# Patient Record
Sex: Female | Born: 1975 | Race: White | Hispanic: No | Marital: Married | Smoking: Never smoker
Health system: Southern US, Community
[De-identification: ages and names within clinical notes are randomized; demographics above are authoritative.]

## PROBLEM LIST (undated history)

## (undated) DIAGNOSIS — Z9889 Other specified postprocedural states: Secondary | ICD-10-CM

## (undated) DIAGNOSIS — K219 Gastro-esophageal reflux disease without esophagitis: Secondary | ICD-10-CM

## (undated) DIAGNOSIS — Z87442 Personal history of urinary calculi: Secondary | ICD-10-CM

## (undated) DIAGNOSIS — R112 Nausea with vomiting, unspecified: Secondary | ICD-10-CM

## (undated) DIAGNOSIS — T8859XA Other complications of anesthesia, initial encounter: Secondary | ICD-10-CM

## (undated) DIAGNOSIS — I10 Essential (primary) hypertension: Secondary | ICD-10-CM

## (undated) HISTORY — PX: BREAST REDUCTION WITH MASTOPEXY: SHX6465

## (undated) HISTORY — PX: CERVICAL CONE BIOPSY: SUR198

## (undated) HISTORY — PX: KIDNEY STONE SURGERY: SHX686

## (undated) HISTORY — PX: BUNIONECTOMY: SHX129

---
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
## 2016-07-24 ENCOUNTER — Other Ambulatory Visit: Payer: Self-pay | Admitting: Family Medicine

## 2016-07-24 DIAGNOSIS — R1013 Epigastric pain: Secondary | ICD-10-CM

## 2016-07-28 ENCOUNTER — Ambulatory Visit
Admission: RE | Admit: 2016-07-28 | Discharge: 2016-07-28 | Disposition: A | Discharge status: Home / Self Care | Payer: BLUE CROSS/BLUE SHIELD | Source: Ambulatory Visit | Attending: Family Medicine | Admitting: Family Medicine

## 2016-07-28 DIAGNOSIS — R1013 Epigastric pain: Secondary | ICD-10-CM

## 2016-07-28 DIAGNOSIS — K7689 Other specified diseases of liver: Secondary | ICD-10-CM | POA: Insufficient documentation

## 2017-08-02 ENCOUNTER — Other Ambulatory Visit: Payer: Self-pay | Admitting: Family Medicine

## 2017-08-02 DIAGNOSIS — Z1231 Encounter for screening mammogram for malignant neoplasm of breast: Secondary | ICD-10-CM

## 2017-08-28 ENCOUNTER — Ambulatory Visit
Admission: RE | Admit: 2017-08-28 | Discharge: 2017-08-28 | Disposition: A | Discharge status: Home / Self Care | Payer: BLUE CROSS/BLUE SHIELD | Source: Ambulatory Visit | Attending: Family Medicine | Admitting: Family Medicine

## 2017-08-28 ENCOUNTER — Encounter (INDEPENDENT_AMBULATORY_CARE_PROVIDER_SITE_OTHER): Payer: Self-pay

## 2017-08-28 ENCOUNTER — Other Ambulatory Visit: Payer: Self-pay | Admitting: Family Medicine

## 2017-08-28 DIAGNOSIS — Z1231 Encounter for screening mammogram for malignant neoplasm of breast: Secondary | ICD-10-CM | POA: Diagnosis present

## 2018-02-15 ENCOUNTER — Other Ambulatory Visit: Payer: Self-pay | Admitting: Certified Nurse Midwife

## 2018-02-15 DIAGNOSIS — Z369 Encounter for antenatal screening, unspecified: Secondary | ICD-10-CM

## 2018-03-11 ENCOUNTER — Ambulatory Visit
Admission: RE | Admit: 2018-03-11 | Discharge: 2018-03-11 | Disposition: A | Discharge status: Home / Self Care | Payer: BLUE CROSS/BLUE SHIELD | Source: Ambulatory Visit | Attending: Obstetrics & Gynecology | Admitting: Obstetrics & Gynecology

## 2018-03-11 ENCOUNTER — Other Ambulatory Visit: Payer: Self-pay | Admitting: *Deleted

## 2018-03-11 ENCOUNTER — Ambulatory Visit
Admission: RE | Admit: 2018-03-11 | Discharge: 2018-03-11 | Disposition: A | Discharge status: Home / Self Care | Payer: BLUE CROSS/BLUE SHIELD | Source: Ambulatory Visit | Attending: Certified Nurse Midwife | Admitting: Certified Nurse Midwife

## 2018-03-11 DIAGNOSIS — O021 Missed abortion: Secondary | ICD-10-CM | POA: Insufficient documentation

## 2018-03-11 DIAGNOSIS — O09511 Supervision of elderly primigravida, first trimester: Secondary | ICD-10-CM

## 2018-03-11 DIAGNOSIS — Z369 Encounter for antenatal screening, unspecified: Secondary | ICD-10-CM

## 2018-03-11 HISTORY — DX: Essential (primary) hypertension: I10

## 2018-03-12 ENCOUNTER — Ambulatory Visit: Payer: BLUE CROSS/BLUE SHIELD | Admitting: Anesthesiology

## 2018-03-12 ENCOUNTER — Other Ambulatory Visit: Payer: Self-pay

## 2018-03-12 ENCOUNTER — Ambulatory Visit
Admission: RE | Admit: 2018-03-12 | Discharge: 2018-03-12 | Disposition: A | Discharge status: Home / Self Care | Payer: BLUE CROSS/BLUE SHIELD | Source: Ambulatory Visit | Attending: Obstetrics and Gynecology | Admitting: Obstetrics and Gynecology

## 2018-03-12 ENCOUNTER — Encounter: Payer: Self-pay | Admitting: Anesthesiology

## 2018-03-12 ENCOUNTER — Encounter
Admission: RE | Disposition: A | Discharge status: Home / Self Care | Payer: Self-pay | Source: Ambulatory Visit | Attending: Obstetrics and Gynecology

## 2018-03-12 DIAGNOSIS — I10 Essential (primary) hypertension: Secondary | ICD-10-CM | POA: Insufficient documentation

## 2018-03-12 DIAGNOSIS — Z79899 Other long term (current) drug therapy: Secondary | ICD-10-CM | POA: Diagnosis not present

## 2018-03-12 DIAGNOSIS — F909 Attention-deficit hyperactivity disorder, unspecified type: Secondary | ICD-10-CM | POA: Insufficient documentation

## 2018-03-12 DIAGNOSIS — O021 Missed abortion: Secondary | ICD-10-CM | POA: Diagnosis present

## 2018-03-12 HISTORY — PX: DILATION AND EVACUATION: SHX1459

## 2018-03-12 LAB — BASIC METABOLIC PANEL
Anion gap: 11 (ref 5–15)
BUN: 22 mg/dL — ABNORMAL HIGH (ref 6–20)
CALCIUM: 9.1 mg/dL (ref 8.9–10.3)
CO2: 20 mmol/L — AB (ref 22–32)
CREATININE: 0.7 mg/dL (ref 0.44–1.00)
Chloride: 104 mmol/L (ref 101–111)
GFR calc Af Amer: >60 mL/min (ref >60)
GLUCOSE: 86 mg/dL (ref 65–99)
Potassium: 4.4 mmol/L (ref 3.5–5.1)
Sodium: 135 mmol/L (ref 135–145)

## 2018-03-12 LAB — CBC
HEMATOCRIT: 35.5 % (ref 35.0–47.0)
Hemoglobin: 12.3 g/dL (ref 12.0–16.0)
MCH: 33.2 pg (ref 26.0–34.0)
MCHC: 34.5 g/dL (ref 32.0–36.0)
MCV: 96.2 fL (ref 80.0–100.0)
Platelets: 253 10*3/uL (ref 150–440)
RBC: 3.69 MIL/uL — ABNORMAL LOW (ref 3.80–5.20)
RDW: 12.1 % (ref 11.5–14.5)
WBC: 6.3 10*3/uL (ref 3.6–11.0)

## 2018-03-12 LAB — TYPE AND SCREEN
ABO/RH(D): O NEG
Antibody Screen: NEGATIVE

## 2018-03-12 SURGERY — DILATION AND EVACUATION, UTERUS
Anesthesia: General | Site: Vagina | Wound class: Clean Contaminated

## 2018-03-12 MED ORDER — PROPOFOL 10 MG/ML IV BOLUS
INTRAVENOUS | Status: AC
  Filled 2018-03-12: qty 20

## 2018-03-12 MED ORDER — LIDOCAINE HCL (CARDIAC) PF 100 MG/5ML IV SOSY
PREFILLED_SYRINGE | INTRAVENOUS | Status: DC | PRN
  Administered 2018-03-12: 40 mg via INTRAVENOUS

## 2018-03-12 MED ORDER — ONDANSETRON HCL 4 MG/2ML IJ SOLN: 4.0000 mg | Freq: Once | INTRAMUSCULAR | Status: DC | PRN

## 2018-03-12 MED ORDER — ONDANSETRON 4 MG PO TBDP: 4.0000 mg | ORAL_TABLET | Freq: Four times a day (QID) | ORAL | 0 refills | Status: DC | PRN

## 2018-03-12 MED ORDER — FENTANYL CITRATE (PF) 100 MCG/2ML IJ SOLN: 25.0000 ug | INTRAMUSCULAR | Status: DC | PRN

## 2018-03-12 MED ORDER — GLYCOPYRROLATE 0.2 MG/ML IJ SOLN
INTRAMUSCULAR | Status: DC | PRN
  Administered 2018-03-12: 0.2 mg via INTRAVENOUS

## 2018-03-12 MED ORDER — LACTATED RINGERS IV SOLN
INTRAVENOUS | Status: DC
  Administered 2018-03-12: 16:00:00 via INTRAVENOUS

## 2018-03-12 MED ORDER — FENTANYL CITRATE (PF) 100 MCG/2ML IJ SOLN
INTRAMUSCULAR | Status: DC | PRN
  Administered 2018-03-12 (×4): 25 ug via INTRAVENOUS

## 2018-03-12 MED ORDER — SCOPOLAMINE 1 MG/3DAYS TD PT72
MEDICATED_PATCH | TRANSDERMAL | Status: AC
  Filled 2018-03-12: qty 1

## 2018-03-12 MED ORDER — IBUPROFEN 800 MG PO TABS: 800.0000 mg | ORAL_TABLET | Freq: Three times a day (TID) | ORAL | 1 refills | Status: DC | PRN

## 2018-03-12 MED ORDER — DOXYCYCLINE HYCLATE 100 MG PO TABS
200.0000 mg | ORAL_TABLET | Freq: Once | ORAL | Status: AC
  Administered 2018-03-12: 100 mg via ORAL
  Filled 2018-03-12: qty 2

## 2018-03-12 MED ORDER — PROPOFOL 10 MG/ML IV BOLUS
INTRAVENOUS | Status: DC | PRN
  Administered 2018-03-12: 50 mg via INTRAVENOUS
  Administered 2018-03-12: 80 mg via INTRAVENOUS

## 2018-03-12 MED ORDER — MIDAZOLAM HCL 2 MG/2ML IJ SOLN
INTRAMUSCULAR | Status: AC
  Filled 2018-03-12: qty 2

## 2018-03-12 MED ORDER — FAMOTIDINE 20 MG PO TABS
20.0000 mg | ORAL_TABLET | Freq: Once | ORAL | Status: AC
  Administered 2018-03-12: 20 mg via ORAL

## 2018-03-12 MED ORDER — MIDAZOLAM HCL 2 MG/2ML IJ SOLN
INTRAMUSCULAR | Status: DC | PRN
  Administered 2018-03-12: 2 mg via INTRAVENOUS

## 2018-03-12 MED ORDER — FAMOTIDINE 20 MG PO TABS
ORAL_TABLET | ORAL | Status: AC
  Administered 2018-03-12: 20 mg via ORAL
  Filled 2018-03-12: qty 1

## 2018-03-12 MED ORDER — RHO D IMMUNE GLOBULIN 1500 UNIT/2ML IJ SOSY
300.0000 ug | PREFILLED_SYRINGE | Freq: Once | INTRAMUSCULAR | Status: AC
  Administered 2018-03-12: 300 ug via INTRAMUSCULAR
  Filled 2018-03-12: qty 2

## 2018-03-12 MED ORDER — ONDANSETRON HCL 4 MG/2ML IJ SOLN
INTRAMUSCULAR | Status: DC | PRN
  Administered 2018-03-12: 4 mg via INTRAVENOUS

## 2018-03-12 MED ORDER — DOCUSATE SODIUM 100 MG PO CAPS: 100.0000 mg | ORAL_CAPSULE | Freq: Two times a day (BID) | ORAL | 0 refills | Status: AC

## 2018-03-12 MED ORDER — SCOPOLAMINE 1 MG/3DAYS TD PT72: 1.0000 | MEDICATED_PATCH | Freq: Once | TRANSDERMAL | Status: DC

## 2018-03-12 SURGICAL SUPPLY — 20 items
CATH ROBINSON RED A/P 16FR (CATHETERS) ×3 IMPLANT
FILTER UTR ASPR SPEC (MISCELLANEOUS) ×1 IMPLANT
FLTR UTR ASPR SPEC (MISCELLANEOUS) ×3
GLOVE BIO SURGEON STRL SZ7 (GLOVE) ×3 IMPLANT
GLOVE INDICATOR 7.5 STRL GRN (GLOVE) ×3 IMPLANT
GOWN STRL REUS W/ TWL LRG LVL3 (GOWN DISPOSABLE) ×2 IMPLANT
GOWN STRL REUS W/TWL LRG LVL3 (GOWN DISPOSABLE) ×4
KIT BERKELEY 1ST TRIMESTER 3/8 (MISCELLANEOUS) ×3 IMPLANT
KIT TURNOVER CYSTO (KITS) ×3 IMPLANT
PACK DNC HYST (MISCELLANEOUS) ×3 IMPLANT
PAD OB MATERNITY 4.3X12.25 (PERSONAL CARE ITEMS) ×3 IMPLANT
PAD PREP 24X41 OB/GYN DISP (PERSONAL CARE ITEMS) ×3 IMPLANT
SET BERKELEY SUCTION TUBING (SUCTIONS) ×3 IMPLANT
TOWEL OR 17X26 4PK STRL BLUE (TOWEL DISPOSABLE) ×3 IMPLANT
VACURETTE 10 RIGID CVD (CANNULA) IMPLANT
VACURETTE 6 ASPIR F TIP BERK (CANNULA) IMPLANT
VACURETTE 7MM F TIP (CANNULA)
VACURETTE 7MM F TIP STRL (CANNULA) IMPLANT
VACURETTE 8 RIGID CVD (CANNULA) ×3 IMPLANT
VACURETTE 8MM F TIP (MISCELLANEOUS) ×3 IMPLANT

## 2018-03-12 NOTE — Interval H&P Note (Signed)
History and Physical Interval Note:  03/12/2018 5:53 PM  Beth Wells  has presented today for surgery, with the diagnosis of spontaneous miscarriage  The various methods of treatment have been discussed with the patient and family. After consideration of risks, benefits and other options for treatment, the patient has consented to  Procedure(s): DILATATION AND EVACUATION (N/A) as a surgical intervention .  The patient's history has been reviewed, patient examined, no change in status, stable for surgery.  I have reviewed the patient's chart and labs.  Questions were answered to the patient's satisfaction.    We will send off products of conception for chromosome analysis   [name]

## 2018-03-12 NOTE — Op Note (Addendum)
Operative Report Suction Dilation and Curettage   Indications: Missed abortion   Pre-operative Diagnosis:  - Missed abortion at 9 weeks - Rh negative  Post-operative Diagnosis: same.  Procedure: 1. Suction D&C  Surgeon: [name]   Assistant(s):  None  Anesthesia: General LMA anesthesia  Estimated Blood Loss:  188m         Intraoperative medications: none         Total IV Fluids: 6086m Urine Output: 0 ml         Specimens: Products of conception, with SNP microarray chromosome analysis by "[facility]" at [facility]         Complications:  None; patient tolerated the procedure well.         Disposition: PACU - hemodynamically stable.         Condition: stable  Findings: Uterus measuring 8 weeks; normal cervix, vagina, perineum.   Indication for procedure/Consents: 4012.o. G1P000 at 11 weeks by LMP and 9 weeks by fetal pole  here for scheduled surgery for spontaneous missed abortion. Risks of surgery were discussed with the patient including but not limited to: bleeding which may require transfusion; infection which may require antibiotics; injury to uterus or surrounding organs; intrauterine scarring which may impair future fertility; need for additional procedures including laparotomy or laparoscopy; and other postoperative/anesthesia complications. Written informed consent was obtained.    Procedure Details:   The patient was then taken to the operating room where general anesthesia was administered and was found to be adequate.  After a formal and adequate timeout was performed, she was placed in the dorsal lithotomy position and examined with the above findings. She was then prepped and draped in the sterile manner.   Her bladder was catheterized for an estimated amount of clear, yellow urine. A speculum was then placed in the patient's vagina and a single tooth tenaculum was applied to the anterior lip of the cervix.    No uterine sounding was performed on this  pregnant uterus. Her cervix was serially dilated to accommodate a 8 sized rigid suction curette.  A sharp curettage was then performed until there was a gritty texture in all four quadrants.  The tenaculum was removed from the anterior lip of the cervix and the vaginal speculum was removed after noting good hemostasis. The patient tolerated the procedure well and was taken to the recovery area awake, extubated and in stable condition.  Pathology received POC in formalin and POC were collected according to the microarray kit directions and [employer] called.  The patient will be discharged to home as per PACU criteria.  She will receive a dose of oral antibiotics prior to discharge. Routine postoperative instructions given.  She was prescribed Percocet, Ibuprofen and Colace.  She will follow up in the clinic in two weeks for postoperative evaluation.

## 2018-03-12 NOTE — Discharge Instructions (Signed)
AMBULATORY SURGERY  DISCHARGE INSTRUCTIONS   1) The drugs that you were given will stay in your system until tomorrow so for the next 24 hours you should not:  A) Drive an automobile B) Make any legal decisions C) Drink any alcoholic beverage   2) You may resume regular meals tomorrow.  Today it is better to start with liquids and gradually work up to solid foods.  You may eat anything you prefer, but it is better to start with liquids, then soup and crackers, and gradually work up to solid foods.   3) Please notify your doctor immediately if you have any unusual bleeding, trouble breathing, redness and pain at the surgery site, drainage, fever, or pain not relieved by medication.    4) Additional Instructions:        Please contact your physician with any problems or Same Day Surgery at [phone], Monday through Friday 6 am to 4 pm, or [place] at [facility] number at [phone].Dilation and Curettage or Vacuum Curettage, Care After  This sheet gives you information about how to care for yourself after your procedure. Your health care provider may also give you more specific instructions. If you have problems or questions, contact your health care provider. What can I expect after the procedure? After your procedure, it is common to have:  Mild pain or cramping.  Some vaginal bleeding or spotting.  These may last for up to 2 weeks after your procedure. Follow these instructions at home: Activity   Do not drive or use heavy machinery while taking prescription pain medicine.  Avoid driving for the first 24 hours after your procedure.  Take frequent, short walks, followed by rest periods, throughout the day. Ask your health care provider what activities are safe for you. After 1-2 days, you may be able to return to your normal activities.  Do not lift anything heavier than 10 lb (4.5 kg) until your health care provider approves.  For at least 2 weeks, or as  long as told by your health care provider, do not: ? Douche. ? Use tampons. ? Have sexual intercourse. General instructions   Take over-the-counter and prescription medicines only as told by your health care provider. This is especially important if you take blood thinning medicine.  Do not take baths, swim, or use a hot tub until your health care provider approves. Take showers instead of baths.  Wear compression stockings as told by your health care provider. These stockings help to prevent blood clots and reduce swelling in your legs.  It is your responsibility to get the results of your procedure. Ask your health care provider, or the department performing the procedure, when your results will be ready.  Keep all follow-up visits as told by your health care provider. This is important. Contact a health care provider if:  You have severe cramps that get worse or that do not get better with medicine.  You have severe abdominal pain.  You cannot drink fluids without vomiting.  You develop pain in a different area of your pelvis.  You have bad-smelling vaginal discharge.  You have a rash. Get help right away if:  You have vaginal bleeding that soaks more than one sanitary pad in 1 hour, for 2 hours in a row.  You pass large blood clots from your vagina.  You have a fever that is above 100.48F (38.0C).  Your abdomen feels very tender or hard.  You have chest pain.  You have shortness  of breath.  You cough up blood.  You feel dizzy or light-headed.  You faint.  You have pain in your neck or shoulder area. This information is not intended to replace advice given to you by your health care provider. Make sure you discuss any questions you have with your health care provider. Document Released: 09/08/2000 Document Revised: 05/10/2016 Document Reviewed: 04/13/2016 Elsevier Interactive Patient Education  [employer].

## 2018-03-12 NOTE — Anesthesia Post-op Follow-up Note (Signed)
Anesthesia QCDR form completed.        

## 2018-03-12 NOTE — Anesthesia Procedure Notes (Signed)
Procedure Name: LMA Insertion Date/Time: 03/12/2018 6:23 PM Performed by: Aline Brochure, CRNA Pre-anesthesia Checklist: Patient identified, Emergency Drugs available, Suction available and Patient being monitored Patient Re-evaluated:Patient Re-evaluated prior to induction Oxygen Delivery Method: Circle system utilized Preoxygenation: Pre-oxygenation with 100% oxygen Induction Type: IV induction Ventilation: Mask ventilation without difficulty LMA: LMA inserted LMA Size: 3.5 Number of attempts: 1 Placement Confirmation: positive ETCO2 and breath sounds checked- equal and bilateral Tube secured with: Tape Dental Injury: Teeth and Oropharynx as per pre-operative assessment

## 2018-03-12 NOTE — Progress Notes (Signed)
Correction, arrived in post-op @2000 .

## 2018-03-12 NOTE — H&P (Signed)
  Ms. Vermette is a 42 y.o. female G1P0000 here for Pre Op Consulting (consult for missed ab)   Pt story: 11wks by 7wk ultrasound, s/p new OB appointment last week with Joycelyn Schmid - hx of cHTN - Hx of ADHD, on adderall - AMA - Unplanned pregnancy on OCPs, was reconciled.   Imaging: Ultrasound with MFM today found 9wk sized CRL without fetal cardiac activity.  Labs: Blood type: O positive  Counseling:     Exam:      Vitals:   03/11/18 1531  BP: (!) 142/90   Body mass index is 18.52 kg/m.  WDWN female in NAD CV: RRR Pulm: CTAB Breast: deferred Neck:  no thyromegaly Abdomen: soft , no mass, normal active bowel sounds,  non-tender, no rebound tenderness Skin: No rashes, ulcers or skin lesions noted. No excessive hirsutism or acne noted.  Neurological: Appears alert and oriented and is a good historian. No gross abnormalities are noted. Psychological: Normal affect and mood. No signs of anxiety or depression noted.   Pelvic:  Deferred  Impression:   The encounter diagnosis was Spontaneous miscarriage.    Plan:   Spontaneous miscarriage: Causes of spontaneous miscarriage discussed with patient, including prevalence, common causes, and the expectation that this event does not increase the chance that she will miscarry again in the future. Emotional support given.  Management options discussed, including: Expectant, medical and surgical.  Pt has opted for: [facility]  Surgical management:  Consents signed today. Risks of surgery were discussed with the patient including but not limited to: bleeding which may require transfusion; infection which may require antibiotics; injury to uterus or surrounding organs; intrauterine scarring which may impair future fertility; need for additional procedures including laparotomy or laparoscopy; and other postoperative/anesthesia complications. Written informed consent was obtained.  This is a scheduled same-day  surgery. She will have a postop visit in 2 weeks to review operative findings and pathology.

## 2018-03-12 NOTE — Anesthesia Preprocedure Evaluation (Addendum)
Anesthesia Evaluation  Patient identified by MRN, date of birth, ID band Patient awake    Reviewed: Allergy & Precautions, NPO status , Patient's Chart, lab work & pertinent test results, reviewed documented beta blocker date and time   Airway Mallampati: II  TM Distance: >3 FB     Dental  (+) Chipped   Pulmonary           Cardiovascular hypertension, Pt. on medications      Neuro/Psych    GI/Hepatic   Endo/Other    Renal/GU      Musculoskeletal   Abdominal   Peds  Hematology   Anesthesia Other Findings Does have some motion sickness. Patch applied. 11 weeks. Has not been nauseated during pregnancy. LMA ok.  Reproductive/Obstetrics                            Anesthesia Physical Anesthesia Plan  ASA: II  Anesthesia Plan: General   Post-op Pain Management:    Induction: Intravenous  PONV Risk Score and Plan:   Airway Management Planned: LMA  Additional Equipment:   Intra-op Plan:   Post-operative Plan:   Informed Consent: I have reviewed the patients History and Physical, chart, labs and discussed the procedure including the risks, benefits and alternatives for the proposed anesthesia with the patient or authorized representative who has indicated his/her understanding and acceptance.     Plan Discussed with: CRNA  Anesthesia Plan Comments:        Anesthesia Quick Evaluation

## 2018-03-12 NOTE — Anesthesia Postprocedure Evaluation (Signed)
Anesthesia Post Note  Patient: Beth Wells  Procedure(s) Performed: DILATATION AND EVACUATION (N/A Vagina )  Patient location during evaluation: PACU Anesthesia Type: General Level of consciousness: awake and alert Pain management: pain level controlled Vital Signs Assessment: post-procedure vital signs reviewed and stable Respiratory status: spontaneous breathing, nonlabored ventilation, respiratory function stable and patient connected to nasal cannula oxygen Cardiovascular status: blood pressure returned to baseline and stable Postop Assessment: no apparent nausea or vomiting Anesthetic complications: no     Last Vitals:  Vitals:   03/12/18 2010 03/12/18 2020  BP: (!) 154/90 (!) 144/88  Pulse: 87 88  Resp:    Temp:  36.7 C  SpO2: 100% 100%    Last Pain:  Vitals:   03/12/18 2020  TempSrc: Temporal  PainSc: 0-No pain                 Edelyn Heidel S

## 2018-03-12 NOTE — Transfer of Care (Signed)
Immediate Anesthesia Transfer of Care Note  Patient: Beth Wells  Procedure(s) Performed: DILATATION AND EVACUATION (N/A Vagina )  Patient Location: PACU  Anesthesia Type:General  Level of Consciousness: sedated  Airway & Oxygen Therapy: Patient connected to face mask oxygen  Post-op Assessment: Post -op Vital signs reviewed and stable  Post vital signs: stable  Last Vitals:  Vitals Value Taken Time  BP 112/78 03/12/2018  7:01 PM  Temp 36.1 C 03/12/2018  7:01 PM  Pulse 83 03/12/2018  7:01 PM  Resp 12 03/12/2018  7:01 PM  SpO2 100 % 03/12/2018  7:01 PM  Vitals shown include unvalidated device data.  Last Pain:  Vitals:   03/12/18 1901  TempSrc: Temporal  PainSc:          Complications: No apparent anesthesia complications

## 2018-03-13 ENCOUNTER — Encounter: Payer: Self-pay | Admitting: Obstetrics and Gynecology

## 2018-03-13 LAB — RHOGAM INJECTION: UNIT DIVISION: 0

## 2018-03-14 LAB — SURGICAL PATHOLOGY

## 2018-03-25 ENCOUNTER — Ambulatory Visit: Payer: BLUE CROSS/BLUE SHIELD

## 2018-04-11 ENCOUNTER — Ambulatory Visit
Admission: RE | Admit: 2018-04-11 | Discharge: 2018-04-11 | Disposition: A | Discharge status: Home / Self Care | Payer: BLUE CROSS/BLUE SHIELD | Source: Ambulatory Visit | Attending: Maternal & Fetal Medicine | Admitting: Maternal & Fetal Medicine

## 2018-04-11 VITALS — BP 123/80 | HR 80 | Temp 98.0°F | Resp 18 | Wt 98.6 lb

## 2018-04-11 DIAGNOSIS — Q999 Chromosomal abnormality, unspecified: Secondary | ICD-10-CM

## 2018-04-11 NOTE — Progress Notes (Addendum)
Referring Physician:  [facility] Time of Consultation: 40 minutes  Ms. 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  This letter is a summary of our visit and may be helpful as questions arise.  The patient was seen in the [facility] on March 11, 2018 for genetic counseling and an ultrasound for advanced maternal age.  At that time, the ultrasound revealed a fetal demise which had occurred at approximately [redacted] weeks gestation.  See the genetic counseling note from that visit regarding family history.  Today we reviewed that there can be many different causes for pregnancy loss.  It is known that approximately 15% of recognized pregnancies will end in miscarriage, most often with no known cause.  Given the abnormal results of the [facility] microarray testing, we would not recommend any other additional evaluation at this time.  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 Dimaria's pregnancy.  In addition to the assessing the copy number of DNA present, this testing can determine the gender of the pregnancy as well as the parent of origin for the extra chromosome.  The results showed a female fetus and that the extra chromosome number 81 was maternal in origin. It is estimated that 90% of cases of trisomy 38 are the result of maternal nondisjunction.  To review, chromosomes are the structures inside each cell of our bodies that have all the instructions for growth and development.  There are usually 23 pairs of chromosomes in each cell, with one member of each pair coming from each parent.  This means that we all have two copies of all of our genetic instructions.   In some pregnancies, there is an extra copy of a chromosome, known as trisomy (three copies instead of the usual two).  Babies with trisomy 77 typically have a severe  combination of birth defects and profound intellectual disabilities.  Many of these babies do not survive until delivery at term and the majority of those who are born pass away during the first year of life.   There is nothing that a parent can do to make this condition happen or to prevent it, including medication use, diet, etc.  The extra chromosome most often (90% of the time) occurs due to a process known as nondisjunction, in which the two parental chromosomes fail to separate properly during the formation of egg or sperm cells.  This results in the presence of an extra copy of the chromosome being passed down from one parent and when combined with the one copy of the chromosome from the other parent, results in a total of three copies of that chromosome.  In rare families, the extra chromosome may be present attached to another chromosome, called a translocation, which can be inherited and may increase the risks to future pregnancies.  To clarify this chance for recurrence in future pregnancies, it may be helpful to have chromosome analysis on the parents of a pregnancy with a chromosome condition.  The chromosome analysis allows examination of the number and structure of the chromosomes, not just the copy number of the DNA as seen on the Anora microarray testing.    If the trisomy 26 occurred as a sporadic event, then we reviewed that the chance for a chromosome problem to occur in a future pregnancy due to this history is estimated to be approximately  1% or the maternal age related risk, whichever is higher. Given the fact that Ms. Channell is currently 42 years old, the estimated chance for a chromosome condition in a future pregnancy would be 1 in 56 in the first trimester or 1 in 80 at term. This means that the chance any baby would not have a chromosome problem is >95%.  If Ms. Dolinski were to have a chromosome translocation, then this risk would be different based upon what chromosomes were involved.   Either way, prenatal diagnosis using ultrasound, cell free fetal DNA screening, CVS and/or amniocentesis is available for any future pregnancy.  We are available to review these testing options again in detail in any future pregnancy.  Plan: Ms. Oyervides plans to speak with her husband and contact her insurance company regarding coverage for chromosome analysis on herself to rule out a balanced chromosome translocation. If she desires chromosome analysis, we are happy to arrange it here or if can be drawn with her labs at her next follow up visit.  We would suggest [facility] test number [id number], Blood chromosome analysis.  We are happy to speak with this family further to arrange for testing or answer questions.  We may be reached at [phone].   Wilburt Finlay, MS, CGC  I was immediately available and supervising. Erasmo Score, MD [facility]

## 2018-08-25 DIAGNOSIS — Z9889 Other specified postprocedural states: Secondary | ICD-10-CM

## 2018-08-25 HISTORY — DX: Other specified postprocedural states: Z98.890

## 2018-12-11 DIAGNOSIS — M79671 Pain in right foot: Secondary | ICD-10-CM | POA: Insufficient documentation

## 2018-12-24 ENCOUNTER — Encounter (HOSPITAL_COMMUNITY): Payer: Self-pay

## 2019-01-03 DIAGNOSIS — G603 Idiopathic progressive neuropathy: Secondary | ICD-10-CM | POA: Insufficient documentation

## 2019-05-30 ENCOUNTER — Other Ambulatory Visit: Payer: Self-pay

## 2019-05-30 ENCOUNTER — Encounter: Payer: Self-pay | Admitting: Emergency Medicine

## 2019-05-30 ENCOUNTER — Emergency Department: Payer: BC Managed Care – PPO

## 2019-05-30 ENCOUNTER — Emergency Department
Admission: EM | Admit: 2019-05-30 | Discharge: 2019-05-31 | Disposition: A | Discharge status: Home / Self Care | Payer: BC Managed Care – PPO | Attending: Emergency Medicine | Admitting: Emergency Medicine

## 2019-05-30 DIAGNOSIS — I10 Essential (primary) hypertension: Secondary | ICD-10-CM | POA: Insufficient documentation

## 2019-05-30 DIAGNOSIS — N83291 Other ovarian cyst, right side: Secondary | ICD-10-CM | POA: Diagnosis not present

## 2019-05-30 DIAGNOSIS — Z79899 Other long term (current) drug therapy: Secondary | ICD-10-CM | POA: Diagnosis not present

## 2019-05-30 DIAGNOSIS — R1032 Left lower quadrant pain: Secondary | ICD-10-CM | POA: Diagnosis present

## 2019-05-30 DIAGNOSIS — N83201 Unspecified ovarian cyst, right side: Secondary | ICD-10-CM

## 2019-05-30 DIAGNOSIS — R1030 Lower abdominal pain, unspecified: Secondary | ICD-10-CM

## 2019-05-30 LAB — COMPREHENSIVE METABOLIC PANEL
ALT: 15 U/L (ref 0–44)
AST: 21 U/L (ref 15–41)
Albumin: 4.5 g/dL (ref 3.5–5.0)
Alkaline Phosphatase: 44 U/L (ref 38–126)
Anion gap: 10 (ref 5–15)
BUN: 29 mg/dL — ABNORMAL HIGH (ref 6–20)
CO2: 26 mmol/L (ref 22–32)
Calcium: 9.5 mg/dL (ref 8.9–10.3)
Chloride: 101 mmol/L (ref 98–111)
Creatinine, Ser: 0.73 mg/dL (ref 0.44–1.00)
GFR calc Af Amer: >60 mL/min (ref >60)
GFR calc non Af Amer: >60 mL/min (ref >60)
Glucose, Bld: 105 mg/dL — ABNORMAL HIGH (ref 70–99)
Potassium: 4.3 mmol/L (ref 3.5–5.1)
Sodium: 137 mmol/L (ref 135–145)
Total Bilirubin: 0.7 mg/dL (ref 0.3–1.2)
Total Protein: 7.9 g/dL (ref 6.5–8.1)

## 2019-05-30 LAB — CBC
HCT: 42.5 % (ref 36.0–46.0)
Hemoglobin: 14 g/dL (ref 12.0–15.0)
MCH: 30.4 pg (ref 26.0–34.0)
MCHC: 32.9 g/dL (ref 30.0–36.0)
MCV: 92.4 fL (ref 80.0–100.0)
Platelets: 286 10*3/uL (ref 150–400)
RBC: 4.6 MIL/uL (ref 3.87–5.11)
RDW: 12.7 % (ref 11.5–15.5)
WBC: 13.8 10*3/uL — ABNORMAL HIGH (ref 4.0–10.5)
nRBC: 0 % (ref 0.0–0.2)

## 2019-05-30 LAB — URINALYSIS, COMPLETE (UACMP) WITH MICROSCOPIC
Bilirubin Urine: NEGATIVE
Glucose, UA: NEGATIVE mg/dL
Hgb urine dipstick: NEGATIVE
Ketones, ur: 20 mg/dL — AB
Nitrite: NEGATIVE
Protein, ur: NEGATIVE mg/dL
Specific Gravity, Urine: 1.029 (ref 1.005–1.030)
pH: 6 (ref 5.0–8.0)

## 2019-05-30 LAB — PREGNANCY, URINE: Preg Test, Ur: NEGATIVE

## 2019-05-30 MED ORDER — IOHEXOL 240 MG/ML SOLN
50.0000 mL | Freq: Once | INTRAMUSCULAR | Status: AC | PRN
  Administered 2019-05-30: 50 mL via ORAL

## 2019-05-30 MED ORDER — IOHEXOL 300 MG/ML  SOLN
100.0000 mL | Freq: Once | INTRAMUSCULAR | Status: AC | PRN
  Administered 2019-05-30: 21:00:00 100 mL via INTRAVENOUS

## 2019-05-30 NOTE — ED Triage Notes (Signed)
Pt arrives with no complaints of pain but does report a sudden onset of sharp LLQ pain that lasted "for awhile" PT reports a loss of consciousness after pain episode. Fall was witness and pt did not hit her head.

## 2019-05-30 NOTE — ED Provider Notes (Addendum)
[facility] Emergency Department Provider Note  Time seen: 6:15 PM  I have reviewed the triage vital signs and the nursing notes.   HISTORY  Chief Complaint Abdominal Pain   HPI Beth Wells is a 43 y.o. female with a past medical history of hypertension presents to the emergency department for left lower quadrant abdominal pain.  According to the patient around 12 PM today she had acute onset of left lower quadrant abdominal pain.  States an episode of near syncope with the pain.  Denies any vomiting denies any diarrhea.  Denies any recent dysuria or hematuria.  Last period ended approximately 1 week ago.  Denies any fever cough congestion or shortness of breath.  Continues to have left lower quad abdominal pain states mild unless it is palpated and moderate.   Past Medical History:  Diagnosis Date  . Hypertension     Patient Active Problem List   Diagnosis Date Noted  . Chromosomal abnormality     Past Surgical History:  Procedure Laterality Date  . CERVICAL CONE BIOPSY    . DILATION AND EVACUATION N/A 03/12/2018   Procedure: DILATATION AND EVACUATION;  Surgeon: [name], MD;  Location: [facility] ORS;  Service: Gynecology;  Laterality: N/A;  . KIDNEY STONE SURGERY      Prior to Admission medications   Medication Sig Start Date End Date Taking? Authorizing Provider  amphetamine-dextroamphetamine (ADDERALL XR) 20 MG 24 hr capsule Take 20 mg by mouth daily.    [provider]  ibuprofen (ADVIL,MOTRIN) 800 MG tablet Take 1 tablet (800 mg total) by mouth every 8 (eight) hours as needed for moderate pain or cramping. 03/12/18   [name], MD  labetalol (NORMODYNE) 300 MG tablet Take 300 mg by mouth 2 (two) times daily.    [provider]  lisinopril (PRINIVIL,ZESTRIL) 5 MG tablet Take 5 mg by mouth daily.    [provider]  ondansetron (ZOFRAN ODT) 4 MG disintegrating tablet Take 1 tablet (4 mg total) by mouth every 6  (six) hours as needed for nausea. Patient not taking: Reported on 04/11/2018 03/12/18   [name], MD  Prenatal Vit-Fe Fumarate-FA (PRENATAL MULTIVITAMIN) TABS tablet Take 1 tablet by mouth daily at 12 noon.    [provider]    No Known Allergies  Family History  Problem Relation Age of Onset  . Breast cancer Neg Hx     Social History Social History   Tobacco Use  . Smoking status: Never Smoker  . Smokeless tobacco: Never Used  Substance Use Topics  . Alcohol use: Not Currently  . Drug use: Not Currently    Review of Systems Constitutional: Negative for fever. Cardiovascular: Negative for chest pain. Respiratory: Negative for shortness of breath. Gastrointestinal: Left lower quadrant abdominal pain.  Negative for nausea vomiting or diarrhea Genitourinary: Negative for urinary compaints Musculoskeletal: Negative for musculoskeletal complaints Skin: Negative for skin complaints  Neurological: Negative for headache All other ROS negative  ____________________________________________   PHYSICAL EXAM:  VITAL SIGNS: ED Triage Vitals  Enc Vitals Group     BP 05/30/19 1350 128/86     Pulse Rate 05/30/19 1350 81     Resp 05/30/19 1350 16     Temp 05/30/19 1351 97.9 F (36.6 C)     Temp Source 05/30/19 1351 Oral     SpO2 05/30/19 1350 100 %     Weight 05/30/19 1351 99 lb 3.3 oz (45 kg)     Height 05/30/19 1351 5\' 2"  (  1.575 m)     Head Circumference --      Peak Flow --      Pain Score 05/30/19 1350 0     Pain Loc --      Pain Edu? --      Excl. in [place]? --    Constitutional: Alert and oriented. Well appearing and in no distress. Eyes: Normal exam ENT      Head: Normocephalic and atraumatic.      Mouth/Throat: Mucous membranes are moist. Cardiovascular: Normal rate, regular rhythm. No murmur Respiratory: Normal respiratory effort without tachypnea nor retractions. Breath sounds are clear Gastrointestinal: Soft and nontender. No distention.   Musculoskeletal: Nontender with normal range of motion in all extremities. Neurologic:  Normal speech and language. No gross focal neurologic deficits  Skin:  Skin is warm, dry and intact.  Psychiatric: Mood and affect are normal.   ____________________________________________  EKG EKG viewed and interpreted by myself shows a normal sinus rhythm at 78 bpm with a narrow QRS, normal axis, normal intervals, no concerning ST changes.  RADIOLOGY  CT is resulted showing a 5 cm right ovarian cyst.  Pelvic ultrasound recommended.  Left corpus luteal cyst.  Small amount of free fluid in the endometrial canal.  No diverticulitis.  ____________________________________________   INITIAL IMPRESSION / ASSESSMENT AND PLAN / ED COURSE  Pertinent labs & imaging results that were available during my care of the patient were reviewed by me and considered in my medical decision making (see chart for details).   Patient presents to the emergency department for left lower quadrant abdominal pain.  Differential this time would include diverticulitis, colitis, ureterolithiasis, pyelonephritis, urinary tract infection ovarian cyst or hemorrhagic cyst.  Patient's lab work shows a moderate leukocytosis of 13,000, urine is equivocal we will send a urine culture.  CT scan pending at this time.  CT scan shows 5 cm probable right ovarian cyst.  Given the patient's pain although pain is much reduced at this time we will proceed with ultrasound to further evaluate, characterize and ensure proper blood flow.  IMPRESSION:  7.3 x 4.3 x 4.2 cm cystic mass within the right ovary with low level  internal echoes. Appearance is most suggestive of endometrioma. No  evidence of torsion.   Ultrasound shows 7 cm cyst.  No evidence of torsion.  Patient states no pain at this time.  Dr. Leafy Ro has been consulted, will take the patient to the operating room next week most likely.  I discussed strict return precautions for return  of significant pain otherwise will discharge with a short course of pain medication for mild pain at home.  Patient agreeable to plan.  [name] was evaluated in Emergency Department on 05/30/2019 for the symptoms described in the history of present illness. She was evaluated in the context of the global COVID-19 pandemic, which necessitated consideration that the patient might be at risk for infection with the SARS-CoV-2 virus that causes COVID-19. Institutional protocols and algorithms that pertain to the evaluation of patients at risk for COVID-19 are in a state of rapid change based on information released by regulatory bodies including the CDC and federal and state organizations. These policies and algorithms were followed during the patient's care in the ED.  ____________________________________________   FINAL CLINICAL IMPRESSION(S) / ED DIAGNOSES  Abdominal pain Ovarian cyst   Harvest Dark, MD 05/30/19 2300    Harvest Dark, MD 05/31/19 0009

## 2019-05-31 MED ORDER — HYDROCODONE-ACETAMINOPHEN 5-325 MG PO TABS: 1.0000 | ORAL_TABLET | ORAL | 0 refills | Status: DC | PRN

## 2019-06-05 ENCOUNTER — Other Ambulatory Visit: Payer: Self-pay

## 2019-06-05 ENCOUNTER — Other Ambulatory Visit
Admission: RE | Admit: 2019-06-05 | Discharge: 2019-06-05 | Disposition: A | Discharge status: Home / Self Care | Payer: BC Managed Care – PPO | Source: Ambulatory Visit | Attending: Obstetrics and Gynecology | Admitting: Obstetrics and Gynecology

## 2019-06-05 DIAGNOSIS — Z01812 Encounter for preprocedural laboratory examination: Secondary | ICD-10-CM | POA: Diagnosis not present

## 2019-06-05 DIAGNOSIS — Z20828 Contact with and (suspected) exposure to other viral communicable diseases: Secondary | ICD-10-CM | POA: Diagnosis present

## 2019-06-05 LAB — SARS CORONAVIRUS 2 (TAT 6-24 HRS): SARS Coronavirus 2: NEGATIVE

## 2019-06-05 NOTE — H&P (Signed)
Beth Wells is a 43 y.o. female presenting with Follow-up (ER follow up, cyst on ovary, surgery consult)  History of Present Illness: -Patient is following up after her ED visit shown below. She is consulting for a ovarian cystectomy today. She is having more pain when she starts her period. - Pain now totally resolved, lasted about 15 minutes, triggering near syncope and pelvic pain  05/30/2019 ED Visit for LLQ Pain, Cyst of right ovary: -Patient presented to the ED with LLQ and an episode of near syncope  -Ultrasound showed: 7.3 x 4.3 x 4.2 cm cystic mass within the right ovary with low level internal echoes. Appearance is most suggestive of endometrioma. No evidence of torsion.   Past Medical History:  has a past medical history of Depression, Hypertension, Ovarian cyst, PONV (postoperative nausea and vomiting), and Renal calculus or stone.  Past Surgical History:  has a past surgical history that includes Lithotripsy Eswl (aug 2015); ureteroscopic stone removal (Dec 2015); Cervical cone biopsy (07/26/2014); Dilation and curettage of uterus; bunion correction (Right, 08/29/2018); bunion correction (Right, 08/29/2018); repair flexor tendon/posterior tibialis foot w/o free graft (Right, 08/29/2018); injection anesthetic agent other nerve or branch (Right, 08/29/2018); and intraoperative flouroscopy (Right, 08/29/2018). Family History: family history includes Coronary Artery Disease (Blocked arteries around heart) in her maternal grandfather and maternal grandmother; Depression in her mother; High blood pressure (Hypertension) in her father and mother; Hyperlipidemia (Elevated cholesterol) in her father; Thyroid disease in her maternal grandmother. Social History:  reports that she has never smoked. She has never used smokeless tobacco. She reports current alcohol use of about 1.0 standard drinks of alcohol per week. She reports that she does not use drugs. OB/GYN History:          OB History     Gravida  1   Para  0   Term  0   Preterm  0   AB  1   Living  0     SAB  1   TAB  0   Ectopic  0   Molar  0   Multiple  0   Live Births  0        Allergies: has No Known Allergies. Medications:  Current Outpatient Medications:  .  ascorbic acid, vitamin C, (VITAMIN C) 1000 MG tablet, Take 1 tablet (1,000 mg total) by mouth 2 (two) times daily (Patient taking differently: Take 1,000 mg by mouth once daily   ), Disp: 60 tablet, Rfl: 2 .  DULoxetine (CYMBALTA) 20 MG DR capsule, Take 2 capsules (40 mg total) by mouth once daily, Disp: 180 capsule, Rfl: 3 .  Lactobac no.41/Bifidobact no.7 (PROBIOTIC-10 ORAL), Take by mouth, Disp: , Rfl:  .  lisinopriL (ZESTRIL) 2.5 MG tablet, Take 1 tablet (2.5 mg total) by mouth once daily, Disp: 90 tablet, Rfl: 3 .  dextroamphetamine-amphetamine (ADDERALL XR) 20 MG XR capsule, Take 1 capsule (20 mg total) by mouth once daily for 29 days, Disp: 30 capsule, Rfl: 0 .  dextroamphetamine-amphetamine (ADDERALL XR) 20 MG XR capsule, Take 1 capsule (20 mg total) by mouth once daily for 29 days, Disp: 30 capsule, Rfl: 0 .  gabapentin (NEURONTIN) 300 MG capsule, Take 1 capsule (300 mg total) by mouth 2 (two) times daily for 90 days (Patient taking differently: Take 600 mg by mouth nightly   ), Disp: 180 capsule, Rfl: 3  Review of Systems: No SOB, no palpitations or chest pain, no new lower extremity edema, no nausea or vomiting or  bowel or bladder complaints. See HPI for gyn specific ROS.   Exam:   BP 112/80   Ht 154.9 cm (5\' 1" )   Wt 46.8 kg (103 lb 3.2 oz)   LMP 05/17/2019 (Exact Date)   BMI 19.50 kg/m   Constitutional:  General appearance: Well nourished, well developed female in no acute distress.  CV: RRR Pulm: CTAB  Neuro/psych:  Normal mood and affect. No gross motor deficits. Neck:  Supple, normal appearance.  Respiratory:  Normal respiratory effort, no use of accessory muscles Skin:  No visible rashes or external  lesions  Abdomen: soft, no masses, normal active bowel sounds,  +tender at LLQ, no rebound tenderness, no hernias noted  Impression:   The encounter diagnosis was Cyst of right ovary.  Plan:   1. Ovarian Cystic Mass of Right Ovary, 7 cm -Discussed with patient that due to the size of her cyst, I would recommend ovarian cystectomy (Right side per ultrasound) though expectant management with repeat tvus in 3 months and ovarian suppression between now and then is also reasonable.  - We had a preoperative discussion regarding her plans to proceed with surgical treatment of her Right Ovarian Cyst by Ovarian Cystectomy procedure.The patient and I discussed the technical aspects of the procedure including the potential for risks and complications.These include but are not limited to the risk of infection requiring post-operative antibiotics or further procedures. We talked about the risk of injury to adjacent organs including bladder, bowel, ureter, blood vessels or nerves.We talked about the need to convert to an open incision. We talked about the possible need for blood transfusion. We talked aboutpostop complications such asthromboembolic or cardiopulmonary complications. All of her questions were answered.    2. LLQ Pain -Discussed with patient birth control options to help treat with stabilizing cycle and relieving pain symptoms; she will think about this & we will give her a handout

## 2019-06-06 ENCOUNTER — Observation Stay
Admission: RE | Admit: 2019-06-06 | Discharge: 2019-06-07 | Disposition: A | Discharge status: Home / Self Care | Payer: BC Managed Care – PPO | Attending: Obstetrics and Gynecology | Admitting: Obstetrics and Gynecology

## 2019-06-06 ENCOUNTER — Encounter: Payer: Self-pay | Admitting: Anesthesiology

## 2019-06-06 DIAGNOSIS — I10 Essential (primary) hypertension: Secondary | ICD-10-CM | POA: Insufficient documentation

## 2019-06-06 DIAGNOSIS — N801 Endometriosis of ovary: Secondary | ICD-10-CM | POA: Diagnosis not present

## 2019-06-06 DIAGNOSIS — Z79899 Other long term (current) drug therapy: Secondary | ICD-10-CM | POA: Insufficient documentation

## 2019-06-06 DIAGNOSIS — F329 Major depressive disorder, single episode, unspecified: Secondary | ICD-10-CM | POA: Diagnosis not present

## 2019-06-06 DIAGNOSIS — N803 Endometriosis of pelvic peritoneum: Secondary | ICD-10-CM | POA: Insufficient documentation

## 2019-06-06 DIAGNOSIS — K660 Peritoneal adhesions (postprocedural) (postinfection): Secondary | ICD-10-CM | POA: Diagnosis not present

## 2019-06-06 DIAGNOSIS — N83201 Unspecified ovarian cyst, right side: Secondary | ICD-10-CM | POA: Diagnosis present

## 2019-06-06 DIAGNOSIS — N83511 Torsion of right ovary and ovarian pedicle: Secondary | ICD-10-CM | POA: Diagnosis present

## 2019-06-06 LAB — CBC
HCT: 39.2 % (ref 36.0–46.0)
Hemoglobin: 13.1 g/dL (ref 12.0–15.0)
MCH: 30.6 pg (ref 26.0–34.0)
MCHC: 33.4 g/dL (ref 30.0–36.0)
MCV: 91.6 fL (ref 80.0–100.0)
Platelets: 276 10*3/uL (ref 150–400)
RBC: 4.28 MIL/uL (ref 3.87–5.11)
RDW: 12.5 % (ref 11.5–15.5)
WBC: 6.2 10*3/uL (ref 4.0–10.5)
nRBC: 0 % (ref 0.0–0.2)

## 2019-06-06 LAB — BASIC METABOLIC PANEL
Anion gap: 5 (ref 5–15)
BUN: 29 mg/dL — ABNORMAL HIGH (ref 6–20)
CO2: 25 mmol/L (ref 22–32)
Calcium: 9 mg/dL (ref 8.9–10.3)
Chloride: 106 mmol/L (ref 98–111)
Creatinine, Ser: 0.77 mg/dL (ref 0.44–1.00)
GFR calc Af Amer: >60 mL/min (ref >60)
GFR calc non Af Amer: >60 mL/min (ref >60)
Glucose, Bld: 87 mg/dL (ref 70–99)
Potassium: 3.7 mmol/L (ref 3.5–5.1)
Sodium: 136 mmol/L (ref 135–145)

## 2019-06-06 LAB — TYPE AND SCREEN
ABO/RH(D): O NEG
Antibody Screen: NEGATIVE

## 2019-06-06 MED ORDER — LIDOCAINE HCL (PF) 2 % IJ SOLN
INTRAMUSCULAR | Status: AC
  Filled 2019-06-06: qty 10

## 2019-06-06 MED ORDER — LACTATED RINGERS IV SOLN: INTRAVENOUS | Status: DC

## 2019-06-06 MED ORDER — ACETAMINOPHEN 500 MG PO TABS
1000.0000 mg | ORAL_TABLET | ORAL | Status: AC
  Administered 2019-06-06: 14:00:00 1000 mg via ORAL

## 2019-06-06 MED ORDER — ACETAMINOPHEN 500 MG PO TABS
ORAL_TABLET | ORAL | Status: AC
  Administered 2019-06-06: 14:00:00 1000 mg via ORAL
  Filled 2019-06-06: qty 2

## 2019-06-06 MED ORDER — ACETAMINOPHEN 325 MG PO TABS: 650.0000 mg | ORAL_TABLET | ORAL | Status: DC | PRN

## 2019-06-06 MED ORDER — GABAPENTIN 300 MG PO CAPS: 300.0000 mg | ORAL_CAPSULE | ORAL | Status: DC

## 2019-06-06 MED ORDER — ACETAMINOPHEN 500 MG PO TABS: 1000.0000 mg | ORAL_TABLET | ORAL | Status: DC

## 2019-06-06 MED ORDER — MIDAZOLAM HCL 2 MG/2ML IJ SOLN
INTRAMUSCULAR | Status: AC
  Filled 2019-06-06: qty 2

## 2019-06-06 MED ORDER — ROCURONIUM BROMIDE 50 MG/5ML IV SOLN
INTRAVENOUS | Status: AC
  Filled 2019-06-06: qty 1

## 2019-06-06 MED ORDER — LACTATED RINGERS IV SOLN
INTRAVENOUS | Status: DC
  Administered 2019-06-06: 14:00:00 via INTRAVENOUS

## 2019-06-06 MED ORDER — BUPIVACAINE HCL (PF) 0.5 % IJ SOLN
INTRAMUSCULAR | Status: AC
  Filled 2019-06-06: qty 30

## 2019-06-06 MED ORDER — GABAPENTIN 300 MG PO CAPS
600.0000 mg | ORAL_CAPSULE | Freq: Every day | ORAL | Status: DC
  Administered 2019-06-06: 600 mg via ORAL
  Filled 2019-06-06: qty 2

## 2019-06-06 MED ORDER — GABAPENTIN 300 MG PO CAPS
ORAL_CAPSULE | ORAL | Status: AC
  Administered 2019-06-06: 300 mg via ORAL
  Filled 2019-06-06: qty 1

## 2019-06-06 MED ORDER — FENTANYL CITRATE (PF) 100 MCG/2ML IJ SOLN
INTRAMUSCULAR | Status: AC
  Filled 2019-06-06: qty 2

## 2019-06-06 MED ORDER — GABAPENTIN 300 MG PO CAPS
300.0000 mg | ORAL_CAPSULE | ORAL | Status: AC
  Administered 2019-06-06: 14:00:00 300 mg via ORAL

## 2019-06-06 MED ORDER — PROPOFOL 10 MG/ML IV BOLUS
INTRAVENOUS | Status: AC
  Filled 2019-06-06: qty 20

## 2019-06-06 MED ORDER — EPHEDRINE SULFATE 50 MG/ML IJ SOLN
INTRAMUSCULAR | Status: AC
  Filled 2019-06-06: qty 1

## 2019-06-06 MED ORDER — LISINOPRIL 2.5 MG PO TABS
2.5000 mg | ORAL_TABLET | Freq: Every day | ORAL | Status: DC
  Administered 2019-06-06: 21:00:00 2.5 mg via ORAL
  Filled 2019-06-06 (×3): qty 1

## 2019-06-06 MED ORDER — DULOXETINE HCL 20 MG PO CPEP
40.0000 mg | ORAL_CAPSULE | Freq: Every day | ORAL | Status: DC
  Administered 2019-06-06: 40 mg via ORAL
  Filled 2019-06-06 (×3): qty 2

## 2019-06-06 NOTE — Interval H&P Note (Signed)
History and Physical Interval Note:  06/06/2019 3:11 PM  Beth Wells  has presented today for surgery, with the diagnosis of Ovarian cyst, suspected endometrioma.  The various methods of treatment have been discussed with the patient and family. After consideration of risks, benefits and other options for treatment, the patient has consented to  Procedure(s): LAPAROSCOPY DIAGNOSTIC,PERITANEAL BIOPSIES (N/A) LAPAROSCOPIC OVARIAN CYSTECTOMY (Right) as a surgical intervention.  The patient's history has been reviewed, patient examined, no change in status, stable for surgery.  I have reviewed the patient's chart and labs.  Questions were answered to the patient's satisfaction.     [name]

## 2019-06-06 NOTE — Anesthesia Preprocedure Evaluation (Addendum)
Anesthesia Evaluation  Patient identified by MRN, date of birth, ID band Patient awake    Reviewed: Allergy & Precautions, H&P , NPO status , Patient's Chart, lab work & pertinent test results, reviewed documented beta blocker date and time   Airway Mallampati: II  TM Distance: >3 FB Neck ROM: full    Dental  (+) Chipped   Pulmonary neg pulmonary ROS,    Pulmonary exam normal        Cardiovascular hypertension, Pt. on medications negative cardio ROS Normal cardiovascular exam     Neuro/Psych negative neurological ROS  negative psych ROS   GI/Hepatic negative GI ROS, Neg liver ROS,   Endo/Other  negative endocrine ROS  Renal/GU negative Renal ROS  Female GU complaint     Musculoskeletal negative musculoskeletal ROS (+)   Abdominal   Peds negative pediatric ROS (+)  Hematology negative hematology ROS (+)   Anesthesia Other Findings Past Medical History: 08/2018: H/O foot surgery No date: Hypertension  Reproductive/Obstetrics                           Anesthesia Physical  Anesthesia Plan  ASA: II and emergent  Anesthesia Plan: General ETT   Post-op Pain Management:    Induction: Intravenous  PONV Risk Score and Plan: 3 and Ondansetron, Dexamethasone, Midazolam, Scopolamine patch - Pre-op and Treatment may vary due to age or medical condition  Airway Management Planned: Oral ETT  Additional Equipment:   Intra-op Plan:   Post-operative Plan: Extubation in OR  Informed Consent: I have reviewed the patients History and Physical, chart, labs and discussed the procedure including the risks, benefits and alternatives for the proposed anesthesia with the patient or authorized representative who has indicated his/her understanding and acceptance.     Dental advisory given  Plan Discussed with: CRNA  Anesthesia Plan Comments:       Anesthesia Quick Evaluation

## 2019-06-06 NOTE — Progress Notes (Addendum)
Pt. Transported to room 347 at 1950 and bedside report given to [place]. Pt. In NAD with VSS and IV infusing.

## 2019-06-07 ENCOUNTER — Ambulatory Visit: Payer: BC Managed Care – PPO | Admitting: Anesthesiology

## 2019-06-07 ENCOUNTER — Encounter
Admission: RE | Disposition: A | Discharge status: Home / Self Care | Payer: Self-pay | Source: Home / Self Care | Attending: Obstetrics and Gynecology

## 2019-06-07 DIAGNOSIS — N83201 Unspecified ovarian cyst, right side: Secondary | ICD-10-CM | POA: Diagnosis not present

## 2019-06-07 HISTORY — PX: LAPAROSCOPY: SHX197

## 2019-06-07 HISTORY — PX: LAPAROSCOPIC OVARIAN CYSTECTOMY: SHX6248

## 2019-06-07 LAB — BASIC METABOLIC PANEL
Anion gap: 9 (ref 5–15)
BUN: 16 mg/dL (ref 6–20)
CO2: 22 mmol/L (ref 22–32)
Calcium: 8.3 mg/dL — ABNORMAL LOW (ref 8.9–10.3)
Chloride: 106 mmol/L (ref 98–111)
Creatinine, Ser: 0.84 mg/dL (ref 0.44–1.00)
GFR calc Af Amer: >60 mL/min (ref >60)
GFR calc non Af Amer: >60 mL/min (ref >60)
Glucose, Bld: 113 mg/dL — ABNORMAL HIGH (ref 70–99)
Potassium: 4.2 mmol/L (ref 3.5–5.1)
Sodium: 137 mmol/L (ref 135–145)

## 2019-06-07 LAB — CBC
HCT: 39 % (ref 36.0–46.0)
Hemoglobin: 12.8 g/dL (ref 12.0–15.0)
MCH: 30.7 pg (ref 26.0–34.0)
MCHC: 32.8 g/dL (ref 30.0–36.0)
MCV: 93.5 fL (ref 80.0–100.0)
Platelets: 214 10*3/uL (ref 150–400)
RBC: 4.17 MIL/uL (ref 3.87–5.11)
RDW: 12.4 % (ref 11.5–15.5)
WBC: 11.2 10*3/uL — ABNORMAL HIGH (ref 4.0–10.5)
nRBC: 0 % (ref 0.0–0.2)

## 2019-06-07 SURGERY — LAPAROSCOPY, DIAGNOSTIC
Anesthesia: General | Site: Abdomen | Laterality: Right

## 2019-06-07 MED ORDER — GABAPENTIN 300 MG PO CAPS
300.0000 mg | ORAL_CAPSULE | ORAL | Status: AC
  Administered 2019-06-07: 300 mg via ORAL
  Filled 2019-06-07: qty 1

## 2019-06-07 MED ORDER — LACTATED RINGERS IV SOLN
INTRAVENOUS | Status: DC | PRN
  Administered 2019-06-07 (×2): via INTRAVENOUS

## 2019-06-07 MED ORDER — BUPIVACAINE HCL (PF) 0.5 % IJ SOLN
INTRAMUSCULAR | Status: AC
  Filled 2019-06-07: qty 30

## 2019-06-07 MED ORDER — BUPIVACAINE HCL 0.5 % IJ SOLN
INTRAMUSCULAR | Status: DC | PRN
  Administered 2019-06-07: 15 mL

## 2019-06-07 MED ORDER — PROMETHAZINE HCL 25 MG/ML IJ SOLN
INTRAMUSCULAR | Status: AC
  Filled 2019-06-07: qty 1

## 2019-06-07 MED ORDER — CELECOXIB 200 MG PO CAPS
200.0000 mg | ORAL_CAPSULE | Freq: Two times a day (BID) | ORAL | Status: DC
  Filled 2019-06-07: qty 1

## 2019-06-07 MED ORDER — ONDANSETRON HCL 4 MG/2ML IJ SOLN: 4.0000 mg | Freq: Once | INTRAMUSCULAR | Status: DC | PRN

## 2019-06-07 MED ORDER — SUCCINYLCHOLINE CHLORIDE 20 MG/ML IJ SOLN
INTRAMUSCULAR | Status: AC
  Filled 2019-06-07: qty 1

## 2019-06-07 MED ORDER — PROPOFOL 500 MG/50ML IV EMUL
INTRAVENOUS | Status: AC
  Filled 2019-06-07: qty 50

## 2019-06-07 MED ORDER — FENTANYL CITRATE (PF) 100 MCG/2ML IJ SOLN
INTRAMUSCULAR | Status: DC | PRN
  Administered 2019-06-07: 50 ug via INTRAVENOUS

## 2019-06-07 MED ORDER — PHENYLEPHRINE HCL (PRESSORS) 10 MG/ML IV SOLN
INTRAVENOUS | Status: DC | PRN
  Administered 2019-06-07: 100 ug via INTRAVENOUS

## 2019-06-07 MED ORDER — PHENYLEPHRINE HCL (PRESSORS) 10 MG/ML IV SOLN
INTRAVENOUS | Status: AC
  Filled 2019-06-07: qty 1

## 2019-06-07 MED ORDER — MIDAZOLAM HCL 2 MG/2ML IJ SOLN
INTRAMUSCULAR | Status: AC
  Filled 2019-06-07: qty 2

## 2019-06-07 MED ORDER — OXYCODONE HCL 5 MG PO TABS: 5.0000 mg | ORAL_TABLET | ORAL | Status: DC | PRN

## 2019-06-07 MED ORDER — OXYCODONE HCL 5 MG PO CAPS: 5.0000 mg | ORAL_CAPSULE | Freq: Four times a day (QID) | ORAL | 0 refills | Status: DC | PRN

## 2019-06-07 MED ORDER — SODIUM CHLORIDE 0.9 % IV BOLUS
500.0000 mL | Freq: Once | INTRAVENOUS | Status: AC
  Administered 2019-06-07: 500 mL via INTRAVENOUS

## 2019-06-07 MED ORDER — PROPOFOL 10 MG/ML IV BOLUS
INTRAVENOUS | Status: AC
  Filled 2019-06-07: qty 20

## 2019-06-07 MED ORDER — GABAPENTIN 800 MG PO TABS: 800.0000 mg | ORAL_TABLET | Freq: Every day | ORAL | 0 refills | Status: DC

## 2019-06-07 MED ORDER — PROPOFOL 500 MG/50ML IV EMUL
INTRAVENOUS | Status: DC | PRN
  Administered 2019-06-07: 75 ug/kg/min via INTRAVENOUS

## 2019-06-07 MED ORDER — ACETAMINOPHEN 500 MG PO TABS
1000.0000 mg | ORAL_TABLET | Freq: Four times a day (QID) | ORAL | Status: DC
  Administered 2019-06-07: 1000 mg via ORAL
  Filled 2019-06-07: qty 2

## 2019-06-07 MED ORDER — ACETAMINOPHEN 500 MG PO TABS
1000.0000 mg | ORAL_TABLET | ORAL | Status: AC
  Administered 2019-06-07: 09:00:00 1000 mg via ORAL
  Filled 2019-06-07: qty 2

## 2019-06-07 MED ORDER — GABAPENTIN 300 MG PO CAPS: 900.0000 mg | ORAL_CAPSULE | Freq: Every day | ORAL | Status: DC

## 2019-06-07 MED ORDER — SUGAMMADEX SODIUM 200 MG/2ML IV SOLN
INTRAVENOUS | Status: AC
  Filled 2019-06-07: qty 2

## 2019-06-07 MED ORDER — IBUPROFEN 800 MG PO TABS: 800.0000 mg | ORAL_TABLET | Freq: Three times a day (TID) | ORAL | 1 refills | Status: AC

## 2019-06-07 MED ORDER — DOCUSATE SODIUM 100 MG PO CAPS: 100.0000 mg | ORAL_CAPSULE | Freq: Two times a day (BID) | ORAL | 0 refills | Status: DC

## 2019-06-07 MED ORDER — FENTANYL CITRATE (PF) 100 MCG/2ML IJ SOLN
25.0000 ug | INTRAMUSCULAR | Status: DC | PRN
  Administered 2019-06-07 (×3): 25 ug via INTRAVENOUS

## 2019-06-07 MED ORDER — ACETAMINOPHEN 10 MG/ML IV SOLN
INTRAVENOUS | Status: AC
  Filled 2019-06-07: qty 100

## 2019-06-07 MED ORDER — DEXAMETHASONE SODIUM PHOSPHATE 10 MG/ML IJ SOLN
INTRAMUSCULAR | Status: AC
  Filled 2019-06-07: qty 1

## 2019-06-07 MED ORDER — ACETAMINOPHEN 500 MG PO TABS: 1000.0000 mg | ORAL_TABLET | Freq: Four times a day (QID) | ORAL | 0 refills | Status: AC

## 2019-06-07 MED ORDER — EPHEDRINE SULFATE 50 MG/ML IJ SOLN
INTRAMUSCULAR | Status: AC
  Filled 2019-06-07: qty 1

## 2019-06-07 MED ORDER — PROMETHAZINE HCL 25 MG/ML IJ SOLN
6.2500 mg | INTRAMUSCULAR | Status: DC | PRN
  Administered 2019-06-07: 12.5 mg via INTRAVENOUS

## 2019-06-07 MED ORDER — LIDOCAINE HCL (PF) 2 % IJ SOLN
INTRAMUSCULAR | Status: AC
  Filled 2019-06-07: qty 10

## 2019-06-07 MED ORDER — SUGAMMADEX SODIUM 200 MG/2ML IV SOLN
INTRAVENOUS | Status: DC | PRN
  Administered 2019-06-07: 200 mg via INTRAVENOUS

## 2019-06-07 MED ORDER — PROPOFOL 10 MG/ML IV BOLUS
INTRAVENOUS | Status: DC | PRN
  Administered 2019-06-07: 170 mg via INTRAVENOUS

## 2019-06-07 MED ORDER — ROCURONIUM BROMIDE 100 MG/10ML IV SOLN
INTRAVENOUS | Status: DC | PRN
  Administered 2019-06-07: 10 mg via INTRAVENOUS
  Administered 2019-06-07: 40 mg via INTRAVENOUS

## 2019-06-07 MED ORDER — ONDANSETRON HCL 4 MG/2ML IJ SOLN
INTRAMUSCULAR | Status: AC
  Filled 2019-06-07: qty 2

## 2019-06-07 MED ORDER — ONDANSETRON HCL 4 MG/2ML IJ SOLN
INTRAMUSCULAR | Status: DC | PRN
  Administered 2019-06-07: 4 mg via INTRAVENOUS

## 2019-06-07 MED ORDER — MIDAZOLAM HCL 2 MG/2ML IJ SOLN
INTRAMUSCULAR | Status: DC | PRN
  Administered 2019-06-07: 2 mg via INTRAVENOUS

## 2019-06-07 MED ORDER — DEXAMETHASONE SODIUM PHOSPHATE 10 MG/ML IJ SOLN
INTRAMUSCULAR | Status: DC | PRN
  Administered 2019-06-07: 10 mg via INTRAVENOUS

## 2019-06-07 MED ORDER — FENTANYL CITRATE (PF) 100 MCG/2ML IJ SOLN
INTRAMUSCULAR | Status: AC
  Filled 2019-06-07: qty 2

## 2019-06-07 MED ORDER — SCOPOLAMINE 1 MG/3DAYS TD PT72
MEDICATED_PATCH | TRANSDERMAL | Status: AC
  Administered 2019-06-07: 10:00:00 via TOPICAL
  Filled 2019-06-07: qty 1

## 2019-06-07 MED ORDER — LIDOCAINE HCL (CARDIAC) PF 100 MG/5ML IV SOSY
PREFILLED_SYRINGE | INTRAVENOUS | Status: DC | PRN
  Administered 2019-06-07: 100 mg via INTRAVENOUS

## 2019-06-07 MED ORDER — ROCURONIUM BROMIDE 50 MG/5ML IV SOLN
INTRAVENOUS | Status: AC
  Filled 2019-06-07: qty 1

## 2019-06-07 SURGICAL SUPPLY — 51 items
APPLICATOR ARISTA FLEXITIP XL (MISCELLANEOUS) ×4 IMPLANT
BAG URINE DRAINAGE (UROLOGICAL SUPPLIES) ×4 IMPLANT
BLADE SURG SZ11 CARB STEEL (BLADE) ×4 IMPLANT
CATH FOLEY 2WAY  5CC 16FR (CATHETERS) ×2
CATH URTH 16FR FL 2W BLN LF (CATHETERS) ×2 IMPLANT
CHLORAPREP W/TINT 26 (MISCELLANEOUS) ×4 IMPLANT
CLOSURE WOUND 1/4X4 (GAUZE/BANDAGES/DRESSINGS) ×1
COVER WAND RF STERILE (DRAPES) IMPLANT
DERMABOND ADVANCED (GAUZE/BANDAGES/DRESSINGS) ×2
DERMABOND ADVANCED .7 DNX12 (GAUZE/BANDAGES/DRESSINGS) ×2 IMPLANT
DRAPE GENERAL ENDO 106X123.5 (DRAPES) ×4 IMPLANT
DRAPE LEGGINS SURG 28X43 STRL (DRAPES) ×4 IMPLANT
DRAPE STERI POUCH LG 24X46 STR (DRAPES) IMPLANT
DRAPE UNDER BUTTOCK W/FLU (DRAPES) ×4 IMPLANT
GLOVE BIO SURGEON STRL SZ7 (GLOVE) ×8 IMPLANT
GLOVE INDICATOR 7.5 STRL GRN (GLOVE) ×4 IMPLANT
GOWN STRL REUS W/ TWL LRG LVL3 (GOWN DISPOSABLE) ×4 IMPLANT
GOWN STRL REUS W/TWL LRG LVL3 (GOWN DISPOSABLE) ×4
GRASPER SUT TROCAR 14GX15 (MISCELLANEOUS) ×4 IMPLANT
HEMOSTAT ARISTA ABSORB 3G PWDR (HEMOSTASIS) ×4 IMPLANT
IRRIGATION STRYKERFLOW (MISCELLANEOUS) ×2 IMPLANT
IRRIGATOR STRYKERFLOW (MISCELLANEOUS) ×4
IV NS 1000ML (IV SOLUTION) ×2
IV NS 1000ML BAXH (IV SOLUTION) ×2 IMPLANT
KIT PINK PAD W/HEAD ARE REST (MISCELLANEOUS) ×4
KIT PINK PAD W/HEAD ARM REST (MISCELLANEOUS) ×2 IMPLANT
KIT TURNOVER CYSTO (KITS) ×4 IMPLANT
LABEL OR SOLS (LABEL) ×4 IMPLANT
LIGASURE LAP MARYLAND 5MM 37CM (ELECTROSURGICAL) ×4 IMPLANT
LIGASURE VESSEL 5MM BLUNT TIP (ELECTROSURGICAL) IMPLANT
NEEDLE FILTER BLUNT 18X 1/2SAF (NEEDLE) ×2
NEEDLE FILTER BLUNT 18X1 1/2 (NEEDLE) ×2 IMPLANT
NS IRRIG 500ML POUR BTL (IV SOLUTION) ×4 IMPLANT
PACK GYN LAPAROSCOPIC (MISCELLANEOUS) ×4 IMPLANT
PAD OB MATERNITY 4.3X12.25 (PERSONAL CARE ITEMS) ×4 IMPLANT
PAD PREP 24X41 OB/GYN DISP (PERSONAL CARE ITEMS) ×4 IMPLANT
POUCH SPECIMEN RETRIEVAL 10MM (ENDOMECHANICALS) IMPLANT
SCISSORS METZENBAUM CVD 33 (INSTRUMENTS) IMPLANT
SET TUBE SMOKE EVAC HIGH FLOW (TUBING) ×4 IMPLANT
SLEEVE ENDOPATH XCEL 5M (ENDOMECHANICALS) ×4 IMPLANT
STRIP CLOSURE SKIN 1/4X4 (GAUZE/BANDAGES/DRESSINGS) ×3 IMPLANT
SUT MNCRL AB 4-0 PS2 18 (SUTURE) ×4 IMPLANT
SUT VIC AB 2-0 UR6 27 (SUTURE) ×4 IMPLANT
SUT VIC AB 4-0 SH 27 (SUTURE) ×2
SUT VIC AB 4-0 SH 27XANBCTRL (SUTURE) ×2 IMPLANT
SUT VICRYL 0 AB UR-6 (SUTURE) ×4 IMPLANT
SYR 50ML LL SCALE MARK (SYRINGE) IMPLANT
SYR 5ML LL (SYRINGE) ×4 IMPLANT
TROCAR ENDO BLADELESS 11MM (ENDOMECHANICALS) ×4 IMPLANT
TROCAR XCEL NON-BLD 5MMX100MML (ENDOMECHANICALS) ×4 IMPLANT
TUBING ART PRESS 48 MALE/FEM (TUBING) IMPLANT

## 2019-06-07 NOTE — Progress Notes (Signed)
Patient transported to OR by orderly at 0920 on 06/07/19 via bed. Reed Breech, RN 06/07/2019 10:17 AM

## 2019-06-07 NOTE — Progress Notes (Signed)
Patient quickly responding to NS bolus with improvement in VSs and stating that dizziness and ringing in ears has resolved.  Orders for BMP and CBC received.  Lab called to notify of stat orders.

## 2019-06-07 NOTE — Transfer of Care (Signed)
Anesthesia Post Note  Patient: Beth Wells  Procedure(s) Performed: Procedure(s) (LRB): LAPAROSCOPY DIAGNOSTIC,PERITANEAL BIOPSIES (N/A) LAPAROSCOPIC OVARIAN CYSTECTOMY (Right)  Anesthesia type: MAC  Patient location: Phase II  Post pain: Pain level controlled  Post assessment: Post-op Vital signs reviewed  Last Vitals:  Vitals:   06/07/19 0914 06/07/19 1110  BP: 115/79 109/70  Pulse: 81 79  Resp: 18 16  Temp: 36.9 C 36.4 C  SpO2: 100% 100%    Post vital signs: stable  Level of consciousness: Patient remains intubated per anesthesia plan  Complications: No apparent anesthesia complications

## 2019-06-07 NOTE — Anesthesia Procedure Notes (Signed)
Procedure Name: Intubation Date/Time: 06/07/2019 9:41 AM Performed by: Doreen Salvage, CRNA Pre-anesthesia Checklist: Patient identified, Patient being monitored, Timeout performed, Emergency Drugs available and Suction available Patient Re-evaluated:Patient Re-evaluated prior to induction Oxygen Delivery Method: Circle system utilized Preoxygenation: Pre-oxygenation with 100% oxygen Induction Type: IV induction Ventilation: Mask ventilation without difficulty Laryngoscope Size: Mac, 3 and McGraph Grade View: Grade I Tube type: Oral Tube size: 7.0 mm Number of attempts: 1 Airway Equipment and Method: Stylet Placement Confirmation: ETT inserted through vocal cords under direct vision,  positive ETCO2 and breath sounds checked- equal and bilateral Secured at: 20 cm Tube secured with: Tape Dental Injury: Teeth and Oropharynx as per pre-operative assessment

## 2019-06-07 NOTE — Progress Notes (Signed)
Pt up to BR, complained of dizziness, ears ringing, drop in BP 70/40s. Pt quickly returned to bed, RN notified. NS bolus started, MD paged. Orders placed per Dr. Leafy Ro. Rapid response called due to pt status. ICU nurse and RT to bedside.

## 2019-06-07 NOTE — Significant Event (Signed)
Rapid Response Event Note  Overview: Time Called: 1435 Arrival Time: 1438 Event Type: Hypotension  Initial Focused Assessment: called for RR for post surgical patient who was dizzy and hypotensive upon first trip up to bathroom after surgery.   Interventions: Bolus and labs  Plan of Care (if not transferred):RN will call if further assistance needed  Event Summary: Name of Physician Notified: Beasley at 1435    at    Outcome: Stayed in room and stabalized     Beth Wells A

## 2019-06-07 NOTE — Anesthesia Post-op Follow-up Note (Signed)
Anesthesia QCDR form completed.        

## 2019-06-07 NOTE — Discharge Instructions (Signed)
Discharge instructions after   laparoscopic pelvic surgery   For the next three days, take ibuprofen and acetaminophen on a schedule, every 8 hours. You can take them together or you can intersperse them, and take one every four hours. I also gave you gabapentin for nighttime, to help you sleep and also to control pain. Take gabapentin medicines at night for at least the next 3 nights. You also have a narcotic, oxycodone, to take as needed if the above medicines don't help.  Postop constipation is a major cause of pain. Stay well hydrated, walk as you tolerate, and take over the counter senna as well as stool softeners if you need them.  You also have a scopolamine patch on, which you can remove in 24 hours. This helps with postoperative nausea.    Signs and Symptoms to Report Call our office at [phone] if you have any of the following.   Fever over 100.4 degrees or higher  Severe stomach pain not relieved with pain medications  Bright red bleeding thats heavier than a period that does not slow with rest  To go the bathroom a lot (frequency), you cant hold your urine (urgency), or it hurts when you empty your bladder (urinate)  Chest pain  Shortness of breath  Pain in the calves of your legs  Severe nausea and vomiting not relieved with anti-nausea medications  Signs of infection around your wounds, such as redness, hot to touch, swelling, green/yellow drainage (like pus), bad smelling discharge  Any concerns  What You Can Expect after Surgery  You may see some pink tinged, bloody fluid and bruising around the wound. This is normal.  You may notice shoulder and neck pain. This is caused by the gas used during surgery to expand your abdomen so your surgeon could get to the uterus easier.  You may have a sore throat because of the tube in your mouth during general anesthesia. This will go away in 2 to 3 days.  You may have some stomach cramps.  You may notice  spotting on your panties.  You may have pain around the incision sites.   Activities after Your Discharge Follow these guidelines to help speed your recovery at home:  Do the coughing and deep breathing as you did in the hospital for 2 weeks. Use the small blue breathing device, called the incentive spirometer for 2 weeks.  Dont drive if you are in pain or taking narcotic pain medicine. You may drive when you can safely slam on the brakes, turn the wheel forcefully, and rotate your torso comfortably. This is typically 1-2 weeks. Practice in a parking lot or side street prior to attempting to drive regularly.   Ask others to help with household chores for 4 weeks.  Do not lift anything heavier that 10 pounds for 4-6 weeks. This includes pets, children, and groceries.  Dont do strenuous activities, exercises, or sports like vacuuming, tennis, squash, etc. until your doctor says it is safe to do so. ---Maintain pelvic rest for 8 weeks. This means nothing in the vagina or rectum at all (no douching, tampons, intercourse) for 8 weeks.   Walk as you feel able. Rest often since it may take two or three weeks for your energy level to return to normal.   You may climb stairs  Avoid constipation:   -Eat fruits, vegetables, and whole grains. Eat small meals as your appetite will take time to return to normal.   -Drink 6 to 8  glasses of water each day unless your doctor has told you to limit your fluids.   -Use a laxative or stool softener as needed if constipation becomes a problem. You may take Miralax, metamucil, Citrucil, Colace, Senekot, FiberCon, etc. If this does not relieve the constipation, try two tablespoons of Milk Of Magnesia every 8 hours until your bowels move.   You may shower. Gently wash the wounds with a mild soap and water. Pat dry.  Do not get in a hot tub, swimming pool, etc. for 6 weeks.  Do not use lotions, oils, powders on the wounds.  Do not douche, use tampons, or  have sex until your doctor says it is okay.  Take your pain medicine when you need it. The medicine may not work as well if the pain is bad.  Take the medicines you were taking before surgery. Other medications you will need are pain medications and possibly constipation and nausea medications (Zofran).   Here is a helpful article from the website [employer], regarding constipation  Here are reasons why constipation occurs after surgery: 1) During the operation and in the recovery room, most people are given opioid pain medication, primarily through an IV, to treat moderate or severe pain. Intravenous opioids include morphine, Dilaudid and fentanyl. After surgery, patients are often prescribed opioid pain medication to take by mouth at home, including codeine, Vicodin, Norco, and Percocet. All of these medications cause constipation by slowing down the movement of your intestine. 2) Changes in your diet before surgery can be another culprit. It is common to get specific instructions to change how you normally eat or drink before your surgery, like only having liquids the day before or not having anything to eat or drink after midnight the night before surgery. For this reason, temporary dehydration may occur. This, along with not eating or only having liquids, means that you are getting less fiber than usual. Both these factors contribute to constipation. 3) Changes in your diet after surgery can also contribute to the problem. Although many people dont have dietary restrictions after operations, being under anesthesia can make you lose your appetite for several hours and maybe even days. Some people can even have nausea or vomiting. Not eating or drinking normally means that you are not getting enough fiber and you can get dehydrated, both leading to constipation. 4) Lying in a bed more than usual--which happens before, during and after surgery--combined with the medications and diet changes, all  work together to slow down your colon and make your poop turn to rock.  No one likes to be constipated.  Lets face it, its not a pleasant feeling when you dont poop for days, then strain on the toilet to finally pass something large enough to cause damage. An ounce of prevention is worth a pound of cure, so: 1. Assume you will be constipated. 2. Plan and prepare accordingly. Post-surgery is one of those unique situations where the temporary use of laxatives can make a world of difference. Always consult with your doctor, and recognize that if you wait several days after surgery to take a laxative, the constipation might be too severe for these over-the-counter options. It is always important to discuss all medications you plan on taking with your doctor. Ask your doctor if you can start the laxative immediately after surgery. *  Here are go-to post-surgery laxatives: Senna: Senna is an herb that acts as a stimulant laxative, meaning it increases the activity of the intestine to cause  you to have a bowel movement. It comes in many forms, but senna pills are easy to take and are sold over the counter at almost all pharmacies. Since opioid pain medications slow down the activity of the intestine, it makes sense to take a medication to help reverse that side effect. Long-term use of a stimulant laxative is not a good idea since it can make your colon lazy and not function properly; however, temporary use immediately after surgery is acceptable. In general, if you are able to eat a normal diet, taking senna soon after surgery works the best. Senna usually works within hours to produce a bowel movement, but this is less predictable when you are taking different medications after surgery. Try not to wait several days to start taking senna, as often it is too late by then. Just like with all medications or supplements, check with your doctor before starting new treatment.   Magnesium: Magnesium is an  important mineral that our body needs. We get magnesium from some foods that we eat, especially foods that are high in fiber such as broccoli, almonds and whole grains. There are also magnesium-based medications used to treat constipation including milk of magnesia (magnesium hydroxide), magnesium citrate and magnesium oxide. They work by drawing water into the intestine, putting it into the class of osmotic laxatives. Magnesium products in low doses appear to be safe, but if taken in very large doses, can lead to problems such as irregular heartbeat, low blood pressure and even death. It can also affect other medications you might be taking, therefore it is important to discuss using magnesium with your physician and pharmacist before initiating therapy. Most over-the-counter magnesium laxatives work very well to help with the constipation related to surgery, but sometimes they work too well and lead to diarrhea. Make sure you are somewhere with easy access to a bathroom, just in case.   Bisacodyl: Bisacodyl (generic name) is sold under brand names such as Dulcolax. Much like senna, it is a stimulant laxative, meaning it makes your intestines move more quickly to push out the stool. This is another good choice to start taking as soon as your doctor says you can take a laxative after surgery. It comes in pill form and as a suppository, which is a good choice for people who cannot or are not allowed to swallow pills. Studies have shown that it works as a laxative, but like most of these medications, you should use this on a short-term basis only.   Enema: Enemas strike fear in many people, but FEAR NOT! Its nowhere near as big a deal as you may think. An enema is just a way to get some liquid into your rectum by placing a specially designed device through your anus. If you have never done one, it might seem like a painful, unpleasant, uncomfortable, complicated and lengthy procedure. But in reality, its  simple, takes just a few seconds and is highly effective. The small ready-made bottles you buy at the pharmacy are much easier than the hose/large rubber container type. Those recommended positions illustrated in some instructions are generally not necessary to place the enema. Its very similar to the insertion of a tampon, requiring a slight squat. Some extra lubrication on the enemas tip (or on your anus) will make it a breeze. In certain cases, there is no substitute for a good enema. For example, if someone has not pooped for a few days, the beginning of the poop waiting to come out  can become rock hard. Passing that hard stool can lead to much pain and problems like anal fissures. Inserting a little liquid to break up the rock-hard stool will help make its passage much easier. Enemas come with different liquids. Most come with saline, but there are also mineral oil options. You can also use warm water in the reusable enema containers. They all work. But since saline can sometimes be irritating, so try a mineral oil or water enema instead.  Here are commonly recommended constipation medications that do not work well for post-surgery constipation: Docusate: Docusate (generic name) most commonly referred to as Colace (brand name) is not really a laxative, but is classified as a stool softener. Although this medication is commonly prescribed, it is not recommended for several reasons: 1) there is no good medical evidence that it works 2) even if it has an effect, which is very questionable, it is minimal and cannot combat the intestinal slowing caused by the opioid medications. Skip docusate to save money and space in your pillbox for something more effective.  PEG: Miralax (brand name) is basically a chemical called polyethylene glycol (PEG) and it has gained tremendous popularity as a laxative. This product is an osmotic laxative meaning it works by pulling water into the stool, making it softer. This  is very similar to the action of natural fiber in foods and supplements. Therefore, the effect seen by this medication is not immediate, causing a bowel movement in a day or more. Is this medication strong enough to battle the constipation related to having an operation? Maybe for some people not prone to constipation. But for most people, other laxatives are better to prevent constipation after surgery.

## 2019-06-07 NOTE — Progress Notes (Signed)
Patient returned from OR via bed by PACU RN at 1208 on 06/07/19. Reed Breech, RN 06/07/2019

## 2019-06-07 NOTE — Progress Notes (Signed)
15 minute call to floor. 

## 2019-06-07 NOTE — Op Note (Signed)
Beth Wells PROCEDURE DATE: 06/07/2019  PREOPERATIVE DIAGNOSIS: Acute exacerbation of chronicpelvic pain POSTOPERATIVE DIAGNOSIS: Endometriosis, stage 3 PROCEDURE:  1. Exam under anesthesia 2. Operative laparoscopy 3. Fulguration of endometriosis 4. Peritoneal biopsies 5. Evacuation of pelvic fluid 6. Right ovarian cystectomy  SURGEON:  Dr. [name] ASSISTANT: Dr. Marcello Moores Schermerhorn  ANESTHESIOLOGIST: No responsible provider has been recorded for the case. Anesthesiologist: Alphonsus Sias, MD CRNA: Doreen Salvage, CRNA  INDICATIONS: 43 y.o. F with history of chronic mild pelvic pain with an acute exacerbation, desiring surgical evaluation.   Please see preoperative notes for further details.  Risks of surgery were discussed with the patient including but not limited to: bleeding which may require transfusion or reoperation; infection which may require antibiotics; injury to bowel, bladder, ureters or other surrounding organs; need for additional procedures including laparotomy; thromboembolic phenomenon, incisional problems and other postoperative/anesthesia complications. Written informed consent was obtained.    FINDINGS:    Significant endometriosis fluid noted in the pelvis on entry.  Normal uterus, with normal tubes bilaterally.  Bilateral ovaries with endometriosis implants on their surfaces, and right ovary with a large endometrioma.  Moderate adhesions at the uterosacral ligaments, with endometriosis noted in the posterior cul-de-sac, bilateral uterosacral ligaments, and bilateral ovarian fossae, and along the bladder peritoneum.  Normal appendix.  Normal upper abdomen, with hemosiderin staining in the right upper quadrant peritoneum cephalad to the liver. No hepatic adhesions.  ANESTHESIA:    General INTRAVENOUS FLUIDS: 800 ml ESTIMATED BLOOD LOSS: 25 ml URINE OUTPUT: 300 ml SPECIMENS: Peritoneal biopsy, right ovarian cyst wall.  COMPLICATIONS: None  immediate  PROCEDURE IN DETAIL:  The patient had sequential compression devices applied to her lower extremities while in the preoperative area.  She was then taken to the operating room where general anesthesia was administered and was found to be adequate.  She was placed in the dorsal lithotomy position, and was prepped and draped in a sterile manner.  A Foley catheter was inserted into her bladder and attached to constant drainage and an acorn uterine manipulator was then advanced into the uterus .  After an adequate timeout was performed, attention was turned to the abdomen where an umbilical incision was made with the scalpel.  The Optiview 5-mm trocar and sleeve were then advanced without difficulty with the laparoscope under direct visualization into the abdomen.  The abdomen was then insufflated with carbon dioxide gas and adequate pneumoperitoneum was obtained.   A detailed survey of the patient's pelvis and abdomen revealed the findings as mentioned above.  An additional 32mm trocar was placed in the right lower quadrants under direct visualization, and at 73mm trocar was placed in the opposite quadrant under direct visualization.   Right ovarian cystectomy The ovarian cyst was evaluated and dissected out from the normal ovarian tissue.  Significant chocolate colored fluid was irrigated out.  Hemostasis was assured with electrocautery and Arista.   Endometriosis implants on the left ovary and the posterior cul-de-sac and bilateral ovarian fossae were fulgurated.  The pelvis was irrigated and all fluid and blood removed. The patient was placed in reverse Trendelenburg and all fluid removed.  The operative site was surveyed, and it was found to be hemostatic.  No intraoperative injury to surrounding organs was noted. The fascia of the 10 mm trocar site was closed with 0 Vicryl.   Pictures were taken of the quadrants and pelvis. The abdomen was desufflated and all instruments were then removed  from the patient's abdomen. The uterine manipulator was  removed without complications.  All incisions were closed with 4-0 Vicryl and Dermabond.   The patient tolerated the procedures well.  All instruments, needles, and sponge counts were correct x 2. The patient was taken to the recovery room in stable condition.

## 2019-06-08 ENCOUNTER — Encounter: Payer: Self-pay | Admitting: Obstetrics and Gynecology

## 2019-06-08 NOTE — Progress Notes (Signed)
Pt ambulated around nursing station twice with no complications, VS WNL. MD notified of pt status and ok to discharge

## 2019-06-08 NOTE — Progress Notes (Signed)
Discharge inst given to pt and husband, no needs or concerns expressed. IV in RH removed. Prescription slip for oxy given to patient per MD. Pt discharged home via [facility] per [facility]

## 2019-06-08 NOTE — Anesthesia Postprocedure Evaluation (Signed)
Anesthesia Post Note  Patient: Beth Wells  Procedure(s) Performed: LAPAROSCOPY DIAGNOSTIC,PERITANEAL BIOPSIES (N/A Abdomen) LAPAROSCOPIC OVARIAN CYSTECTOMY (Right Abdomen)  Patient location during evaluation: PACU Anesthesia Type: General Level of consciousness: awake and alert Pain management: pain level controlled Vital Signs Assessment: post-procedure vital signs reviewed and stable Respiratory status: spontaneous breathing, nonlabored ventilation and respiratory function stable Cardiovascular status: blood pressure returned to baseline and stable Postop Assessment: no apparent nausea or vomiting Anesthetic complications: no     Last Vitals:  Vitals:   06/07/19 1816 06/07/19 2021  BP:  129/84  Pulse:  91  Resp:    Temp: 36.9 C 36.9 C  SpO2:  99%    Last Pain:  Vitals:   06/07/19 2010  TempSrc:   PainSc: 4                  Syanne Looney Harvie Heck

## 2019-06-12 LAB — SURGICAL PATHOLOGY

## 2019-06-18 NOTE — Discharge Summary (Signed)
Pt discharged home after surgery

## 2019-07-24 ENCOUNTER — Ambulatory Visit
Payer: BC Managed Care – PPO | Attending: Student in an Organized Health Care Education/Training Program | Admitting: Student in an Organized Health Care Education/Training Program

## 2019-07-24 ENCOUNTER — Other Ambulatory Visit: Payer: Self-pay

## 2019-07-24 ENCOUNTER — Encounter: Payer: Self-pay | Admitting: Student in an Organized Health Care Education/Training Program

## 2019-07-24 VITALS — BP 125/87 | HR 90 | Temp 97.7°F | Resp 16 | Ht 62.0 in | Wt 103.8 lb

## 2019-07-24 DIAGNOSIS — R202 Paresthesia of skin: Secondary | ICD-10-CM | POA: Diagnosis present

## 2019-07-24 DIAGNOSIS — G894 Chronic pain syndrome: Secondary | ICD-10-CM

## 2019-07-24 DIAGNOSIS — G603 Idiopathic progressive neuropathy: Secondary | ICD-10-CM

## 2019-07-24 DIAGNOSIS — G629 Polyneuropathy, unspecified: Secondary | ICD-10-CM

## 2019-07-24 MED ORDER — ALPHA-LIPOIC ACID 600 MG PO CAPS: 600.0000 mg | ORAL_CAPSULE | Freq: Every day | ORAL | 1 refills | Status: AC

## 2019-07-24 MED ORDER — PREGABALIN 50 MG PO CAPS: ORAL_CAPSULE | ORAL | 0 refills | Status: DC

## 2019-07-24 NOTE — Progress Notes (Signed)
Patient's Name: Beth Wells  MRN: [id number]  Referring Provider: Vertell Limber  DOB: Mar 17, 1976  PCP: [name]  DOS: 07/24/2019  Note by: Gillis Santa, MD  Service setting: Ambulatory outpatient  Specialty: Interventional Pain Management  Location: [facility] (AMB) Pain Management Facility  Visit type: Initial Patient Evaluation  Patient type: New Patient   Primary Reason(s) for Visit: Encounter for initial evaluation of one or more chronic problems (new to examiner) potentially causing chronic pain, and posing a threat to normal musculoskeletal function. (Level of risk: High) CC: Peripheral Neuropathy  HPI  Beth Wells is a 43 y.o. year old, female patient, who comes today to see [place] for the first time for an initial evaluation of her chronic pain. 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 Today she comes in for evaluation of her Peripheral Neuropathy  Pain Assessment: Location:   Generalized Radiating:  n/a Onset: More than a month ago Duration: Neuropathic pain Quality: Burning Severity: 4 /10 (subjective, self-reported pain score)  Note: Reported level is compatible with observation.                         When using our objective Pain Scale, levels between 6 and 10/10 are said to belong in an emergency room, as it progressively worsens from a 6/10, described as severely limiting, requiring emergency care not usually available at an outpatient pain management facility. At a 6/10 level, communication becomes difficult and requires great effort. Assistance to reach the emergency department may be required. Facial flushing and profuse sweating along with potentially dangerous increases in heart rate and blood pressure will be evident. Effect on ADL: distracting at work Timing: Intermittent(constant  at night) Modifying factors: walking/moving BP: 125/87  HR: 90  Onset and Duration: Gradual and Date of onset: 06/2017 Cause of pain: Unknown Severity: Getting better, Getting worse, NAS-11 at its worse: 10/10, NAS-11 at its best: 2/10, NAS-11 now: 2/10 and NAS-11 on the average: 5/10 Timing: Night Aggravating Factors: Prolonged sitting and cold temp Alleviating Factors: Hot packs, TENS, Warm showers or baths and Walking Associated Problems: Temperature changes and Tingling Quality of Pain: Annoying, Burning, Intermittent, Cruel, Distressing, Dreadful, Horrible, Hot, Tingling and Uncomfortable Previous Examinations or Tests: Biopsy, Nerve conduction test and Neurological evaluation Previous Treatments: TENS  The patient comes into the clinics today for the first time for a chronic pain management evaluation.   Patient is a pleasant 43 year old female who presents with burning paresthesias in her upper and lower extremities.  She has a diagnosis of small fiber neuropathy that was made by punch biopsy.  She has had her paresthesias intermittently, initially started in October 2018 subsided then returned in December 2018 subsided and then have returned again.  Neuropathy panel which included B12, ferritin were within normal limits.  Patient had a lower extremity EMG study performed 03/20/2018 which was unremarkable and also had an upper extremity EMG study performed 03/06/2018 which was unremarkable.  She is currently on gabapentin 600 mg in the evening which she does not find very effective in managing her pain.  Recently her Cymbalta was increased from 20 mg to 40 mg daily.  We will focus on nonopioid-based pharmacologic therapies  Meds   Current Outpatient Medications:  .  amphetamine-dextroamphetamine (ADDERALL XR) 20 MG 24 hr capsule, Take 20 mg by mouth daily.,  Disp: , Rfl:  .  amphetamine-dextroamphetamine (ADDERALL) 5 MG tablet, Take 5 mg by mouth daily as needed (busy work days.). ,  Disp: , Rfl:  .  Ascorbic Acid (VITAMIN C) 1000 MG tablet, Take 1,000 mg by mouth daily. Chewable, Disp: , Rfl:  .  Cholecalciferol (VITAMIN D3 PO), Take 1 tablet by mouth daily., Disp: , Rfl:  .  DULoxetine (CYMBALTA) 20 MG capsule, Take 40 mg by mouth at bedtime., Disp: , Rfl:  .  ibuprofen (ADVIL) 200 MG tablet, Take 400 mg by mouth every 8 (eight) hours as needed (headache pain.)., Disp: , Rfl:  .  lisinopril (ZESTRIL) 2.5 MG tablet, Take 5 mg by mouth every evening. , Disp: , Rfl:  .  Probiotic Product (PROBIOTIC DAILY PO), Take by mouth., Disp: , Rfl:  .  Alpha-Lipoic Acid 600 MG CAPS, Take 1 capsule (600 mg total) by mouth daily., Disp: 30 capsule, Rfl: 1 .  pregabalin (LYRICA) 50 MG capsule, Take 1 capsule (50 mg total) by mouth at bedtime for 30 days, THEN 2 capsules (100 mg total) at bedtime., Disp: 90 capsule, Rfl: 0  Imaging Review    ROS  Cardiovascular: High blood pressure Pulmonary or Respiratory: No reported pulmonary signs or symptoms such as wheezing and difficulty taking a deep full breath (Asthma), difficulty blowing air out (Emphysema), coughing up mucus (Bronchitis), persistent dry cough, or temporary stoppage of breathing during sleep Neurological: Abnormal skin sensations (Peripheral Neuropathy) Review of Past Neurological Studies: No results found for this or any previous visit. Psychological-Psychiatric: No reported psychological or psychiatric signs or symptoms such as difficulty sleeping, anxiety, depression, delusions or hallucinations (schizophrenial), mood swings (bipolar disorders) or suicidal ideations or attempts Gastrointestinal: No reported gastrointestinal signs or symptoms such as vomiting or evacuating blood, reflux, heartburn, alternating episodes of diarrhea and constipation, inflamed or scarred liver, or pancreas or irrregular and/or infrequent bowel movements Genitourinary: Passing kidney stones Hematological: No reported hematological signs or symptoms  such as prolonged bleeding, low or poor functioning platelets, bruising or bleeding easily, hereditary bleeding problems, low energy levels due to low hemoglobin or being anemic Endocrine: No reported endocrine signs or symptoms such as high or low blood sugar, rapid heart rate due to high thyroid levels, obesity or weight gain due to slow thyroid or thyroid disease Rheumatologic: No reported rheumatological signs and symptoms such as fatigue, joint pain, tenderness, swelling, redness, heat, stiffness, decreased range of motion, with or without associated rash Musculoskeletal: Negative for myasthenia gravis, muscular dystrophy, multiple sclerosis or malignant hyperthermia Work History: Working full time  Allergies  Ms. Frentz has No Known Allergies.  Laboratory Chemistry Profile   Screening Lab Results  Component Value Date   SARSCOV2NAA NEGATIVE 06/05/2019   PREGTESTUR NEGATIVE 05/30/2019    Inflammation (CRP: Acute Phase) (ESR: Chronic Phase) No results found for: CRP, ESRSEDRATE, LATICACIDVEN                       Rheumatology No results found for: RF, ANA, LABURIC, URICUR, LYMEIGGIGMAB, LYMEABIGMQN, HLAB27                      Renal Lab Results  Component Value Date   BUN 16 06/07/2019   CREATININE 0.84 06/07/2019   GFRAA >60 06/07/2019   GFRNONAA >60 06/07/2019                             Hepatic Lab  Results  Component Value Date   AST 21 05/30/2019   ALT 15 05/30/2019   ALBUMIN 4.5 05/30/2019   ALKPHOS 44 05/30/2019                        Electrolytes Lab Results  Component Value Date   NA 137 06/07/2019   K 4.2 06/07/2019   CL 106 06/07/2019   CALCIUM 8.3 (L) 06/07/2019                        Neuropathy No results found for: VITAMINB12, FOLATE, HGBA1C, HIV                      CNS No results found for: COLORCSF, APPEARCSF, RBCCOUNTCSF, WBCCSF, POLYSCSF, LYMPHSCSF, EOSCSF, PROTEINCSF, GLUCCSF, JCVIRUS, CSFOLI, IGGCSF, LABACHR, ACETBL                       Bone No results found for: Ashley, [id number], [id number], [id number], 25OHVITD1, 25OHVITD2, 25OHVITD3, TESTOFREE, TESTOSTERONE                       Coagulation Lab Results  Component Value Date   PLT 214 06/07/2019                        Cardiovascular Lab Results  Component Value Date   HGB 12.8 06/07/2019   HCT 39.0 06/07/2019                         ID Lab Results  Component Value Date   SARSCOV2NAA NEGATIVE 06/05/2019   PREGTESTUR NEGATIVE 05/30/2019    Cancer No results found for: CEA, CA125, LABCA2                      Endocrine No results found for: TSH, FREET4, TESTOFREE, TESTOSTERONE, SHBG, ESTRADIOL, ESTRADIOLPCT, ESTRADIOLFRE, LABPREG, ACTH                      Note: Lab results reviewed.  PFSH  Drug: Ms. Campus  reports previous drug use. Alcohol:  reports previous alcohol use. Tobacco:  reports that she has never smoked. She has never used smokeless tobacco. Medical:  has a past medical history of H/O foot surgery (08/2018) and Hypertension. Family: family history is not on file.  Past Surgical History:  Procedure Laterality Date  . CERVICAL CONE BIOPSY    . DILATION AND EVACUATION N/A 03/12/2018   Procedure: DILATATION AND EVACUATION;  Surgeon: [name], MD;  Location: [facility] ORS;  Service: Gynecology;  Laterality: N/A;  . KIDNEY STONE SURGERY    . LAPAROSCOPIC OVARIAN CYSTECTOMY Right 06/07/2019   Procedure: LAPAROSCOPIC OVARIAN CYSTECTOMY;  Surgeon: [name], MD;  Location: [facility] ORS;  Service: Gynecology;  Laterality: Right;  . LAPAROSCOPY N/A 06/07/2019   Procedure: LAPAROSCOPY Luvenia Redden BIOPSIES;  Surgeon: [name], MD;  Location: [facility] ORS;  Service: Gynecology;  Laterality: N/A;   Active Ambulatory Problems    Diagnosis Date Noted  . Chromosomal abnormality   . 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 Bilateral foot pain 12/11/2018  . Small fiber  neuropathy 07/24/2019  . Paresthesias 07/24/2019  . Chronic pain syndrome 07/24/2019   Resolved Ambulatory Problems    Diagnosis Date Noted  . No Resolved Ambulatory Problems  Past Medical History:  Diagnosis Date  . H/O foot surgery 08/2018  . Hypertension    Constitutional Exam  General appearance: Well nourished, well developed, and well hydrated. In no apparent acute distress Vitals:   07/24/19 1108  BP: 125/87  Pulse: 90  Resp: 16  Temp: 97.7 F (36.5 C)  TempSrc: Temporal  SpO2: 99%  Weight: 103 lb 12.8 oz (47.1 kg)  Height: _0  (1.575 m)   BMI Assessment: Estimated body mass index is 18.99 kg/m as calculated from the following:   Height as of this encounter: _1  (1.575 m).   Weight as of this encounter: 103 lb 12.8 oz (47.1 kg).  BMI interpretation table: BMI level Category Range association with higher incidence of chronic pain  <18 kg/m2 Underweight   18.5-24.9 kg/m2 Ideal body weight   25-29.9 kg/m2 Overweight Increased incidence by 20%  30-34.9 kg/m2 Obese (Class I) Increased incidence by 68%  35-39.9 kg/m2 Severe obesity (Class II) Increased incidence by 136%  >40 kg/m2 Extreme obesity (Class III) Increased incidence by 254%   Patient's current BMI Ideal Body weight  Body mass index is 18.99 kg/m. Ideal body weight: 50.1 kg (110 lb 7.2 oz)   BMI Readings from Last 4 Encounters:  07/24/19 18.99 kg/m  06/06/19 18.66 kg/m  05/30/19 18.15 kg/m  04/11/18 18.48 kg/m   Wt Readings from Last 4 Encounters:  07/24/19 103 lb 12.8 oz (47.1 kg)  06/06/19 102 lb (46.3 kg)  05/30/19 99 lb 3.3 oz (45 kg)  04/11/18 98 lb 9.6 oz (44.7 kg)  Psych/Mental status: Alert, oriented x 3 (person, place, & time)       Eyes: PERLA Respiratory: No evidence of acute respiratory distress  Cervical Spine Area Exam  Skin & Axial Inspection: No masses, redness, edema, swelling, or associated skin lesions Alignment: Symmetrical Functional ROM: Unrestricted ROM       Stability: No instability detected Muscle Tone/Strength: Functionally intact. No obvious neuro-muscular anomalies detected. Sensory (Neurological): Unimpaired Palpation: No palpable anomalies              Upper Extremity (UE) Exam    Side: Right upper extremity  Side: Left upper extremity  Skin & Extremity Inspection: Skin color, temperature, and hair growth are WNL. No peripheral edema or cyanosis. No masses, redness, swelling, asymmetry, or associated skin lesions. No contractures.  Skin & Extremity Inspection: Skin color, temperature, and hair growth are WNL. No peripheral edema or cyanosis. No masses, redness, swelling, asymmetry, or associated skin lesions. No contractures.  Functional ROM: Unrestricted ROM          Functional ROM: Unrestricted ROM          Muscle Tone/Strength: Functionally intact. No obvious neuro-muscular anomalies detected.  Muscle Tone/Strength: Functionally intact. No obvious neuro-muscular anomalies detected.  Sensory (Neurological): Paresthesia (Burning sensation)          Sensory (Neurological): Paresthesia (Burning sensation)          Palpation: No palpable anomalies              Palpation: No palpable anomalies              Provocative Test(s):  Phalen's test: deferred Tinel's test: deferred Apley's scratch test (touch opposite shoulder):  Action 1 (Across chest): deferred Action 2 (Overhead): deferred Action 3 (LB reach): deferred   Provocative Test(s):  Phalen's test: deferred Tinel's test: deferred Apley's scratch test (touch opposite shoulder):  Action 1 (Across chest): deferred Action 2 (Overhead): deferred Action 3 (  LB reach): deferred    Thoracic Spine Area Exam  Skin & Axial Inspection: No masses, redness, or swelling Alignment: Symmetrical Functional ROM: Unrestricted ROM Stability: No instability detected Muscle Tone/Strength: Functionally intact. No obvious neuro-muscular anomalies detected. Sensory (Neurological): Unimpaired Muscle  strength & Tone: No palpable anomalies  Lumbar Spine Area Exam  Skin & Axial Inspection: No masses, redness, or swelling Alignment: Symmetrical Functional ROM: Unrestricted ROM       Stability: No instability detected Muscle Tone/Strength: Functionally intact. No obvious neuro-muscular anomalies detected. Sensory (Neurological): Unimpaired Palpation: No palpable anomalies       Provocative Tests: Hyperextension/rotation test: deferred today       Lumbar quadrant test (Kemp's test): deferred today       Lateral bending test: deferred today       Patrick's Maneuver: deferred today                   FABER* test: deferred today                   S-I anterior distraction/compression test: deferred today         S-I lateral compression test: deferred today         S-I Thigh-thrust test: deferred today         S-I Gaenslen's test: deferred today         *(Flexion, ABduction and External Rotation)  Gait & Posture Assessment  Ambulation: Unassisted Gait: Relatively normal for age and body habitus Posture: WNL   Lower Extremity Exam    Side: Right lower extremity  Side: Left lower extremity  Stability: No instability observed          Stability: No instability observed          Skin & Extremity Inspection: Skin color, temperature, and hair growth are WNL. No peripheral edema or cyanosis. No masses, redness, swelling, asymmetry, or associated skin lesions. No contractures.  Skin & Extremity Inspection: Skin color, temperature, and hair growth are WNL. No peripheral edema or cyanosis. No masses, redness, swelling, asymmetry, or associated skin lesions. No contractures.  Functional ROM: Unrestricted ROM                  Functional ROM: Unrestricted ROM                  Muscle Tone/Strength: Functionally intact. No obvious neuro-muscular anomalies detected.  Muscle Tone/Strength: Functionally intact. No obvious neuro-muscular anomalies detected.  Sensory (Neurological): Paresthesia (Burning  sensation)        Sensory (Neurological): Paresthesia (Burning sensation)        DTR: Patellar: 2+: normal Achilles: 2+: normal Plantar: deferred today  DTR: Patellar: 2+: normal Achilles: 2+: normal Plantar: deferred today  Palpation: No palpable anomalies  Palpation: No palpable anomalies   Assessment  Primary Diagnosis & Pertinent Problem List: The primary encounter diagnosis was Small fiber neuropathy. Diagnoses of Idiopathic progressive neuropathy, Paresthesias, and Chronic pain syndrome were also pertinent to this visit.  Visit Diagnosis (New problems to examiner): 1. Small fiber neuropathy   2. Idiopathic progressive neuropathy   3. Paresthesias   4. Chronic pain syndrome    Plan of Care    1.  Discontinue gabapentin.  Start Lyrica as below.  Titration instructions provided. 2.  Continue Cymbalta 40 mg as prescribed by neurology 3.  Start alpha lipoic acid 600 mg daily 4.  Consideration of lidocaine infusion for small fiber neuropathy.  QTc 456 ms.  Future medication considerations:  TCA, compounded cream, low-dose tramadol  Pharmacotherapy (current): Medications ordered:  Meds ordered this encounter  Medications  . pregabalin (LYRICA) 50 MG capsule    Sig: Take 1 capsule (50 mg total) by mouth at bedtime for 30 days, THEN 2 capsules (100 mg total) at bedtime.    Dispense:  90 capsule    Refill:  0    Do not place this medication, or any other prescription from our practice, on "Automatic Refill". Patient may have prescription filled one day early if pharmacy is closed on scheduled refill date.  . Alpha-Lipoic Acid 600 MG CAPS    Sig: Take 1 capsule (600 mg total) by mouth daily.    Dispense:  30 capsule    Refill:  1    Do not place medication on "Automatic Refill". Fill one day early if pharmacy is closed on scheduled refill date.   Medications administered during this visit: Aune Adami had no medications administered during this visit.    Provider-requested  follow-up: Return in about 8 weeks (around 09/18/2019) for Medication Management, virtual.  Future Appointments  Date Time Provider [place]  09/16/2019  2:45 PM Gillis Santa, MD [facility] None    Primary Care Physician: [name] Location: [facility] Note by: Gillis Santa, MD Date: 07/24/2019; Time: 1:02 PM  Note: This dictation was prepared with Dragon dictation. Any transcriptional errors that may result from this process are unintentional.

## 2019-07-24 NOTE — Patient Instructions (Addendum)
Alpha Lipoic Acid and Lyrica have been escribed to your pharmacy.

## 2019-07-24 NOTE — Progress Notes (Signed)
Safety precautions to be maintained throughout the outpatient stay will include: orient to surroundings, keep bed in low position, maintain call bell within reach at all times, provide assistance with transfer out of bed and ambulation.  

## 2019-09-11 ENCOUNTER — Other Ambulatory Visit: Payer: Self-pay | Admitting: Student in an Organized Health Care Education/Training Program

## 2019-09-15 ENCOUNTER — Encounter: Payer: Self-pay | Admitting: Student in an Organized Health Care Education/Training Program

## 2019-09-16 ENCOUNTER — Other Ambulatory Visit: Payer: Self-pay

## 2019-09-16 ENCOUNTER — Encounter: Payer: Self-pay | Admitting: Student in an Organized Health Care Education/Training Program

## 2019-09-16 ENCOUNTER — Ambulatory Visit
Payer: BC Managed Care – PPO | Attending: Student in an Organized Health Care Education/Training Program | Admitting: Student in an Organized Health Care Education/Training Program

## 2019-09-16 DIAGNOSIS — G894 Chronic pain syndrome: Secondary | ICD-10-CM | POA: Diagnosis not present

## 2019-09-16 DIAGNOSIS — G603 Idiopathic progressive neuropathy: Secondary | ICD-10-CM | POA: Diagnosis not present

## 2019-09-16 DIAGNOSIS — R202 Paresthesia of skin: Secondary | ICD-10-CM | POA: Diagnosis not present

## 2019-09-16 DIAGNOSIS — G629 Polyneuropathy, unspecified: Secondary | ICD-10-CM | POA: Diagnosis not present

## 2019-09-16 MED ORDER — PREGABALIN 50 MG PO CAPS: ORAL_CAPSULE | ORAL | 2 refills | Status: DC

## 2019-09-16 NOTE — Progress Notes (Signed)
Virtual Encounter - Pain Management PROVIDER NOTE: Information contained herein reflects review and annotations entered in association with encounter. Patient information is provided elsewhere in the medical record. Interpretation of information and data should be left to medically trained personnel. Document created using STT technology, any transcriptional errors that may result from process are unintentional.    Contact & Pharmacy Preferred: [phone] Home: [phone] (home) Mobile: [phone] (mobile) E-mail: [email or web address]  [id number] Beth Wells, [place] [address] Phone: [phone] Fax: [phone]   Pre-screening  Beth Wells offered "in-person" vs "virtual" encounter. Beth Wells indicated preferring virtual for this encounter.   Reason COVID-19*  Social distancing based on CDC and AMA recommendations.   I contacted Beth Wells on 09/16/2019 via telephone (attempted video but poor sound quality so did telephone visit).      I clearly identified myself as Beth Santa, MD. I verified that I was speaking with the correct person using two identifiers (Name: Beth Wells, and date of birth: 09/26/1975).  Consent I sought verbal advanced consent from Beth Wells for virtual visit interactions. I informed Beth Wells of possible security and privacy concerns, risks, and limitations associated with providing "not-in-person" medical evaluation and management services. I also informed Beth Wells of the availability of "in-person" appointments. Finally, I informed her that there would be a charge for the virtual visit and that Beth Wells could be  personally, fully or partially, financially responsible for it. Beth Wells expressed understanding and agreed to proceed.   Historic Elements   Beth Wells is a 43 y.o. year old, female patient evaluated today after her last encounter by our practice on 09/11/2019. Beth Wells  has a past medical history of  H/O foot surgery (08/2018) and Hypertension. Beth Wells also  has a past surgical history that includes Kidney stone surgery; Cervical cone biopsy; Dilation and evacuation (N/A, 03/12/2018); laparoscopy (N/A, 06/07/2019); and Laparoscopic ovarian cystectomy (Right, 06/07/2019). Beth Wells has a current medication list which includes the following prescription(s): alpha-lipoic acid, amphetamine-dextroamphetamine, vitamin c, cholecalciferol, duloxetine, ibuprofen, lisinopril, pregabalin, probiotic product, amphetamine-dextroamphetamine, and amphetamine-dextroamphetamine. Beth Wells  reports that Beth Wells has never smoked. Beth Wells has never used smokeless tobacco. Beth Wells reports previous alcohol use. Beth Wells reports previous drug use. Beth Wells has No Known Allergies.   HPI  Today, Beth Wells is being contacted for medication management.   Finding benefit with Lyrica compared to Gabapentin. States that it helping with her sx, no side effects noted with 100 mg at bedtime Recommend patient increase to 50 mg daily and continue 100 mg at bedtime, Beth Wells is in agreement with plan Risk and benefits discussed Did see Rheumatology, nothing they could offer Pt states that pain is more intense during their menstrual cycle  Laboratory Chemistry Profile (12 mo)  Renal: 06/07/2019: BUN 16; Creatinine, Ser 0.84  Lab Results  Component Value Date   GFRAA >60 06/07/2019   GFRNONAA >60 06/07/2019   Hepatic: 05/30/2019: Albumin 4.5 Lab Results  Component Value Date   AST 21 05/30/2019   ALT 15 05/30/2019   Other: No results found for requested labs within last 8760 hours. Note: Above Lab results reviewed.  Imaging  US PELVIC COMPLETE W TRANSVAGINAL AND TORSION R/O CLINICAL DATA:  Lower abdominal pain  EXAM: TRANSABDOMINAL AND TRANSVAGINAL ULTRASOUND OF PELVIS  DOPPLER ULTRASOUND OF OVARIES  TECHNIQUE: Both transabdominal and transvaginal ultrasound examinations of the pelvis were performed. Transabdominal technique was performed for global  imaging of the pelvis including uterus, ovaries, adnexal regions,  and pelvic cul-de-sac.  It was necessary to proceed with endovaginal exam following the transabdominal exam to visualize the uterus, endometrium, ovaries and adnexa. Color and duplex Doppler ultrasound was utilized to evaluate blood flow to the ovaries.  COMPARISON:  CT 05/30/2019  FINDINGS: Uterus  Measurements: 7.6 x 3.7 x 4.6 cm = volume: 65.9 mL. No fibroids or other mass visualized.  Endometrium  Thickness: 7 mm in thickness.  No focal abnormality visualized.  Right ovary  Measurements: 7.6 x 5.7 x 4.8 cm = volume: 110 mL. 7.3 x 4.2 x 4.3 cm complex cyst within the right ovary with low level internal echoes. Appearance most suggestive of endometrioma.  Left ovary  Measurements: 3.2 x 2.8 x 1.9 cm = volume: 8.6 mL. Normal appearance/no adnexal mass.  Pulsed Doppler evaluation of both ovaries demonstrates normal low-resistance arterial and venous waveforms.  Other findings  Trace free fluid in the left adnexa.  IMPRESSION: 7.3 x 4.3 x 4.2 cm cystic mass within the right ovary with low level internal echoes. Appearance is most suggestive of endometrioma. No evidence of torsion.  Electronically Signed   By: Rolm Baptise M.D.   On: 05/30/2019 23:54 CT ABDOMEN PELVIS W CONTRAST CLINICAL DATA:  Abdominal pain and diverticulitis.  EXAM: CT ABDOMEN AND PELVIS WITH CONTRAST  TECHNIQUE: Multidetector CT imaging of the abdomen and pelvis was performed using the standard protocol following bolus administration of intravenous contrast.  CONTRAST:  155mL OMNIPAQUE IOHEXOL 300 MG/ML  SOLN  COMPARISON:  None.  FINDINGS: Lower chest: The visualized heart size within normal limits. No pericardial fluid/thickening.  No hiatal hernia.  The visualized portions of the lungs are clear.  Hepatobiliary: There is a 1.2 cm septated low-density lesion seen within the anterior right liver lobe. Main portal  vein is patent. No evidence of calcified gallstones, gallbladder wall thickening or biliary dilatation.  Pancreas: Unremarkable. No pancreatic ductal dilatation or surrounding inflammatory changes.  Spleen: Normal in size without focal abnormality.  Adrenals/Urinary Tract: Both adrenal glands appear normal. The kidneys and collecting system appear normal without evidence of urinary tract calculus or hydronephrosis. Bladder is unremarkable.  Stomach/Bowel: The stomach, small bowel, and colon are normal in appearance. No inflammatory changes, wall thickening, or obstructive findings.The appendix is normal.  Vascular/Lymphatic: There are no enlarged mesenteric, retroperitoneal, or pelvic lymph nodes. No significant vascular findings are present.  Reproductive: Within the right adnexa there is a 5 cm low-density cystic mass which appears to be likely ovarian displacing the uterus. Within the left ovary there is a peripherally enhancing probable corpus luteum cyst. There is fluid seen within the endometrial canal. A small amount of free fluid seen within the cul-de-sac.  Other: No evidence of abdominal wall mass or hernia.  Musculoskeletal: No acute or significant osseous findings.  IMPRESSION: 1. 5 cm probable right ovarian cyst. If further evaluation is required would recommend pelvic ultrasound. 2. Left corpus luteum cyst 3. Small amount of free fluid in the endometrial canal and cul-de-sac 4. No evidence of diverticulitis.  Electronically Signed   By: Prudencio Pair M.D.   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  Plan of Care  Problem-specific:  No problem-specific Assessment & Plan notes found for this encounter.  I have changed Curt Bears Cornman's pregabalin. I am also having her maintain her  amphetamine-dextroamphetamine, lisinopril, DULoxetine, amphetamine-dextroamphetamine, vitamin C, Cholecalciferol (VITAMIN  D3 PO), ibuprofen, Probiotic Product (PROBIOTIC DAILY PO), amphetamine-dextroamphetamine, and Alpha-Lipoic Acid.  Pharmacotherapy (Medications Ordered): Meds ordered this encounter  Medications  . pregabalin (LYRICA) 50 MG capsule    Sig: 50 mg qAM, 100 mg at bedtime    Dispense:  90 capsule    Refill:  2    Do not place this medication, or any other prescription from our practice, on "Automatic Refill". Patient may have prescription filled one day early if pharmacy is closed on scheduled refill date.   Follow-up plan:   Return in about 8 weeks (around 11/11/2019) for Medication Management, virtual.    Recent Visits Date Type Provider Dept  07/24/19 Office Visit Beth Santa, MD [facility]  Showing recent visits within past 90 days and meeting all other requirements   Today's Visits Date Type Provider Dept  09/16/19 Office Visit Beth Santa, MD [facility]  Showing today's visits and meeting all other requirements   Future Appointments No visits were found meeting these conditions.  Showing future appointments within next 90 days and meeting all other requirements   I discussed the assessment and treatment plan with the patient. The patient was provided an opportunity to ask questions and all were answered. The patient agreed with the plan and demonstrated an understanding of the instructions.  Patient advised to call back or seek an in-person evaluation if the symptoms or condition worsens.  Total duration of non-face-to-face encounter: 15 minutes.  Note by: Beth Santa, MD Date: 09/16/2019; Time: 3:22 PM

## 2019-11-07 IMAGING — US US MFM OB COMPLETE +14 WKS
1 series · 13 of 28 positions shown · non-contrast
Comparison: none

PATIENT INFO:

PERFORMED BY:
GOLUB
GASPARD
SERVICE(S) PROVIDED:
INDICATIONS:
11 weeks gestation of pregnancy
AMA
Hypertension
Adderal use
FETAL EVALUATION:
Num Of Fetuses:     1
Cardiac Activity:   Fetal demise
Presentation:       N/A
Placenta:           Anterior
BIOMETRY:
CRL:      24.9  mm     G. Age:  9w 1d                   EDD:   10/13/18
GESTATIONAL AGE:
LMP:           11w 3d        Date:  12/21/17                 EDD:   09/27/18
Best:          11w 3d     Det. By:  LMP  (12/21/17)          EDD:   09/27/18
ANATOMY:
Other:  Fetal demise
CERVIX UTERUS ADNEXA:
Cervix
Length:           3.12  cm.
Uterus
Size(cm)    10.99   x   8.03   x  6.26      Vol(ml):

[Series 1: us mfm ob complete +14 wks · 0.20mm/px · 13 of 46 slices shown]
[im 2/46]
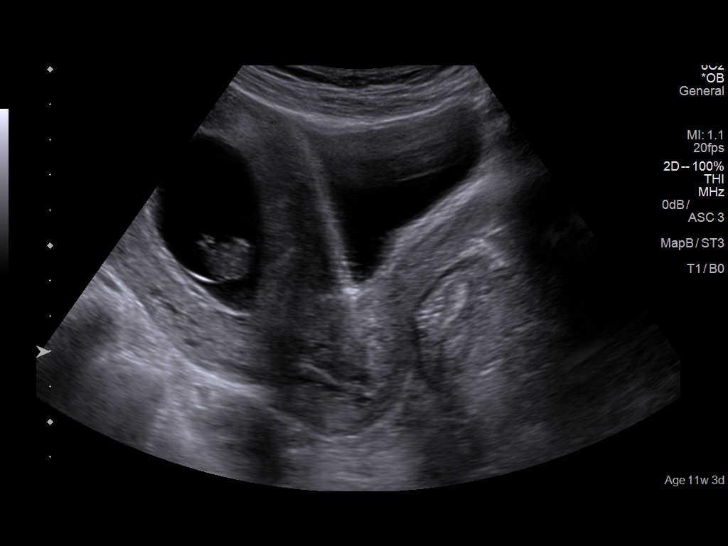
[im 6/46]
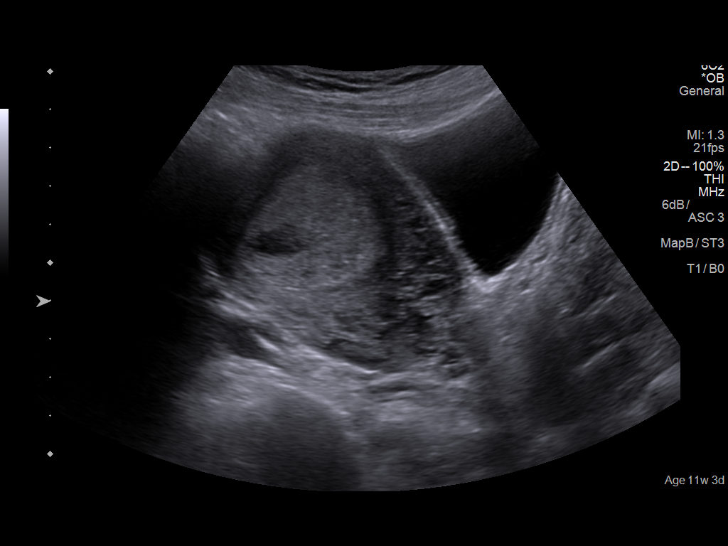
[im 9/46]
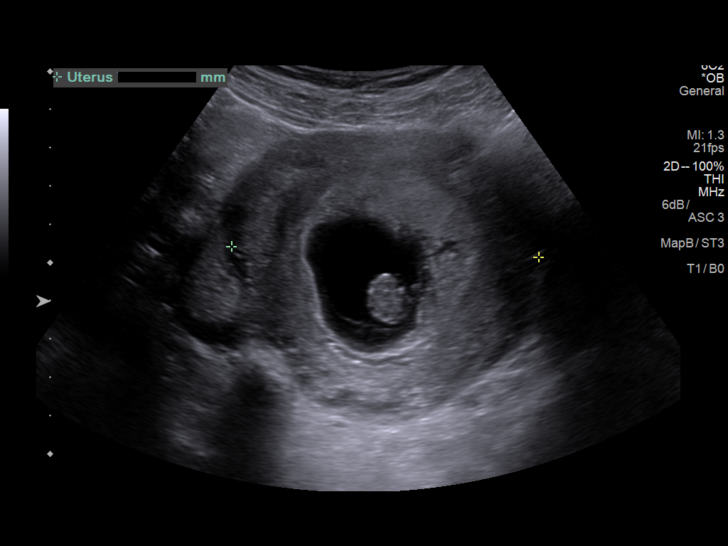
[im 12/46]
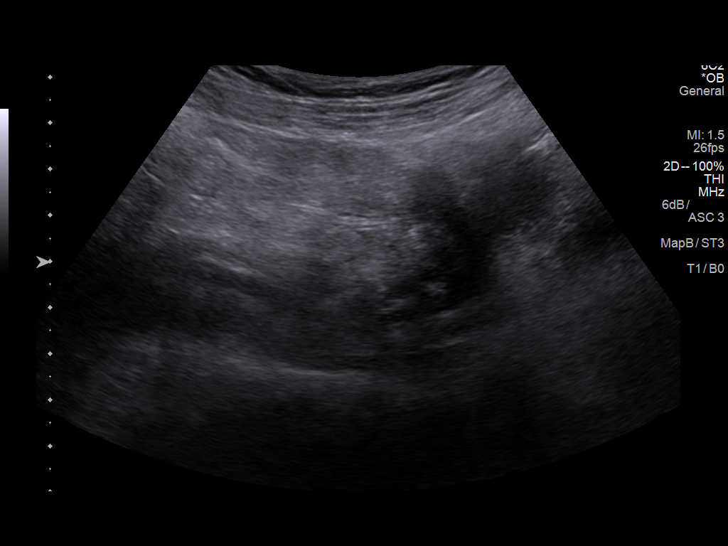
[im 16/46]
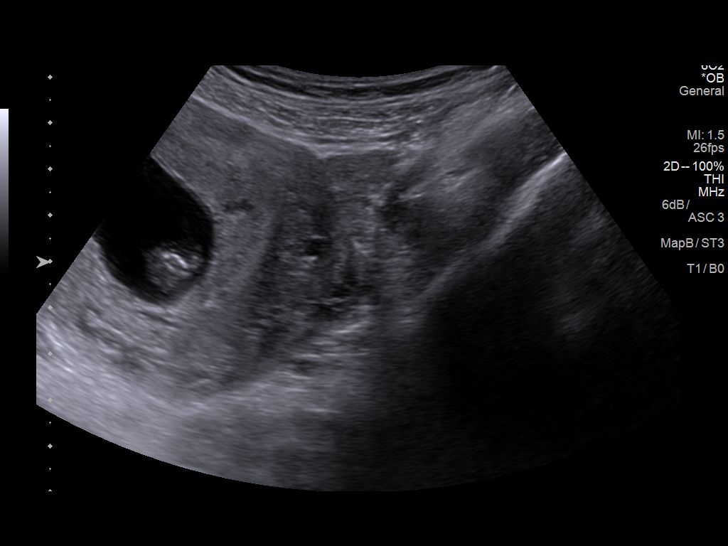
[im 19/46]
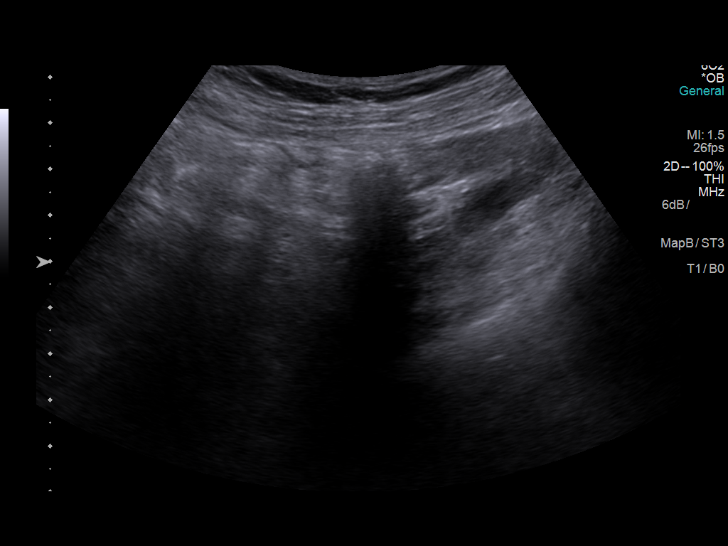
[im 24/46]
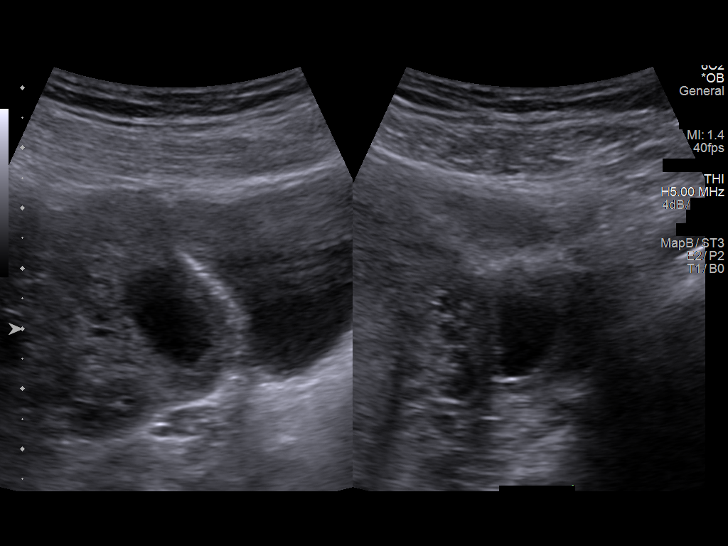
[im 27/46]
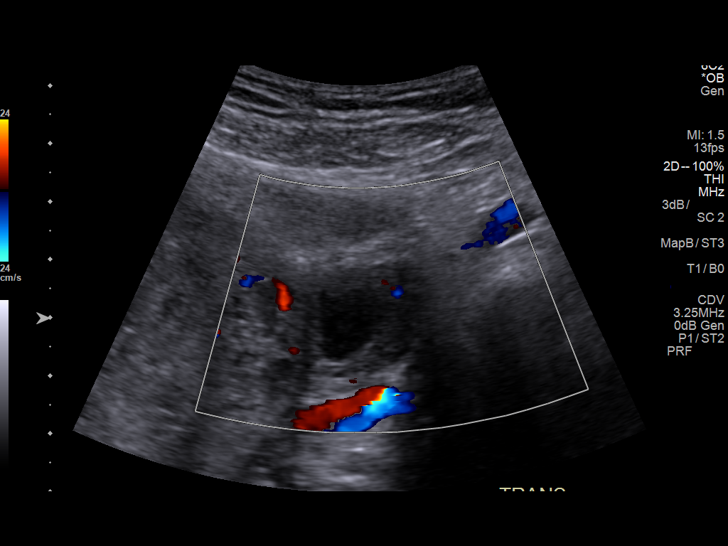
[im 31/46]
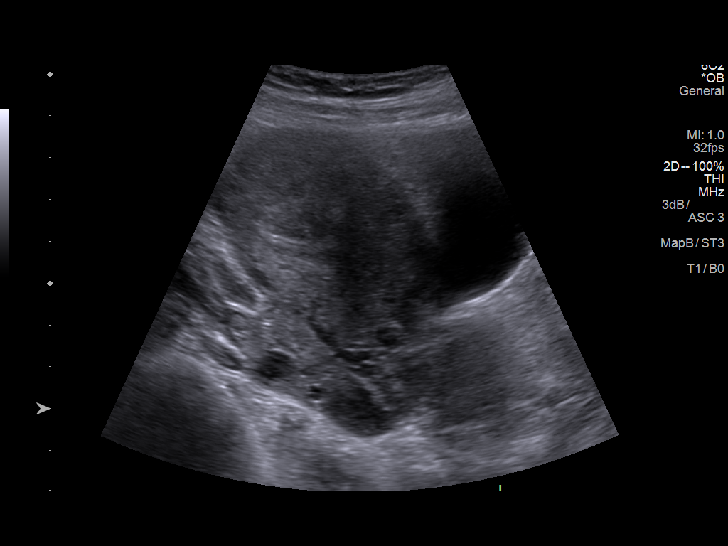
[im 34/46]
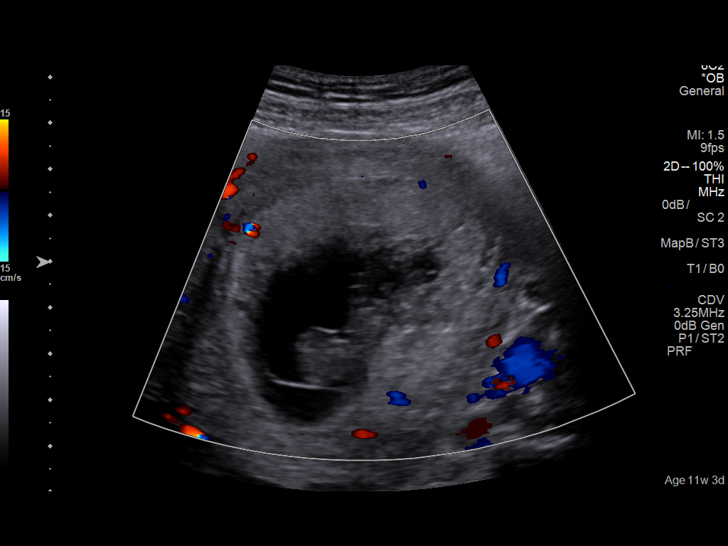
[im 37/46]
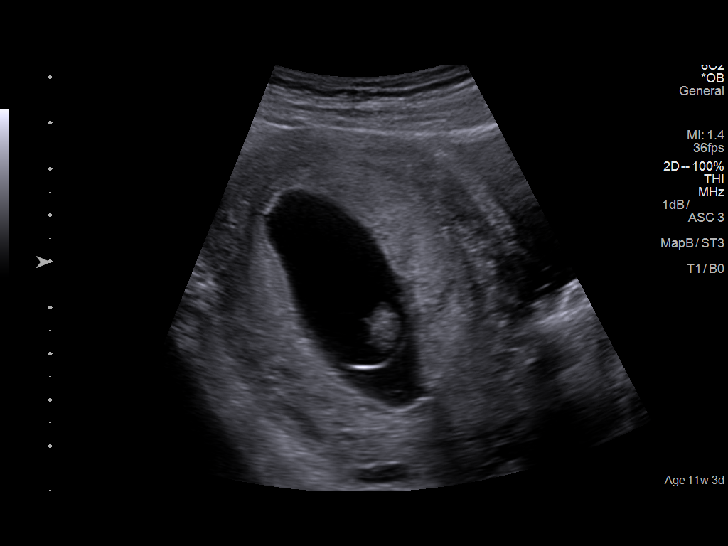
[im 41/46]
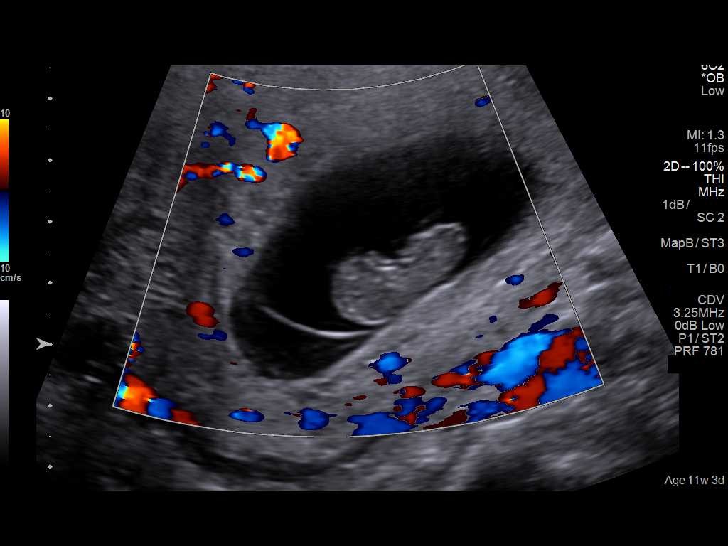
[im 44/46]
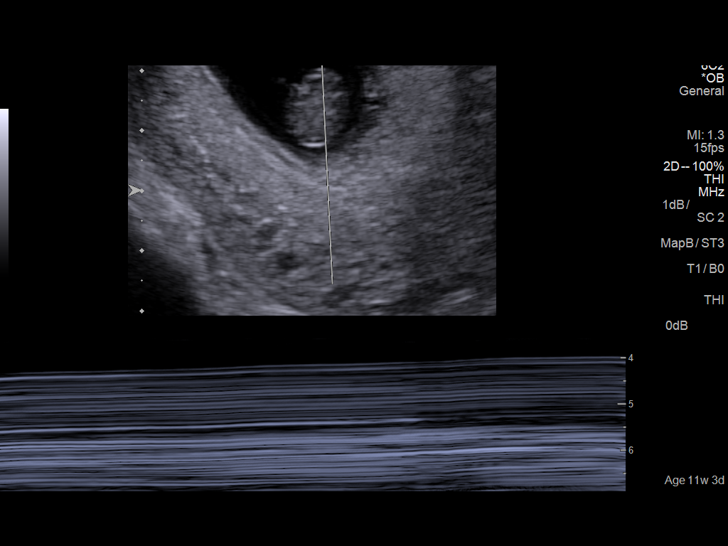

[13 of 28 positions shown; findings below may reference images not displayed]

IMPRESSION: Ms. Awa presents for genetic counseling and first trimester
ultrasound.

Ultrasound demonstrates a fetal demise with a CRL
measuring 9 [DATE] weeks.  No cardiac activity nor fetal
movement is seen. She should be 11 [DATE] weeks by LMP
consistent with earliest available ultrasound performed at
[REDACTED] on 02/13/18 with measurements of 7 [DATE]
weeks.  The amniotic fluid volume appears normal.  The
nuchal region has a normal appearance.  Examination of fetal
anatomy is limited by early gestatioanl age.  The uterus is
antiverted and the cervix has a normal appearance.  The right
ovary is seen and has a normal appearance.  The left ovary
is seen and has a normal appearance and contains a single,
small simple cyst, which measures 1.0 x 1.7 x 1.0 cm.

The findings were discussed. Ms. Jumper has not had any
vaginal bleeding or cramping. She desires D&C and genetic
testing on the products of conception.  [REDACTED] was
contacted and they will contact Ms. Awa to schedule.

## 2019-11-10 ENCOUNTER — Encounter: Payer: Self-pay | Admitting: Student in an Organized Health Care Education/Training Program

## 2019-11-11 ENCOUNTER — Encounter: Payer: Self-pay | Admitting: Student in an Organized Health Care Education/Training Program

## 2019-11-11 ENCOUNTER — Other Ambulatory Visit: Payer: Self-pay

## 2019-11-11 ENCOUNTER — Telehealth: Payer: Self-pay

## 2019-11-11 ENCOUNTER — Ambulatory Visit
Payer: BC Managed Care – PPO | Attending: Student in an Organized Health Care Education/Training Program | Admitting: Student in an Organized Health Care Education/Training Program

## 2019-11-11 DIAGNOSIS — G603 Idiopathic progressive neuropathy: Secondary | ICD-10-CM

## 2019-11-11 DIAGNOSIS — R202 Paresthesia of skin: Secondary | ICD-10-CM

## 2019-11-11 DIAGNOSIS — G894 Chronic pain syndrome: Secondary | ICD-10-CM | POA: Diagnosis not present

## 2019-11-11 DIAGNOSIS — G629 Polyneuropathy, unspecified: Secondary | ICD-10-CM | POA: Diagnosis not present

## 2019-11-11 MED ORDER — PREGABALIN 50 MG PO CAPS: 100.0000 mg | ORAL_CAPSULE | Freq: Two times a day (BID) | ORAL | 2 refills | Status: DC

## 2019-11-11 NOTE — Telephone Encounter (Signed)
[facility], [facility] were able to [facility] prescription. Attempted to call patient to inform her of this, message left.

## 2019-11-11 NOTE — Progress Notes (Signed)
Patient: Beth Wells  Service Category: E/M  Provider: Gillis Santa, MD  DOB: 09-Mar-1976  DOS: 11/11/2019  Location: Office  MRN: [id number]  Setting: Ambulatory outpatient  Referring Provider: [name]  Type: Established Patient  Specialty: Interventional Pain Management  PCP: Beth Wells  Location: Work  Delivery: TeleHealth     Virtual Encounter - Pain Management PROVIDER NOTE: Information contained herein reflects review and annotations entered in association with encounter. Interpretation of such information and data should be left to medically-trained personnel. Information provided to patient can be located elsewhere in the medical record under "Patient Instructions". Document created using STT-dictation technology, any transcriptional errors that may result from process are unintentional.    Contact & Pharmacy Preferred: [phone] Home: [phone] (home) Mobile: [phone] (mobile) E-mail: [email or web address]  [id number] [id number]: [phone]: [phone]  Pre-screening  Beth Wells offered "in-person" vs "virtual" encounter. She indicated preferring virtual for this encounter.   Reason COVID-19*  Social distancing based on CDC and AMA recommendations.   I contacted Beth Wells 11/11/2019 via telephone.      I clearly identified myself as BGillis Santa MD. I verified that I was speaking with the correct person using two identifiers (Name: Beth Wells and date of birth: [birth year].  This visit was completed via telephone due to the restrictions of the COVID-19 pandemic. All issues as above were discussed and addressed but no physical exam was performed. If it was felt that the patient should be evaluated in the office, they were directed there. The patient verbally consented to this visit. Patient was unable to complete an audio/visual visit due to Technical  difficulties and/or Lack of internet. Due to the catastrophic nature of the COVID-19 pandemic, this visit was done through audio contact only.  Location of the patient: work address (see Epic for details)  Location of the provider: office Consent I sought verbal advanced consent from Beth Wells virtual visit interactions. I informed Beth Wells of possible security and privacy concerns, risks, and limitations associated with providing "not-in-person" medical evaluation and management services. I also informed Beth Wells of the availability of "in-person" appointments. Finally, I informed her that there would be a charge for the virtual visit and that she could be  personally, fully or partially, financially responsible for it. Beth Wells understanding and agreed to proceed.   Historic Elements   Ms. KLaquiesha Piacenteis a 44l y.o. year old, female patient evaluated today after her last contact with our practice on 09/16/2019. Beth Wells has a past medical history of H/O foot surgery (08/2018) and Hypertension. She also  has a past surgical history that includes Kidney stone surgery; Cervical cone biopsy; Dilation and evacuation (N/A, 03/12/2018); laparoscopy (N/A, 06/07/2019); and Laparoscopic ovarian cystectomy (Right, 06/07/2019). Beth Wells a current medication list which includes the following prescription(s): alpha-lipoic acid, amphetamine-dextroamphetamine, vitamin c, cholecalciferol, duloxetine, lisinopril, pregabalin, probiotic product, amphetamine-dextroamphetamine, amphetamine-dextroamphetamine, and ibuprofen. She  reports that she has never smoked. She has never used smokeless tobacco. She reports previous alcohol use. She reports previous drug use. Beth Wells No Known Allergies.   HPI  Today, she is being contacted for medication management.   Pain flare after New Year, without inciting event. Has had more frequent "pain flares" - left worse than right, burning at the top of her  thighs.  Also having more pain in shoulder blader and  mid thoracic region-  Is tolerating 50 mg Lyrica during the day, 100 mg qhs. No side effects.   Laboratory Chemistry Profile   Renal Lab Results  Component Value Date   BUN 16 06/07/2019   CREATININE 0.84 06/07/2019   GFRAA >60 06/07/2019   GFRNONAA >60 06/07/2019    Hepatic Lab Results  Component Value Date   AST 21 05/30/2019   ALT 15 05/30/2019   ALBUMIN 4.5 05/30/2019   ALKPHOS 44 05/30/2019    Electrolytes Lab Results  Component Value Date   NA 137 06/07/2019   K 4.2 06/07/2019   CL 106 06/07/2019   CALCIUM 8.3 (L) 06/07/2019    Bone No results found for: VD25OH, IH038UE2CMK, LK9179XT0, VW9794IA1, 25OHVITD1, 25OHVITD2, 25OHVITD3, TESTOFREE, TESTOSTERONE  Coagulation Lab Results  Component Value Date   PLT 214 06/07/2019    Cardiovascular Lab Results  Component Value Date   HGB 12.8 06/07/2019   HCT 39.0 06/07/2019    Inflammation (CRP: Acute Phase) (ESR: Chronic Phase) No results found for: CRP, ESRSEDRATE, LATICACIDVEN    Note: Above Lab results reviewed.  Imaging  US PELVIC COMPLETE W TRANSVAGINAL AND TORSION R/O CLINICAL DATA:  Lower abdominal pain  EXAM: TRANSABDOMINAL AND TRANSVAGINAL ULTRASOUND OF PELVIS  DOPPLER ULTRASOUND OF OVARIES  TECHNIQUE: Both transabdominal and transvaginal ultrasound examinations of the pelvis were performed. Transabdominal technique was performed for global imaging of the pelvis including uterus, ovaries, adnexal regions, and pelvic cul-de-sac.  It was necessary to proceed with endovaginal exam following the transabdominal exam to visualize the uterus, endometrium, ovaries and adnexa. Color and duplex Doppler ultrasound was utilized to evaluate blood flow to the ovaries.  COMPARISON:  CT 05/30/2019  FINDINGS: Uterus  Measurements: 7.6 x 3.7 x 4.6 cm = volume: 65.9 mL. No fibroids or other mass visualized.  Endometrium  Thickness: 7 mm in thickness.   No focal abnormality visualized.  Right ovary  Measurements: 7.6 x 5.7 x 4.8 cm = volume: 110 mL. 7.3 x 4.2 x 4.3 cm complex cyst within the right ovary with low level internal echoes. Appearance most suggestive of endometrioma.  Left ovary  Measurements: 3.2 x 2.8 x 1.9 cm = volume: 8.6 mL. Normal appearance/no adnexal mass.  Pulsed Doppler evaluation of both ovaries demonstrates normal low-resistance arterial and venous waveforms.  Other findings  Trace free fluid in the left adnexa.  IMPRESSION: 7.3 x 4.3 x 4.2 cm cystic mass within the right ovary with low level internal echoes. Appearance is most suggestive of endometrioma. No evidence of torsion.  Electronically Signed   By: Rolm Baptise M.D.   On: 05/30/2019 23:54 CT ABDOMEN PELVIS W CONTRAST CLINICAL DATA:  Abdominal pain and diverticulitis.  EXAM: CT ABDOMEN AND PELVIS WITH CONTRAST  TECHNIQUE: Multidetector CT imaging of the abdomen and pelvis was performed using the standard protocol following bolus administration of intravenous contrast.  CONTRAST:  12m OMNIPAQUE IOHEXOL 300 MG/ML  SOLN  COMPARISON:  None.  FINDINGS: Lower chest: The visualized heart size within normal limits. No pericardial fluid/thickening.  No hiatal hernia.  The visualized portions of the lungs are clear.  Hepatobiliary: There is a 1.2 cm septated low-density lesion seen within the anterior right liver lobe. Main portal vein is patent. No evidence of calcified gallstones, gallbladder wall thickening or biliary dilatation.  Pancreas: Unremarkable. No pancreatic ductal dilatation or surrounding inflammatory changes.  Spleen: Normal in size without focal abnormality.  Adrenals/Urinary Tract: Both adrenal glands appear normal. The kidneys and collecting system appear normal without  evidence of urinary tract calculus or hydronephrosis. Bladder is unremarkable.  Stomach/Bowel: The stomach, small bowel, and colon are normal  in appearance. No inflammatory changes, wall thickening, or obstructive findings.The appendix is normal.  Vascular/Lymphatic: There are no enlarged mesenteric, retroperitoneal, or pelvic lymph nodes. No significant vascular findings are present.  Reproductive: Within the right adnexa there is a 5 cm low-density cystic mass which appears to be likely ovarian displacing the uterus. Within the left ovary there is a peripherally enhancing probable corpus luteum cyst. There is fluid seen within the endometrial canal. A small amount of free fluid seen within the cul-de-sac.  Other: No evidence of abdominal wall mass or hernia.  Musculoskeletal: No acute or significant osseous findings.  IMPRESSION: 1. 5 cm probable right ovarian cyst. If further evaluation is required would recommend pelvic ultrasound. 2. Left corpus luteum cyst 3. Small amount of free fluid in the endometrial canal and cul-de-sac 4. No evidence of diverticulitis.  Electronically Signed   By: Bindu  Avutu M.D.   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  Plan of Wells  I have changed Littie Tokarczyk's pregabalin. I am also having her maintain her amphetamine-dextroamphetamine, lisinopril, DULoxetine, amphetamine-dextroamphetamine, vitamin C, Cholecalciferol (VITAMIN D3 PO), ibuprofen, Probiotic Product (PROBIOTIC DAILY PO), amphetamine-dextroamphetamine, and Alpha-Lipoic Acid.  1. Increase Lyrica to 100 mg BID.  Pharmacotherapy (Medications Ordered): Meds ordered this encounter  Medications  . pregabalin (LYRICA) 50 MG capsule    Sig: Take 2 capsules (100 mg total) by mouth 2 (two) times daily.    Dispense:  120 capsule    Refill:  2    Do not place this medication, or any other prescription from our practice, on "Automatic Refill". Patient  may have prescription filled one day early if pharmacy is closed on scheduled refill date.   Follow-up plan:   No follow-ups on file.    Recent Visits Date Type Provider Dept  09/16/19 Office Visit Lateef, Bilal, MD [facility]  Showing recent visits within past 90 days and meeting all other requirements   Today's Visits Date Type Provider Dept  11/11/19 Office Visit Lateef, Bilal, MD [facility]  Showing today's visits and meeting all other requirements   Future Appointments No visits were found meeting these conditions.  Showing future appointments within next 90 days and meeting all other requirements   I discussed the assessment and treatment plan with the patient. The patient was provided an opportunity to ask questions and all were answered. The patient agreed with the plan and demonstrated an understanding of the instructions.  Patient advised to call back or seek an in-person evaluation if the symptoms or condition worsens.  Duration of encounter: 15 minutes.  Note by: Bilal Lateef, MD Date: 11/11/2019; Time: 1:58 PM 

## 2019-11-11 NOTE — Telephone Encounter (Signed)
Dr. Holley Raring increased her Lyrica and the pharmacy said she cant fill her script till the 23rd. If she increases like he told her to, she will run out before then. Can you adjust the script or call in extra?

## 2020-01-28 ENCOUNTER — Encounter: Payer: Self-pay | Admitting: Student in an Organized Health Care Education/Training Program

## 2020-01-29 ENCOUNTER — Other Ambulatory Visit: Payer: Self-pay

## 2020-01-29 ENCOUNTER — Ambulatory Visit
Payer: BC Managed Care – PPO | Attending: Student in an Organized Health Care Education/Training Program | Admitting: Student in an Organized Health Care Education/Training Program

## 2020-01-29 ENCOUNTER — Encounter: Payer: Self-pay | Admitting: Student in an Organized Health Care Education/Training Program

## 2020-01-29 VITALS — Wt 100.0 lb

## 2020-01-29 DIAGNOSIS — G603 Idiopathic progressive neuropathy: Secondary | ICD-10-CM

## 2020-01-29 DIAGNOSIS — R202 Paresthesia of skin: Secondary | ICD-10-CM | POA: Diagnosis not present

## 2020-01-29 DIAGNOSIS — G629 Polyneuropathy, unspecified: Secondary | ICD-10-CM

## 2020-01-29 DIAGNOSIS — G894 Chronic pain syndrome: Secondary | ICD-10-CM

## 2020-01-29 MED ORDER — PREGABALIN 100 MG PO CAPS: 100.0000 mg | ORAL_CAPSULE | Freq: Two times a day (BID) | ORAL | 0 refills | Status: DC

## 2020-01-29 NOTE — Progress Notes (Signed)
Patient: Beth Wells  Service Category: E/M  Provider: Gillis Santa, MD  DOB: [birth year]  DOS: 01/29/2020  Location: Office  MRN: [id number]  Setting: Ambulatory outpatient  Referring Provider: L[facility]  Type: Established Patient  Specialty: Interventional Pain Management  PCP: Beth Wells  Location: Home  Delivery: TeleHealth     Virtual Encounter - Pain Management PROVIDER NOTE: Information contained herein reflects review and annotations entered in association with encounter. Interpretation of such information and data should be left to medically-trained personnel. Information provided to patient can be located elsewhere in the medical record under "Patient Instructions". Document created using STT-dictation technology, any transcriptional errors that may result from process are unintentional.    Contact & Pharmacy Preferred: [phone] Home: [phone] (home) Mobile: [phone] (mobile) E-mail: [email or web address]  [id number] [place] [address]Phone: [phone]: [phone]  Pre-screening  Beth Wells offered "in-person" vs "virtual" encounter. She indicated preferring virtual for this encounter.   Reason COVID-19*  Social distancing based on CDC and AMA recommendations.   I contacted Beth Wells 01/29/2020 via televisit.      I clearly identified myself as BGillis Santa MD. I verified that I was speaking with the correct person using two identifiers (Name: Beth Wells and date of birth: 701/31/1977.  Consent I sought verbal advanced consent from KBabette Relicfor virtual visit interactions. I informed Beth Wells of possible security and privacy concerns, risks, and limitations associated with providing "not-in-person" medical evaluation and management services. I also informed Beth Wells of the availability of "in-person" appointments. Finally, I informed her that there would be a charge  for the virtual visit and that she could be  personally, fully or partially, financially responsible for it. [name] understanding and agreed to proceed.   Historic Elements   [name] a 44y.o. year old, female patient evaluated today after her last contact with our practice on 11/11/2019. Ms. LGange has a past medical history of H/O foot surgery (08/2018) and Hypertension. She also  has a past surgical history that includes Kidney stone surgery; Cervical cone biopsy; Dilation and evacuation (N/A, 03/12/2018); laparoscopy (N/A, 06/07/2019); and Laparoscopic ovarian cystectomy (Right, 06/07/2019). Ms. LKosahas a current medication list which includes the following prescription(s): alpha-lipoic acid, amphetamine-dextroamphetamine, vitamin c, duloxetine, ibuprofen, lisinopril, probiotic product, trolamine salicylate, amphetamine-dextroamphetamine, amphetamine-dextroamphetamine, cholecalciferol, and pregabalin. She  reports that she has never smoked. She has never used smokeless tobacco. She reports previous alcohol use. She reports previous drug use. Ms. LDechellishas No Known Allergies.   HPI  Today, she is being contacted for medication management.  Continues Lyrica as prescribed 100 mg BID.  She continues to have burning severe pain in bilateral feet.  She seems to think that there is an occupational exposure associated with her pain.  She states that she was at her parents house for 3 to 4 days when her sister was in town and did not have any pain in her feet at that time.  She states that after the COVID-19 vaccine, she was not feeling very well and decided to stay home and that day she did not have any pain in her feet.  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  She does utilize Aspercreme which she states is somewhat helpful.  We discussed a custom compounded cream if the Aspercreme is not beneficial.  Patient will see me back in approximately 2-2 and half  months.  Laboratory Chemistry Profile   Renal Lab Results  Component Value Date   BUN 16 06/07/2019   CREATININE 0.84 06/07/2019   GFRAA >60 06/07/2019   GFRNONAA >60 06/07/2019     Hepatic Lab Results  Component Value Date   AST 21 05/30/2019   ALT 15 05/30/2019   ALBUMIN 4.5 05/30/2019   ALKPHOS 44 05/30/2019     Electrolytes Lab Results  Component Value Date   NA 137 06/07/2019   K 4.2 06/07/2019   CL 106 06/07/2019   CALCIUM 8.3 (L) 06/07/2019     Bone No results found for: VD25OH, VD125OH2TOT, IB7048GQ9, VQ9450TU8, 25OHVITD1, 25OHVITD2, 25OHVITD3, TESTOFREE, TESTOSTERONE   Inflammation (CRP: Acute Phase) (ESR: Chronic Phase) No results found for: CRP, ESRSEDRATE, LATICACIDVEN     Note: Above Lab results reviewed.  Imaging  US PELVIC COMPLETE W TRANSVAGINAL AND TORSION R/O CLINICAL DATA:  Lower abdominal pain  EXAM: TRANSABDOMINAL AND TRANSVAGINAL ULTRASOUND OF PELVIS  DOPPLER ULTRASOUND OF OVARIES  TECHNIQUE: Both transabdominal and transvaginal ultrasound examinations of the pelvis were performed. Transabdominal technique was performed for global imaging of the pelvis including uterus, ovaries, adnexal regions, and pelvic cul-de-sac.  It was necessary to proceed with endovaginal exam following the transabdominal exam to visualize the uterus, endometrium, ovaries and adnexa. Color and duplex Doppler ultrasound was utilized to evaluate blood flow to the ovaries.  COMPARISON:  CT 05/30/2019  FINDINGS: Uterus  Measurements: 7.6 x 3.7 x 4.6 cm = volume: 65.9 mL. No fibroids or other mass visualized.  Endometrium  Thickness: 7 mm in thickness.  No focal abnormality visualized.  Right ovary  Measurements: 7.6 x 5.7 x 4.8 cm = volume: 110 mL. 7.3 x 4.2 x 4.3 cm complex cyst within the right ovary with low level internal echoes. Appearance most suggestive of endometrioma.  Left ovary  Measurements: 3.2 x 2.8 x 1.9 cm = volume: 8.6 mL.  Normal appearance/no adnexal mass.  Pulsed Doppler evaluation of both ovaries demonstrates normal low-resistance arterial and venous waveforms.  Other findings  Trace free fluid in the left adnexa.  IMPRESSION: 7.3 x 4.3 x 4.2 cm cystic mass within the right ovary with low level internal echoes. Appearance is most suggestive of endometrioma. No evidence of torsion.  Electronically Signed   By: Rolm Baptise M.D.   On: 05/30/2019 23:54 CT ABDOMEN PELVIS W CONTRAST CLINICAL DATA:  Abdominal pain and diverticulitis.  EXAM: CT ABDOMEN AND PELVIS WITH CONTRAST  TECHNIQUE: Multidetector CT imaging of the abdomen and pelvis was performed using the standard protocol following bolus administration of intravenous contrast.  CONTRAST:  148m OMNIPAQUE IOHEXOL 300 MG/ML  SOLN  COMPARISON:  None.  FINDINGS: Lower chest: The visualized heart size within normal limits. No pericardial fluid/thickening.  No hiatal hernia.  The visualized portions of the lungs are clear.  Hepatobiliary: There is a 1.2 cm septated low-density lesion seen within the anterior right liver lobe. Main portal vein is patent. No evidence of calcified gallstones, gallbladder wall thickening or biliary dilatation.  Pancreas: Unremarkable. No pancreatic ductal dilatation or surrounding inflammatory changes.  Spleen: Normal in size without focal abnormality.  Adrenals/Urinary Tract: Both adrenal glands appear normal. The kidneys and collecting system appear normal without evidence of urinary tract calculus or hydronephrosis. Bladder is unremarkable.  Stomach/Bowel: The stomach, small bowel, and colon are normal in appearance. No inflammatory changes, wall thickening, or obstructive findings.The appendix is normal.  Vascular/Lymphatic: There are  no enlarged mesenteric, retroperitoneal, or pelvic lymph nodes. No significant vascular findings are present.  Reproductive: Within the right adnexa there is a  5 cm low-density cystic mass which appears to be likely ovarian displacing the uterus. Within the left ovary there is a peripherally enhancing probable corpus luteum cyst. There is fluid seen within the endometrial canal. A small amount of free fluid seen within the cul-de-sac.  Other: No evidence of abdominal wall mass or hernia.  Musculoskeletal: No acute or significant osseous findings.  IMPRESSION: 1. 5 cm probable right ovarian cyst. If further evaluation is required would recommend pelvic ultrasound. 2. Left corpus luteum cyst 3. Small amount of free fluid in the endometrial canal and cul-de-sac 4. No evidence of diverticulitis.  Electronically Signed   By: Prudencio Pair M.D.   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  Plan of Wells   Beth Wells has a current medication list which includes the following long-term medication(s): amphetamine-dextroamphetamine, duloxetine, lisinopril, amphetamine-dextroamphetamine, amphetamine-dextroamphetamine, and pregabalin.  Continue Lyrica 100 mg twice daily.  Explore occupational exposure in association with paresthesias.  Continue Aspercreme as prescribed.  Can consider compounded topical cream in the future.  Can consider spinal cord stimulator trial in the future for lower extremity neuropathic pain, small fiber neuropathy.  Pharmacotherapy (Medications Ordered): Meds ordered this encounter  Medications  . pregabalin (LYRICA) 100 MG capsule    Sig: Take 1 capsule (100 mg total) by mouth 2 (two) times daily.    Dispense:  180 capsule    Refill:  0    Fill one day early if pharmacy is closed on scheduled refill date. May substitute for generic if available.    Follow-up plan:   Return in about 11 weeks (around 04/15/2020) for Medication Management, in  person.    Recent Visits Date Type Provider Dept  11/11/19 Office Visit Gillis Santa, MD [facility]  Showing recent visits within past 90 days and meeting all other requirements   Future Appointments No visits were found meeting these conditions.  Showing future appointments within next 90 days and meeting all other requirements   I discussed the assessment and treatment plan with the patient. The patient was provided an opportunity to ask questions and all were answered. The patient agreed with the plan and demonstrated an understanding of the instructions.  Patient advised to call back or seek an in-person evaluation if the symptoms or condition worsens.  Duration of encounter: 25 minutes.  Note by: Gillis Santa, MD Date: 01/29/2020; Time: 4:03 PM

## 2020-04-27 ENCOUNTER — Other Ambulatory Visit: Payer: Self-pay | Admitting: Neurology

## 2020-04-27 DIAGNOSIS — R292 Abnormal reflex: Secondary | ICD-10-CM

## 2020-04-27 DIAGNOSIS — G608 Other hereditary and idiopathic neuropathies: Secondary | ICD-10-CM

## 2020-05-09 DIAGNOSIS — G608 Other hereditary and idiopathic neuropathies: Secondary | ICD-10-CM | POA: Insufficient documentation

## 2020-05-10 ENCOUNTER — Other Ambulatory Visit: Payer: Self-pay | Admitting: Medical Oncology

## 2020-05-10 DIAGNOSIS — Z1231 Encounter for screening mammogram for malignant neoplasm of breast: Secondary | ICD-10-CM

## 2020-05-13 ENCOUNTER — Ambulatory Visit
Payer: BC Managed Care – PPO | Attending: Student in an Organized Health Care Education/Training Program | Admitting: Student in an Organized Health Care Education/Training Program

## 2020-05-13 ENCOUNTER — Encounter: Payer: Self-pay | Admitting: Student in an Organized Health Care Education/Training Program

## 2020-05-13 ENCOUNTER — Other Ambulatory Visit: Payer: Self-pay

## 2020-05-13 DIAGNOSIS — R202 Paresthesia of skin: Secondary | ICD-10-CM | POA: Diagnosis not present

## 2020-05-13 DIAGNOSIS — G894 Chronic pain syndrome: Secondary | ICD-10-CM | POA: Insufficient documentation

## 2020-05-13 DIAGNOSIS — G603 Idiopathic progressive neuropathy: Secondary | ICD-10-CM | POA: Insufficient documentation

## 2020-05-13 DIAGNOSIS — G629 Polyneuropathy, unspecified: Secondary | ICD-10-CM | POA: Insufficient documentation

## 2020-05-13 MED ORDER — PREGABALIN 100 MG PO CAPS: 100.0000 mg | ORAL_CAPSULE | Freq: Two times a day (BID) | ORAL | 0 refills | Status: DC

## 2020-05-13 MED ORDER — DULOXETINE HCL 20 MG PO CPEP: 40.0000 mg | ORAL_CAPSULE | Freq: Every day | ORAL | 2 refills | Status: DC

## 2020-05-13 MED ORDER — ALPHA-LIPOIC ACID 600 MG PO CAPS: 600.0000 mg | ORAL_CAPSULE | Freq: Every day | ORAL | 0 refills | Status: AC

## 2020-05-13 NOTE — Patient Instructions (Signed)

## 2020-05-13 NOTE — Progress Notes (Signed)
PROVIDER NOTE: Information contained herein reflects review and annotations entered in association with encounter. Interpretation of such information and data should be left to medically-trained personnel. Information provided to patient can be located elsewhere in the medical record under "Patient Instructions". Document created using STT-dictation technology, any transcriptional errors that may result from process are unintentional.    Patient: Beth Wells  Service Category: E/M  Provider: Gillis Santa, MD  DOB: 01-07-1976  DOS: 05/13/2020  Specialty: Interventional Pain Management  MRN: [id number]  Setting: Ambulatory outpatient  PCP: [facility]  Type: Established Patient    Referring Provider: Langley [facility]*  Location: Office  Delivery: Face-to-face     HPI  Reason for encounter: Beth Wells, a 44 y.o. year old female, is here today for evaluation and management of her No primary diagnosis found.. [name] primary complain today is Generalized Body Aches Last encounter: Practice (Visit date not found). My last encounter with her was on Visit date not found. Pertinent problems: [name] does not have any pertinent problems on file. Pain Assessment: Severity of Chronic pain is reported as a 2 /10. Location: Generalized  /pain radiaties everywhere. Onset: More than a month ago. Quality: Burning, Tingling, Constant. Timing: Constant. Modifying factor(s): moving, heat and hot shower, TENs. Vitals:  height is _0  (1.575 m) and weight is 101 lb (45.8 kg). Her temperature is 97.8 F (36.6 C). Her blood pressure is 130/76 and her pulse is 91. Her oxygen saturation is 100%.   Patient presents today for medication management.  She continues Cymbalta and Lyrica.  Cymbalta dose is 40 mg.  She states that she does not like how it makes her feel and is not interested in dose escalation of Cymbalta.  In regards to Lyrica, she is on 100 mg twice daily.  She states that she does  sometimes wake up in the morning feeling somewhat drowsy and takes her little bit longer to get going.  She is not interested in going up on the dose of Lyrica.  We discussed titrating up alpha lipoic acid to 600 mg daily.  She states that she has discussed scrambler therapy with her neurologist.  He is also ordering a brain MRI.   ROS  Constitutional: Denies any fever or chills Gastrointestinal: No reported hemesis, hematochezia, vomiting, or acute GI distress Musculoskeletal: Denies any acute onset joint swelling, redness, loss of ROM, or weakness Neurological: Burning pain, lower extremity, upper arms  Medication Review  Alpha-Lipoic Acid, Cholecalciferol, DULoxetine, Probiotic Product, amphetamine-dextroamphetamine, lisinopril, pregabalin, trolamine salicylate, and vitamin C  History Review  Allergy: [name] has No Known Allergies. Drug: [name]  reports previous drug use. Alcohol:  reports previous alcohol use. Tobacco:  reports that she has never smoked. She has never used smokeless tobacco. Social: [name]  reports that she has never smoked. She has never used smokeless tobacco. She reports previous alcohol use. She reports previous drug use. Medical:  has a past medical history of H/O foot surgery (08/2018) and Hypertension. Surgical: [name]  has a past surgical history that includes Kidney stone surgery; Cervical cone biopsy; Dilation and evacuation (N/A, 03/12/2018); laparoscopy (N/A, 06/07/2019); and Laparoscopic ovarian cystectomy (Right, 06/07/2019). Family: family history is not on file.  Laboratory Chemistry Profile   Renal Lab Results  Component Value Date   BUN 16 06/07/2019   CREATININE 0.84 06/07/2019   GFRAA >60 06/07/2019   GFRNONAA >60 06/07/2019     Hepatic Lab Results  Component  Value Date   AST 21 05/30/2019   ALT 15 05/30/2019   ALBUMIN 4.5 05/30/2019   ALKPHOS 44 05/30/2019     Electrolytes Lab Results  Component Value Date   NA 137  06/07/2019   K 4.2 06/07/2019   CL 106 06/07/2019   CALCIUM 8.3 (L) 06/07/2019     Bone No results found for: VD25OH, VD125OH2TOT, [id number], [id number], 25OHVITD1, 25OHVITD2, 25OHVITD3, TESTOFREE, TESTOSTERONE   Inflammation (CRP: Acute Phase) (ESR: Chronic Phase) No results found for: CRP, ESRSEDRATE, LATICACIDVEN     Note: Above Lab results reviewed.  Recent Imaging Review  US PELVIC COMPLETE W TRANSVAGINAL AND TORSION R/O CLINICAL DATA:  Lower abdominal pain  EXAM: TRANSABDOMINAL AND TRANSVAGINAL ULTRASOUND OF PELVIS  DOPPLER ULTRASOUND OF OVARIES  TECHNIQUE: Both transabdominal and transvaginal ultrasound examinations of the pelvis were performed. Transabdominal technique was performed for global imaging of the pelvis including uterus, ovaries, adnexal regions, and pelvic cul-de-sac.  It was necessary to proceed with endovaginal exam following the transabdominal exam to visualize the uterus, endometrium, ovaries and adnexa. Color and duplex Doppler ultrasound was utilized to evaluate blood flow to the ovaries.  COMPARISON:  CT 05/30/2019  FINDINGS: Uterus  Measurements: 7.6 x 3.7 x 4.6 cm = volume: 65.9 mL. No fibroids or other mass visualized.  Endometrium  Thickness: 7 mm in thickness.  No focal abnormality visualized.  Right ovary  Measurements: 7.6 x 5.7 x 4.8 cm = volume: 110 mL. 7.3 x 4.2 x 4.3 cm complex cyst within the right ovary with low level internal echoes. Appearance most suggestive of endometrioma.  Left ovary  Measurements: 3.2 x 2.8 x 1.9 cm = volume: 8.6 mL. Normal appearance/no adnexal mass.  Pulsed Doppler evaluation of both ovaries demonstrates normal low-resistance arterial and venous waveforms.  Other findings  Trace free fluid in the left adnexa.  IMPRESSION: 7.3 x 4.3 x 4.2 cm cystic mass within the right ovary with low level internal echoes. Appearance is most suggestive of endometrioma. No evidence of  torsion.  Electronically Signed   By: Rolm Baptise M.D.   On: 05/30/2019 23:54 CT ABDOMEN PELVIS W CONTRAST CLINICAL DATA:  Abdominal pain and diverticulitis.  EXAM: CT ABDOMEN AND PELVIS WITH CONTRAST  TECHNIQUE: Multidetector CT imaging of the abdomen and pelvis was performed using the standard protocol following bolus administration of intravenous contrast.  CONTRAST:  163m OMNIPAQUE IOHEXOL 300 MG/ML  SOLN  COMPARISON:  None.  FINDINGS: Lower chest: The visualized heart size within normal limits. No pericardial fluid/thickening.  No hiatal hernia.  The visualized portions of the lungs are clear.  Hepatobiliary: There is a 1.2 cm septated low-density lesion seen within the anterior right liver lobe. Main portal vein is patent. No evidence of calcified gallstones, gallbladder wall thickening or biliary dilatation.  Pancreas: Unremarkable. No pancreatic ductal dilatation or surrounding inflammatory changes.  Spleen: Normal in size without focal abnormality.  Adrenals/Urinary Tract: Both adrenal glands appear normal. The kidneys and collecting system appear normal without evidence of urinary tract calculus or hydronephrosis. Bladder is unremarkable.  Stomach/Bowel: The stomach, small bowel, and colon are normal in appearance. No inflammatory changes, wall thickening, or obstructive findings.The appendix is normal.  Vascular/Lymphatic: There are no enlarged mesenteric, retroperitoneal, or pelvic lymph nodes. No significant vascular findings are present.  Reproductive: Within the right adnexa there is a 5 cm low-density cystic mass which appears to be likely ovarian displacing the uterus. Within the left ovary there is a peripherally enhancing probable corpus luteum cyst. There  is fluid seen within the endometrial canal. A small amount of free fluid seen within the cul-de-sac.  Other: No evidence of abdominal wall mass or hernia.  Musculoskeletal: No acute or  significant osseous findings.  IMPRESSION: 1. 5 cm probable right ovarian cyst. If further evaluation is required would recommend pelvic ultrasound. 2. Left corpus luteum cyst 3. Small amount of free fluid in the endometrial canal and cul-de-sac 4. No evidence of diverticulitis.  Electronically Signed   By: Prudencio Pair M.D.   On: 05/30/2019 21:33 Note: Reviewed        Physical Exam  General appearance: Well nourished, well developed, and well hydrated. In no apparent acute distress Mental status: Alert, oriented x 3 (person, place, & time)       Respiratory: No evidence of acute respiratory distress Eyes: PERLA Vitals: BP 130/76   Pulse 91   Temp 97.8 F (36.6 C)   Ht _0  (1.575 m)   Wt 101 lb (45.8 kg)   SpO2 100%   BMI 18.47 kg/m  BMI: Estimated body mass index is 18.47 kg/m as calculated from the following:   Height as of this encounter: _1  (1.575 m).   Weight as of this encounter: 101 lb (45.8 kg). Ideal: Ideal body weight: 50.1 kg (110 lb 7.2 oz)   Lumbar Spine Area Exam  Skin & Axial Inspection: No masses, redness, or swelling Alignment: Symmetrical Functional ROM: Unrestricted ROM       Stability: No instability detected Muscle Tone/Strength: Functionally intact. No obvious neuro-muscular anomalies detected. Sensory (Neurological): Neurogenic pain pattern Palpation: No palpable anomalies        Gait & Posture Assessment  Ambulation: Unassisted Gait: Relatively normal for age and body habitus Posture: WNL  Lower Extremity Exam    Side: Right lower extremity  Side: Left lower extremity  Stability: No instability observed          Stability: No instability observed          Skin & Extremity Inspection: Skin color, temperature, and hair growth are WNL. No peripheral edema or cyanosis. No masses, redness, swelling, asymmetry, or associated skin lesions. No contractures.  Skin & Extremity Inspection: Skin color, temperature, and hair growth are WNL. No  peripheral edema or cyanosis. No masses, redness, swelling, asymmetry, or associated skin lesions. No contractures.  Functional ROM: Unrestricted ROM                  Functional ROM: Unrestricted ROM                  Muscle Tone/Strength: Functionally intact. No obvious neuro-muscular anomalies detected.  Muscle Tone/Strength: Functionally intact. No obvious neuro-muscular anomalies detected.  Sensory (Neurological): Neurogenic pain pattern        Sensory (Neurological): Neurogenic pain pattern        DTR: Patellar: deferred today Achilles: deferred today Plantar: deferred today  DTR: Patellar: deferred today Achilles: deferred today Plantar: deferred today  Palpation: No palpable anomalies  Palpation: No palpable anomalies    Assessment   Status Diagnosis  Persistent Persistent Persistent 1. Small fiber neuropathy   2. Idiopathic progressive neuropathy   3. Paresthesias   4. Chronic pain syndrome       Plan of Care   [name] has a current medication list which includes the following long-term medication(s): amphetamine-dextroamphetamine, duloxetine, lisinopril, and pregabalin.  Pharmacotherapy (Medications Ordered): Meds ordered this encounter  Medications  . DULoxetine (CYMBALTA) 20 MG capsule  Sig: Take 2 capsules (40 mg total) by mouth at bedtime.    Dispense:  60 capsule    Refill:  2  . pregabalin (LYRICA) 100 MG capsule    Sig: Take 1 capsule (100 mg total) by mouth 2 (two) times daily.    Dispense:  180 capsule    Refill:  0    Fill one day early if pharmacy is closed on scheduled refill date. May substitute for generic if available.  . Alpha-Lipoic Acid 600 MG CAPS    Sig: Take 1 capsule (600 mg total) by mouth daily.    Dispense:  30 capsule    Refill:  0    Do not place medication on "Automatic Refill". Fill one day early if pharmacy is closed on scheduled refill date.   Follow-up plan:   Return in about 3 months (around 08/13/2020) for  Medication Management, virtual.   Recent Visits No visits were found meeting these conditions. Showing recent visits within past 90 days and meeting all other requirements Today's Visits Date Type Provider Dept  05/13/20 Office Visit Gillis Santa, MD [facility]  Showing today's visits and meeting all other requirements Future Appointments No visits were found meeting these conditions. Showing future appointments within next 90 days and meeting all other requirements  I discussed the assessment and treatment plan with the patient. The patient was provided an opportunity to ask questions and all were answered. The patient agreed with the plan and demonstrated an understanding of the instructions.  Patient advised to call back or seek an in-person evaluation if the symptoms or condition worsens.  Duration of encounter: 30 minutes.  Note by: Gillis Santa, MD Date: 05/13/2020; Time: 9:52 AM

## 2020-05-13 NOTE — Progress Notes (Signed)
Safety precautions to be maintained throughout the outpatient stay will include: orient to surroundings, keep bed in low position, maintain call bell within reach at all times, provide assistance with transfer out of bed and ambulation.  

## 2020-05-25 ENCOUNTER — Inpatient Hospital Stay: Admission: RE | Admit: 2020-05-25 | Payer: BC Managed Care – PPO | Source: Ambulatory Visit

## 2020-06-10 ENCOUNTER — Other Ambulatory Visit: Payer: Self-pay

## 2020-06-10 ENCOUNTER — Ambulatory Visit
Admission: RE | Admit: 2020-06-10 | Discharge: 2020-06-10 | Disposition: A | Discharge status: Home / Self Care | Payer: BC Managed Care – PPO | Source: Ambulatory Visit | Attending: Medical Oncology | Admitting: Medical Oncology

## 2020-06-10 DIAGNOSIS — Z1231 Encounter for screening mammogram for malignant neoplasm of breast: Secondary | ICD-10-CM | POA: Insufficient documentation

## 2020-06-17 ENCOUNTER — Other Ambulatory Visit: Payer: Self-pay | Admitting: Medical Oncology

## 2020-06-17 DIAGNOSIS — R928 Other abnormal and inconclusive findings on diagnostic imaging of breast: Secondary | ICD-10-CM

## 2020-06-17 DIAGNOSIS — N6310 Unspecified lump in the right breast, unspecified quadrant: Secondary | ICD-10-CM

## 2020-06-23 ENCOUNTER — Ambulatory Visit
Admission: RE | Admit: 2020-06-23 | Discharge: 2020-06-23 | Disposition: A | Discharge status: Home / Self Care | Payer: BC Managed Care – PPO | Source: Ambulatory Visit | Attending: Medical Oncology | Admitting: Medical Oncology

## 2020-06-23 ENCOUNTER — Other Ambulatory Visit: Payer: Self-pay

## 2020-06-23 DIAGNOSIS — N6310 Unspecified lump in the right breast, unspecified quadrant: Secondary | ICD-10-CM | POA: Insufficient documentation

## 2020-06-23 DIAGNOSIS — R928 Other abnormal and inconclusive findings on diagnostic imaging of breast: Secondary | ICD-10-CM | POA: Diagnosis not present

## 2020-06-25 ENCOUNTER — Ambulatory Visit: Admission: RE | Admit: 2020-06-25 | Payer: BC Managed Care – PPO | Source: Ambulatory Visit

## 2020-06-28 ENCOUNTER — Other Ambulatory Visit: Payer: Self-pay | Admitting: Medical Oncology

## 2020-06-28 DIAGNOSIS — N6310 Unspecified lump in the right breast, unspecified quadrant: Secondary | ICD-10-CM

## 2020-06-28 DIAGNOSIS — R928 Other abnormal and inconclusive findings on diagnostic imaging of breast: Secondary | ICD-10-CM

## 2020-07-06 ENCOUNTER — Ambulatory Visit
Admission: RE | Admit: 2020-07-06 | Discharge: 2020-07-06 | Disposition: A | Discharge status: Home / Self Care | Payer: BC Managed Care – PPO | Source: Ambulatory Visit | Attending: Medical Oncology | Admitting: Medical Oncology

## 2020-07-06 ENCOUNTER — Other Ambulatory Visit: Payer: Self-pay | Admitting: Medical Oncology

## 2020-07-06 ENCOUNTER — Other Ambulatory Visit: Payer: Self-pay

## 2020-07-06 DIAGNOSIS — R928 Other abnormal and inconclusive findings on diagnostic imaging of breast: Secondary | ICD-10-CM | POA: Diagnosis not present

## 2020-07-06 DIAGNOSIS — N6310 Unspecified lump in the right breast, unspecified quadrant: Secondary | ICD-10-CM | POA: Diagnosis present

## 2020-07-06 HISTORY — PX: BREAST BIOPSY: SHX20

## 2020-07-08 ENCOUNTER — Encounter: Payer: Self-pay | Admitting: *Deleted

## 2020-07-08 DIAGNOSIS — C50911 Malignant neoplasm of unspecified site of right female breast: Secondary | ICD-10-CM

## 2020-07-08 NOTE — Progress Notes (Signed)
Notified by Electa Sniff at [facility] of patient's new diagnosis of invasive mammary carcinoma.  Called patient to introduce her to navigation services.  Reviewed pathology again and answered questions.  I have scheduled her an appointment to see Dr. Peyton Najjar at [facility] for surgical consult on 07/13/20 and Dr. Rogue Bussing for medical oncology consult on 07/14/20 @ 2:00.  She can pick up her educational material at her surgical consult or at her medical oncology consult.

## 2020-07-12 ENCOUNTER — Other Ambulatory Visit: Payer: Self-pay | Admitting: Anatomic Pathology & Clinical Pathology

## 2020-07-12 LAB — SURGICAL PATHOLOGY

## 2020-07-13 ENCOUNTER — Encounter: Payer: Self-pay | Admitting: Internal Medicine

## 2020-07-13 ENCOUNTER — Other Ambulatory Visit: Payer: Self-pay | Admitting: General Surgery

## 2020-07-13 DIAGNOSIS — C50411 Malignant neoplasm of upper-outer quadrant of right female breast: Secondary | ICD-10-CM | POA: Insufficient documentation

## 2020-07-13 DIAGNOSIS — Z171 Estrogen receptor negative status [ER-]: Secondary | ICD-10-CM

## 2020-07-13 HISTORY — DX: Malignant neoplasm of upper-outer quadrant of right female breast: C50.411

## 2020-07-13 HISTORY — DX: Malignant neoplasm of upper-outer quadrant of right female breast: Z17.1

## 2020-07-14 ENCOUNTER — Inpatient Hospital Stay: Payer: BC Managed Care – PPO | Attending: Internal Medicine | Admitting: Internal Medicine

## 2020-07-14 ENCOUNTER — Encounter: Payer: Self-pay | Admitting: *Deleted

## 2020-07-14 ENCOUNTER — Inpatient Hospital Stay: Payer: BC Managed Care – PPO

## 2020-07-14 ENCOUNTER — Encounter: Payer: Self-pay | Admitting: Internal Medicine

## 2020-07-14 ENCOUNTER — Other Ambulatory Visit: Payer: Self-pay

## 2020-07-14 DIAGNOSIS — C50411 Malignant neoplasm of upper-outer quadrant of right female breast: Secondary | ICD-10-CM | POA: Diagnosis present

## 2020-07-14 DIAGNOSIS — Z171 Estrogen receptor negative status [ER-]: Secondary | ICD-10-CM | POA: Diagnosis not present

## 2020-07-14 DIAGNOSIS — Z79899 Other long term (current) drug therapy: Secondary | ICD-10-CM | POA: Diagnosis not present

## 2020-07-14 DIAGNOSIS — C50911 Malignant neoplasm of unspecified site of right female breast: Secondary | ICD-10-CM

## 2020-07-14 NOTE — Assessment & Plan Note (Addendum)
#  Clinical stage I ER/PR negative HER-2 negative/triple negative breast cancer; T1 [75m];N0- no lymph nodes noted on imaging. Grade 3. awaiting MRI on 10/20 as ordered by surgery.  # I had a long discussion with the patient in general regarding the treatment options of breast cancer including-surgery; adjuvant radiation; role of adjuvant systemic therapy including-chemotherapy.  Discussed the important the role of sentinel lymph node biopsy-with lumpectomy or mastectomy. Also discussed with patient the role of genetic testing in helping her decide between lumpectomy versus mastectomy. If patient has deleterious mutations-mastectomy with immediate reconstruction would be reasonable choice. Patient is awaiting meeting with plastic surgery.  Discussed with the patient that in general lumpectomy is followed by whole breast radiation.However if patient chooses mastectomy-given early stage cancer-patient will likely not need radiation.   #Discussed the role of in general adjuvant chemotherapy in preventing distant metastatic disease. However decisions for adjuvant chemotherapy based upon final surgery/pathology. No role for endocrine therapy as patient is ER/PR negative. Patient has no children; undecided on family.   #Genetic predisposition: No close maternal family with malignancies. Father side of family unknown. Recommend genetic testing ASAP. Reached out to Breanna/genetic counselor for urgent evaluation. Patient will have blood drawn today for genetic testing.   #Small fiber neuropathy- [Dr.Shah]-currently on Cymbalta/Lyrica.  Thank you Dr. CPeyton Najjarfor allowing me to participate in the care of your pleasant patient. Please do not hesitate to contact me with questions or concerns in the interim. Discussed with Dr. CPeyton Najjar Discussed with SVita Barley breast nurse navigator.  # DISPOSITION:  # genetics blood draw today # Genetic referral- urgent- Breast cancer # follow up TBD- Dr.B

## 2020-07-14 NOTE — Progress Notes (Signed)
[facility] [place] NOTE  Patient Care Team: [place], [facility] as PCP - General  CHIEF COMPLAINTS/PURPOSE OF CONSULTATION: Breast cancer  #  Oncology History Overview Note  # OCT 2021-RIGHT BREAST- TRIPLE NEGATIVE [place]; [US/mammo-60m]; Dr.Cintron; OCT 2021-USThere is a 7 mm mass in the right breast at 10 o'clock, favored to represent a complicated cyst;  No evidence of right axillary lymphadenopathy].   # 2018-Small Fibre neuropathy [Dr.Shah]- skin biopsy- on cymblata+ Lyrica  # # SURVIVORSHIP:   # GENETICS:   DIAGNOSIS:   STAGE:         ;  GOALS:  CURRENT/MOST RECENT THERAPY :     Carcinoma of upper-outer quadrant of right breast in female, estrogen receptor negative [place]  07/13/2020 Initial Diagnosis   Carcinoma of upper-outer quadrant of right breast in female, estrogen receptor negative ([facility] [place]      HISTORY OF PRESENTING ILLNESS:  KTameika Heckmann89y.o.  female  with no prior history of breast cancer/or malignancies, with a history of neuropathy has been [place] recommendations for new diagnosis of breast cancer.   Patient states she was found to have an abnormal screening mammogram which led to diagnostic mammogram/ultrasound/followed by biopsy-as summarized above.  Patient otherwise denies any unusual joint pains bone pain. No headaches. No nausea no vomiting.   Family history of breast cancer: None on maternal side; father side unknown Family history of other cancers: None on maternal side; father side unknown   Review of Systems  Constitutional: Negative for chills, diaphoresis, fever, malaise/fatigue and weight loss.  HENT: Negative for nosebleeds and sore throat.   Eyes: Negative for double vision.  Respiratory: Negative for cough, hemoptysis, sputum production, shortness of breath and wheezing.   Cardiovascular: Negative for chest pain, palpitations, orthopnea and leg swelling.  Gastrointestinal: Negative for  abdominal pain, blood in stool, constipation, diarrhea, heartburn, melena, nausea and vomiting.  Genitourinary: Negative for dysuria, frequency and urgency.  Musculoskeletal: Negative for back pain and joint pain.  Skin: Negative.  Negative for itching and rash.  Neurological: Positive for tingling. Negative for dizziness, focal weakness, weakness and headaches.  Endo/Heme/Allergies: Does not bruise/bleed easily.  Psychiatric/Behavioral: Negative for depression. The patient is not nervous/anxious and does not have insomnia.      MEDICAL HISTORY:  Past Medical History:  Diagnosis Date  . Carcinoma of upper-outer quadrant of right breast in female, estrogen receptor negative [place] 07/13/2020  . H/O foot surgery 08/2018  . Hypertension     SURGICAL HISTORY: Past Surgical History:  Procedure Laterality Date  . BREAST BIOPSY Right 07/06/2020   u/s bx Q clip path pending  . BUNIONECTOMY    . CERVICAL CONE BIOPSY    . DILATION AND EVACUATION N/A 03/12/2018   Procedure: DILATATION AND EVACUATION;  Surgeon: BBenjaman Kindler MD;  Location: [facility] ORS;  Service: Gynecology;  Laterality: N/A;  . KIDNEY STONE SURGERY    . LAPAROSCOPIC OVARIAN CYSTECTOMY Right 06/07/2019   Procedure: LAPAROSCOPIC OVARIAN CYSTECTOMY;  Surgeon: BBenjaman Kindler MD;  Location: [facility] ORS;  Service: Gynecology;  Laterality: Right;  . LAPAROSCOPY N/A 06/07/2019   Procedure: LAPAROSCOPY DLuvenia ReddenBIOPSIES;  Surgeon: BBenjaman Kindler MD;  Location: [facility] ORS;  Service: Gynecology;  Laterality: N/A;    SOCIAL HISTORY: Social History   Socioeconomic History  . Marital status: Married    Spouse name: Not on file  . Number of children: Not on file  . Years of education: Not on file  . Highest education  level: Not on file  Occupational History  . Not on file  Tobacco Use  . Smoking status: Never Smoker  . Smokeless tobacco: Never Used  Vaping Use  . Vaping Use: Never used  Substance and Sexual Activity   . Alcohol use: Not Currently  . Drug use: Not Currently  . Sexual activity: Yes  Other Topics Concern  . 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 Lives in [place].    Social Determinants of Health   Financial Resource Strain:   . Difficulty of Paying Living Expenses: Not on file  Food Insecurity:   . Worried About Charity fundraiser in the Last Year: Not on file  . Ran Out of Food in the Last Year: Not on file  Transportation Needs:   . Lack of Transportation (Medical): Not on file  . Lack of Transportation (Non-Medical): Not on file  Physical Activity:   . Days of Exercise per Week: Not on file  . Minutes of Exercise per Session: Not on file  Stress:   . Feeling of Stress : Not on file  Social Connections:   . Frequency of Communication with Friends and Family: Not on file  . Frequency of Social Gatherings with Friends and Family: Not on file  . Attends Religious Services: Not on file  . Active Member of Clubs or Organizations: Not on file  . Attends Archivist Meetings: Not on file  . Marital Status: Not on file  Intimate Partner Violence:   . Fear of Current or Ex-Partner: Not on file  . Emotionally Abused: Not on file  . Physically Abused: Not on file  . Sexually Abused: Not on file    FAMILY HISTORY: Family History  Problem Relation Age of Onset  . Cancer Other        great aunt- GI cancer  . Breast cancer Neg Hx     ALLERGIES:  has No Known Allergies.  MEDICATIONS:  Current Outpatient Medications  Medication Sig Dispense Refill  . Ascorbic Acid (VITAMIN C) 1000 MG tablet Take 1,000 mg by mouth daily. Chewable    . DULoxetine (CYMBALTA) 20 MG capsule Take 2 capsules (40 mg total) by mouth at bedtime. 60 capsule 2  . lisinopril (ZESTRIL) 2.5 MG tablet Take 5 mg by mouth every evening.     . pregabalin (LYRICA) 100 MG capsule Take 1 capsule (100 mg total) by mouth 2  (two) times daily. 180 capsule 0  . Probiotic Product (PROBIOTIC ADVANCED PO) Take 1 capsule by mouth daily.    Derrill Memo ON 07/22/2020] amphetamine-dextroamphetamine (ADDERALL XR) 30 MG 24 hr capsule Take 1 capsule by mouth daily.    . Misc Natural Products (GLUCOSA FACTOR HIGH BLOOD GLUC) CAPS Take 1 capsule by mouth daily.     No current facility-administered medications for this visit.      Marland Kitchen  PHYSICAL EXAMINATION: ECOG PERFORMANCE STATUS: 0 - Asymptomatic  Vitals:   07/14/20 1346  BP: 131/90  Pulse: 96  Resp: 20  Temp: (!) 97.5 F (36.4 C)  SpO2: 100%   Filed Weights   07/14/20 1346  Weight: 103 lb 6.4 oz (46.9 kg)    Physical Exam HENT:     Head: Normocephalic and atraumatic.     Mouth/Throat:     Pharynx: No oropharyngeal exudate.  Eyes:     Pupils: Pupils are equal, round, and reactive to light.  Cardiovascular:  Rate and Rhythm: Normal rate and regular rhythm.  Pulmonary:     Effort: Pulmonary effort is normal. No respiratory distress.     Breath sounds: Normal breath sounds. No wheezing.  Abdominal:     General: Bowel sounds are normal. There is no distension.     Palpations: Abdomen is soft. There is no mass.     Tenderness: There is no abdominal tenderness. There is no guarding or rebound.  Musculoskeletal:        General: No tenderness. Normal range of motion.     Cervical back: Normal range of motion and neck supple.  Skin:    General: Skin is warm.     Comments: Breast exam deferred  Neurological:     Mental Status: She is alert and oriented to person, place, and time.  Psychiatric:        Mood and Affect: Affect normal.      LABORATORY DATA:  I have reviewed the data as listed Lab Results  Component Value Date   WBC 11.2 (H) 06/07/2019   HGB 12.8 06/07/2019   HCT 39.0 06/07/2019   MCV 93.5 06/07/2019   PLT 214 06/07/2019   No results for input(s): NA, K, CL, CO2, GLUCOSE, BUN, CREATININE, CALCIUM, GFRNONAA, GFRAA, PROT, ALBUMIN,  AST, ALT, ALKPHOS, BILITOT, BILIDIR, IBILI in the last 8760 hours.  RADIOGRAPHIC STUDIES: I have personally reviewed the radiological images as listed and agreed with the findings in the report. US BREAST LTD UNI RIGHT INC AXILLA  Result Date: 06/23/2020 CLINICAL DATA:  Screening recall for a possible right breast mass. EXAM: DIGITAL DIAGNOSTIC UNILATERAL RIGHT MAMMOGRAM WITH TOMO AND CAD; ULTRASOUND RIGHT BREAST LIMITED COMPARISON:  Previous exam(s). ACR Breast Density Category c: The breast tissue is heterogeneously dense, which may obscure small masses. FINDINGS: Spot compression tomosynthesis images of the right breast demonstrates a persistent ill-defined oval mass in the upper-outer posterior right breast measuring approximately 7-8 mm. There is an asymmetry in the retroareolar right breast which resolves with spot compression tomosynthesis imaging, consistent with overlapping fibroglandular tissue. Mammographic images were processed with CAD. Ultrasound targeted to the right breast at 10 o'clock, 2 cm from the nipple demonstrates a hypoechoic oval mass with indistinct margins measuring 7 x 4 x 6 mm. Ultrasound of the right axilla demonstrates multiple normal-appearing lymph nodes. IMPRESSION: 1. There is a 7 mm mass in the right breast at 10 o'clock, favored to represent a complicated cyst. 2.  No evidence of right axillary lymphadenopathy. RECOMMENDATION: Ultrasound-guided aspiration is recommended. The patient is aware that if the lesion does not collapse with aspiration, than the procedure would convert to an ultrasound-guided biopsy. I have discussed the findings and recommendations with the patient. If applicable, a reminder letter will be sent to the patient regarding the next appointment. BI-RADS CATEGORY  4: Suspicious. Electronically Signed   By: Ammie Ferrier M.D.   On: 06/23/2020 12:09   MM DIAG BREAST TOMO UNI RIGHT  Result Date: 06/23/2020 CLINICAL DATA:  Screening recall for a  possible right breast mass. EXAM: DIGITAL DIAGNOSTIC UNILATERAL RIGHT MAMMOGRAM WITH TOMO AND CAD; ULTRASOUND RIGHT BREAST LIMITED COMPARISON:  Previous exam(s). ACR Breast Density Category c: The breast tissue is heterogeneously dense, which may obscure small masses. FINDINGS: Spot compression tomosynthesis images of the right breast demonstrates a persistent ill-defined oval mass in the upper-outer posterior right breast measuring approximately 7-8 mm. There is an asymmetry in the retroareolar right breast which resolves with spot compression tomosynthesis imaging, consistent with overlapping  fibroglandular tissue. Mammographic images were processed with CAD. Ultrasound targeted to the right breast at 10 o'clock, 2 cm from the nipple demonstrates a hypoechoic oval mass with indistinct margins measuring 7 x 4 x 6 mm. Ultrasound of the right axilla demonstrates multiple normal-appearing lymph nodes. IMPRESSION: 1. There is a 7 mm mass in the right breast at 10 o'clock, favored to represent a complicated cyst. 2.  No evidence of right axillary lymphadenopathy. RECOMMENDATION: Ultrasound-guided aspiration is recommended. The patient is aware that if the lesion does not collapse with aspiration, than the procedure would convert to an ultrasound-guided biopsy. I have discussed the findings and recommendations with the patient. If applicable, a reminder letter will be sent to the patient regarding the next appointment. BI-RADS CATEGORY  4: Suspicious. Electronically Signed   By: Ammie Ferrier M.D.   On: 06/23/2020 12:09   MM CLIP PLACEMENT RIGHT  Result Date: 07/06/2020 CLINICAL DATA:  44 year old female status post ultrasound-guided biopsy of the right breast. EXAM: DIAGNOSTIC RIGHT MAMMOGRAM POST ULTRASOUND BIOPSY COMPARISON:  Previous exam(s). FINDINGS: Mammographic images were obtained following ultrasound guided biopsy of the right breast. The biopsy marking clip is in expected position at the site of  biopsy. IMPRESSION: Appropriate positioning of the Q shaped biopsy marking clip at the site of biopsy in the far posterior, upper-outer right breast. Final Assessment: Post Procedure Mammograms for Marker Placement Electronically Signed   By: Kristopher Oppenheim M.D.   On: 07/06/2020 13:43   [place] RT BREAST BX W LOC DEV 1ST LESION IMG BX SPEC US GUIDE  Addendum Date: 07/09/2020   ADDENDUM REPORT: 07/09/2020 12:15 ADDENDUM: PATHOLOGY revealed: A. BREAST MASS, RIGHT 10:00 2 CM FN; ULTRASOUND-GUIDED BIOPSY: - INVASIVE MAMMARY CARCINOMA, NO SPECIAL TYPE. Size of invasive carcinoma: 4 mm in this sample. Grade 3. Ductal carcinoma in situ: Present. Lymphovascular invasion: Not identified. Comment: The invasive carcinoma is associated with / arising from an underlying papillary lesion. The definitive grade will be assigned on the excisional specimen. ER/PR/HER2: Immunohistochemistry will be performed on block A1, with reflex to [place] for HER2 2+. The results will be reported in an addendum. Calponin and p63 stained slides were reviewed and confirm the above diagnosis. Pathology results are CONCORDANT with imaging findings, per Dr. Kristopher Oppenheim. Pathology results and recommendations below were discussed with patient by telephone on 10/142021. Patient reported biopsy site within normal limits with slight tenderness at the site. Post biopsy care instructions were reviewed, questions were answered and my direct phone number was provided to patient. Patient was instructed to call [facility] if any concerns or questions arise related to the biopsy. Recommend: 1. Surgical consultation: Request for surgical consultation relayed to Al Pimple RN and Tanya Nones RN at [facility] by Electa Sniff RN on 07/08/2020. 2.  Bilateral breast MRI given the patient's age and breast density. Pathology results reported by Electa Sniff RN on 07/09/2020. Electronically Signed   By: Kristopher Oppenheim M.D.   On: 07/09/2020  12:15   Result Date: 07/09/2020 CLINICAL DATA:  44 year old female with an indeterminate right breast mass. EXAM: ULTRASOUND GUIDED RIGHT BREAST CORE NEEDLE BIOPSY COMPARISON:  Previous exam(s). PROCEDURE: I met with the patient and we discussed the procedure of ultrasound-guided biopsy, including benefits and alternatives. We discussed the high likelihood of a successful procedure. We discussed the risks of the procedure, including infection, bleeding, tissue injury, clip migration, and inadequate sampling. Informed written consent was given. The usual time-out protocol was performed immediately prior to  the procedure. Lesion quadrant: Upper outer quadrant Using sterile technique and 1% Lidocaine as local anesthetic, under direct ultrasound visualization, attempted aspiration was performed of a hypoechoic mass at the 10 o'clock position 2 cm from the nipple on the right. The mass did not aspirate. Therefore, a 14 gauge spring-loaded device was used to perform biopsy using a inferior approach. At the conclusion of the procedure Q shaped tissue marker clip was deployed into the biopsy cavity. Follow up 2 view mammogram was performed and dictated separately. IMPRESSION: Ultrasound guided biopsy of the right breast. No apparent complications. Electronically Signed: By: Kristopher Oppenheim M.D. On: 07/06/2020 13:42    ASSESSMENT & PLAN:   Carcinoma of upper-outer quadrant of right breast in female, estrogen receptor negative ([place]) #Clinical stage I ER/PR negative HER-2 negative/triple negative breast cancer; T1 [17m];N0- no lymph nodes noted on imaging. Grade 3. awaiting MRI on 10/20 as ordered by surgery.  # I had a long discussion with the patient in general regarding the treatment options of breast cancer including-surgery; adjuvant radiation; role of adjuvant systemic therapy including-chemotherapy.  Discussed the important the role of sentinel lymph node biopsy-with lumpectomy or mastectomy. Also discussed  with patient the role of genetic testing in helping her decide between lumpectomy versus mastectomy. If patient has deleterious mutations-mastectomy with immediate reconstruction would be reasonable choice. Patient is awaiting meeting with plastic surgery.  Discussed with the patient that in general lumpectomy is followed by whole breast radiation.However if patient chooses mastectomy-given early stage cancer-patient will likely not need radiation.   #Discussed the role of in general adjuvant chemotherapy in preventing distant metastatic disease. However decisions for adjuvant chemotherapy based upon final surgery/pathology. No role for endocrine therapy as patient is ER/PR negative. Patient has no children; undecided on family.   #Genetic predisposition: No close maternal family with malignancies. Father side of family unknown. Recommend genetic testing ASAP. Reached out to Breanna/genetic counselor for urgent evaluation. Patient will have blood drawn today for genetic testing.   #Small fiber neuropathy- [Dr.Shah]-currently on Cymbalta/Lyrica.  Thank you Dr. CPeyton Najjarfor allowing me to participate in the care of your pleasant patient. Please do not hesitate to contact me with questions or concerns in the interim. Discussed with Dr. CPeyton Najjar Discussed with SVita Barley breast nurse navigator.  # DISPOSITION:  # genetics blood draw today # Genetic referral- urgent- Breast cancer # follow up TBD- Dr.B  All questions were answered. The patient/family knows to call the clinic with any problems, questions or concerns.    GCammie Sickle MD 07/15/2020 8:22 AM

## 2020-07-14 NOTE — Progress Notes (Signed)
Met patient and her husband today during her initial medical oncology consult with Dr. Rogue Bussing.  Patient with triple negative breast cancer.  Dr. Rogue Bussing discussed genetic testing with patient.  Escorted patient to the lab for genetic testing.  Notified Faith Rogue, the genetic counselor, and order placed for referral.  Patient is scheduled for MRI tomorrow and plastic surgery consult on Friday.  Sherry at Dr. Deniece Ree office [place] patient breast cancer educational literature, "My Breast Cancer Treatment Handbook" by Josephine Igo, RN.  Patient is to call with any questions or needs.

## 2020-07-15 ENCOUNTER — Telehealth: Payer: Self-pay | Admitting: Internal Medicine

## 2020-07-15 ENCOUNTER — Ambulatory Visit
Admission: RE | Admit: 2020-07-15 | Discharge: 2020-07-15 | Disposition: A | Discharge status: Home / Self Care | Payer: BC Managed Care – PPO | Source: Ambulatory Visit | Attending: General Surgery | Admitting: General Surgery

## 2020-07-15 ENCOUNTER — Other Ambulatory Visit: Payer: Self-pay

## 2020-07-15 DIAGNOSIS — C50411 Malignant neoplasm of upper-outer quadrant of right female breast: Secondary | ICD-10-CM | POA: Diagnosis present

## 2020-07-15 DIAGNOSIS — Z171 Estrogen receptor negative status [ER-]: Secondary | ICD-10-CM | POA: Diagnosis present

## 2020-07-15 MED ORDER — GADOBUTROL 1 MMOL/ML IV SOLN
4.0000 mL | Freq: Once | INTRAVENOUS | Status: AC | PRN
  Administered 2020-07-15: 4 mL via INTRAVENOUS

## 2020-07-15 NOTE — Telephone Encounter (Signed)
FYI-  ----------------------------  Hi Beth Wells: I tried to call you earlier to discuss the results of the MRI.   MRI shows 6 mm area suspicious for malignancy [of which we already are aware].  However also shows additional 1.8 cm area-in the right breast-which at this time is unclear whether this is cancer or any NON-cancer process [benign]. We will review your imaging at the tumor conference on 10/25-Monday. And will reach out to you again. Left breast is normal.    I have discussed this with Dr.Cintron, your surgeon. He agrees with above plan.   Please call us back if any questions or concerns in the interim.  Thanks Dr.B -----------------------------

## 2020-07-16 ENCOUNTER — Encounter: Payer: Self-pay | Admitting: Plastic Surgery

## 2020-07-16 ENCOUNTER — Ambulatory Visit: Payer: BC Managed Care – PPO | Admitting: Plastic Surgery

## 2020-07-16 VITALS — BP 134/85 | HR 72 | Temp 98.5°F | Ht 62.0 in | Wt 103.6 lb

## 2020-07-16 DIAGNOSIS — Z171 Estrogen receptor negative status [ER-]: Secondary | ICD-10-CM | POA: Diagnosis not present

## 2020-07-16 DIAGNOSIS — C50411 Malignant neoplasm of upper-outer quadrant of right female breast: Secondary | ICD-10-CM

## 2020-07-16 NOTE — Progress Notes (Signed)
Patient ID: Beth Wells, female    DOB: 17-Sep-1976, 44 y.o.   MRN: [id number]   Chief Complaint  Patient presents with   Breast Cancer    The patient is a 44 year old female here with her husband for a consultation for breast reconstruction.  The patient went for a screening mammogram and was found to have a small mass like lesion of the right breast in the upper outer quadrant.  A diagnostic mammogram and ultrasound was with a biopsy.  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  The patient is 5 feet 2 inches tall and weighs 103 pounds. Her preoperative bra size is a 32 B.  She would like to be around the same size.  She had a kidney stone removed in 2015 and a D&C and a bunionectomy in 2019.  She had an ovarian cyst removed in 2020.  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  Her preference would be to keep as much sensation intact as possible and keep her nipple areolas if possible.   Review of Systems  Constitutional: Negative.  Negative for activity change and appetite change.  HENT: Negative.   Eyes: Negative.   Respiratory: Negative.  Negative for chest tightness and shortness of breath.   Cardiovascular: Negative.  Negative for leg swelling.  Gastrointestinal: Negative.   Endocrine: Negative.   Genitourinary: Negative.   Musculoskeletal: Negative.   Hematological: Negative.   Psychiatric/Behavioral: Negative.     Past Medical History:  Diagnosis Date   Carcinoma of upper-outer quadrant of right breast in female, estrogen receptor negative ([place]) 07/13/2020   H/O foot surgery 08/2018   Hypertension     Past Surgical History:  Procedure Laterality Date   BREAST BIOPSY Right 07/06/2020   u/s bx Q clip path pending   BUNIONECTOMY     CERVICAL CONE BIOPSY     DILATION AND EVACUATION N/A 03/12/2018   Procedure: DILATATION AND EVACUATION;  Surgeon: [name], MD;  Location: [facility] ORS;   Service: Gynecology;  Laterality: N/A;   KIDNEY STONE SURGERY     LAPAROSCOPIC OVARIAN CYSTECTOMY Right 06/07/2019   Procedure: LAPAROSCOPIC OVARIAN CYSTECTOMY;  Surgeon: [name], MD;  Location: [facility] OR[facility];  Service: Gynecology;  Laterality: Right;   LAPAROSCOPY N/A 06/07/2019   Procedure: LAPAROSCOPY Luvenia Redden BIOPSIES;  Surgeon: [name], MD;  Location: [facility] OR[facility];  Service: Gynecology;  Laterality: N/A;      Current Outpatient Medications:    [START ON 07/22/2020] amphetamine-dextroamphetamine (ADDERALL XR) 30 MG 24 hr capsule, Take 1 capsule by mouth daily., Disp: , Rfl:    Ascorbic Acid (VITAMIN C) 1000 MG tablet, Take 1,000 mg by mouth daily. Chewable, Disp: , Rfl:    DULoxetine (CYMBALTA) 20 MG capsule, Take 2 capsules (40 mg total) by mouth at bedtime., Disp: 60 capsule, Rfl: 2   lisinopril (ZESTRIL) 2.5 MG tablet, Take 5 mg by mouth every evening. , Disp: , Rfl:    pregabalin (LYRICA) 100 MG capsule, Take 1 capsule (100 mg total) by mouth 2 (two) times daily., Disp: 180 capsule, Rfl: 0   Probiotic Product (PROBIOTIC ADVANCED PO), Take 1 capsule by mouth daily., Disp: , Rfl:    Objective:   Vitals:   07/16/20 1052  BP: 134/85  Pulse: 72  Temp: 98.5 F (36.9 C)  SpO2: 99%    Physical Exam Vitals and nursing note reviewed.  Constitutional:      Appearance: Normal appearance.  HENT:  Head: Normocephalic and atraumatic.  Cardiovascular:     Rate and Rhythm: Normal rate.     Pulses: Normal pulses.  Pulmonary:     Effort: Pulmonary effort is normal. No respiratory distress.  Abdominal:     General: Abdomen is flat. There is no distension.     Tenderness: There is no abdominal tenderness.  Skin:    General: Skin is warm.  Neurological:     General: No focal deficit present.     Mental Status: She is alert and oriented to person, place, and time.  Psychiatric:        Mood and Affect: Mood normal.        Thought Content: Thought  content normal.     Assessment & Plan:  Carcinoma of upper-outer quadrant of right breast in female, estrogen receptor negative ([place])  We had a detailed conversation about the patients options for breast reconstruction. Several reconstruction options were explained to the patient.  It is important to remember that breast reconstruction is an optional procedure. Reconstruction often requires several stages of surgery and this means more than one operation.  The surgeries are often done several months apart.  The entire process from start to finish can take a year or more. The major goal of breast reconstruction is to look normal in clothing. There will always be scars and a difference noticeable without clothes.  This is true for asymmetries where both breasts will not be identical.  Surgery may be needed or desired to the non-cancerous breast in order to achieve better symmetry and satisfactory results.  Regardless of the reconstructive method, there is always risks and the possibility that the procedure will fail or have complications.  This couls required additional surgeries.    We discussed the available methods of breast reconstruction and included:  1. Tissue expander with Acellular dermal matrix followed by implant based reconstruction. This can be done as one surgery or multiple surgeries.  2. Autologous reconstruction can include using a muscle or tissue from another area of the body for the reconstruction.  3. Combined procedures like the latissismus dorsi flaps that often uses the muscle with an expander or implant.  For each of the method discussed the risks, benefits, scars and recovery time were discussed in detail. Specific risks included bleeding, infection, hematoma, seroma, scarring, pain, wound healing complications, flap loss, fat necrosis, capsular contracture, need for implant removal, donor site complications, bulge, hernia, umbilical necrosis, need for urgent reoperation, and  need for dressing changes.   After the options were discussed we focused on the patient's desires and the procedure that was best for her based on all the information.  A total of 50 minutes of face-to-face time was spent in this encounter, of which >70% was spent in counseling.  The remaining time I spent reviewing her chart, dictating and confirming accuracy with the patient.  I spoke to Dr. Windell Moment about the above information.  She is likely not a very good candidate for breast conserving treatment.  If her genetics is negative then her best option is for a right-sided mastectomy.  If her genetics is positive then bilateral mastectomy would be the next option.  The patient and I will plan to talk next week and review her wishes.  For now I will put in for reconstruction of the right breast at the time of the mastectomy in order to save the time in the OR for the patient.  Pictures were obtained of the patient and placed  in the chart with the patient's or guardian's permission.     Pictures were obtained of the patient and placed in the chart with the patient's or guardian's permission.  [place], DO

## 2020-07-19 ENCOUNTER — Other Ambulatory Visit: Payer: Self-pay | Admitting: General Surgery

## 2020-07-19 ENCOUNTER — Inpatient Hospital Stay: Payer: BC Managed Care – PPO | Admitting: Licensed Clinical Social Worker

## 2020-07-19 ENCOUNTER — Other Ambulatory Visit: Payer: Self-pay

## 2020-07-19 ENCOUNTER — Encounter: Payer: Self-pay | Admitting: Licensed Clinical Social Worker

## 2020-07-19 DIAGNOSIS — C50411 Malignant neoplasm of upper-outer quadrant of right female breast: Secondary | ICD-10-CM

## 2020-07-19 DIAGNOSIS — Z171 Estrogen receptor negative status [ER-]: Secondary | ICD-10-CM

## 2020-07-19 NOTE — Progress Notes (Signed)
REFERRING PROVIDER: Cammie Sickle, MD [place],  [place] [address]  PRIMARY PROVIDER:  Shari Prows, [facility]  PRIMARY REASON FOR VISIT:  1. Carcinoma of upper-outer quadrant of right breast in female, estrogen receptor negative ([place])      HISTORY OF PRESENT ILLNESS:   Beth Wells, a 44 y.o. female, was seen for a [place] cancer genetics consultation at the request of Dr. Rogue Bussing due to a personal history of cancer.  Beth Wells presents to clinic today to discuss the possibility of a hereditary predisposition to cancer, genetic testing, and to further clarify her future cancer risks, as well as potential cancer risks for family members.    In 2021, at the age of 3, Beth Wells was diagnosed with invasive mammary carcinoma of the right breast, triple negative. Treatment plan includes surgery, she plans to use genetic testing to help inform her decision.   CANCER HISTORY:  Oncology History Overview Note  # OCT 2021-RIGHT BREAST- TRIPLE NEGATIVE [place]; [US/mammo-4m]; Dr.Cintron; OCT 2021-USThere is a 7 mm mass in the right breast at 10 o'clock, favored to represent a complicated cyst;  No evidence of right axillary lymphadenopathy].   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  Ovaries intact: yes.  Hysterectomy: no.  Menopausal status: premenopausal.  HRT use: 0 years. Colonoscopy: no; not examined. Mammogram within the last year: yes. Number of breast biopsies: 1. Any excessive radiation exposure in the  past: no  Past Medical History:  Diagnosis Date  . Carcinoma of upper-outer quadrant of right breast in [place], estrogen receptor negative ([place] 07/13/2020  . H/O foot surgery 08/2018  . Hypertension     Past Surgical History:  Procedure Laterality Date  . BREAST BIOPSY Right 07/06/2020   u/s bx Q clip path pending  . BUNIONECTOMY    . CERVICAL CONE BIOPSY    . DILATION AND EVACUATION N/A 03/12/2018   Procedure: DILATATION AND EVACUATION;  Surgeon: BBenjaman Kindler MD;  Location: [facility] OR[facility];  Service: Gynecology;  Laterality: N/A;  . KIDNEY STONE SURGERY    . LAPAROSCOPIC OVARIAN CYSTECTOMY Right 06/07/2019   Procedure: LAPAROSCOPIC OVARIAN CYSTECTOMY;  Surgeon: BBenjaman Kindler MD;  Location: [facility];  Service: Gynecology;  Laterality: Right;  . LAPAROSCOPY N/A 06/07/2019   Procedure: LAPAROSCOPY DLuvenia ReddenBIOPSIES;  Surgeon: BBenjaman Kindler MD;  Location: [facility] OR[facility];  Service: Gynecology;  Laterality: N/A;    Social History   Socioeconomic History  . Marital status: Married    Spouse name: Not on file  . Number of children: Not on file  . Years of education: Not on file  . Highest education level: Not on file  Occupational History  . Not on file  Tobacco Use  . Smoking status: Never Smoker  . Smokeless tobacco: Never Used  Vaping Use  . Vaping Use: Never used  Substance and Sexual Activity  . Alcohol use: Not Currently  . Drug use: Not Currently  . Sexual activity: Yes  Other  Topics Concern  . 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 Lives in [place].    Social Determinants of Health   Financial Resource Strain:   . Difficulty of Paying Living Expenses: Not on file  Food Insecurity:   . Worried About Charity fundraiser in the Last Year: Not on file  . Ran Out of Food in the Last Year: Not on file  Transportation Needs:   . Lack of Transportation (Medical): Not on  file  . Lack of Transportation (Non-Medical): Not on file  Physical Activity:   . Days of Exercise per Week: Not on file  . Minutes of Exercise per Session: Not on file  Stress:   . Feeling of Stress : Not on file  Social Connections:   . Frequency of Communication with Friends and Family: Not on file  . Frequency of Social Gatherings with Friends and Family: Not on file  . Attends Religious Services: Not on file  . Active Member of Clubs or Organizations: Not on file  . Attends Archivist Meetings: Not on file  . Marital Status: Not on file     FAMILY HISTORY:  We obtained a detailed, 4-generation family history.  Significant diagnoses are listed below: Family History  Problem Relation Age of Onset  . Cancer Other        great aunt- GI cancer  . Breast cancer Neg Hx    Beth Wells has one sister. She has not had cancer.  Beth Wells mother is living at 37 and has not had cancer. Patient has 1 maternal aunt, no cancers. Patient does not have maternal first cousins. Maternal grandmother died at 1, grandfather died in his 23s. Grandmother had 3 sisters, one had lymphoma and one had an abdominal cancer, unsure exact type.  Beth Wells father is living at 64, no history of cancer. He is adopted and patient does not have any further information about this side of the family.  Beth Wells is unaware of previous family history of genetic testing for hereditary cancer risks. Patient's maternal ancestors are of [place] descent, and paternal ancestors are of unknown descent. There is no reported Ashkenazi Jewish ancestry. There is no known consanguinity.  GENETIC COUNSELING ASSESSMENT: Beth Wells is a 44 y.o. female with a personal history which is somewhat suggestive of a hereditary cancer syndrome and predisposition to cancer. We, therefore, discussed and recommended the following at today's visit.   DISCUSSION: We discussed that approximately 5-10% of breast cancer is hereditary   Most cases of hereditary breast cancer are associated with BRCA1/BRCA2 genes, although there are other genes associated with hereditary breast cancer as well including CHEK2, ATM, PALB2 .  We discussed that testing is beneficial for several reasons including surgical decision-making for breast cancer, knowing about other cancer risks, identifying potential screening and risk-reduction options that may be appropriate, and to understand if other family members could be at risk for cancer and allow them to undergo genetic testing.   We reviewed the characteristics, features and inheritance patterns of hereditary cancer syndromes. We also discussed genetic testing, including the appropriate family members to test, the process of testing, insurance coverage and turn-around-time for results. We discussed the implications of a negative, positive and/or variant of uncertain significant result. In order to get genetic test results in a timely manner so that Beth Wells can use these genetic test results for surgical decisions, we recommended  Beth Wells pursue genetic testing for the [facility] Breast Cancer STAT Panel. Once complete, we recommend Beth Wells pursue reflex genetic testing to the Multi-Cancer gene panel.   The STAT Breast cancer panel offered by [facility] includes sequencing and rearrangement analysis for the following 9 genes:  ATM, BRCA1, BRCA2, CDH1, CHEK2, PALB2, PTEN, STK11 and TP53.    The Multi-Cancer Panel offered by [facility] includes sequencing and/or deletion duplication testing of the following 85 genes: AIP, ALK, APC, ATM, AXIN2,BAP1,  BARD1, BLM, BMPR1A, BRCA1, BRCA2, BRIP1, CASR, CDC73, CDH1, CDK4, CDKN1B, CDKN1C, CDKN2A (p14ARF), CDKN2A (p16INK4a), CEBPA, CHEK2, CTNNA1, DICER1, DIS3L2, EGFR (c.2369C>T, p.Thr790Met variant only), EPCAM (Deletion/duplication testing only), FH, FLCN, GATA2, GPC3, GREM1 (Promoter region deletion/duplication testing only), HOXB13 (c.251G>A, p.Gly84Glu), HRAS, KIT, MAX,  MEN1, MET, MITF (c.952G>A, p.Glu318Lys variant only), MLH1, MSH2, MSH3, MSH6, MUTYH, NBN, NF1, NF2, NTHL1, PALB2, PDGFRA, PHOX2B, PMS2, POLD1, POLE, POT1, PRKAR1A, PTCH1, PTEN, RAD50, RAD51C, RAD51D, RB1, RECQL4, RET, RNF43, RUNX1, SDHAF2, SDHA (sequence changes only), SDHB, SDHC, SDHD, SMAD4, SMARCA4, SMARCB1, SMARCE1, STK11, SUFU, TERC, TERT, TMEM127, TP53, TSC1, TSC2, VHL, WRN and WT1.   Based on Beth Wells's personal history of cancer, she meets medical criteria for genetic testing. Despite that she meets criteria, she may still have an out of pocket cost.   PLAN: After considering the risks, benefits, and limitations, Beth Wells provided informed consent to pursue genetic testing and the blood sample was sent to [facility] for analysis of the Breast Cancer STAT Panel + Multi-Cancer Panel last week. Results should be available within approximately 1 day-1 weeks' time, at which point they will be disclosed by telephone to Beth Wells, as will any additional recommendations warranted by these results. Beth Wells will receive a summary of her genetic counseling visit and a copy of her results once available. This information will also be available in Epic.   Beth Wells questions were answered to her satisfaction today. Our contact information was provided should additional questions or concerns arise. Thank you for the referral and allowing [place] to share in the care of your patient.   Faith Rogue, MS, [facility] Genetic Counselor [email or web address] Phone: [phone]  The patient was seen for a total of 35 minutes in face-to-face genetic counseling.  Dr. Grayland Ormond was available for discussion regarding this case.   _______________________________________________________________________ For Office Staff:  Number of people involved in session: 1 Was an Intern/ student involved with case: no

## 2020-07-20 ENCOUNTER — Encounter: Payer: Self-pay | Admitting: *Deleted

## 2020-07-20 NOTE — Progress Notes (Signed)
Patient called with lots of questions regarding her upcoming treatments for breast cancer.  She would like to get her Covid 19 booster vaccine as soon as possible.  She also wants to get standard chemo, despite the fact she has the small fiber neuropathy.  She would like to have a dietician consult, and a physical therapy consultation post reconstructive surgery.  I informed patient I would talk with Dr. Rogue Bussing tomorrow and ask about timing considerations for her booster, the chemo plan and her neuropathy.  I also mentioned I would discuss acupunture pre or during chemo with him and Dr. Raul Del for her recommendations.  She is very interested in trying acupuncture.  Genetic results are still pending.  At this time, patient is planning on skin and nipple sparing mastectomy with plans for reconstruction.  She will plan second mastectomy on the opposite breast after chemotherapy is completed.  Will follow up with her again after my discussion with Dr. Rogue Bussing.

## 2020-07-21 ENCOUNTER — Encounter: Payer: Self-pay | Admitting: *Deleted

## 2020-07-21 NOTE — Progress Notes (Signed)
Called and informed patient of my discussion with Dr. Rogue Bussing with her questions.  Informed her he said to go ahead and get her Covid booster now, that he will discuss chemo after final pathology and he is in agreement with making an acupuncture referral for neuropathy, and a dietary and physical therapy consult when she follows up with him post surgery.

## 2020-07-22 ENCOUNTER — Telehealth: Payer: Self-pay | Admitting: Licensed Clinical Social Worker

## 2020-07-22 ENCOUNTER — Telehealth: Payer: Self-pay | Admitting: Internal Medicine

## 2020-07-22 NOTE — Telephone Encounter (Signed)
On 10/27-left a voicemail for the patient regarding her concerns for need for adjuvant chemotherapy after surgery.  Also discussed with Sheena regarding COVID booster/evaluation with Gwenette Greet, OT for surgery.  I spoke to Dr. Brigitte Pulse patient neurologist regarding her diagnosis of small fiber neuropathy.  Unfortunately this could be exacerbated taxane based chemotherapy.  We will still recommend proceeding with upfront surgery; and decide on adjuvant chemotherapy plan based on final pathology.  FYI-Dr.Cintron.

## 2020-07-22 NOTE — Telephone Encounter (Signed)
Revealed negative genetic testing on the [facility] Breast Cancer STAT Panel. The remainder of testing is still pending, we will call Beth Wells back to review this once it is ready.

## 2020-07-23 ENCOUNTER — Other Ambulatory Visit: Payer: Self-pay

## 2020-07-23 ENCOUNTER — Telehealth (INDEPENDENT_AMBULATORY_CARE_PROVIDER_SITE_OTHER): Payer: BC Managed Care – PPO | Admitting: Plastic Surgery

## 2020-07-23 ENCOUNTER — Encounter: Payer: Self-pay | Admitting: Plastic Surgery

## 2020-07-23 DIAGNOSIS — Z171 Estrogen receptor negative status [ER-]: Secondary | ICD-10-CM | POA: Diagnosis not present

## 2020-07-23 DIAGNOSIS — C50411 Malignant neoplasm of upper-outer quadrant of right female breast: Secondary | ICD-10-CM | POA: Diagnosis not present

## 2020-07-23 NOTE — Progress Notes (Signed)
I connected with  Ziyah Cordoba on 07/23/20 by a phone enabled telemedicine application and verified that I am speaking with the correct person using two identifiers.   I discussed the limitations of evaluation and management by telemedicine. We spent 10 minutes discussing options and the patients decision.    The patient let me know that her genetics was negative.  She is very happy about that.  She is still concerned about her nipple areola sensation.  I explained that any surgery could affect sensation.  There is no way of avoiding a change.  And there is no guarantee that it will come back.  She has decided on a right unilateral mastectomy.  She would like to have a immediate reconstruction with expander and Flex HD placement.  She already has a date set up.  She is going to continue to think things through and will call if she has any change of mind.  Her husband joined [place] on the phone.  I will call and talk with Dr. Windell Moment and let him know that she has made her decision.    Plan will be for immediate right breast reconstruction with expander and Flex HD placement.

## 2020-07-27 ENCOUNTER — Ambulatory Visit: Payer: Self-pay | Admitting: General Surgery

## 2020-07-27 NOTE — H&P (Signed)
PATIENT PROFILE: Beth Wells is a 44 y.o. female who presents to the Clinic for consultation at the request of [place], [place] for evaluation of right breast cancer.  PCP:  Nelwyn Salisbury, [place]  HISTORY OF PRESENT ILLNESS: [name] reports had her usual screening mammogram and she was found small masslike structure on the right breast 10 o'clock position.  This led to diagnostic mammogram and ultrasound.  The ultrasound looks like a complex cyst.  Upon trial of aspiration, since the cyst did not aspirated, core biopsy was taken. Core biopsy shows a 4 mm area of invasive mammary carcinoma.  Patient is ER/PR and HER-2/neu negative.  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 Had [facility] biopsy. Normal PAP since x 3      Chronic pain syndrome 07/24/2019   Paresthesias 07/24/2019   Small fiber neuropathy 07/24/2019   Torsion of right ovary and ovarian pedicle 06/06/2019   Idiopathic progressive neuropathy 01/03/2019   Bilateral foot pain 12/11/2018   Sprain of metatarsophalangeal joint of right great toe 08/29/2018   Hallux valgus, right 05/22/2018   Hallux valgus, left 05/22/2018   History of nephrolithiasis 05/16/2018   Overview    2015, had lithotripsy and surgery in [place] hypertension 05/06/2015   Attention deficit hyperactivity disorder (ADHD), predominantly inattentive type 05/06/2015   Overview    Stimulant contract signed on 07-10-19, UDS done October 2020         GENERAL  REVIEW OF SYSTEMS:   General ROS: negative for - chills, fatigue, fever, weight gain or weight loss Allergy and Immunology ROS: negative for - hives  Hematological and Lymphatic ROS: negative for - bleeding problems or bruising, negative for palpable nodes Endocrine ROS: negative for - heat or cold intolerance, hair changes Respiratory ROS: negative for - cough, shortness of breath or wheezing Cardiovascular ROS: no chest pain or palpitations GI ROS: negative for nausea, vomiting, abdominal pain, diarrhea, constipation Musculoskeletal ROS: negative for - joint swelling or muscle pain Neurological ROS: negative for - confusion, syncope Dermatological ROS: negative for pruritus and rash Psychiatric: negative for anxiety, depression, difficulty sleeping and memory loss  MEDICATIONS: Current Medications        Current Outpatient Medications  Medication Sig Dispense Refill  . alpha lipoic acid-herbal 305 150 mg Cap Take by mouth once daily    . ascorbic acid, vitamin C, (VITAMIN C) 100 MG tablet Take 100 mg by mouth once daily    . [START ON 09/20/2020] dextroamphetamine-amphetamine (ADDERALL XR) 30 MG XR capsule Take 1 capsule (30 mg total) by mouth once daily for 30 days 30 capsule 0  . [START ON 08/21/2020] dextroamphetamine-amphetamine (ADDERALL XR) 30 MG XR capsule Take 1 capsule (30 mg total) by mouth once daily for 30 days 30 capsule 0  . [START ON 07/22/2020] dextroamphetamine-amphetamine (ADDERALL XR) 30 MG XR capsule Take 1 capsule (30 mg total) by mouth once daily for 30 days 30  capsule 0  . DULoxetine (CYMBALTA) 20 MG DR capsule TAKE 2 CAPSULES (40 MG TOTAL) BY MOUTH ONCE DAILY 180 capsule 3  . Lactobac no.41/Bifidobact no.7 (PROBIOTIC-10 ORAL) Take by mouth    . lisinopriL (ZESTRIL) 5 MG tablet Take 1 tablet (5 mg total) by mouth once daily 30 tablet 11  . pregabalin (LYRICA) 50 MG capsule Take 50 mg by mouth 2 (two) times daily        No current facility-administered  medications for this visit.      ALLERGIES: Patient has no known allergies.  PAST MEDICAL HISTORY:     Past Medical History:  Diagnosis Date  . Depression   . Hypertension   . Ovarian cyst   . PONV (postoperative nausea and vomiting)    got sick on the way home after a procedure       . Renal calculus or stone    2015    PAST SURGICAL HISTORY:      Past Surgical History:  Procedure Laterality Date  . BUNION CORRECTION Right 08/29/2018   Procedure: CORRECTION, HALLUX VALGUS (BUNIONECTOMY), WITH SESAMOIDECTOMY, WHEN PERFORMED; WITH DOUBLE OSTEOTOMY, ANY METHOD;  Surgeon: Linna Darner, MD;  Location: [facility];  Service: Orthopedics;  Laterality: Right;  . BUNION CORRECTION Right 08/29/2018   Procedure: CORRECTION, HALLUX VALGUS (BUNIONECTOMY), WITH SESAMOIDECTOMY, WHEN PERFORMED; WITH PROXIMAL PHALANX OSTEOTOMY, ANY METHOD;  Surgeon: Linna Darner, MD;  Location: [facility];  Service: Orthopedics;  Laterality: Right;  . CERVICAL CONE BIOPSY  07/26/2014   Results were negative, paps normal since  . DILATION AND CURETTAGE OF UTERUS    . INJECTION ANESTHETIC AGENT OTHER NERVE OR BRANCH Right 08/29/2018   Procedure: INJECTION, ANESTHETIC AGENT; OTHER PERIPHERAL NERVE OR BRANCH;  Surgeon: Linna Darner, MD;  Location: [facility];  Service: Orthopedics;  Laterality: Right;  . INTRAOPERATIVE FLUOROSCOPY Right 08/29/2018   Procedure: FLUOROSCOPY (SEPARATE PROCEDURE), UP TO 1 HOUR PHYSICIAN OR OTHER QUALIFIED HEALTH CARE PROFESSIONAL TIME;  Surgeon: Linna Darner, MD;  Location: [facility];  Service: Orthopedics;  Laterality: Right;  . LITHOTRIPSY ESWL  aug 2015  . REPAIR FLEXOR TENDON/POSTERIOR TIBIALIS FOOT W/O FREE GRAFT Right 08/29/2018   Procedure: REPAIR, TENDON, FLEXOR, FOOT; PRIMARY OR SECONDARY, WITHOUT FREE GRAFT, EACH TENDON;  Surgeon: Linna Darner, MD;  Location: [facility];  Service: Orthopedics;  Laterality: Right;  .  ureteroscopic stone removal  Dec 0383   complicated by blood clots occluding ureteral stent     FAMILY HISTORY:      Family History  Problem Relation Age of Onset  . Depression Mother   . High blood pressure (Hypertension) Mother   . Hyperlipidemia (Elevated cholesterol) Father   . High blood pressure (Hypertension) Father   . Coronary Artery Disease (Blocked arteries around heart) Maternal Grandmother   . Thyroid disease Maternal Grandmother   . Coronary Artery Disease (Blocked arteries around heart) Maternal Grandfather      SOCIAL HISTORY: Social History          Socioeconomic History  . Marital status: Married    Spouse name: [name]  . Number of children: Not on file  . Years of education: Not on file  . Highest education level: Not on file  Occupational History  . Occupation: Electrical engineer  Tobacco Use  . Smoking status: Never Smoker  . Smokeless tobacco: Never Used  Vaping Use  . Vaping Use: Never used  Substance and Sexual Activity  . Alcohol use: Not Currently  Alcohol/week: 0.0 standard drinks    Comment: seldom   . Drug use: No  . Sexual activity: Yes    Partners: Male    Birth control/protection: None  Other Topics Concern  . 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 Weight: 46.3 kg (102 lb)   GENERAL: Alert, active, oriented x3  HEENT: Pupils equal reactive to light. Extraocular movements are intact. Sclera clear. Palpebral conjunctiva normal red color.Pharynx clear.  NECK: Supple with no palpable mass and no  adenopathy.  LUNGS: Sound clear with no rales rhonchi or wheezes.  HEART: Regular rhythm S1 and S2 without murmur.  BREAST: breasts appear normal, no suspicious masses, no skin or nipple changes or axillary nodes.  ABDOMEN: Soft and depressible, nontender with no palpable mass, no hepatomegaly.  EXTREMITIES: Well-developed well-nourished symmetrical with no dependent edema.  NEUROLOGICAL: Awake alert oriented, facial expression symmetrical, moving all extremities.  REVIEW OF DATA: I have reviewed the following data today:      Office Visit on 07/12/2020  Component Date Value  . Amphetamine/Methamphetam* 07/12/2020 Positive*  . Barbiturates, Urine 07/12/2020 Negative   . Benzodiazepine, Urine 07/12/2020 Negative   . Cocaine Metabolites, Uri* 07/12/2020 Negative   . Methadone, Urine 07/12/2020 Negative   . Opiates, Urine 07/12/2020 Negative   . Oxycodone, Urine 07/12/2020 Negative   . Tetrahydrocannabinol (TH* 07/12/2020 Negative   Office Visit on 04/30/2020  Component Date Value  . Cholesterol, Total 04/30/2020 161   . LDL Calculated 04/30/2020 95   . HDL 04/30/2020 57   . Triglyceride 04/30/2020 47   . Sodium 04/30/2020 141   . Potassium 04/30/2020 4.3   . Chloride 04/30/2020 107   . Carbon Dioxide (CO2) 04/30/2020 23   . Urea Nitrogen (BUN) 04/30/2020 23*  . Creatinine 04/30/2020 0.7   . Glucose 04/30/2020 83   . Calcium 04/30/2020 9.1   . Anion Gap 04/30/2020 11   . BUN/CREA Ratio 04/30/2020 33*  . Glomerular Filtration Ra* 04/30/2020 106   . WBC (White Blood Cell Co* 04/30/2020 4.7   . Hemoglobin 04/30/2020 13.2   . Hematocrit 04/30/2020 39.9   . Platelets 04/30/2020 242   . MCV (Mean Corpuscular Vo* 04/30/2020 95   . MCH (Mean Corpuscular He* 04/30/2020 31.5   . MCHC (Mean Corpuscular H* 04/30/2020 33.1   . RBC (Red Blood Cell Coun* 04/30/2020 4.19   . RDW-CV (Red Cell Distrib* 04/30/2020 11.9   . MPV (Mean Platelet Volum* 04/30/2020 10.5   . Hepatitis  C Virus Antibo* 04/30/2020 NonReactive   . Vitamin B12 04/30/2020 295      ASSESSMENT: Ms. Pagel is a 45 y.o. 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  We had a long discussion about the biopsy results that shows 4 mm of invasive mammary carcinoma with triple negative receptors.  I have started conversation with the medical oncologist and due to the size of the ultrasound and the core biopsy patient may be a candidate of upfront surgery with the decision of having chemotherapy after the surgery.  I  have also discussed with the patient that due to the density of the breast, MRI was recommended.  The MRI will help to make further decisions regarding if patient will need neoadjuvant chemotherapy first and will also help to make decision regarding partial versus total mastectomy.  Patient was oriented again about the pathology results. Surgical alternatives were discussed with patient including partial vs total mastectomy. Surgical technique and post operative care was discussed with patient. Risk of surgery was discussed with patient including but not limited to: wound infection, seroma, hematoma, brachial plexopathy, mondor's disease (thrombosis of small veins of breast), chronic wound pain, breast lymphedema, altered sensation to the nipple and cosmesis among others.   We discussed about the possibility of having reconstruction at the same time of the surgery.  I called the plastic and constructive surgery service and an appointment was made for this Friday at 11 AM.  Malignant neoplasm of upper-outer quadrant of right breast in female, estrogen receptor negative (CMS-HCC) [C50.411, Z17.1]  PLAN: 1. Right total mastectomy with sentinel lymph node biopsy 2. Immediate reconstruction by Plastic and reconstructive surgery  Patient and her husband verbalized understanding, all questions were answered, and were agreeable with the plan outlined above.    I spent a total of 80 minutes in both face-to-face and non-face-to-face activities for this visit on the date of this encounter.  Herbert Pun, MD  Electronically signed by Herbert Pun, MD

## 2020-07-27 NOTE — H&P (View-Only) (Signed)
PATIENT PROFILE: Beth Wells is a 44 y.o. female who presents to the Clinic for consultation at the request of [place] for evaluation of right breast cancer.  PCP:  [place], [place], [place]  HISTORY OF PRESENT ILLNESS: [name] reports had her usual screening mammogram and she was found small masslike structure on the right breast 10 o'clock position.  This led to diagnostic mammogram and ultrasound.  The ultrasound looks like a complex cyst.  Upon trial of aspiration, since the cyst did not aspirated, core biopsy was taken. Core biopsy shows a 4 mm area of invasive mammary carcinoma.  Patient is ER/PR and HER-2/neu negative.  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 Had [facility] biopsy. Normal PAP since x 3      Chronic pain syndrome 07/24/2019   Paresthesias 07/24/2019   Small fiber neuropathy 07/24/2019   Torsion of right ovary and ovarian pedicle 06/06/2019   Idiopathic progressive neuropathy 01/03/2019   Bilateral foot pain 12/11/2018   Sprain of metatarsophalangeal joint of right great toe 08/29/2018   Hallux valgus, right 05/22/2018   Hallux valgus, left 05/22/2018   History of nephrolithiasis 05/16/2018   Overview    2015, had lithotripsy and surgery in [place]       Essential hypertension 05/06/2015   Attention deficit hyperactivity disorder (ADHD), predominantly inattentive type 05/06/2015   Overview    Stimulant contract signed on 07-10-19, UDS done October 2020         GENERAL  REVIEW OF SYSTEMS:   General ROS: negative for - chills, fatigue, fever, weight gain or weight loss Allergy and Immunology ROS: negative for - hives  Hematological and Lymphatic ROS: negative for - bleeding problems or bruising, negative for palpable nodes Endocrine ROS: negative for - heat or cold intolerance, hair changes Respiratory ROS: negative for - cough, shortness of breath or wheezing Cardiovascular ROS: no chest pain or palpitations GI ROS: negative for nausea, vomiting, abdominal pain, diarrhea, constipation Musculoskeletal ROS: negative for - joint swelling or muscle pain Neurological ROS: negative for - confusion, syncope Dermatological ROS: negative for pruritus and rash Psychiatric: negative for anxiety, depression, difficulty sleeping and memory loss  MEDICATIONS: Current Medications        Current Outpatient Medications  Medication Sig Dispense Refill  . alpha lipoic acid-herbal 305 150 mg Cap Take by mouth once daily    . ascorbic acid, vitamin C, (VITAMIN C) 100 MG tablet Take 100 mg by mouth once daily    . [START ON 09/20/2020] dextroamphetamine-amphetamine (ADDERALL XR) 30 MG XR capsule Take 1 capsule (30 mg total) by mouth once daily for 30 days 30 capsule 0  . [START ON 08/21/2020] dextroamphetamine-amphetamine (ADDERALL XR) 30 MG XR capsule Take 1 capsule (30 mg total) by mouth once daily for 30 days 30 capsule 0  . [START ON 07/22/2020] dextroamphetamine-amphetamine (ADDERALL XR) 30 MG XR capsule Take 1 capsule (30 mg total) by mouth once daily for 30 days 30   capsule 0  . DULoxetine (CYMBALTA) 20 MG DR capsule TAKE 2 CAPSULES (40 MG TOTAL) BY MOUTH ONCE DAILY 180 capsule 3  . Lactobac no.41/Bifidobact no.7 (PROBIOTIC-10 ORAL) Take by mouth    . lisinopriL (ZESTRIL) 5 MG tablet Take 1 tablet (5 mg total) by mouth once daily 30 tablet 11  . pregabalin (LYRICA) 50 MG capsule Take 50 mg by mouth 2 (two) times daily        No current facility-administered  medications for this visit.      ALLERGIES: Patient has no known allergies.  PAST MEDICAL HISTORY:     Past Medical History:  Diagnosis Date  . Depression   . Hypertension   . Ovarian cyst   . PONV (postoperative nausea and vomiting)    got sick on the way home after a procedure       . Renal calculus or stone    2015    PAST SURGICAL HISTORY:      Past Surgical History:  Procedure Laterality Date  . BUNION CORRECTION Right 08/29/2018   Procedure: CORRECTION, HALLUX VALGUS (BUNIONECTOMY), WITH SESAMOIDECTOMY, WHEN PERFORMED; WITH DOUBLE OSTEOTOMY, ANY METHOD;  Surgeon: Linna Darner, MD;  Location: [facility];  Service: Orthopedics;  Laterality: Right;  . BUNION CORRECTION Right 08/29/2018   Procedure: CORRECTION, HALLUX VALGUS (BUNIONECTOMY), WITH SESAMOIDECTOMY, WHEN PERFORMED; WITH PROXIMAL PHALANX OSTEOTOMY, ANY METHOD;  Surgeon: Linna Darner, MD;  Location: [facility];  Service: Orthopedics;  Laterality: Right;  . CERVICAL CONE BIOPSY  07/26/2014   Results were negative, paps normal since  . DILATION AND CURETTAGE OF UTERUS    . INJECTION ANESTHETIC AGENT OTHER NERVE OR BRANCH Right 08/29/2018   Procedure: INJECTION, ANESTHETIC AGENT; OTHER PERIPHERAL NERVE OR BRANCH;  Surgeon: Linna Darner, MD;  Location: [facility];  Service: Orthopedics;  Laterality: Right;  . INTRAOPERATIVE FLUOROSCOPY Right 08/29/2018   Procedure: FLUOROSCOPY (SEPARATE PROCEDURE), UP TO 1 HOUR PHYSICIAN OR OTHER QUALIFIED HEALTH CARE PROFESSIONAL TIME;  Surgeon: Linna Darner, MD;  Location: [facility];  Service: Orthopedics;  Laterality: Right;  . LITHOTRIPSY ESWL  aug 2015  . REPAIR FLEXOR TENDON/POSTERIOR TIBIALIS FOOT W/O FREE GRAFT Right 08/29/2018   Procedure: REPAIR, TENDON, FLEXOR, FOOT; PRIMARY OR SECONDARY, WITHOUT FREE GRAFT, EACH TENDON;  Surgeon: Linna Darner, MD;  Location: [facility];  Service: Orthopedics;  Laterality: Right;  .  ureteroscopic stone removal  Dec 0383   complicated by blood clots occluding ureteral stent     FAMILY HISTORY:      Family History  Problem Relation Age of Onset  . Depression Mother   . High blood pressure (Hypertension) Mother   . Hyperlipidemia (Elevated cholesterol) Father   . High blood pressure (Hypertension) Father   . Coronary Artery Disease (Blocked arteries around heart) Maternal Grandmother   . Thyroid disease Maternal Grandmother   . Coronary Artery Disease (Blocked arteries around heart) Maternal Grandfather      SOCIAL HISTORY: Social History          Socioeconomic History  . Marital status: Married    Spouse name: [name]  . Number of children: Not on file  . Years of education: Not on file  . Highest education level: Not on file  Occupational History  . Occupation: Electrical engineer  Tobacco Use  . Smoking status: Never Smoker  . Smokeless tobacco: Never Used  Vaping Use  . Vaping Use: Never used  Substance and Sexual Activity  . Alcohol use: Not Currently  Alcohol/week: 0.0 standard drinks    Comment: seldom   . Drug use: No  . Sexual activity: Yes    Partners: Male    Birth control/protection: None  Other Topics Concern  . 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 Weight: 46.3 kg (102 lb)   GENERAL: Alert, active, oriented x3  HEENT: Pupils equal reactive to light. Extraocular movements are intact. Sclera clear. Palpebral conjunctiva normal red color.Pharynx clear.  NECK: Supple with no palpable mass and no  adenopathy.  LUNGS: Sound clear with no rales rhonchi or wheezes.  HEART: Regular rhythm S1 and S2 without murmur.  BREAST: breasts appear normal, no suspicious masses, no skin or nipple changes or axillary nodes.  ABDOMEN: Soft and depressible, nontender with no palpable mass, no hepatomegaly.  EXTREMITIES: Well-developed well-nourished symmetrical with no dependent edema.  NEUROLOGICAL: Awake alert oriented, facial expression symmetrical, moving all extremities.  REVIEW OF DATA: I have reviewed the following data today:      Office Visit on 07/12/2020  Component Date Value  . Amphetamine/Methamphetam* 07/12/2020 Positive*  . Barbiturates, Urine 07/12/2020 Negative   . Benzodiazepine, Urine 07/12/2020 Negative   . Cocaine Metabolites, Uri* 07/12/2020 Negative   . Methadone, Urine 07/12/2020 Negative   . Opiates, Urine 07/12/2020 Negative   . Oxycodone, Urine 07/12/2020 Negative   . Tetrahydrocannabinol (TH* 07/12/2020 Negative   Office Visit on 04/30/2020  Component Date Value  . Cholesterol, Total 04/30/2020 161   . LDL Calculated 04/30/2020 95   . HDL 04/30/2020 57   . Triglyceride 04/30/2020 47   . Sodium 04/30/2020 141   . Potassium 04/30/2020 4.3   . Chloride 04/30/2020 107   . Carbon Dioxide (CO2) 04/30/2020 23   . Urea Nitrogen (BUN) 04/30/2020 23*  . Creatinine 04/30/2020 0.7   . Glucose 04/30/2020 83   . Calcium 04/30/2020 9.1   . Anion Gap 04/30/2020 11   . BUN/CREA Ratio 04/30/2020 33*  . Glomerular Filtration Ra* 04/30/2020 106   . WBC (White Blood Cell Co* 04/30/2020 4.7   . Hemoglobin 04/30/2020 13.2   . Hematocrit 04/30/2020 39.9   . Platelets 04/30/2020 242   . MCV (Mean Corpuscular Vo* 04/30/2020 95   . MCH (Mean Corpuscular He* 04/30/2020 31.5   . MCHC (Mean Corpuscular H* 04/30/2020 33.1   . RBC (Red Blood Cell Coun* 04/30/2020 4.19   . RDW-CV (Red Cell Distrib* 04/30/2020 11.9   . MPV (Mean Platelet Volum* 04/30/2020 10.5   . Hepatitis  C Virus Antibo* 04/30/2020 NonReactive   . Vitamin B12 04/30/2020 295      ASSESSMENT: Ms. Hogrefe is a 44 y.o. 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  We had a long discussion about the biopsy results that shows 4 mm of invasive mammary carcinoma with triple negative receptors.  I have started conversation with the medical oncologist and due to the size of the ultrasound and the core biopsy patient may be a candidate of upfront surgery with the decision of having chemotherapy after the surgery.  I   have also discussed with the patient that due to the density of the breast, MRI was recommended.  The MRI will help to make further decisions regarding if patient will need neoadjuvant chemotherapy first and will also help to make decision regarding partial versus total mastectomy.  Patient was oriented again about the pathology results. Surgical alternatives were discussed with patient including partial vs total mastectomy. Surgical technique and post operative care was discussed with patient. Risk of surgery was discussed with patient including but not limited to: wound infection, seroma, hematoma, brachial plexopathy, mondor's disease (thrombosis of small veins of breast), chronic wound pain, breast lymphedema, altered sensation to the nipple and cosmesis among others.   We discussed about the possibility of having reconstruction at the same time of the surgery.  I called the plastic and constructive surgery service and an appointment was made for this Friday at 11 AM.  Malignant neoplasm of upper-outer quadrant of right breast in female, estrogen receptor negative (CMS-HCC) [C50.411, Z17.1]  PLAN: 1. Right total mastectomy with sentinel lymph node biopsy 2. Immediate reconstruction by Plastic and reconstructive surgery  Patient and her husband verbalized understanding, all questions were answered, and were agreeable with the plan outlined above.    I spent a total of 80 minutes in both face-to-face and non-face-to-face activities for this visit on the date of this encounter.  Herbert Pun, MD  Electronically signed by Herbert Pun, MD

## 2020-07-28 ENCOUNTER — Encounter
Admission: RE | Admit: 2020-07-28 | Discharge: 2020-07-28 | Disposition: A | Discharge status: Home / Self Care | Payer: BC Managed Care – PPO | Source: Ambulatory Visit | Attending: General Surgery | Admitting: General Surgery

## 2020-07-28 ENCOUNTER — Other Ambulatory Visit: Payer: Self-pay

## 2020-07-28 ENCOUNTER — Inpatient Hospital Stay: Payer: BC Managed Care – PPO | Attending: Internal Medicine | Admitting: Hospice and Palliative Medicine

## 2020-07-28 DIAGNOSIS — Z171 Estrogen receptor negative status [ER-]: Secondary | ICD-10-CM | POA: Insufficient documentation

## 2020-07-28 DIAGNOSIS — Z01818 Encounter for other preprocedural examination: Secondary | ICD-10-CM | POA: Insufficient documentation

## 2020-07-28 DIAGNOSIS — Z79899 Other long term (current) drug therapy: Secondary | ICD-10-CM | POA: Insufficient documentation

## 2020-07-28 DIAGNOSIS — I10 Essential (primary) hypertension: Secondary | ICD-10-CM | POA: Diagnosis not present

## 2020-07-28 DIAGNOSIS — Z9011 Acquired absence of right breast and nipple: Secondary | ICD-10-CM | POA: Insufficient documentation

## 2020-07-28 DIAGNOSIS — C50411 Malignant neoplasm of upper-outer quadrant of right female breast: Secondary | ICD-10-CM | POA: Insufficient documentation

## 2020-07-28 DIAGNOSIS — K219 Gastro-esophageal reflux disease without esophagitis: Secondary | ICD-10-CM | POA: Insufficient documentation

## 2020-07-28 HISTORY — DX: Gastro-esophageal reflux disease without esophagitis: K21.9

## 2020-07-28 HISTORY — DX: Personal history of urinary calculi: Z87.442

## 2020-07-28 HISTORY — DX: Other specified postprocedural states: R11.2

## 2020-07-28 HISTORY — DX: Other specified postprocedural states: Z98.890

## 2020-07-28 HISTORY — DX: Other complications of anesthesia, initial encounter: T88.59XA

## 2020-07-28 NOTE — H&P (View-Only) (Signed)
ICD-10-CM   1. Carcinoma of upper-outer quadrant of right breast in female, estrogen receptor negative [facility])  C50.411    Z17.1       Patient ID: Beth Wells, female    DOB: [birth year], 44 y.o.   MRN: [id number]   History of Present Illness: Beth Wells is a 44 y.o.  female  with a history of Right breast invasive mammary carcinoma.  She presents for preoperative evaluation for upcoming procedure, Right total mastectomy with sentinel node with Dr. Windell Moment and reconstruction with placement of tissue expander and Flex HD with Dr. Marla Roe, scheduled for 08/05/20. She is joined today by her husband via phone.  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 Patient is 5 ft 2 in tall and weighs 103 lbs. Preoperative bra size is 32B and she would like to be around the same size.  Job: Electrical engineer in [place]  PMH Significant for: HTN, GERD, Idiopathic small fiber neuropathy  The patient has had problems with anesthesia. PONV and dizziness. Reports the Scop patch has helped in the past. Reports dizziness was due to low BP.  Past Medical History: Allergies: No Known Allergies  Current Medications:  Current Outpatient Medications:  .  amphetamine-dextroamphetamine (ADDERALL XR) 30 MG 24 hr capsule, Take 30 mg by mouth daily. , Disp: , Rfl:  .  DULoxetine (CYMBALTA) 20 MG capsule, Take 2 capsules (40 mg total) by mouth at bedtime., Disp: 60 capsule, Rfl: 2 .  ibuprofen (ADVIL) 200 MG tablet, Take 400 mg by mouth every 6 (six) hours as needed for headache or moderate pain., Disp: , Rfl:  .  lisinopril (ZESTRIL) 5 MG tablet, Take 5 mg by mouth every evening. , Disp: , Rfl:  .  pregabalin (LYRICA) 100 MG capsule, Take 1 capsule (100 mg total) by mouth 2 (two) times daily., Disp: 180 capsule, Rfl: 0 .  Probiotic Product (PROBIOTIC ADVANCED PO), Take 1 capsule by mouth daily. , Disp: , Rfl:   .  trolamine salicylate (ASPERCREME) 10 % cream, Apply 1 application topically as needed for muscle pain., Disp: , Rfl:  .  ALPHA-LIPOIC ACID PO, Take 500 mg by mouth daily. (Patient not taking: Reported on 07/29/2020), Disp: , Rfl:  .  Ascorbic Acid (VITAMIN C) 1000 MG tablet, Take 1,000 mg by mouth daily.  (Patient not taking: Reported on 07/28/2020), Disp: , Rfl:   Past Medical Problems: Past Medical History:  Diagnosis Date  . Carcinoma of upper-outer quadrant of right breast in female, estrogen receptor negative ([place]) 07/13/2020  . Complication of anesthesia    hard to wake up-very dizzy feeling like she was going to pass out  . GERD (gastroesophageal reflux disease)    no meds  . H/O foot surgery 08/2018  . History of kidney stones    h/o  . Hypertension   . PONV (postoperative nausea and vomiting)    n/v aftter kidney stone surgery    Past Surgical History: Past Surgical History:  Procedure Laterality Date  . BREAST BIOPSY Right 07/06/2020   u/s bx Q clip path pending  . BUNIONECTOMY    . CERVICAL CONE BIOPSY    . DILATION AND EVACUATION N/A 03/12/2018   Procedure: DILATATION AND EVACUATION;  Surgeon: [name], MD;  Location: [facility] ORS;  Service: Gynecology;  Laterality: N/A;  . KIDNEY STONE SURGERY    . LAPAROSCOPIC OVARIAN CYSTECTOMY Right 06/07/2019   Procedure: LAPAROSCOPIC  OVARIAN CYSTECTOMY;  Surgeon: [name], MD;  Location: [facility] ORS;  Service: Gynecology;  Laterality: Right;  . LAPAROSCOPY N/A 06/07/2019   Procedure: LAPAROSCOPY Luvenia Redden BIOPSIES;  Surgeon: [name], MD;  Location: [facility] ORS;  Service: Gynecology;  Laterality: N/A;    Social History: Social History   Socioeconomic History  . Marital status: Married    Spouse name: Not on file  . Number of children: Not on file  . Years of education: Not on file  . Highest education level: Not on file  Occupational History  . Not on file  Tobacco Use  . Smoking status:  Never Smoker  . Smokeless tobacco: Never Used  Vaping Use  . Vaping Use: Never used  Substance and Sexual Activity  . Alcohol use: Not Currently  . Drug use: Never  . Sexual activity: Yes  Other Topics Concern  . 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 Lives in [place].    Social Determinants of Health   Financial Resource Strain:   . Difficulty of Paying Living Expenses: Not on file  Food Insecurity:   . Worried About Charity fundraiser in the Last Year: Not on file  . Ran Out of Food in the Last Year: Not on file  Transportation Needs:   . Lack of Transportation (Medical): Not on file  . Lack of Transportation (Non-Medical): Not on file  Physical Activity:   . Days of Exercise per Week: Not on file  . Minutes of Exercise per Session: Not on file  Stress:   . Feeling of Stress : Not on file  Social Connections:   . Frequency of Communication with Friends and Family: Not on file  . Frequency of Social Gatherings with Friends and Family: Not on file  . Attends Religious Services: Not on file  . Active Member of Clubs or Organizations: Not on file  . Attends Archivist Meetings: Not on file  . Marital Status: Not on file  Intimate Partner Violence:   . Fear of Current or Ex-Partner: Not on file  . Emotionally Abused: Not on file  . Physically Abused: Not on file  . Sexually Abused: Not on file    Family History: Family History  Problem Relation Age of Onset  . Cancer Other        great aunt- GI cancer  . Breast cancer Neg Hx     Review of Systems: Review of Systems  Constitutional: Negative for chills and fever.  HENT: Negative for congestion and sore throat.   Respiratory: Negative for cough and shortness of breath.   Cardiovascular: Negative for chest pain.  Gastrointestinal: Negative for abdominal pain, nausea and vomiting.  Skin: Negative for itching and rash.     Physical Exam: Vital Signs BP 133/89 (BP Location: Left Arm, Patient Position: Sitting, Cuff Size: Normal)   Pulse 85   Temp 98.4 F (36.9 C) (Oral)   Ht 5\' 2"  (1.575 m)   Wt 103 lb 9.6 oz (47 kg)   LMP 07/10/2020 (Exact Date)   SpO2 99%   BMI 18.95 kg/m  Physical Exam Vitals and nursing note reviewed.  Constitutional:      General: She is not in acute distress.    Appearance: Normal appearance. She is normal weight. She is not ill-appearing.  HENT:     Head: Normocephalic and atraumatic.  Eyes:     Extraocular Movements: Extraocular movements intact.  Cardiovascular:     Rate and Rhythm: Normal rate and regular rhythm.     Pulses: Normal pulses.     Heart sounds: Normal heart sounds.  Pulmonary:     Effort: Pulmonary effort is normal.     Breath sounds: Normal breath sounds. No wheezing, rhonchi or rales.  Abdominal:     General: Bowel sounds are normal.     Palpations: Abdomen is soft.  Musculoskeletal:        General: No swelling. Normal range of motion.     Cervical back: Normal range of motion.  Skin:    General: Skin is warm and dry.     Coloration: Skin is not pale.     Findings: No erythema or rash.  Neurological:     General: No focal deficit present.     Mental Status: She is alert and oriented to person, place, and time.  Psychiatric:        Mood and Affect: Mood normal.        Behavior: Behavior normal.        Thought Content: Thought content normal.        Judgment: Judgment normal.     Assessment/Plan:  Ms. Bastone scheduled for right breast total mastectomy with sentinel node with Dr. Peyton Najjar and reconstruction with placement of tissue expander and Flex HD with Dr. Marla Roe.  Risks, benefits, and alternatives of procedure discussed, questions answered and consent obtained.    Smoking Status: non-smoker; Counseling Given? N/A Last Mammogram: 10/21; Results: Right breast upper outer quadrant invasive mammary carcinoma; left breast no suspicious  findings.  Caprini Score: 5 High; Risk Factors include: 44 year old female, breast cancer, BMI < 25, and length of planned surgery. Recommendation for mechanical and pharmacological prophylaxis during surgery. Encourage early ambulation.   Pictures obtained: 07/16/20  Post-op Rx sent to pharmacy: Keflex, Norco, Zofran, Valium, IBU, Tylenol, Diflucan  Reports yeast infections with abx use, requested diflucan  Patient was provided with the tissue expander risks and General Surgical Risk consent document and Pain Medication Agreement prior to their appointment.  They had adequate time to read through the risk consent documents and Pain Medication Agreement. We also discussed them in person together during this preop appointment. All of their questions were answered to their satisfaction.  Recommended calling if they have any further questions.  Risk consent form and Pain Medication Agreement to be scanned into patient's chart.  The risks that can be encountered with and after placement of a breast expander placement were discussed and include the following but not limited to these: bleeding, infection, delayed healing, anesthesia risks, skin sensation changes, injury to structures including nerves, blood vessels, and muscles which may be temporary or permanent, allergies to tape, suture materials and glues, blood products, topical preparations or injected agents, skin contour irregularities, skin discoloration and swelling, deep vein thrombosis, cardiac and pulmonary complications, pain, which may persist, fluid accumulation, wrinkling of the skin over the expander, changes in nipple or breast sensation, expander leakage or rupture, faulty position of the expander, persistent pain, formation of tight scar tissue around the expander (capsular contracture), possible need for revisional surgery or staged procedures.  Electronically signed by: Threasa Heads, PA-C 07/29/2020 11:00 AM

## 2020-07-28 NOTE — Patient Instructions (Addendum)
Your procedure is scheduled on:08-05-20 THURSDAY Report to [place] ON RIGHT @ 11:30 AM  REMEMBER: Instructions that are not followed completely may result in serious medical risk, up to and including death; or upon the discretion of your surgeon and anesthesiologist your surgery may need to be rescheduled.  Do not eat food after midnight the night before surgery.  No gum chewing, lozengers or hard candies.  You may however, drink CLEAR liquids up to 2 hours before you are scheduled to arrive for your surgery. Do not drink anything within 2 hours of your scheduled arrival time.  Clear liquids include: - water  - apple juice without pulp - gatorade (not RED, PURPLE, OR BLUE) - black coffee or tea (Do NOT add milk or creamers to the coffee or tea) Do NOT drink anything that is not on this list.   TAKE THESE MEDICATIONS THE MORNING OF SURGERY WITH A SIP OF WATER: -LYRICA (PREGABALIN)  One week prior to surgery: Stop Anti-inflammatories (NSAIDS) such as Advil, Aleve, Ibuprofen, Motrin, Naproxen, Naprosyn and Aspirin based products such as Excedrin, Goodys Powder, BC Powder-OK TO TAKE TYLENOL IF NEEDED  Stop ANY OVER THE COUNTER supplements until after surgery-STOP ALPHA LIPOIC ACID NOW-YOU MAY RESUME AFTER YOUR SURGERY (You may continue taking probiotic.)  No Alcohol for 24 hours before or after surgery.  No Smoking including e-cigarettes for 24 hours prior to surgery.  No chewable tobacco products for at least 6 hours prior to surgery.  No nicotine patches on the day of surgery.  Do not use any "recreational" drugs for at least a week prior to your surgery.  Please be advised that the combination of cocaine and anesthesia may have negative outcomes, up to and including death. If you test positive for cocaine, your surgery will be cancelled.  On the morning of surgery brush your teeth with toothpaste and water, you may rinse your mouth with mouthwash if  you wish. Do not swallow any toothpaste or mouthwash.  Do not wear jewelry, make-up, hairpins, clips or nail polish.  Do not wear lotions, powders, or perfumes.   Do not shave 48 hours prior to surgery.   Contact lenses, hearing aids and dentures may not be worn into surgery.  Do not bring valuables to the hospital. [facility] is not responsible for any missing/lost belongings or valuables.   Use CHG Soap as directed on instruction sheet.  Notify your doctor if there is any change in your medical condition (cold, fever, infection).  Wear comfortable clothing (specific to your surgery type) to the hospital.  Plan for stool softeners for home use; pain medications have a tendency to cause constipation. You can also help prevent constipation by eating foods high in fiber such as fruits and vegetables and drinking plenty of fluids as your diet allows.  After surgery, you can help prevent lung complications by doing breathing exercises.  Take deep breaths and cough every 1-2 hours. Your doctor may order a device called an Incentive Spirometer to help you take deep breaths. When coughing or sneezing, hold a pillow firmly against your incision with both hands. This is called "splinting." Doing this helps protect your incision. It also decreases belly discomfort.  If you are being admitted to the hospital overnight, leave your suitcase in the car. After surgery it may be brought to your room.  If you are being discharged the day of surgery, you will not be allowed to drive home. You will need a  responsible adult (18 years or older) to drive you home and stay with you that night.   If you are taking public transportation, you will need to have a responsible adult (18 years or older) with you. Please confirm with your physician that it is acceptable to use public transportation.   Please call the [facility] at [phone] if you have any questions about these  instructions.  Visitation Policy:  Patients undergoing a surgery or procedure may have one family member or support person with them as long as that person is not COVID-19 positive or experiencing its symptoms.  That person may remain in the waiting area during the procedure.  Inpatient Visitation Update:   In an effort to ensure the safety of our team members and our patients, we are implementing a change to our visitation policy:  Effective Monday, Aug. 9, at 7 a.m., inpatients will be allowed one support person.  o The support person may change daily.  o The support person must pass our screening, gel in and out, and wear a mask at all times, including in the patient's room.  o Patients must also wear a mask when staff or their support person are in the room.  o Masking is required regardless of vaccination status.  Systemwide, no visitors 17 or younger.

## 2020-07-28 NOTE — Progress Notes (Signed)
ICD-10-CM   1. Carcinoma of upper-outer quadrant of right breast in female, estrogen receptor negative [facility])  C50.411    Z17.1       Patient ID: Beth Wells, female    DOB: [birth year], 44 y.o.   MRN: [id number]   History of Present Illness: Beth Wells is a 44 y.o.  female  with a history of Right breast invasive mammary carcinoma.  She presents for preoperative evaluation for upcoming procedure, Right total mastectomy with sentinel node with Dr. Windell Moment and reconstruction with placement of tissue expander and Flex HD with Dr. Marla Roe, scheduled for 08/05/20. She is joined today by her husband via phone.  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 Patient is 5 ft 2 in tall and weighs 103 lbs. Preoperative bra size is 32B and she would like to be around the same size.  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 PONV and dizziness. Reports the Scop patch has helped in the past. Reports dizziness was due to low BP.  Past Medical History: Allergies: No Known Allergies  Current Medications:  Current Outpatient Medications:  .  amphetamine-dextroamphetamine (ADDERALL XR) 30 MG 24 hr capsule, Take 30 mg by mouth daily. , Disp: , Rfl:  .  DULoxetine (CYMBALTA) 20 MG capsule, Take 2 capsules (40 mg total) by mouth at bedtime., Disp: 60 capsule, Rfl: 2 .  ibuprofen (ADVIL) 200 MG tablet, Take 400 mg by mouth every 6 (six) hours as needed for headache or moderate pain., Disp: , Rfl:  .  lisinopril (ZESTRIL) 5 MG tablet, Take 5 mg by mouth every evening. , Disp: , Rfl:  .  pregabalin (LYRICA) 100 MG capsule, Take 1 capsule (100 mg total) by mouth 2 (two) times daily., Disp: 180 capsule, Rfl: 0 .  Probiotic Product (PROBIOTIC ADVANCED PO), Take 1 capsule by mouth daily. , Disp: , Rfl:   .  trolamine salicylate (ASPERCREME) 10 % cream, Apply 1 application topically as needed for muscle pain., Disp: , Rfl:  .  ALPHA-LIPOIC ACID PO, Take 500 mg by mouth daily. (Patient not taking: Reported on 07/29/2020), Disp: , Rfl:  .  Ascorbic Acid (VITAMIN C) 1000 MG tablet, Take 1,000 mg by mouth daily.  (Patient not taking: Reported on 07/28/2020), Disp: , Rfl:   Past Medical Problems: Past Medical History:  Diagnosis Date  . Carcinoma of upper-outer quadrant of right breast in female, estrogen receptor negative ([place]) 07/13/2020  . Complication of anesthesia    hard to wake up-very dizzy feeling like she was going to pass out  . GERD (gastroesophageal reflux disease)    no meds  . H/O foot surgery 08/2018  . History of kidney stones    h/o  . Hypertension   . PONV (postoperative nausea and vomiting)    n/v aftter kidney stone surgery    Past Surgical History: Past Surgical History:  Procedure Laterality Date  . BREAST BIOPSY Right 07/06/2020   u/s bx Q clip path pending  . BUNIONECTOMY    . CERVICAL CONE BIOPSY    . DILATION AND EVACUATION N/A 03/12/2018   Procedure: DILATATION AND EVACUATION;  Surgeon: [name], MD;  Location: [facility] ORS;  Service: Gynecology;  Laterality: N/A;  . KIDNEY STONE SURGERY    . LAPAROSCOPIC OVARIAN CYSTECTOMY Right 06/07/2019   Procedure: LAPAROSCOPIC  OVARIAN CYSTECTOMY;  Surgeon: [name], MD;  Location: [facility] ORS;  Service: Gynecology;  Laterality: Right;  . LAPAROSCOPY N/A 06/07/2019   Procedure: LAPAROSCOPY Luvenia Redden BIOPSIES;  Surgeon: [name], MD;  Location: [facility] ORS;  Service: Gynecology;  Laterality: N/A;    Social History: Social History   Socioeconomic History  . Marital status: Married    Spouse name: Not on file  . Number of children: Not on file  . Years of education: Not on file  . Highest education level: Not on file  Occupational History  . Not on file  Tobacco Use  . Smoking status:  Never Smoker  . Smokeless tobacco: Never Used  Vaping Use  . Vaping Use: Never used  Substance and Sexual Activity  . Alcohol use: Not Currently  . Drug use: Never  . Sexual activity: Yes  Other Topics Concern  . 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 Lives in [place].    Social Determinants of Health   Financial Resource Strain:   . Difficulty of Paying Living Expenses: Not on file  Food Insecurity:   . Worried About Charity fundraiser in the Last Year: Not on file  . Ran Out of Food in the Last Year: Not on file  Transportation Needs:   . Lack of Transportation (Medical): Not on file  . Lack of Transportation (Non-Medical): Not on file  Physical Activity:   . Days of Exercise per Week: Not on file  . Minutes of Exercise per Session: Not on file  Stress:   . Feeling of Stress : Not on file  Social Connections:   . Frequency of Communication with Friends and Family: Not on file  . Frequency of Social Gatherings with Friends and Family: Not on file  . Attends Religious Services: Not on file  . Active Member of Clubs or Organizations: Not on file  . Attends Archivist Meetings: Not on file  . Marital Status: Not on file  Intimate Partner Violence:   . Fear of Current or Ex-Partner: Not on file  . Emotionally Abused: Not on file  . Physically Abused: Not on file  . Sexually Abused: Not on file    Family History: Family History  Problem Relation Age of Onset  . Cancer Other        great aunt- GI cancer  . Breast cancer Neg Hx     Review of Systems: Review of Systems  Constitutional: Negative for chills and fever.  HENT: Negative for congestion and sore throat.   Respiratory: Negative for cough and shortness of breath.   Cardiovascular: Negative for chest pain.  Gastrointestinal: Negative for abdominal pain, nausea and vomiting.  Skin: Negative for itching and rash.     Physical Exam: Vital Signs BP 133/89 (BP Location: Left Arm, Patient Position: Sitting, Cuff Size: Normal)   Pulse 85   Temp 98.4 F (36.9 C) (Oral)   Ht 5\' 2"  (1.575 m)   Wt 103 lb 9.6 oz (47 kg)   LMP 07/10/2020 (Exact Date)   SpO2 99%   BMI 18.95 kg/m  Physical Exam Vitals and nursing note reviewed.  Constitutional:      General: She is not in acute distress.    Appearance: Normal appearance. She is normal weight. She is not ill-appearing.  HENT:     Head: Normocephalic and atraumatic.  Eyes:     Extraocular Movements: Extraocular movements intact.  Cardiovascular:     Rate and Rhythm: Normal rate and regular rhythm.     Pulses: Normal pulses.     Heart sounds: Normal heart sounds.  Pulmonary:     Effort: Pulmonary effort is normal.     Breath sounds: Normal breath sounds. No wheezing, rhonchi or rales.  Abdominal:     General: Bowel sounds are normal.     Palpations: Abdomen is soft.  Musculoskeletal:        General: No swelling. Normal range of motion.     Cervical back: Normal range of motion.  Skin:    General: Skin is warm and dry.     Coloration: Skin is not pale.     Findings: No erythema or rash.  Neurological:     General: No focal deficit present.     Mental Status: She is alert and oriented to person, place, and time.  Psychiatric:        Mood and Affect: Mood normal.        Behavior: Behavior normal.        Thought Content: Thought content normal.        Judgment: Judgment normal.     Assessment/Plan:  Ms. Hebel scheduled for right breast total mastectomy with sentinel node with Dr. Peyton Najjar and reconstruction with placement of tissue expander and Flex HD with Dr. Marla Roe.  Risks, benefits, and alternatives of procedure discussed, questions answered and consent obtained.    Smoking Status: non-smoker; Counseling Given? N/A Last Mammogram: 10/21; Results: Right breast upper outer quadrant invasive mammary carcinoma; left breast no suspicious  findings.  Caprini Score: 5 High; Risk Factors include: 44 year old female, breast cancer, BMI < 25, and length of planned surgery. Recommendation for mechanical and pharmacological prophylaxis during surgery. Encourage early ambulation.   Pictures obtained: 07/16/20  Post-op Rx sent to pharmacy: Keflex, Norco, Zofran, Valium, IBU, Tylenol, Diflucan  Reports yeast infections with abx use, requested diflucan  Patient was provided with the tissue expander risks and General Surgical Risk consent document and Pain Medication Agreement prior to their appointment.  They had adequate time to read through the risk consent documents and Pain Medication Agreement. We also discussed them in person together during this preop appointment. All of their questions were answered to their satisfaction.  Recommended calling if they have any further questions.  Risk consent form and Pain Medication Agreement to be scanned into patient's chart.  The risks that can be encountered with and after placement of a breast expander placement were discussed and include the following but not limited to these: bleeding, infection, delayed healing, anesthesia risks, skin sensation changes, injury to structures including nerves, blood vessels, and muscles which may be temporary or permanent, allergies to tape, suture materials and glues, blood products, topical preparations or injected agents, skin contour irregularities, skin discoloration and swelling, deep vein thrombosis, cardiac and pulmonary complications, pain, which may persist, fluid accumulation, wrinkling of the skin over the expander, changes in nipple or breast sensation, expander leakage or rupture, faulty position of the expander, persistent pain, formation of tight scar tissue around the expander (capsular contracture), possible need for revisional surgery or staged procedures.  Electronically signed by: Threasa Heads, PA-C 07/29/2020 11:00 AM

## 2020-07-28 NOTE — Progress Notes (Signed)
Multidisciplinary Oncology Council Documentation  Beth Wells was presented by our [facility] on 07/28/2020, which included representatives from:  . Palliative Care . Dietitian . Physical/Occupational Therapist . Speech Therapist . Survivorship Nurse . Nurse Navigator . Social work    Beth Wells currently presents with history of stage I breast cancer  We reviewed previous medical and familial history, history of present illness, and recent lab results along with all available histopathologic and imaging studies. The [place] considered available treatment options and made the following recommendations/referrals: Orders Placed This Encounter  Procedures  . Amb Referral to Nutrition and Diabetic Education    The MOC is a meeting of clinicians from various specialty areas who evaluate and discuss patients for whom a multidisciplinary approach is being considered. Final determinations in the plan of care are those of the provider(s).   Today's extended care, comprehensive team conference, Beth Wells was not present for the discussion and was not examined.

## 2020-07-29 ENCOUNTER — Ambulatory Visit (INDEPENDENT_AMBULATORY_CARE_PROVIDER_SITE_OTHER): Payer: BC Managed Care – PPO | Admitting: Plastic Surgery

## 2020-07-29 ENCOUNTER — Encounter: Payer: Self-pay | Admitting: Plastic Surgery

## 2020-07-29 ENCOUNTER — Encounter
Admission: RE | Admit: 2020-07-29 | Discharge: 2020-07-29 | Disposition: A | Discharge status: Home / Self Care | Payer: BC Managed Care – PPO | Source: Ambulatory Visit | Attending: General Surgery | Admitting: General Surgery

## 2020-07-29 VITALS — BP 133/89 | HR 85 | Temp 98.4°F | Ht 62.0 in | Wt 103.6 lb

## 2020-07-29 DIAGNOSIS — I10 Essential (primary) hypertension: Secondary | ICD-10-CM | POA: Insufficient documentation

## 2020-07-29 DIAGNOSIS — Z01810 Encounter for preprocedural cardiovascular examination: Secondary | ICD-10-CM | POA: Insufficient documentation

## 2020-07-29 DIAGNOSIS — C50411 Malignant neoplasm of upper-outer quadrant of right female breast: Secondary | ICD-10-CM

## 2020-07-29 DIAGNOSIS — Z171 Estrogen receptor negative status [ER-]: Secondary | ICD-10-CM

## 2020-07-29 MED ORDER — FLUCONAZOLE 150 MG PO TABS: 150.0000 mg | ORAL_TABLET | Freq: Once | ORAL | 0 refills | Status: AC

## 2020-07-29 MED ORDER — CEPHALEXIN 500 MG PO CAPS: 500.0000 mg | ORAL_CAPSULE | Freq: Four times a day (QID) | ORAL | 0 refills | Status: AC

## 2020-07-29 MED ORDER — ACETAMINOPHEN 500 MG PO TABS: 500.0000 mg | ORAL_TABLET | Freq: Four times a day (QID) | ORAL | 0 refills | Status: DC | PRN

## 2020-07-29 MED ORDER — IBUPROFEN 600 MG PO TABS: 600.0000 mg | ORAL_TABLET | Freq: Four times a day (QID) | ORAL | 0 refills | Status: DC | PRN

## 2020-07-29 MED ORDER — DIAZEPAM 2 MG PO TABS: 2.0000 mg | ORAL_TABLET | Freq: Two times a day (BID) | ORAL | 0 refills | Status: DC | PRN

## 2020-07-29 MED ORDER — ONDANSETRON HCL 4 MG PO TABS: 4.0000 mg | ORAL_TABLET | Freq: Three times a day (TID) | ORAL | 0 refills | Status: DC | PRN

## 2020-07-29 MED ORDER — HYDROCODONE-ACETAMINOPHEN 5-325 MG PO TABS: 1.0000 | ORAL_TABLET | Freq: Three times a day (TID) | ORAL | 0 refills | Status: DC | PRN

## 2020-07-30 ENCOUNTER — Inpatient Hospital Stay: Admission: RE | Admit: 2020-07-30 | Payer: BC Managed Care – PPO | Source: Ambulatory Visit

## 2020-08-03 ENCOUNTER — Encounter: Payer: Self-pay | Admitting: Licensed Clinical Social Worker

## 2020-08-03 ENCOUNTER — Other Ambulatory Visit
Admission: RE | Admit: 2020-08-03 | Discharge: 2020-08-03 | Disposition: A | Discharge status: Home / Self Care | Payer: BC Managed Care – PPO | Source: Ambulatory Visit | Attending: General Surgery | Admitting: General Surgery

## 2020-08-03 ENCOUNTER — Ambulatory Visit: Payer: Self-pay | Admitting: Licensed Clinical Social Worker

## 2020-08-03 ENCOUNTER — Other Ambulatory Visit: Payer: Self-pay

## 2020-08-03 ENCOUNTER — Telehealth: Payer: Self-pay | Admitting: Licensed Clinical Social Worker

## 2020-08-03 ENCOUNTER — Encounter: Payer: Self-pay | Admitting: *Deleted

## 2020-08-03 DIAGNOSIS — Z01812 Encounter for preprocedural laboratory examination: Secondary | ICD-10-CM | POA: Diagnosis not present

## 2020-08-03 DIAGNOSIS — Z20822 Contact with and (suspected) exposure to covid-19: Secondary | ICD-10-CM | POA: Diagnosis not present

## 2020-08-03 DIAGNOSIS — Z171 Estrogen receptor negative status [ER-]: Secondary | ICD-10-CM

## 2020-08-03 DIAGNOSIS — Z1501 Genetic susceptibility to malignant neoplasm of breast: Secondary | ICD-10-CM | POA: Insufficient documentation

## 2020-08-03 LAB — SARS CORONAVIRUS 2 (TAT 6-24 HRS): SARS Coronavirus 2: NEGATIVE

## 2020-08-03 NOTE — Progress Notes (Signed)
Patient called with questions regarding follow up with Dr. Rogue Bussing after surgery.  I have her scheduled for 08/17/20 @ 3:15.  She is planning on a mastectomy with reconstructive surgery.  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  I told her I was not familiar with that mutation, but had read Brianna's notes.  Offered support.  She is to call with any questions or needs.

## 2020-08-03 NOTE — Progress Notes (Signed)
Genetic Test Results  HPI:  Ms. Laverdiere was previously seen in the [place] clinic due to a personal history of breast cancer and concerns regarding a hereditary predisposition to cancer. Please refer to our prior cancer genetics clinic note for more information regarding our discussion, assessment and recommendations, at the time. Ms. Alcaide recent genetic test results were disclosed to her, as were recommendations warranted by these results. These results and recommendations are discussed in more detail below.  CANCER HISTORY:  Oncology History Overview Note  # OCT 2021-RIGHT BREAST- TRIPLE NEGATIVE [place]; [US/mammo-33m]; Dr.Cintron; OCT 2021-USThere is a 7 mm mass in the right breast at 10 o'clock, favored to represent a complicated cyst;  No evidence of right axillary lymphadenopathy].   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 The report date is 08/02/2020.  The Multi-Cancer Panel offered by [facility] includes sequencing and/or deletion duplication testing of the following 85 genes: AIP, ALK, APC, ATM, AXIN2,BAP1,  BARD1, BLM, BMPR1A, BRCA1, BRCA2, BRIP1, CASR, CDC73, CDH1, CDK4, CDKN1B, CDKN1C, CDKN2A (p14ARF), CDKN2A (p16INK4a), CEBPA, CHEK2, CTNNA1, DICER1, DIS3L2, EGFR (c.2369C>T, p.Thr790Met variant only), EPCAM (Deletion/duplication testing only), FH, FLCN, GATA2, GPC3, GREM1 (Promoter region deletion/duplication testing only), HOXB13 (c.251G>A, p.Gly84Glu), HRAS, KIT, MAX, MEN1, MET, MITF  (c.952G>A, p.Glu318Lys variant only), MLH1, MSH2, MSH3, MSH6, MUTYH, NBN, NF1, NF2, NTHL1, PALB2, PDGFRA, PHOX2B, PMS2, POLD1, POLE, POT1, PRKAR1A, PTCH1, PTEN, RAD50, RAD51C, RAD51D, RB1, RECQL4, RET, RNF43, RUNX1, SDHAF2, SDHA (sequence changes only), SDHB, SDHC, SDHD, SMAD4, SMARCA4, SMARCB1, SMARCE1, STK11, SUFU, TERC, TERT, TMEM127, TP53, TSC1, TSC2, VHL, WRN and WT1.      FAMILY HISTORY:  We obtained a detailed, 4-generation family history.  Significant diagnoses are listed below: Family History  Problem Relation Age of Onset  . Cancer Other        great aunt- GI cancer  . Breast cancer Neg Hx    Ms. Korf has one sister. She has not had cancer.  Ms. LIrionmother is living at 89and has not had cancer. Patient has 1 maternal aunt, no cancers. Patient does not have maternal first cousins. Maternal grandmother died at 836 grandfather died in his 649s Grandmother had 3 sisters, one had lymphoma and one had an abdominal cancer, unsure exact type.  Ms. LCarlofather is living at 752 no history of cancer. He is adopted and patient does not have any further information about this side of the family.  Ms. LBoranis unaware of previous family history of genetic testing for hereditary cancer risks. Patient's maternal ancestors are of [place], and paternal ancestors are of unknown descent. There is no reported Ashkenazi Jewish ancestry. There is no known consanguinity.   GENETIC TEST RESULTS: Genetic testing reported out on [facility] Breast Cancer STAT panel 07/21/2020 was normal. [facility] found a single, pathogenic variant in BARD1 called [id number]/04/2020. The remainder of testing was negative.   The STAT Breast cancer panel offered by [facility] includes sequencing  and rearrangement analysis for the following 9 genes:  ATM, BRCA1, BRCA2, CDH1, CHEK2, PALB2, PTEN, STK11 and TP53.    The Multi-Cancer Panel offered by [facility]  includes sequencing and/or deletion duplication testing of the following 85 genes: AIP, ALK, APC, ATM, AXIN2,BAP1,  BARD1, BLM, BMPR1A, BRCA1, BRCA2, BRIP1, CASR, CDC73, CDH1, CDK4, CDKN1B, CDKN1C, CDKN2A (p14ARF), CDKN2A (p16INK4a), CEBPA, CHEK2, CTNNA1, DICER1, DIS3L2, EGFR (c.2369C>T, p.Thr790Met variant only), EPCAM (Deletion/duplication testing only), FH, FLCN, GATA2, GPC3, GREM1 (Promoter region deletion/duplication testing only), HOXB13 (c.251G>A, p.Gly84Glu), HRAS, KIT, MAX, MEN1, MET, MITF (c.952G>A, p.Glu318Lys variant only), MLH1, MSH2, MSH3, MSH6, MUTYH, NBN, NF1, NF2, NTHL1, PALB2, PDGFRA, PHOX2B, PMS2, POLD1, POLE, POT1, PRKAR1A, PTCH1, PTEN, RAD50, RAD51C, RAD51D, RB1, RECQL4, RET, RNF43, RUNX1, SDHAF2, SDHA (sequence changes only), SDHB, SDHC, SDHD, SMAD4, SMARCA4, SMARCB1, SMARCE1, STK11, SUFU, TERC, TERT, TMEM127, TP53, TSC1, TSC2, VHL, WRN and WT1.   The test report has been scanned into EPIC and is located under the Molecular Pathology section of the Results Review tab.  A portion of the result report is included below for reference.     We discussed with Ms. Barot that because current genetic testing is not perfect, it is possible there may be a gene mutation in one of these genes that current testing cannot detect, but that chance is small.  We also discussed, that there could be another gene that has not yet been discovered, or that we have not yet tested, that is responsible for the cancer diagnoses in the family. It is also possible there is a hereditary cause for the cancer in the family that Ms. Schmoker did not inherit and therefore was not identified in her testing.  Therefore, it is important to remain in touch with cancer genetics in the future so that we can continue to offer Ms. Varas the most up to date genetic testing.   DISCUSSION: BARD1  Clinical condition Women who are carriers of a single pathogenic BARD1 variant have an increased risk for breast cancer, although  specific risks are not yet determined (PMID: 79480165 [id number] ). There may also be an increased risk for ovarian cancer, but the current evidence is preliminary (PMID: 78675449 20100712 19758832 [id number] ). An individual with a BARD1 pathogenic variant will not necessarily develop cancer in her lifetime, but the risk for cancer is increased over the general population risk.  Gene information The BARD1 gene forms a BRCA1-BARD1 heterodimer and coordinates a diverse range of cellular pathways, such as DNA damage repair, ubiquitination, and transcriptional regulation to maintain genomic stability. It is believed to play a central role in the control of the cell cycle in response to DNA damage [employer], [employer] - [place] Accessed September 2015). If there is a pathogenic variant in this gene that prevents it from functioning normally, the risk of developing certain types of cancers is increased.  Inheritance Hereditary predisposition to cancer due to pathogenic variants in the BARD1 gene has autosomal dominant inheritance. This means that an individual with a pathogenic variant has a 50% chance of passing the condition on to their offspring. Once a pathogenic mutation is detected in an individual, it is possible to identify at-risk relatives who can pursue testing for this specific familial variant. Many cases are inherited from a parent, but some cases can occur spontaneously (i.e., an individual with a pathogenic variant has parents who do not have it). An individual with a variant in [place] has a 50% risk of passing that variant on to offspring.  Management  Having asingle pathogenicvariant in [place] has been associated with increased risk for breast cancer,with evidence for increased risk for triple negative disease, although specific risks are not yet determined. There may also be an increased risk for ovarian cancer, but the current evidence is preliminaryand insufficient to make  medical management recommendations.This is a gene for which there is limited data and evidence, however in the future as we learn more management recommendations and more information about this gene may emerge.   From NCCN Breast, Ovarian and Pancreatic v.2.2021:    This result likely explains, at least in part, Ms. Macadam's personal history of cancer.   An individual's cancer risk and medical management are not determined by genetic test results alone. Overall cancer risk assessment incorporates additional factors, including personal medical history, family history, and any available genetic information that may result in a personalized plan for cancer prevention and surveillance.  Even though the data regarding specific cancer risk estimates with pathogenic BARD1 variants are limited, knowing if a pathogenic variant is present is advantageous. At-risk relatives can be identified, enabling pursuit of a diagnostic evaluation. Further, the available information regarding hereditary cancer susceptibility genes is constantly evolving and more clinically relevant data regarding BARD1 are likely to become available in the near future. Awareness of this cancer predisposition encourages patients and their providers to inform at-risk family members, to diligently follow standard screening protocols, and to be vigilant in maintaining close and regular contact with their local genetics clinic in anticipation of new information.  FAMILY MEMBERS: It is important that all of Ms. Yau''s relatives (both men and women) know of the presence of this gene mutation. Site-specific genetic testing can sort out who in the family is at risk and who is not.   Ms. Primo parents and sister have a 50% chance to have this mutation. We recommend they have genetic testing for this same mutation, as identifying the presence of this mutation would allow them to also take advantage of risk-reducing measures.   PLAN:  1.  These results will bemade availableto her care team, Dr.Brahmanday and Dr. Windell Moment as well as her PCP. She would like these providers to follow her long-term for this indication and coordinate screening.  2. Ms. Fukushima plans to discuss these results with her family and will reach out to [place] if we can be of any assistance in coordinating genetic testing for any of her relatives.Her sister and parents do not live in the area but are welcome to call with any questions.   SUPPORT AND RESOURCES: If Ms. Tomer is interested in additional information and support, there are two groups, Facing Our Risk (www.facingourrisk.com) and Bright Pink (www.brightpink.org) which some people have found useful. They provide opportunities to speak with other individuals from high-risk families. To locate genetic counselors in other cities, visit the website of the [employer] (ArtistMovie.se) and Secretary/administrator for a Social worker by zip code.  We encouraged Ms. Goette  to remain in contact with [place] on an annual basis so we can update her personal and family histories, and let her know of advances in cancer genetics that may benefit the family. Our contact number was provided. Ms. Andes questions were answered to her satisfaction today, and she knows she is welcome to call anytime with additional questions.   Faith Rogue, MS, [facility] Genetic Counselor [email or web address] Phone: [phone]

## 2020-08-03 NOTE — Telephone Encounter (Signed)
Revealed BARD1 mutation identified. Discussed this gene in detail including risks associated, family members to test next.

## 2020-08-04 ENCOUNTER — Telehealth: Payer: Self-pay | Admitting: Internal Medicine

## 2020-08-04 NOTE — Telephone Encounter (Signed)
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  I reviewed the NCCN guidelines with the patient- a slightly increased risk of breast cancer/triple negative.  However the risk not extremely high to recommend risk reduction prophylactic mastectomy unless if patient has a significant family history. [Patient does not have history of breast cancer in her mother side; however father's unknown-adopted].  Data regarding ovarian cancer risk is conflicting; however as per NCCN-no significant risk.  Also discussed regarding surveillance of contralateral breast with mammogram/MRI.   Discussed regarding port placement-as patient most likely to get chemotherapy given the triple negative nature of the disease, [however primary tumor clinically 5 mm MRI].  We will have to await final pathology-decide on chemotherapy/type of chemotherapy etc [given patient neuropathy].    After lengthy discussion patient is currently interested in unilateral mastectomy.  She would decide on contralateral breast later.  Patient in agreement to a port.  I discussed number recommendations with Dr. Peyton Najjar.  Dr. Peyton Najjar will reach out to patient also.  Patient also understands that I would typically go over complicated information like this in person rather than over the phone.  Unfortunately did not have enough time to schedule appointment impression as we got the results of the genetic testing yesterday/and patient is scheduled for surgery tomorrow.  Patient understands and in agreement.  Thanks, GB

## 2020-08-05 ENCOUNTER — Ambulatory Visit: Payer: BC Managed Care – PPO | Admitting: Certified Registered Nurse Anesthetist

## 2020-08-05 ENCOUNTER — Encounter: Payer: Self-pay | Admitting: General Surgery

## 2020-08-05 ENCOUNTER — Encounter
Admission: RE | Disposition: A | Discharge status: Home / Self Care | Payer: Self-pay | Source: Home / Self Care | Attending: General Surgery

## 2020-08-05 ENCOUNTER — Ambulatory Visit: Payer: BC Managed Care – PPO

## 2020-08-05 ENCOUNTER — Ambulatory Visit
Admission: RE | Admit: 2020-08-05 | Discharge: 2020-08-05 | Disposition: A | Discharge status: Home / Self Care | Payer: BC Managed Care – PPO | Source: Ambulatory Visit | Attending: General Surgery | Admitting: General Surgery

## 2020-08-05 ENCOUNTER — Observation Stay
Admission: RE | Admit: 2020-08-05 | Discharge: 2020-08-06 | Disposition: A | Discharge status: Home / Self Care | Payer: BC Managed Care – PPO | Attending: General Surgery | Admitting: General Surgery

## 2020-08-05 ENCOUNTER — Observation Stay: Payer: BC Managed Care – PPO

## 2020-08-05 DIAGNOSIS — C50411 Malignant neoplasm of upper-outer quadrant of right female breast: Secondary | ICD-10-CM

## 2020-08-05 DIAGNOSIS — Z171 Estrogen receptor negative status [ER-]: Secondary | ICD-10-CM

## 2020-08-05 DIAGNOSIS — Z79899 Other long term (current) drug therapy: Secondary | ICD-10-CM | POA: Diagnosis not present

## 2020-08-05 DIAGNOSIS — I10 Essential (primary) hypertension: Secondary | ICD-10-CM | POA: Diagnosis not present

## 2020-08-05 DIAGNOSIS — C50919 Malignant neoplasm of unspecified site of unspecified female breast: Secondary | ICD-10-CM

## 2020-08-05 DIAGNOSIS — Z95828 Presence of other vascular implants and grafts: Secondary | ICD-10-CM

## 2020-08-05 DIAGNOSIS — C50011 Malignant neoplasm of nipple and areola, right female breast: Secondary | ICD-10-CM | POA: Diagnosis present

## 2020-08-05 HISTORY — PX: BREAST RECONSTRUCTION WITH PLACEMENT OF TISSUE EXPANDER AND FLEX HD (ACELLULAR HYDRATED DERMIS): SHX6295

## 2020-08-05 HISTORY — DX: Malignant neoplasm of unspecified site of unspecified female breast: C50.919

## 2020-08-05 HISTORY — PX: MASTECTOMY: SHX3

## 2020-08-05 HISTORY — PX: PORTACATH PLACEMENT: SHX2246

## 2020-08-05 HISTORY — PX: TOTAL MASTECTOMY: SHX6129

## 2020-08-05 LAB — POCT PREGNANCY, URINE: Preg Test, Ur: NEGATIVE

## 2020-08-05 SURGERY — MASTECTOMY, SIMPLE
Anesthesia: General | Site: Chest | Laterality: Right

## 2020-08-05 MED ORDER — TECHNETIUM TC 99M TILMANOCEPT KIT
1.0820 | PACK | Freq: Once | INTRAVENOUS | Status: AC | PRN
  Administered 2020-08-05: 1.082 via INTRADERMAL

## 2020-08-05 MED ORDER — ASCORBIC ACID 500 MG PO TABS
1000.0000 mg | ORAL_TABLET | Freq: Every day | ORAL | Status: DC
  Administered 2020-08-06: 1000 mg via ORAL
  Filled 2020-08-05: qty 2

## 2020-08-05 MED ORDER — FAMOTIDINE 20 MG PO TABS
20.0000 mg | ORAL_TABLET | Freq: Once | ORAL | Status: AC
  Administered 2020-08-05: 20 mg via ORAL

## 2020-08-05 MED ORDER — MORPHINE SULFATE (PF) 4 MG/ML IV SOLN: 4.0000 mg | INTRAVENOUS | Status: DC | PRN

## 2020-08-05 MED ORDER — SUGAMMADEX SODIUM 200 MG/2ML IV SOLN
INTRAVENOUS | Status: DC | PRN
Start: 2020-08-05 — End: 2020-08-05
  Administered 2020-08-05: 100 mg via INTRAVENOUS

## 2020-08-05 MED ORDER — DULOXETINE HCL 20 MG PO CPEP
40.0000 mg | ORAL_CAPSULE | Freq: Every day | ORAL | Status: DC
  Administered 2020-08-05: 40 mg via ORAL
  Filled 2020-08-05 (×2): qty 2

## 2020-08-05 MED ORDER — ROCURONIUM BROMIDE 10 MG/ML (PF) SYRINGE
PREFILLED_SYRINGE | INTRAVENOUS | Status: AC
  Filled 2020-08-05: qty 10

## 2020-08-05 MED ORDER — FAMOTIDINE 20 MG PO TABS
ORAL_TABLET | ORAL | Status: AC
  Filled 2020-08-05: qty 1

## 2020-08-05 MED ORDER — SCOPOLAMINE 1 MG/3DAYS TD PT72
MEDICATED_PATCH | TRANSDERMAL | Status: AC
  Filled 2020-08-05: qty 1

## 2020-08-05 MED ORDER — PROMETHAZINE HCL 25 MG/ML IJ SOLN: 6.2500 mg | INTRAMUSCULAR | Status: DC | PRN

## 2020-08-05 MED ORDER — ROCURONIUM BROMIDE 100 MG/10ML IV SOLN
INTRAVENOUS | Status: DC | PRN
  Administered 2020-08-05: 35 mg via INTRAVENOUS
  Administered 2020-08-05: 20 mg via INTRAVENOUS
  Administered 2020-08-05: 15 mg via INTRAVENOUS

## 2020-08-05 MED ORDER — ACETAMINOPHEN 10 MG/ML IV SOLN
INTRAVENOUS | Status: DC | PRN
Start: 2020-08-05 — End: 2020-08-05
  Administered 2020-08-05: 1000 mg via INTRAVENOUS

## 2020-08-05 MED ORDER — DEXAMETHASONE SODIUM PHOSPHATE 10 MG/ML IJ SOLN
INTRAMUSCULAR | Status: AC
  Filled 2020-08-05: qty 1

## 2020-08-05 MED ORDER — FENTANYL CITRATE (PF) 100 MCG/2ML IJ SOLN
25.0000 ug | INTRAMUSCULAR | Status: DC | PRN
  Administered 2020-08-05 (×3): 25 ug via INTRAVENOUS

## 2020-08-05 MED ORDER — RISAQUAD PO CAPS
1.0000 | ORAL_CAPSULE | Freq: Every day | ORAL | Status: DC
  Administered 2020-08-06: 1 via ORAL
  Filled 2020-08-05: qty 1

## 2020-08-05 MED ORDER — CHLORHEXIDINE GLUCONATE 0.12 % MT SOLN
OROMUCOSAL | Status: AC
  Filled 2020-08-05: qty 15

## 2020-08-05 MED ORDER — FENTANYL CITRATE (PF) 100 MCG/2ML IJ SOLN
INTRAMUSCULAR | Status: AC
  Filled 2020-08-05: qty 2

## 2020-08-05 MED ORDER — CEFAZOLIN SODIUM-DEXTROSE 2-4 GM/100ML-% IV SOLN
INTRAVENOUS | Status: AC
  Filled 2020-08-05: qty 100

## 2020-08-05 MED ORDER — CEFAZOLIN SODIUM-DEXTROSE 2-4 GM/100ML-% IV SOLN
2.0000 g | INTRAVENOUS | Status: AC
  Administered 2020-08-05: 2 g via INTRAVENOUS

## 2020-08-05 MED ORDER — SODIUM CHLORIDE (PF) 0.9 % IJ SOLN
INTRAMUSCULAR | Status: AC
  Filled 2020-08-05: qty 50

## 2020-08-05 MED ORDER — LIDOCAINE HCL (CARDIAC) PF 100 MG/5ML IV SOSY
PREFILLED_SYRINGE | INTRAVENOUS | Status: DC | PRN
  Administered 2020-08-05: 40 mg via INTRAVENOUS

## 2020-08-05 MED ORDER — CHLORHEXIDINE GLUCONATE CLOTH 2 % EX PADS: 6.0000 | MEDICATED_PAD | Freq: Once | CUTANEOUS | Status: DC

## 2020-08-05 MED ORDER — SODIUM CHLORIDE 0.9 % IV SOLN: INTRAVENOUS | Status: DC

## 2020-08-05 MED ORDER — MIDAZOLAM HCL 2 MG/2ML IJ SOLN
INTRAMUSCULAR | Status: AC
  Filled 2020-08-05: qty 2

## 2020-08-05 MED ORDER — CHLORHEXIDINE GLUCONATE CLOTH 2 % EX PADS
6.0000 | MEDICATED_PAD | Freq: Once | CUTANEOUS | Status: AC
  Administered 2020-08-05: 6 via TOPICAL

## 2020-08-05 MED ORDER — HEPARIN SODIUM (PORCINE) 5000 UNIT/ML IJ SOLN
INTRAMUSCULAR | Status: AC
  Filled 2020-08-05: qty 1

## 2020-08-05 MED ORDER — ACETAMINOPHEN 10 MG/ML IV SOLN
INTRAVENOUS | Status: AC
  Filled 2020-08-05: qty 100

## 2020-08-05 MED ORDER — PHENYLEPHRINE HCL (PRESSORS) 10 MG/ML IV SOLN
INTRAVENOUS | Status: DC | PRN
  Administered 2020-08-05 (×4): 200 ug via INTRAVENOUS
  Administered 2020-08-05 (×2): 100 ug via INTRAVENOUS
  Administered 2020-08-05: 200 ug via INTRAVENOUS
  Administered 2020-08-05: 100 ug via INTRAVENOUS
  Administered 2020-08-05: 200 ug via INTRAVENOUS

## 2020-08-05 MED ORDER — LISINOPRIL 10 MG PO TABS
5.0000 mg | ORAL_TABLET | Freq: Every evening | ORAL | Status: DC
  Administered 2020-08-05: 5 mg via ORAL
  Filled 2020-08-05: qty 1

## 2020-08-05 MED ORDER — KETOROLAC TROMETHAMINE 30 MG/ML IJ SOLN: 30.0000 mg | Freq: Once | INTRAMUSCULAR | Status: DC | PRN

## 2020-08-05 MED ORDER — ORAL CARE MOUTH RINSE: 15.0000 mL | Freq: Once | OROMUCOSAL | Status: AC

## 2020-08-05 MED ORDER — THROMBIN 5000 UNITS EX SOLR
CUTANEOUS | Status: DC | PRN
  Administered 2020-08-05: 5000 [IU] via TOPICAL

## 2020-08-05 MED ORDER — EPHEDRINE SULFATE 50 MG/ML IJ SOLN
INTRAMUSCULAR | Status: DC | PRN
  Administered 2020-08-05: 10 mg via INTRAVENOUS

## 2020-08-05 MED ORDER — HYDROCODONE-ACETAMINOPHEN 7.5-325 MG PO TABS
1.0000 | ORAL_TABLET | Freq: Once | ORAL | Status: AC | PRN
  Administered 2020-08-05: 1 via ORAL

## 2020-08-05 MED ORDER — GENTAMICIN SULFATE 40 MG/ML IJ SOLN
INTRAMUSCULAR | Status: AC
  Filled 2020-08-05: qty 2

## 2020-08-05 MED ORDER — BUPIVACAINE-EPINEPHRINE (PF) 0.25% -1:200000 IJ SOLN
INTRAMUSCULAR | Status: DC | PRN
  Administered 2020-08-05: 20 mL via PERINEURAL

## 2020-08-05 MED ORDER — BUPIVACAINE LIPOSOME 1.3 % IJ SUSP
INTRAMUSCULAR | Status: AC
  Filled 2020-08-05: qty 20

## 2020-08-05 MED ORDER — THROMBIN 5000 UNITS EX SOLR
CUTANEOUS | Status: AC
  Filled 2020-08-05: qty 5000

## 2020-08-05 MED ORDER — LIDOCAINE HCL (PF) 2 % IJ SOLN
INTRAMUSCULAR | Status: AC
  Filled 2020-08-05: qty 5

## 2020-08-05 MED ORDER — IBUPROFEN 400 MG PO TABS
600.0000 mg | ORAL_TABLET | Freq: Four times a day (QID) | ORAL | Status: DC | PRN
  Administered 2020-08-06: 600 mg via ORAL
  Filled 2020-08-05: qty 2

## 2020-08-05 MED ORDER — PROPOFOL 10 MG/ML IV BOLUS
INTRAVENOUS | Status: DC | PRN
  Administered 2020-08-05: 90 mg via INTRAVENOUS

## 2020-08-05 MED ORDER — FENTANYL CITRATE (PF) 100 MCG/2ML IJ SOLN
INTRAMUSCULAR | Status: AC
  Administered 2020-08-05: 25 ug via INTRAVENOUS
  Filled 2020-08-05: qty 2

## 2020-08-05 MED ORDER — EPHEDRINE 5 MG/ML INJ
INTRAVENOUS | Status: AC
  Filled 2020-08-05: qty 10

## 2020-08-05 MED ORDER — BUPIVACAINE-EPINEPHRINE (PF) 0.25% -1:200000 IJ SOLN
INTRAMUSCULAR | Status: AC
  Filled 2020-08-05: qty 30

## 2020-08-05 MED ORDER — FENTANYL CITRATE (PF) 250 MCG/5ML IJ SOLN
INTRAMUSCULAR | Status: DC | PRN
  Administered 2020-08-05 (×4): 25 ug via INTRAVENOUS

## 2020-08-05 MED ORDER — DROPERIDOL 2.5 MG/ML IJ SOLN
0.6250 mg | Freq: Once | INTRAMUSCULAR | Status: DC | PRN
  Filled 2020-08-05: qty 2

## 2020-08-05 MED ORDER — ACETAMINOPHEN 325 MG PO TABS: 325.0000 mg | ORAL_TABLET | ORAL | Status: DC | PRN

## 2020-08-05 MED ORDER — HYDROCODONE-ACETAMINOPHEN 7.5-325 MG PO TABS
ORAL_TABLET | ORAL | Status: AC
  Filled 2020-08-05: qty 1

## 2020-08-05 MED ORDER — ACETAMINOPHEN 160 MG/5ML PO SOLN
325.0000 mg | ORAL | Status: DC | PRN
  Filled 2020-08-05: qty 20.3

## 2020-08-05 MED ORDER — SODIUM CHLORIDE 0.9 % IV SOLN
INTRAVENOUS | Status: DC | PRN
  Administered 2020-08-05: 50 mL via INTRAMUSCULAR

## 2020-08-05 MED ORDER — PREGABALIN 50 MG PO CAPS
100.0000 mg | ORAL_CAPSULE | Freq: Two times a day (BID) | ORAL | Status: DC
  Administered 2020-08-05 – 2020-08-06 (×2): 100 mg via ORAL
  Filled 2020-08-05 (×3): qty 2

## 2020-08-05 MED ORDER — CHLORHEXIDINE GLUCONATE 0.12 % MT SOLN
15.0000 mL | Freq: Once | OROMUCOSAL | Status: AC
  Administered 2020-08-05: 15 mL via OROMUCOSAL

## 2020-08-05 MED ORDER — ONDANSETRON HCL 4 MG PO TABS: 4.0000 mg | ORAL_TABLET | Freq: Three times a day (TID) | ORAL | Status: DC | PRN

## 2020-08-05 MED ORDER — ENOXAPARIN SODIUM 40 MG/0.4ML ~~LOC~~ SOLN
40.0000 mg | SUBCUTANEOUS | Status: DC
  Administered 2020-08-06: 40 mg via SUBCUTANEOUS
  Filled 2020-08-05: qty 0.4

## 2020-08-05 MED ORDER — HYDROCODONE-ACETAMINOPHEN 5-325 MG PO TABS
1.0000 | ORAL_TABLET | Freq: Three times a day (TID) | ORAL | Status: DC | PRN
  Administered 2020-08-06: 1 via ORAL
  Filled 2020-08-05 (×2): qty 1

## 2020-08-05 MED ORDER — LACTATED RINGERS IV SOLN: INTRAVENOUS | Status: DC

## 2020-08-05 MED ORDER — AMPHETAMINE-DEXTROAMPHET ER 30 MG PO CP24
30.0000 mg | ORAL_CAPSULE | Freq: Every day | ORAL | Status: DC
  Filled 2020-08-05: qty 1

## 2020-08-05 MED ORDER — ONDANSETRON HCL 4 MG/2ML IJ SOLN
INTRAMUSCULAR | Status: DC | PRN
  Administered 2020-08-05: 4 mg via INTRAVENOUS

## 2020-08-05 MED ORDER — DIAZEPAM 2 MG PO TABS: 2.0000 mg | ORAL_TABLET | Freq: Two times a day (BID) | ORAL | Status: DC | PRN

## 2020-08-05 MED ORDER — ONDANSETRON HCL 4 MG/2ML IJ SOLN
INTRAMUSCULAR | Status: AC
  Filled 2020-08-05: qty 2

## 2020-08-05 MED ORDER — SCOPOLAMINE 1 MG/3DAYS TD PT72
1.0000 | MEDICATED_PATCH | Freq: Once | TRANSDERMAL | Status: DC
  Administered 2020-08-05: 1.5 mg via TRANSDERMAL

## 2020-08-05 MED ORDER — MIDAZOLAM HCL 2 MG/2ML IJ SOLN
INTRAMUSCULAR | Status: DC | PRN
  Administered 2020-08-05: 1 mg via INTRAVENOUS

## 2020-08-05 MED ORDER — PROPOFOL 10 MG/ML IV BOLUS
INTRAVENOUS | Status: AC
  Filled 2020-08-05: qty 20

## 2020-08-05 MED ORDER — MEPERIDINE HCL 50 MG/ML IJ SOLN: 6.2500 mg | INTRAMUSCULAR | Status: DC | PRN

## 2020-08-05 MED ORDER — DEXAMETHASONE SODIUM PHOSPHATE 10 MG/ML IJ SOLN
INTRAMUSCULAR | Status: DC | PRN
  Administered 2020-08-05: 10 mg via INTRAVENOUS

## 2020-08-05 MED ORDER — SODIUM CHLORIDE 0.9 % IV SOLN
INTRAVENOUS | Status: DC | PRN
  Administered 2020-08-05: 500 mL

## 2020-08-05 SURGICAL SUPPLY — 98 items
BAG DECANTER FOR FLEXI CONT (MISCELLANEOUS) ×10 IMPLANT
BINDER BREAST LRG (GAUZE/BANDAGES/DRESSINGS) IMPLANT
BINDER BREAST MEDIUM (GAUZE/BANDAGES/DRESSINGS) IMPLANT
BINDER BREAST XLRG (GAUZE/BANDAGES/DRESSINGS) IMPLANT
BINDER BREAST XXLRG (GAUZE/BANDAGES/DRESSINGS) IMPLANT
BIOPATCH WHT 1IN DISK W/4.0 H (GAUZE/BANDAGES/DRESSINGS) ×10 IMPLANT
BLADE BOVIE TIP EXT 4 (BLADE) ×5 IMPLANT
BLADE SURG 15 STRL LF DISP TIS (BLADE) ×3 IMPLANT
BLADE SURG 15 STRL SS (BLADE) ×2
BLADE SURG SZ11 CARB STEEL (BLADE) ×5 IMPLANT
BNDG GAUZE 4.5X4.1 6PLY STRL (MISCELLANEOUS) ×10 IMPLANT
BOOT SUTURE AID YELLOW STND (SUTURE) ×5 IMPLANT
BULB RESERV EVAC DRAIN JP 100C (MISCELLANEOUS) ×22 IMPLANT
CANISTER SUCT 1200ML W/VALVE (MISCELLANEOUS) ×10 IMPLANT
CHLORAPREP W/TINT 26 (MISCELLANEOUS) ×9 IMPLANT
COVER LIGHT HANDLE STERIS (MISCELLANEOUS) ×10 IMPLANT
COVER WAND RF STERILE (DRAPES) ×5 IMPLANT
DECANTER SPIKE VIAL GLASS SM (MISCELLANEOUS) ×2 IMPLANT
DERMABOND ADVANCED (GAUZE/BANDAGES/DRESSINGS) ×6
DERMABOND ADVANCED .7 DNX12 (GAUZE/BANDAGES/DRESSINGS) ×9 IMPLANT
DRAIN CHANNEL JP 15F RND 16 (MISCELLANEOUS) ×8 IMPLANT
DRAIN CHANNEL JP 19F (MISCELLANEOUS) ×10 IMPLANT
DRAPE C-ARM XRAY 36X54 (DRAPES) ×5 IMPLANT
DRAPE LAPAROTOMY TRNSV 106X77 (MISCELLANEOUS) ×5 IMPLANT
DRSG GAUZE FLUFF 36X18 (GAUZE/BANDAGES/DRESSINGS) ×5 IMPLANT
ELECT CAUTERY BLADE 6.4 (BLADE) ×5 IMPLANT
ELECT CAUTERY BLADE TIP 2.5 (TIP) ×5
ELECT REM PT RETURN 9FT ADLT (ELECTROSURGICAL) ×5
ELECTRODE CAUTERY BLDE TIP 2.5 (TIP) ×3 IMPLANT
ELECTRODE REM PT RTRN 9FT ADLT (ELECTROSURGICAL) ×3 IMPLANT
GAUZE SPONGE 4X4 12PLY STRL (GAUZE/BANDAGES/DRESSINGS) ×5 IMPLANT
GLOVE BIO SURGEON STRL SZ 6.5 (GLOVE) ×20 IMPLANT
GLOVE BIO SURGEONS STRL SZ 6.5 (GLOVE) ×5
GLOVE BIOGEL PI IND STRL 6.5 (GLOVE) ×3 IMPLANT
GLOVE BIOGEL PI INDICATOR 6.5 (GLOVE) ×2
GOWN STRL REUS W/ TWL LRG LVL3 (GOWN DISPOSABLE) ×21 IMPLANT
GOWN STRL REUS W/TWL LRG LVL3 (GOWN DISPOSABLE) ×14
GRAFT FLEX HD 6X16 PLIABLE (Tissue) ×2 IMPLANT
HANDLE YANKAUER SUCT BULB TIP (MISCELLANEOUS) ×2 IMPLANT
IMPL BREAST XPD TISS SUT 350 (Breast) IMPLANT
IMPL BRST XPD TISS SUT 350CC (Breast) ×3 IMPLANT
IMPLANT BREAST 350CC (Breast) ×2 IMPLANT
IV NS 1000ML (IV SOLUTION) ×2
IV NS 1000ML BAXH (IV SOLUTION) IMPLANT
IV NS 500ML (IV SOLUTION) ×4
IV NS 500ML BAXH (IV SOLUTION) ×3 IMPLANT
KIT MARKER MARGIN INK (KITS) ×2 IMPLANT
KIT PORT POWER 8FR ISP CVUE (Port) ×5 IMPLANT
KIT TURNOVER KIT A (KITS) ×5 IMPLANT
LABEL OR SOLS (LABEL) ×5 IMPLANT
LIGHT WAVEGUIDE WIDE FLAT (MISCELLANEOUS) ×2 IMPLANT
MANIFOLD NEPTUNE II (INSTRUMENTS) ×10 IMPLANT
MARGIN MAP 10MM (MISCELLANEOUS) ×2 IMPLANT
NDL FILTER BLUNT 18X1 1/2 (NEEDLE) ×9 IMPLANT
NDL SAFETY ECLIPSE 18X1.5 (NEEDLE) IMPLANT
NEEDLE FILTER BLUNT 18X 1/2SAF (NEEDLE) ×6
NEEDLE FILTER BLUNT 18X1 1/2 (NEEDLE) ×9 IMPLANT
NEEDLE HYPO 18GX1.5 SHARP (NEEDLE) ×2
NEEDLE HYPO 22GX1.5 SAFETY (NEEDLE) IMPLANT
PACK BASIN MAJOR ARMC (MISCELLANEOUS) ×10 IMPLANT
PACK PORT-A-CATH (MISCELLANEOUS) ×5 IMPLANT
PACK UNIVERSAL (MISCELLANEOUS) IMPLANT
PAD ABD DERMACEA PRESS 5X9 (GAUZE/BANDAGES/DRESSINGS) ×25 IMPLANT
PIN SAFETY STRL (MISCELLANEOUS) ×5 IMPLANT
SET ASEPTIC TRANSFER (MISCELLANEOUS) ×15 IMPLANT
SLEVE PROBE SENORX GAMMA FIND (MISCELLANEOUS) ×5 IMPLANT
SOL PREP PVP 2OZ (MISCELLANEOUS)
SOLUTION PREP PVP 2OZ (MISCELLANEOUS) ×6 IMPLANT
SPONGE LAP 18X18 RF (DISPOSABLE) ×15 IMPLANT
SUT ETHILON 3-0 FS-10 30 BLK (SUTURE) ×10
SUT ETHILON 4-0 (SUTURE)
SUT ETHILON 4-0 FS2 18XMFL BLK (SUTURE)
SUT MNCRL 3-0 UNDYED SH (SUTURE) ×6 IMPLANT
SUT MNCRL 4-0 (SUTURE) ×8
SUT MNCRL 4-0 27XMFL (SUTURE) ×12
SUT MNCRL AB 4-0 PS2 18 (SUTURE) ×5 IMPLANT
SUT MNCRL+ 5-0 UNDYED PC-3 (SUTURE) ×6 IMPLANT
SUT MONOCRYL 3-0 UNDYED (SUTURE) ×4
SUT MONOCRYL 5-0 (SUTURE) ×4
SUT PDS PLUS 2 (SUTURE) ×12
SUT PDS PLUS AB 2-0 CT-1 (SUTURE) ×18 IMPLANT
SUT PROLENE 2 0 FS (SUTURE) ×5 IMPLANT
SUT SILK 2 0 SH (SUTURE) IMPLANT
SUT SILK 3-0 (SUTURE) ×5 IMPLANT
SUT SILK 4 0 SH (SUTURE) ×10 IMPLANT
SUT VIC AB 2-0 SH 27 (SUTURE) ×2
SUT VIC AB 2-0 SH 27XBRD (SUTURE) ×3 IMPLANT
SUT VIC AB 3-0 SH 27 (SUTURE) ×4
SUT VIC AB 3-0 SH 27X BRD (SUTURE) ×6 IMPLANT
SUT VIC AB 3-0 SH 8-18 (SUTURE) ×5 IMPLANT
SUT VICRYL+ 3-0 144IN (SUTURE) ×5 IMPLANT
SUTURE EHLN 3-0 FS-10 30 BLK (SUTURE) ×3 IMPLANT
SUTURE ETHLN 4-0 FS2 18XMF BLK (SUTURE) IMPLANT
SUTURE MNCRL 4-0 27XMF (SUTURE) ×12 IMPLANT
SYR 10ML LL (SYRINGE) ×15 IMPLANT
SYR 3ML LL SCALE MARK (SYRINGE) ×7 IMPLANT
SYR BULB IRRIG 60ML STRL (SYRINGE) ×5 IMPLANT
TOWEL OR 17X26 4PK STRL BLUE (TOWEL DISPOSABLE) ×5 IMPLANT

## 2020-08-05 NOTE — Transfer of Care (Signed)
Immediate Anesthesia Transfer of Care Note  Patient: Beth Wells  Procedure(s) Performed: TOTAL MASTECTOMY w/ Sentinel Node (Right Breast) INSERTION PORT-A-CATH (Left Chest) RIGHT BREAST RECONSTRUCTION WITH PLACEMENT OF TISSUE EXPANDER AND FLEX HD (ACELLULAR HYDRATED DERMIS) (Right )  Patient Location: PACU  Anesthesia Type:General  Level of Consciousness: drowsy  Airway & Oxygen Therapy: Patient Spontanous Breathing and Patient connected to face mask oxygen  Post-op Assessment: Report given to RN and Post -op Vital signs reviewed and stable  Post vital signs: Reviewed and stable  Last Vitals:  Vitals Value Taken Time  BP 119/73 08/05/20 1952  Temp    Pulse 84 08/05/20 1956  Resp 14 08/05/20 1956  SpO2 100 % 08/05/20 1956  Vitals shown include unvalidated device data.  Last Pain:  Vitals:   08/05/20 1245  TempSrc: Temporal  PainSc: 0-No pain         Complications: No complications documented.

## 2020-08-05 NOTE — Interval H&P Note (Signed)
History and Physical Interval Note:  08/05/2020 11:56 AM  Beth Wells  has presented today for surgery, with the diagnosis of C50.411, Z17.1 malignant neoplasm of uypper-outer quadrant of rt breast in female, estrogen receptor negative.  The various methods of treatment have been discussed with the patient and family. After consideration of risks, benefits and other options for treatment, the patient has consented to  Procedure(s): TOTAL MASTECTOMY w/ Sentinel Node (Right) INSERTION PORT-A-CATH (N/A) RIGHT BREAST RECONSTRUCTION WITH PLACEMENT OF TISSUE EXPANDER AND FLEX HD (ACELLULAR HYDRATED DERMIS) (Right) as a surgical intervention.  The patient's history has been reviewed, patient examined, no change in status, stable for surgery.  I have reviewed the patient's chart and labs.  Questions were answered to the patient's satisfaction.     Loel Lofty Ileen Kahre

## 2020-08-05 NOTE — Anesthesia Preprocedure Evaluation (Addendum)
Anesthesia Evaluation  Patient identified by MRN, date of birth, ID band Patient awake    Reviewed: Allergy & Precautions, H&P , NPO status , reviewed documented beta blocker date and time   History of Anesthesia Complications (+) PONV and history of anesthetic complications  Airway Mallampati: II  TM Distance: >3 FB Neck ROM: full    Dental  (+) Teeth Intact   Pulmonary    Pulmonary exam normal        Cardiovascular hypertension, + Orthopnea  Normal cardiovascular exam     Neuro/Psych  Neuromuscular disease    GI/Hepatic GERD  Controlled,  Endo/Other    Renal/GU      Musculoskeletal   Abdominal   Peds  Hematology   Anesthesia Other Findings Past Medical History: 07/13/2020: Carcinoma of upper-outer quadrant of right breast in  female, estrogen receptor negative ([place]) No date: Complication of anesthesia     Comment:  hard to wake up-very dizzy feeling like she was going to pass out, likely due to Versed, will reduce from 2 to 1 mg for this procedure. No date: GERD (gastroesophageal reflux disease)     Comment:  no meds 08/2018: H/O foot surgery No date: History of kidney stones     Comment:  h/o No date: Hypertension No date: PONV (postoperative nausea and vomiting)     Comment:  n/v aftter kidney stone surgery Past Surgical History: 07/06/2020: BREAST BIOPSY; Right     Comment:  u/s bx Q clip path pending No date: BUNIONECTOMY; Right     Comment:  titanium pin No date: CERVICAL CONE BIOPSY 03/12/2018: DILATION AND EVACUATION; N/A     Comment:  Procedure: DILATATION AND EVACUATION;  Surgeon: [name], MD;  Location: [facility] ORS;  Service: Gynecology;                Laterality: N/A; No date: KIDNEY STONE SURGERY 06/07/2019: LAPAROSCOPIC OVARIAN CYSTECTOMY; Right     Comment:  Procedure: LAPAROSCOPIC OVARIAN CYSTECTOMY;  Surgeon:               [name], MD;  Location: [facility] ORS;   Service:               Gynecology;  Laterality: Right; 06/07/2019: LAPAROSCOPY; N/A     Comment:  Procedure: LAPAROSCOPY DIAGNOSTIC,PERITANEAL BIOPSIES;                Surgeon: [name], MD;  Location: [facility] ORS;                Service: Gynecology;  Laterality: N/A; BMI    Body Mass Index: 18.95 kg/m     Reproductive/Obstetrics                           Anesthesia Physical Anesthesia Plan  ASA: II  Anesthesia Plan: General   Post-op Pain Management:    Induction: Intravenous  PONV Risk Score and Plan: Ondansetron, Dexamethasone, Scopolamine patch - Pre-op, Treatment may vary due to age or medical condition and Midazolam  Airway Management Planned: Oral ETT  Additional Equipment:   Intra-op Plan:   Post-operative Plan: Extubation in OR  Informed Consent: I have reviewed the patients History and Physical, chart, labs and discussed the procedure including the risks, benefits and alternatives for the proposed anesthesia with the patient or authorized representative who has indicated his/her understanding and acceptance.     Dental  Advisory Given  Plan Discussed with: CRNA  Anesthesia Plan Comments:         Anesthesia Quick Evaluation

## 2020-08-05 NOTE — Op Note (Addendum)
Preoperative diagnosis:  Carcinoma of the right breast.  Postoperative diagnosis: Carcinoma of the right breast.   Procedure: Right nipple sparing total mastectomy.                      Sentinel Lymph node biopsy  Anesthesia: GETA  Surgeon: Dr. Windell Moment  Wound Classification: Clean  Indications: Patient is a 44 y.o. female who had an abnormal mammogram that on workup with core needle biopsy was found to be invasive mammary carcinoma. After discussion of alternatives, the patient elected total mastectomy.  Findings: 1. Viable flaps achieved 2. No palpable masses 3. Adequate hemostasis  Description of procedure: The patient was brought to the operating room and general anesthesia was induced. A time-out was completed verifying correct patient, procedure, site, positioning, and implant(s) and/or special equipment prior to beginning this procedure. The breast, chest wall, axilla, and upper arm and neck were prepped and draped in the usual sterile fashion.  A skin incision was made in the right inframammary fold. Flaps were raised in the avascular plane between subcutaneous tissue and breast tissue from the clavicle superiorly, the sternum medially, the anterior rectus sheath inferiorly, and past the lateral border of the pectoralis major muscle laterally. Hemostasis was achieved in the flaps. Next, the breast tissue and underlying pectoralis fascia were excised from the pectoralis major muscle, progressing from medially to laterally. At the lateral border of the pectoralis major muscle, the breast tissue was swung laterally and a lateral pedicle identified where breast tissue gave way to fat of axilla. The lateral pedicle was incised and the specimen removed.  The wound was irrigated and hemostasis was achieved.  A hand-held gamma probe was used to identify the location of the hottest spot in the axilla. Dissection was carried down until subdermal facias was advanced. The probe was placed and  again, the point of maximal count was found. Dissection continue until nodule was identified. The probe was placed in contact with the node. The node was excised in its entirety.  An additional hot spot was detected and the node was excised in similar fashion. The procedure was terminated. Hemostasis was achieved and the wound was left open for immediate reconstruction by Plastic surgery.   Sentinel Node Biopsy Synoptic Operative Report  Operation performed with curative intent:No  Tracer(s) used to identify sentinel nodes in the upfront surgery (non-neoadjuvant) setting (select all that apply):Radioactive Tracer  Tracer(s) used to identify sentinel nodes in the neoadjuvant setting (select all that apply):N/A  All nodes (colored or non-colored) present at the end of a dye-filled lymphatic channel were removed: N/A  All significantly radioactive nodes were removed:Yes  All palpable suspicious nodes were removed:N/A  Biopsy-proven positive nodes marked with clips prior to chemotherapy were identified and removed:N/A  Specimen: Right Breast                    Sentinel lymph nodes #1, #2.   Complications: None  Estimated Blood Loss: 50 mL

## 2020-08-05 NOTE — Discharge Instructions (Signed)
INSTRUCTIONS FOR AFTER SURGERY   You will likely have some questions about what to expect following your operation.  The following information will help you and your family understand what to expect when you are discharged from the hospital.  Following these guidelines will help ensure a smooth recovery and reduce risks of complications.  Postoperative instructions include information on: diet, wound care, medications and physical activity.  AFTER SURGERY Expect to go home after the procedure.  In some cases, you may need to spend one night in the hospital for observation.  DIET This surgery does not require a specific diet.  However, I have to mention that the healthier you eat the better your body can start healing. It is important to increasing your protein intake.  This means limiting the foods with added sugar.  Focus on fruits and vegetables and some meat. It is very important to drink water after your surgery.  If your urine is bright yellow, then it is concentrated, and you need to drink more water.  As a general rule after surgery, you should have 8 ounces of water every hour while awake.  If you find you are persistently nauseated or unable to take in liquids let us know.  NO TOBACCO USE or EXPOSURE.  This will slow your healing process and increase the risk of a wound.  WOUND CARE If you have a drain: Clean with baby wipes for three days.   If you have steri-strips / tape directly attached to your skin leave them in place. It is OK to get these wet.  No baths, pools or hot tubs for two weeks. We close your incision to leave the smallest and best-looking scar. No ointment or creams on your incisions until given the go ahead.  Especially not Neosporin (Too many skin reactions with this one).  A few weeks after surgery you can use Mederma and start massaging the scar. We ask you to wear your binder or sports bra for the first 6 weeks around the clock, including while sleeping. This provides  added comfort and helps reduce the fluid accumulation at the surgery site.  ACTIVITY No heavy lifting until cleared by the doctor.  It is OK to walk and climb stairs. In fact, moving your legs is very important to decrease your risk of a blood clot.  It will also help keep you from getting deconditioned.  Every 1 to 2 hours get up and walk for 5 minutes. This will help with a quicker recovery back to normal.  Let pain be your guide so you don't do too much.  NO, you cannot do the spring cleaning and don't plan on taking care of anyone else.  This is your time for TLC.   WORK Everyone returns to work at different times. As a rough guide, most people take at least 1 - 2 weeks off prior to returning to work. If you need documentation for your job, bring the forms to your postoperative follow up visit.  DRIVING Arrange for someone to bring you home from the hospital.  You may be able to drive a few days after surgery but not while taking any narcotics or valium.  BOWEL MOVEMENTS Constipation can occur after anesthesia and while taking pain medication.  It is important to stay ahead for your comfort.  We recommend taking Milk of Magnesia (2 tablespoons; twice a day) while taking the pain pills.  SEROMA This is fluid your body tried to put in the surgical site.    This is normal but if it creates excessive pain and swelling let us know.  It usually decreases in a few weeks.  MEDICATIONS and PAIN CONTROL At your preoperative visit for you history and physical you were given the following medications: 1. An antibiotic: Start this medication when you get home and take according to the instructions on the bottle. 2. Zofran 4 mg:  This is to treat nausea and vomiting.  You can take this every 6 hours as needed and only if needed. 3. Norco (hydrocodone/acetaminophen) 5/325 mg:  This is only to be used after you have taken the motrin or the tylenol. Every 8 hours as needed. Over the counter Medication to  take: 4. Ibuprofen (Motrin) 600 mg:  Take this every 6 hours.  If you have additional pain then take 500 mg of the tylenol.  Only take the Norco after you have tried these two. 5. Miralax or stool softener of choice: Take this according to the bottle if you take the Norco.  WHEN TO CALL Call your surgeon's office if any of the following occur: . Fever 101 degrees F or greater . Excessive bleeding or fluid from the incision site. . Pain that increases over time without aid from the medications . Redness, warmth, or pus draining from incision sites . Persistent nausea or inability to take in liquids . Severe misshapen area that underwent the operation.  [facility]  What is the benefit of having a drain?  During surgery your tissue layers are separated.  This raw surface stimulates your body to fill the space with serous fluid.  This is normal but you don't want that fluid to collect and prevent healing.  A fluid collection can also become infected.  The Jackson-Pratt (JP) drain is used to eliminate this collection of fluid and allow the tissue to heal together.    Jackson-Pratt (JP) bulb    How to care for your drainage and suction unit at home Your drainage catheter will be connected to a collection device. The vacuum caused when the device is compressed allows drainage to collect in the device.    . Wash your hands with soap and water before and after touching the system. . Empty the JP drain every 12 hours once you get home from your procedure. . Record the fluid amount on the record sheet included. . Start with stripping the drain tube to push the clots or excess fluid to the bulb.  Do this by pinching the tube with one hand near your skin.  Then with the other hand squeeze the tubing and work it toward the bulb.  This should be done several times a day.  This may collapse the tube which will correct on its own.   . Use a safety pin to attach your collection device  to your clothing so there is no tension on the insertion site.   . If you have drainage at the skin insertion site, you can apply a gauze dressing and secure it with tape. . If the drain falls out, apply a gauze dressing over the drain insertion site and secure with tape.   To empty the collection device:   . Release the stopper on the top of the collection unit (bulb).  . Pour contents into a measuring container such as a plastic medicine cup.  . Record the day and amount of drainage on the attached sheet. . This should be done at least twice a day.      To compress the Jackson-Pratt Bulb:  . Release the stopper at the top of the bulb. . Squeeze the bulb tightly in your fist, squeezing air out of the bulb.  . Replace the stopper while the bulb is compressed.  . Be careful not to spill the contents when squeezing the bulb. . The drainage will start bright red and turn to pink and then yellow with time. . IMPORTANT: If the bulb is not squeezed before adding the stopper it will not draw out the fluid.  Care for the JP drain site and your skin daily:  . You may shower three days after surgery. . Secure the drain to a ribbon or cloth around your waist while showering so it does not pull out while showering. . Be sure your hands are cleaned with soap and water. . Use a clean wet cotton swab to clean the skin around the drain site.  . Use another cotton swab to place Vaseline or antibiotic ointment on the skin around the drain.     Contact your physician if any of the following occur:  . The fluid in the bulb becomes cloudy. . Your temperature is greater than 101.4.  . The incision opens. . If you have drainage at the skin insertion site, you can apply a gauze dressing and secure it with tape. . If the drain falls out, apply a gauze dressing over the drain insertion site and secure with tape.  . You will usually have more drainage when you are active than while you rest or are asleep. If the  drainage increases significantly or is bloody call the physician                             Bring this record with you to each office visit Date  Drainage Volume  Date   Drainage volume                                                                                                                                                                                          

## 2020-08-05 NOTE — Anesthesia Procedure Notes (Signed)
Procedure Name: Intubation Date/Time: 08/05/2020 3:52 PM Performed by: Eben Burow, CRNA Pre-anesthesia Checklist: Patient identified, Emergency Drugs available, Suction available and Patient being monitored Patient Re-evaluated:Patient Re-evaluated prior to induction Oxygen Delivery Method: Circle system utilized Preoxygenation: Pre-oxygenation with 100% oxygen Induction Type: IV induction Ventilation: Mask ventilation without difficulty Laryngoscope Size: Mac and 3 Grade View: Grade I Tube type: Oral Tube size: 6.5 mm Number of attempts: 1 Placement Confirmation: ETT inserted through vocal cords under direct vision,  positive ETCO2 and breath sounds checked- equal and bilateral Secured at: 20 cm Tube secured with: Tape Dental Injury: Teeth and Oropharynx as per pre-operative assessment

## 2020-08-05 NOTE — Anesthesia Postprocedure Evaluation (Signed)
Anesthesia Post Note  Patient: Beth Wells  Procedure(s) Performed: TOTAL MASTECTOMY w/ Sentinel Node (Right Breast) INSERTION PORT-A-CATH (Left Chest) RIGHT BREAST RECONSTRUCTION WITH PLACEMENT OF TISSUE EXPANDER AND FLEX HD (ACELLULAR HYDRATED DERMIS) (Right )  Patient location during evaluation: PACU Anesthesia Type: General Level of consciousness: awake and alert Pain management: pain level controlled Vital Signs Assessment: post-procedure vital signs reviewed and stable Respiratory status: spontaneous breathing, nonlabored ventilation, respiratory function stable and patient connected to nasal cannula oxygen Cardiovascular status: blood pressure returned to baseline and stable Postop Assessment: no apparent nausea or vomiting Anesthetic complications: no   No complications documented.   Last Vitals:  Vitals:   08/05/20 2053 08/05/20 2119  BP: 115/78 112/74  Pulse: 79 (!) 57  Resp: 12 17  Temp: 36.7 C 36.7 C  SpO2: 100% 99%    Last Pain:  Vitals:   08/05/20 2119  TempSrc: Oral  PainSc:                  [name]

## 2020-08-05 NOTE — Op Note (Signed)
Op report    DATE OF OPERATION:  08/05/2020  LOCATION: [facility]  SURGICAL DIVISION: Plastic Surgery  PREOPERATIVE DIAGNOSES:  1. Right Breast cancer.    POSTOPERATIVE DIAGNOSES:  1. Right Breast cancer.   PROCEDURE:  1. Right immediate breast reconstruction with placement of Acellular Dermal Matrix and tissue expanders.  SURGEON: Avyana Puffenbarger Sanger Sumayya Muha, DO  ANESTHESIA:  General.   COMPLICATIONS: None.   IMPLANTS: Right - Mentor 350 cc., 100 cc of injectable saline placed in the expander. Acellular Dermal Matrix 6 x 16 cm Flex HD  INDICATIONS FOR PROCEDURE:  The patient, Beth Wells, is a 44 y.o. female born on 06-27-1976, is here for  immediate first stage breast reconstruction with placement of a right tissue expander and Acellular dermal matrix. MRN: [id number]  CONSENT:  Informed consent was obtained directly from the patient. Risks, benefits and alternatives were fully discussed. Specific risks including but not limited to bleeding, infection, hematoma, seroma, scarring, pain, implant infection, implant extrusion, capsular contracture, asymmetry, wound healing problems, and need for further surgery were all discussed. The patient did have an ample opportunity to have her questions answered to her satisfaction.   DESCRIPTION OF PROCEDURE:  The patient was taken to the operating room by the general surgery team. SCDs were placed and IV antibiotics were given. The patient's chest was prepped and draped in a sterile fashion. A time out was performed and the implants to be used were identified.  A Right mastectomy was performed.  Once the general surgery team had completed their portion of the case the patient was rendered to the plastic and reconstructive surgery team.  Right:  The pectoralis major muscle was lifted from the chest wall through the inframammary incision with release of the lateral edge and lateral inframammary fold.  The pocket was irrigated  with antibiotic solution and hemostasis was achieved with electrocautery.  The ADM was then prepared according to the manufacture guidelines and slits placed to help with postoperative fluid management.  The ADM was then sutured to the inferior and lateral edge of the inframammary fold with 2-0 PDS starting with an interrupted stitch and then a running stitch.  The lateral portion was sutured to with interrupted sutures after the expander was placed.  Thrombin was placed as there was mild blood stain laterally that was too sensitive to the bovie at the latissimus muscle. There was not active bleeding. Marcaine 0.25% with epinephrine was placed in the pocket. The expander was prepared according to the manufacture guidelines, the air evacuated and then it was placed under the ADM and pectoralis major muscle.  The inferior and lateral tabs were used to secure the expander to the chest wall with 2-0 PDS.  The drain was placed at the inframammary fold over the ADM and secured to the skin with 3-0 Silk.    The deep layers were closed with 3-0 Monocryl followed by 4-0 Monocryl.  The skin was closed with 5-0 Monocryl and then dermabond was applied.  The ABDs and breast binder were placed.  The patient tolerated the procedure well and there were no complications.  The patient was allowed to wake from anesthesia and taken to the recovery room in satisfactory condition.

## 2020-08-05 NOTE — Interval H&P Note (Signed)
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  The various methods of treatment have been discussed with the patient and family. After consideration of risks, benefits and other options for treatment, the patient has consented to  Procedure(s): TOTAL MASTECTOMY w/ Sentinel Node (Right) INSERTION PORT-A-CATH (N/A) RIGHT BREAST RECONSTRUCTION WITH PLACEMENT OF TISSUE EXPANDER AND FLEX HD (ACELLULAR HYDRATED DERMIS) (Right) as a surgical intervention.  The patient's history has been reviewed, patient examined, no change in status, stable for surgery.  I have reviewed the patient's chart and labs.  Questions were answered to the patient's satisfaction.     Herbert Pun

## 2020-08-06 ENCOUNTER — Other Ambulatory Visit: Payer: Self-pay

## 2020-08-06 ENCOUNTER — Encounter: Payer: Self-pay | Admitting: General Surgery

## 2020-08-06 DIAGNOSIS — C50411 Malignant neoplasm of upper-outer quadrant of right female breast: Secondary | ICD-10-CM | POA: Diagnosis not present

## 2020-08-06 MED ORDER — HYDROCODONE-ACETAMINOPHEN 5-325 MG PO TABS: 1.0000 | ORAL_TABLET | Freq: Three times a day (TID) | ORAL | 0 refills | Status: AC | PRN

## 2020-08-06 NOTE — Op Note (Signed)
SURGICAL PROCEDURE REPORT  DATE OF PROCEDURE: 08/06/2020   SURGEON: Dr. 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  PROCEDURE(S): (cpt: 36561) 1.) Percutaneous access of Left internal jugular vein under ultrasound guidance 2.) Insertion of tunneled Left internal jugular central venous catheter with subcutaneous port  INTRAOPERATIVE FINDINGS: Patent easily compressible Left internal jugular vein with appropriate respiratory variations and well-secured tunneled central venous catheter with subcutaneous port at completion of the procedure  ESTIMATED BLOOD LOSS: Minimal (<20 mL)   SPECIMENS: None   IMPLANTS: 37F tunneled Bard PowerPort central venous catheter with subcutaneous port  DRAINS: None   COMPLICATIONS: None apparent   CONDITION AT COMPLETION: Hemodynamically stable, awake   DISPOSITION: PACU   INDICATION(S) FOR PROCEDURE:  Patient is a 44 y.o. female who presented with right breast triple negative cancer requiring durable central venous access for chemotherapy. All risks, benefits, and alternatives to above elective procedures were discussed with the patient, who elected to proceed, and informed consent was accordingly obtained at that time.  DETAILS OF PROCEDURE:  Patient was brought to the operative suite and appropriately identified. In Trendelenburg position, Left IJ venous access site was prepped and draped in the usual sterile fashion, and following a brief timeout, percutaneous Left IJ venous access was obtained under ultrasound guidance using Seldinger technique, by which local anesthetic was injected over the Left IJ vein, and access needle was inserted under direct ultrasound visualization into  the Left IJ vein, through which soft guidewire was advanced, over which access needle was withdrawn. Guidewire was secured, attention was directed to injection of local anesthetic along the planned tunnel site, 2-3 cm transverse Left chest incision was made and confirmed to accommodate the subcutaneous port, and flushed catheter was tunneled retrograde from the port site over the Left chest to the Left IJ access site with the attached port well-secured to the catheter and within the subcutaneous pocket. Insertion sheath was advanced over the guidewire, which was withdrawn along with the insertion sheath dilator. The catheter was introduced through the sheath and left on the [place]rio [place] junction under fluoro guidance and catheter cut to desire lenght. Catheter connected to port and fixed to the pocket on two side to avoid twisting. Port was confirmed to withdraw blood and flush easily, after which concentrated heparin was instilled into the port and catheter. Dermis at the subcutaneous pocket was re-approximated using buried interrupted 3-0 Vicryl suture, and 4-0 Monocryl suture was used to re-approximate skin at the insertion and subcutaneous port sites in running subcuticular fashion for the subcutaneous port and buried interrupted fashion for the insertion site. Skin was cleaned, dried, and sterile skin glue was applied. Patient was then safely transferred to PACU for a chest x-ray. Ultrasound images are available on paper chart and Fluoroscopy guidance images are available in Epic.

## 2020-08-06 NOTE — Plan of Care (Signed)
Continuing with plan of care. 

## 2020-08-06 NOTE — Plan of Care (Signed)
Discharge teaching completed with patient including JP teaching, patient is in stable condition.

## 2020-08-06 NOTE — Discharge Summary (Signed)
Patient ID: Beth Wells MRN: [id number] DOB/AGE: 12-03-1975 44 y.o.  Admit date: 08/05/2020 Discharge date: 08/06/2020   Discharge Diagnoses:  Active Problems:   Breast cancer [facility])   Procedures: Right nipple sparing total mastectomy with sentinel node biopsy and immediate reconstruction  Hospital Course: Patient tolerated procedure well.  This morning patient with pain control.  The skin flaps are viable.  There is no fluid collection.  There is no sign of ischemia.  Physical Exam Cardiovascular:     Rate and Rhythm: Normal rate and regular rhythm.     Pulses: Normal pulses.  Pulmonary:     Effort: Pulmonary effort is normal.  Abdominal:     General: Abdomen is flat.  Neurological:     Mental Status: She is alert.   Breast wound is dry and clean.  Skin flaps with adequate capillary refill.  No sign of ischemia.   Consults: None  Disposition: Discharge disposition: 01-Home or Self Care       Discharge Instructions    Diet - low sodium heart healthy   Complete by: As directed    Increase activity slowly   Complete by: As directed      Allergies as of 08/06/2020   No Known Allergies     Medication List    TAKE these medications   acetaminophen 500 MG tablet Commonly known as: TYLENOL Take 1 tablet (500 mg total) by mouth every 6 (six) hours as needed. For use AFTER surgery   ALPHA-LIPOIC ACID PO Take 500 mg by mouth daily.   amphetamine-dextroamphetamine 30 MG 24 hr capsule Commonly known as: ADDERALL XR Take 30 mg by mouth daily.   diazepam 2 MG tablet Commonly known as: Valium Take 1 tablet (2 mg total) by mouth every 12 (twelve) hours as needed for muscle spasms.   DULoxetine 20 MG capsule Commonly known as: CYMBALTA Take 2 capsules (40 mg total) by mouth at bedtime.   HYDROcodone-acetaminophen 5-325 MG tablet Commonly known as: Norco Take 1 tablet by mouth every 8 (eight) hours as needed for up to 7 days for severe pain. For use AFTER  Surgery   ibuprofen 200 MG tablet Commonly known as: ADVIL Take 400 mg by mouth every 6 (six) hours as needed for headache or moderate pain.   ibuprofen 600 MG tablet Commonly known as: ADVIL Take 1 tablet (600 mg total) by mouth every 6 (six) hours as needed for mild pain or moderate pain. For use AFTER surgery   lisinopril 5 MG tablet Commonly known as: ZESTRIL Take 5 mg by mouth every evening.   ondansetron 4 MG tablet Commonly known as: Zofran Take 1 tablet (4 mg total) by mouth every 8 (eight) hours as needed for nausea or vomiting.   pregabalin 100 MG capsule Commonly known as: Lyrica Take 1 capsule (100 mg total) by mouth 2 (two) times daily.   PROBIOTIC ADVANCED PO Take 1 capsule by mouth daily.   trolamine salicylate 10 % cream Commonly known as: ASPERCREME Apply 1 application topically as needed for muscle pain.   vitamin C 1000 MG tablet Take 1,000 mg by mouth daily.       Follow-up Information    Herbert Pun, MD In 2 weeks.   Specialty: General Surgery Contact information: [place] [id number] [phone]        Wallace Going, DO In 1 week.   Specialty: Plastic Surgery Contact information: [place] [address] [phone]

## 2020-08-09 ENCOUNTER — Encounter: Payer: Self-pay | Admitting: Student in an Organized Health Care Education/Training Program

## 2020-08-09 ENCOUNTER — Other Ambulatory Visit: Payer: Self-pay | Admitting: Anatomic Pathology & Clinical Pathology

## 2020-08-09 ENCOUNTER — Encounter: Payer: Self-pay | Admitting: *Deleted

## 2020-08-09 LAB — SURGICAL PATHOLOGY

## 2020-08-09 NOTE — Progress Notes (Signed)
Called patient to follow up post surgery.  States she is doing well.  She is still emptying about 100 cc's of fluid per day.  States she is noticing a little swelling in the "armpit" above the dressing.  She has a follow Friday with Dr. Marla Roe, but does not have a follow up appointment with Dr. Peyton Najjar.  She is going to call his office.  Encouraged patient to call either surgeons office if the axilla swelling gets any worse.  She is agreeable.

## 2020-08-09 NOTE — Addendum Note (Signed)
Addended by: Allena Napoleon on: 08/09/2020 04:44 PM   Modules accepted: Orders

## 2020-08-10 ENCOUNTER — Other Ambulatory Visit: Payer: Self-pay

## 2020-08-10 ENCOUNTER — Ambulatory Visit
Payer: BC Managed Care – PPO | Attending: Student in an Organized Health Care Education/Training Program | Admitting: Student in an Organized Health Care Education/Training Program

## 2020-08-10 ENCOUNTER — Encounter: Payer: Self-pay | Admitting: Student in an Organized Health Care Education/Training Program

## 2020-08-10 DIAGNOSIS — Z171 Estrogen receptor negative status [ER-]: Secondary | ICD-10-CM

## 2020-08-10 DIAGNOSIS — G608 Other hereditary and idiopathic neuropathies: Secondary | ICD-10-CM

## 2020-08-10 DIAGNOSIS — R202 Paresthesia of skin: Secondary | ICD-10-CM

## 2020-08-10 DIAGNOSIS — G629 Polyneuropathy, unspecified: Secondary | ICD-10-CM

## 2020-08-10 DIAGNOSIS — C50411 Malignant neoplasm of upper-outer quadrant of right female breast: Secondary | ICD-10-CM

## 2020-08-10 DIAGNOSIS — G894 Chronic pain syndrome: Secondary | ICD-10-CM

## 2020-08-10 DIAGNOSIS — G603 Idiopathic progressive neuropathy: Secondary | ICD-10-CM

## 2020-08-10 MED ORDER — PREGABALIN 100 MG PO CAPS: 100.0000 mg | ORAL_CAPSULE | Freq: Two times a day (BID) | ORAL | 1 refills | Status: DC

## 2020-08-10 NOTE — Progress Notes (Signed)
Patient: Beth Wells  Service Category: E/M  Provider: Gillis Santa, MD  DOB: 05-25-1976  DOS: 08/10/2020  Location: Office  MRN: [id number]  Setting: Ambulatory outpatient  Referring Provider: [name]  Type: Established Patient  Specialty: Interventional Pain Management  PCP: [name]  Location: Home  Delivery: TeleHealth     Virtual Encounter - Pain Management PROVIDER NOTE: Information contained herein reflects review and annotations entered in association with encounter. Interpretation of such information and data should be left to medically-trained personnel. Information provided to patient can be located elsewhere in the medical record under "Patient Instructions". Document created using STT-dictation technology, any transcriptional errors that may result from process are unintentional.    Contact & Pharmacy Preferred: [phone] Home: [phone] (home) Mobile: [phone] (mobile) E-mail: [email or web address]  [id number] [place] [address]Phone: [phone]: [phone]  Pre-screening  Ms. Helbling offered "in-person" vs "virtual" encounter. She indicated preferring virtual for this encounter.   Reason COVID-19*  Social distancing based on CDC and AMA recommendations.   I contacted KMiliani Deikeon 08/10/2020 via video conference.      I clearly identified myself as BGillis Santa MD. I verified that I was speaking with the correct person using two identifiers (Name: KLaquinta Hazell and date of birth: 7May 14, 1977.  Consent I sought verbal advanced consent from KBabette Relicfor virtual visit interactions. I informed Ms. Doughtie of possible security and privacy concerns, risks, and limitations associated with providing "not-in-person" medical evaluation and management services. I also informed Ms. Shreeve of the availability of "in-person" appointments. Finally, I informed her that there would be  a charge for the virtual visit and that she could be  personally, fully or partially, financially responsible for it. Ms. LYadaoexpressed understanding and agreed to proceed.   Historic Elements   Ms. KSharyl Panchalis a 44y.o. year old, female patient evaluated today after our last contact on 05/13/2020. Ms. LTuohy has a past medical history of Breast cancer ([place] (08/05/2020), Carcinoma of upper-outer quadrant of right breast in female, estrogen receptor negative ([place] (120/35/5974, Complication of anesthesia, GERD (gastroesophageal reflux disease), H/O foot surgery (08/2018), History of kidney stones, Hypertension, and PONV (postoperative nausea and vomiting). She also  has a past surgical history that includes Kidney stone surgery; Cervical cone biopsy; Dilation and evacuation (N/A, 03/12/2018); laparoscopy (N/A, 06/07/2019); Laparoscopic ovarian cystectomy (Right, 06/07/2019); Breast biopsy (Right, 07/06/2020); Bunionectomy (Right); Total mastectomy (Right, 08/05/2020); Portacath placement (Left, 08/05/2020); and Breast reconstruction with placement of tissue expander and flex hd (acellular hydrated dermis) (Right, 08/05/2020). Ms. LWilliamsonhas a current medication list which includes the following prescription(s): acetaminophen, alpha-lipoic acid, amphetamine-dextroamphetamine, diazepam, duloxetine, hydrocodone-acetaminophen, ibuprofen, ibuprofen, lisinopril, ondansetron, pregabalin, probiotic product, and trolamine salicylate. She  reports that she has never smoked. She has never used smokeless tobacco. She reports previous alcohol use. She reports that she does not use drugs. Ms. LHowkhas No Known Allergies.   HPI  Today, she is being contacted for medication management.  Patient is status post total mastectomy with right breast reconstruction and placement of tissue expander for right breast cancer.  She states that she is doing well from a postoperative pain standpoint. There may be need to have  chemotherapy in the future. Patient states that she is experiencing sexual dysfunction as a result of her Cymbalta. She states that she has no libido/sexual desire. I instructed her to reduce her Cymbalta to  20 mg and see if that has an impact on her side effects. In the interim, I recommended that she restart her alpha lipoic acid at 500 mg daily and increase to 1000 mg in 2 weeks if no side effects. This can be helpful for neuropathic pain. Otherwise I will refill her Lyrica as below, 100 mg twice a day.   Laboratory Chemistry Profile   Renal Lab Results  Component Value Date   BUN 16 06/07/2019   CREATININE 0.84 06/07/2019   GFRAA >60 06/07/2019   GFRNONAA >60 06/07/2019     Hepatic Lab Results  Component Value Date   AST 21 05/30/2019   ALT 15 05/30/2019   ALBUMIN 4.5 05/30/2019   ALKPHOS 44 05/30/2019     Electrolytes Lab Results  Component Value Date   NA 137 06/07/2019   K 4.2 06/07/2019   CL 106 06/07/2019   CALCIUM 8.3 (L) 06/07/2019     Bone No results found for: VD25OH, VD125OH2TOT, XB2841LK4, MW1027OZ3, 25OHVITD1, 25OHVITD2, 25OHVITD3, TESTOFREE, TESTOSTERONE   Inflammation (CRP: Acute Phase) (ESR: Chronic Phase) No results found for: CRP, ESRSEDRATE, LATICACIDVEN     Note: Above Lab results reviewed.  Imaging  DG Chest 1 View CLINICAL DATA:  Port-A-Cath.  EXAM: CHEST  1 VIEW  COMPARISON:  None.  FINDINGS: There is a grossly well-positioned left-sided Port-A-Cath in place. There is no left-sided pneumothorax. A surgical drain projects over the patient's right chest wall. The lungs are essentially clear without evidence for focal infiltrate or large pleural effusion. There are pockets of subcutaneous gas along the patient's right flank favored to be postsurgical in etiology. There is no acute displaced fracture. The heart size is unremarkable.  IMPRESSION: 1. Well-positioned left-sided Port-A-Cath. 2. No pneumothorax. 3. Subcutaneous gas along  the patient's right flank favored to be postsurgical in etiology.  Electronically Signed   By: Constance Holster M.D.   On: 08/05/2020 20:14 DG C-Arm 1-60 Min-No Report Fluoroscopy was utilized by the requesting physician.  No radiographic  interpretation.  NM Sentinel Node Inj-No Rpt (Breast) Sulfur colloid was injected by the nuclear medicine technologist for  melanoma sentinel node.   Assessment  The primary encounter diagnosis was Chronic pain syndrome. Diagnoses of Small fiber neuropathy, Idiopathic progressive neuropathy, Paresthesias, Carcinoma of upper-outer quadrant of right breast in female, estrogen receptor negative ([place]), and Idiopathic small fiber sensory neuropathy were also pertinent to this visit.  Plan of Care   Ms. Zionah Criswell has a current medication list which includes the following long-term medication(s): amphetamine-dextroamphetamine, duloxetine, lisinopril, and pregabalin.  1. Refill Lyrica as below. 2. Decrease Cymbalta to 20 mg daily given side effects of sexual dysfunction.  3. Restart alpha lipoic acid at 500 mg daily. Increase to 1000 mg in 2 weeks if no side effects.  Pharmacotherapy (Medications Ordered): Meds ordered this encounter  Medications  . pregabalin (LYRICA) 100 MG capsule    Sig: Take 1 capsule (100 mg total) by mouth 2 (two) times daily.    Dispense:  180 capsule    Refill:  1    Fill one day early if pharmacy is closed on scheduled refill date. May substitute for generic if available.    Follow-up plan:   Return in about 5 months (around 01/08/2021) for Medication Management, virtual.   Recent Visits Date Type Provider Dept  05/13/20 Office Visit Gillis Santa, MD [facility]  Showing recent visits within past 90 days and meeting all other requirements Today's Visits Date Type Provider Dept  08/10/20 Telemedicine Gillis Santa, MD [facility]  Showing today's visits and meeting all other requirements Future  Appointments No visits were found meeting these conditions. Showing future appointments within next 90 days and meeting all other requirements  I discussed the assessment and treatment plan with the patient. The patient was provided an opportunity to ask questions and all were answered. The patient agreed with the plan and demonstrated an understanding of the instructions.  Patient advised to call back or seek an in-person evaluation if the symptoms or condition worsens.  Duration of encounter: 60mnutes.  Note by: BGillis Santa MD Date: 08/10/2020; Time: 2:38 PM

## 2020-08-11 ENCOUNTER — Telehealth: Payer: Self-pay

## 2020-08-11 ENCOUNTER — Other Ambulatory Visit: Payer: Self-pay | Admitting: *Deleted

## 2020-08-11 NOTE — Telephone Encounter (Signed)
Patient had surgery last Thursday.  She is calling to let us know that her drain is itching and would like to know if there is anything she can do to make it stop.  Also, the drainage is bloody-looking. She would like to know if this is ok.  Please call.

## 2020-08-12 ENCOUNTER — Telehealth: Payer: BC Managed Care – PPO | Admitting: Student in an Organized Health Care Education/Training Program

## 2020-08-12 NOTE — Telephone Encounter (Signed)
Called the patient back on yesterday regarding the message below.  Informed the patient I spoke with [facility] and she stated that she can take Benadryl at night, Allergy pill during the day, and Hydrocortisone cream near the drain site, but do not get it in the drain site.    Also informed the patient that the bloody-looking drainage in the drain is normal drainage.  Patient verbalized understanding and agreed.//AB/CMA

## 2020-08-13 ENCOUNTER — Encounter: Payer: BC Managed Care – PPO | Admitting: Plastic Surgery

## 2020-08-13 ENCOUNTER — Other Ambulatory Visit: Payer: Self-pay

## 2020-08-13 ENCOUNTER — Encounter: Payer: Self-pay | Admitting: Plastic Surgery

## 2020-08-13 ENCOUNTER — Ambulatory Visit (INDEPENDENT_AMBULATORY_CARE_PROVIDER_SITE_OTHER): Payer: BC Managed Care – PPO | Admitting: Plastic Surgery

## 2020-08-13 VITALS — BP 110/74 | HR 84 | Temp 97.9°F

## 2020-08-13 DIAGNOSIS — C50411 Malignant neoplasm of upper-outer quadrant of right female breast: Secondary | ICD-10-CM

## 2020-08-13 DIAGNOSIS — Z171 Estrogen receptor negative status [ER-]: Secondary | ICD-10-CM

## 2020-08-13 NOTE — Progress Notes (Signed)
   Subjective:    Patient ID: Beth Wells, female    DOB: 1976/03/05, 44 y.o.   MRN: [id number]  Follow-up with her husband after a right knee with expander placement.  She had 100 cc placed in the OR.  She has a fair bit of bruising.  This is normal.  No sign of a hematoma.  There is a little bit of fluid collection in her axilla and I was able to work that out of the drain.  Drain outputs been minimal but we will keep it in for another week.   Review of Systems  Constitutional: Negative.   HENT: Negative.   Eyes: Negative.   Respiratory: Negative.   Genitourinary: Negative.        Objective:   Physical Exam Vitals and nursing note reviewed.  Constitutional:      Appearance: Normal appearance.  Cardiovascular:     Rate and Rhythm: Normal rate.     Pulses: Normal pulses.  Neurological:     General: No focal deficit present.     Mental Status: She is alert and oriented to person, place, and time.  Psychiatric:        Mood and Affect: Mood normal.        Behavior: Behavior normal.           Assessment & Plan:     ICD-10-CM   1. Carcinoma of upper-outer quadrant of right breast in female, estrogen receptor negative ([place])  C50.411    Z17.1      We placed injectable saline in the Expander using a sterile technique: Right: 50 cc for a total of 150 / 350 cc  We will plan to see her back in 1 week for drain.

## 2020-08-17 ENCOUNTER — Inpatient Hospital Stay (HOSPITAL_BASED_OUTPATIENT_CLINIC_OR_DEPARTMENT_OTHER): Payer: BC Managed Care – PPO | Admitting: Internal Medicine

## 2020-08-17 VITALS — BP 132/96 | HR 100 | Temp 97.8°F | Resp 16 | Wt 103.2 lb

## 2020-08-17 DIAGNOSIS — Z171 Estrogen receptor negative status [ER-]: Secondary | ICD-10-CM | POA: Diagnosis not present

## 2020-08-17 DIAGNOSIS — C50411 Malignant neoplasm of upper-outer quadrant of right female breast: Secondary | ICD-10-CM

## 2020-08-17 DIAGNOSIS — Z9011 Acquired absence of right breast and nipple: Secondary | ICD-10-CM | POA: Diagnosis not present

## 2020-08-17 DIAGNOSIS — K219 Gastro-esophageal reflux disease without esophagitis: Secondary | ICD-10-CM | POA: Diagnosis not present

## 2020-08-17 DIAGNOSIS — Z5111 Encounter for antineoplastic chemotherapy: Secondary | ICD-10-CM | POA: Diagnosis not present

## 2020-08-17 DIAGNOSIS — I10 Essential (primary) hypertension: Secondary | ICD-10-CM | POA: Diagnosis not present

## 2020-08-17 DIAGNOSIS — Z79899 Other long term (current) drug therapy: Secondary | ICD-10-CM | POA: Diagnosis not present

## 2020-08-17 MED ORDER — PROCHLORPERAZINE MALEATE 10 MG PO TABS: 10.0000 mg | ORAL_TABLET | Freq: Four times a day (QID) | ORAL | 1 refills | Status: DC | PRN

## 2020-08-17 MED ORDER — LIDOCAINE-PRILOCAINE 2.5-2.5 % EX CREA: 1.0000 "application " | TOPICAL_CREAM | CUTANEOUS | 0 refills | Status: DC | PRN

## 2020-08-17 MED ORDER — ONDANSETRON HCL 8 MG PO TABS: ORAL_TABLET | ORAL | 1 refills | Status: DC

## 2020-08-17 NOTE — Assessment & Plan Note (Addendum)
#   Stage I triple negative breast cancer; reviewed the pathology/stage with the patient in detail.  Given young age aggressive histology; would recommend adjuvant chemotherapy.  No role for any adjuvant radiation.  #Discussed role of adjuvant chemotherapy with Adriamycin Cytoxan every 2 weeks x4 cyles followed by weekly Taxol x12.  Patient will need growth factor support during Adriamycin Cytoxan chemotherapy.  # Discussed the potential side effects including but not limited to-increasing fatigue, nausea vomiting, diarrhea, hair loss, sores in the mouth, increase risk of infection and also neuropathy. Also discussed regarding potential side effects of heart failure/leukemia with Adriamycin [usually less than 1%].  We will get a MUGA scan.   #Genetic predisposition: BARD-1 mutation; no significant risk for ovarian malignancy.  Consider GYN oncology referral down the line.  Discussed prophylactic contralateral mastectomy again down the line.  #Small fiber neuropathy- [Dr.Shah]-currently on Cymbalta/Lyrica.  Discussed at length that needs to be monitored during chemotherapy especially Taxol.  Understand the Taxol will need to be discontinued if significant neuropathy is noted.  Understands that neuropathy could be longstanding.  #Fertility preserving-discussed the role of triptorelin in fertility preserving while undergoing chemotherapy for breast cancer.  However, if she is interested she could be evaluated by reproductive endocrinologist to harvest eggs. For now will proceed with Trelstar at least 1 week prior to starting chemo.   #Patient interested in scalp cooling [will check with nav]/cranial prosthesis; microbleeding/clotting of eyebrows; FMLA.   # DISPOSITION:  # chemo education ASAP; MUGA scanASAP # follow up in 2 weeks/friday- labs- MD; labs- cbc/cmp; Adriamycin- Cytoxan- [NEW] Dr.B

## 2020-08-17 NOTE — Progress Notes (Signed)
Had mastectomy since last visit

## 2020-08-17 NOTE — Progress Notes (Signed)
START ON PATHWAY REGIMEN - Breast     Cycles 1 through 4: A cycle is every 14 days:     Doxorubicin      Cyclophosphamide      Pegfilgrastim-xxxx    Cycles 5 through 16: A cycle is every 7 days:     Paclitaxel   **Always confirm dose/schedule in your pharmacy ordering system**  Patient Characteristics: Postoperative without Neoadjuvant Therapy (Pathologic Staging), Invasive Disease, Adjuvant Therapy, HER2 Negative/Unknown/Equivocal, ER Negative/Unknown, Node Negative, pT1a-b, N0, Chemotherapy Indicated Therapeutic Status: Postoperative without Neoadjuvant Therapy (Pathologic Staging) AJCC Grade: G3 AJCC N Category: pN0 AJCC M Category: cM0 ER Status: Negative (-) AJCC 8 Stage Grouping: IB HER2 Status: Negative (-) Oncotype Dx Recurrence Score: Not Appropriate AJCC T Category: pT1b PR Status: Negative (-) Adjuvant Therapy Status: No Adjuvant Therapy Received Yet Intervention Indicated: Chemotherapy Intent of Therapy: Curative Intent, Discussed with Patient

## 2020-08-17 NOTE — Progress Notes (Signed)
one [place] NOTE  Patient Care Team: [place], [facility] as PCP - General  CHIEF COMPLAINTS/PURPOSE OF CONSULTATION: Breast cancer  #  Oncology History Overview Note  # OCT 2021-RIGHT BREAST- TRIPLE NEGATIVE Beth Wells; [US/mammo-39m]; Dr.Cintron; OCT 2021-USThere is a 7 mm mass in the right breast at 10 o'clock, favored to represent a complicated cyst;  No evidence of right axillary lymphadenopathy].   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 The report date is 08/02/2020.  The Multi-Cancer Panel offered by [facility] includes sequencing and/or deletion duplication testing of the following 85 genes: AIP, ALK, APC, ATM, AXIN2,BAP1,  BARD1, BLM, BMPR1A, BRCA1, BRCA2, BRIP1, CASR, CDC73, CDH1, CDK4, CDKN1B, CDKN1C, CDKN2A (p14ARF), CDKN2A (p16INK4a), CEBPA, CHEK2, CTNNA1, DICER1, DIS3L2, EGFR (c.2369C>T, p.Thr790Met variant only), EPCAM (Deletion/duplication testing only), FH, FLCN, GATA2, GPC3, GREM1 (Promoter region deletion/duplication testing only), HOXB13 (c.251G>A, p.Gly84Glu), HRAS, KIT, MAX, MEN1, MET, MITF (c.952G>A, p.Glu318Lys variant only), MLH1, MSH2, MSH3, MSH6, MUTYH, NBN, NF1, NF2, NTHL1, PALB2, PDGFRA, PHOX2B, PMS2, POLD1, POLE, POT1, PRKAR1A, PTCH1, PTEN, RAD50, RAD51C, RAD51D, RB1, RECQL4, RET, RNF43, RUNX1, SDHAF2, SDHA (sequence changes only), SDHB, SDHC, SDHD, SMAD4, SMARCA4, SMARCB1, SMARCE1, STK11, SUFU, TERC,  TERT, TMEM127, TP53, TSC1, TSC2, VHL, WRN and WT1.    08/17/2020 -  Chemotherapy   The patient had DEXAMETHASONE 4 MG PO TABS, 8 mg, Oral, Daily, 0 of 1 cycle, Start date: --, End date: -- DOXOrubicin (ADRIAMYCIN) chemo injection 86 mg, 60 mg/m2, Intravenous,  Once, 0 of 4 cycles PALONOSETRON HCL INJECTION 0.25 MG/5ML, 0.25 mg, Intravenous,  Once, 0 of 4 cycles pegfilgrastim-jmdb (FULPHILA) injection 6 mg, 6 mg, Subcutaneous,  Once, 0 of 4 cycles cyclophosphamide (CYTOXAN) 860 mg in sodium chloride 0.9 % 250 mL chemo infusion, 600 mg/m2, Intravenous,  Once, 0 of 4 cycles PACLitaxel (TAXOL) 114 mg in sodium chloride 0.9 % 250 mL chemo infusion (</= 847mm2), 80 mg/m2, Intravenous,  Once, 0 of 12 cycles FOSAPREPITANT IV INFUSION 150 MG, 150 mg, Intravenous,  Once, 0 of 4 cycles  for chemotherapy treatment.       HISTORY OF PRESENTING ILLNESS:  Beth Dagley89.o.  female   with early stage triple negative breast cancer is here for follow-up.  Patient is currently s/p mastectomy.  Healing well.  Patient had immediate reconstruction.  No nausea no vomiting.  No fevers or chills.  Review of Systems  Constitutional: Negative for chills, diaphoresis, fever, malaise/fatigue and weight loss.  HENT: Negative for nosebleeds and sore throat.   Eyes: Negative for double vision.  Respiratory: Negative for cough, hemoptysis, sputum production, shortness of breath and wheezing.   Cardiovascular: Negative for chest pain, palpitations, orthopnea and leg swelling.  Gastrointestinal: Negative for abdominal pain, blood in stool, constipation, diarrhea, heartburn, melena, nausea and vomiting.  Genitourinary: Negative for dysuria, frequency and urgency.  Musculoskeletal: Negative for back pain and joint pain.  Skin: Negative.  Negative for itching  and rash.  Neurological: Positive for tingling. Negative for dizziness, focal weakness, weakness and headaches.  Endo/Heme/Allergies: Does not bruise/bleed  easily.  Psychiatric/Behavioral: Negative for depression. The patient is not nervous/anxious and does not have insomnia.      MEDICAL HISTORY:  Past Medical History:  Diagnosis Date  . Breast cancer ([place]) 08/05/2020  . Carcinoma of upper-outer quadrant of right breast in female, estrogen receptor negative ([place]) 07/13/2020  . Complication of anesthesia    hard to wake up-very dizzy feeling like she was going to pass out  . GERD (gastroesophageal reflux disease)    no meds  . H/O foot surgery 08/2018  . History of kidney stones    h/o  . Hypertension   . PONV (postoperative nausea and vomiting)    n/v aftter kidney stone surgery    SURGICAL HISTORY: Past Surgical History:  Procedure Laterality Date  . BREAST BIOPSY Right 07/06/2020   u/s bx Q clip path pending  . BREAST RECONSTRUCTION WITH PLACEMENT OF TISSUE EXPANDER AND FLEX HD (ACELLULAR HYDRATED DERMIS) Right 08/05/2020   Procedure: RIGHT BREAST RECONSTRUCTION WITH PLACEMENT OF TISSUE EXPANDER AND FLEX HD (ACELLULAR HYDRATED DERMIS);  Surgeon: Wallace Going, DO;  Location: [facility] OR[facility];  Service: Plastics;  Laterality: Right;  . BUNIONECTOMY Right    titanium pin  . CERVICAL CONE BIOPSY    . DILATION AND EVACUATION N/A 03/12/2018   Procedure: DILATATION AND EVACUATION;  Surgeon: [name], MD;  Location: [facility];  Service: Gynecology;  Laterality: N/A;  . KIDNEY STONE SURGERY    . LAPAROSCOPIC OVARIAN CYSTECTOMY Right 06/07/2019   Procedure: LAPAROSCOPIC OVARIAN CYSTECTOMY;  Surgeon: [name], MD;  Location: [facility];  Service: Gynecology;  Laterality: Right;  . LAPAROSCOPY N/A 06/07/2019   Procedure: LAPAROSCOPY Luvenia Redden BIOPSIES;  Surgeon: [name], MD;  Location: [facility];  Service: Gynecology;  Laterality: N/A;  . PORTACATH PLACEMENT Left 08/05/2020   Procedure: INSERTION PORT-A-CATH;  Surgeon: Herbert Pun, MD;  Location: [facility];  Service: General;  Laterality: Left;  .  TOTAL MASTECTOMY Right 08/05/2020   Procedure: TOTAL MASTECTOMY w/ Sentinel Node;  Surgeon: Herbert Pun, MD;  Location: [facility];  Service: General;  Laterality: Right;    SOCIAL HISTORY: Social History   Socioeconomic History  . Marital status: Married    Spouse name: Not on file  . Number of children: Not on file  . Years of education: Not on file  . Highest education level: Not on file  Occupational History  . Not on file  Tobacco Use  . Smoking status: Never Smoker  . Smokeless tobacco: Never Used  Vaping Use  . Vaping Use: Never used  Substance and Sexual Activity  . Alcohol use: Not Currently  . Drug use: Never  . Sexual activity: Yes  Other Topics Concern  . 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 Lives in [place].    Social Determinants of Health   Financial Resource Strain:   . Difficulty of Paying Living Expenses: Not on file  Food Insecurity:   . Worried About Charity fundraiser in the Last Year: Not on file  . Ran Out of Food in the Last Year: Not on file  Transportation Needs:   . Lack of Transportation (Medical): Not on file  . Lack of Transportation (Non-Medical): Not on file  Physical Activity:   . Days of Exercise per Week: Not on file  .  Minutes of Exercise per Session: Not on file  Stress:   . Feeling of Stress : Not on file  Social Connections:   . Frequency of Communication with Friends and Family: Not on file  . Frequency of Social Gatherings with Friends and Family: Not on file  . Attends Religious Services: Not on file  . Active Member of Clubs or Organizations: Not on file  . Attends Archivist Meetings: Not on file  . Marital Status: Not on file  Intimate Partner Violence:   . Fear of Current or Ex-Partner: Not on file  . Emotionally Abused: Not on file  . Physically Abused: Not on file  . Sexually Abused: Not on file    FAMILY  HISTORY: Family History  Problem Relation Age of Onset  . Cancer Other        great aunt- GI cancer  . Breast cancer Neg Hx     ALLERGIES:  has No Known Allergies.  MEDICATIONS:  Current Outpatient Medications  Medication Sig Dispense Refill  . acetaminophen (TYLENOL) 500 MG tablet Take 1 tablet (500 mg total) by mouth every 6 (six) hours as needed. For use AFTER surgery 30 tablet 0  . ALPHA-LIPOIC ACID PO Take 500 mg by mouth daily.     Marland Kitchen amphetamine-dextroamphetamine (ADDERALL XR) 30 MG 24 hr capsule Take 30 mg by mouth daily.     . DULoxetine (CYMBALTA) 20 MG capsule Take 2 capsules (40 mg total) by mouth at bedtime. 60 capsule 2  . ibuprofen (ADVIL) 200 MG tablet Take 400 mg by mouth every 6 (six) hours as needed for headache or moderate pain.    Marland Kitchen ibuprofen (ADVIL) 600 MG tablet Take 1 tablet (600 mg total) by mouth every 6 (six) hours as needed for mild pain or moderate pain. For use AFTER surgery 30 tablet 0  . lisinopril (ZESTRIL) 5 MG tablet Take 5 mg by mouth every evening.     . ondansetron (ZOFRAN) 4 MG tablet Take 1 tablet (4 mg total) by mouth every 8 (eight) hours as needed for nausea or vomiting. 20 tablet 0  . pregabalin (LYRICA) 100 MG capsule Take 1 capsule (100 mg total) by mouth 2 (two) times daily. 180 capsule 1  . Probiotic Product (PROBIOTIC ADVANCED PO) Take 1 capsule by mouth daily.     Marland Kitchen trolamine salicylate (ASPERCREME) 10 % cream Apply 1 application topically as needed for muscle pain.    Marland Kitchen lidocaine-prilocaine (EMLA) cream Apply 1 application topically as needed. On the port generously; 30-45 mins prior to chemo appt. 30 g 0  . ondansetron (ZOFRAN) 8 MG tablet One pill every 8 hours as needed for nausea/vomitting. 40 tablet 1  . prochlorperazine (COMPAZINE) 10 MG tablet Take 1 tablet (10 mg total) by mouth every 6 (six) hours as needed for nausea or vomiting. 40 tablet 1   No current facility-administered medications for this visit.      Marland Kitchen  PHYSICAL  EXAMINATION: ECOG PERFORMANCE STATUS: 0 - Asymptomatic  Vitals:   08/17/20 1521  BP: (!) 132/96  Pulse: 100  Resp: 16  Temp: 97.8 F (36.6 C)  SpO2: 100%   Filed Weights   08/17/20 1521  Weight: 103 lb 3.2 oz (46.8 kg)    Physical Exam HENT:     Head: Normocephalic and atraumatic.     Mouth/Throat:     Pharynx: No oropharyngeal exudate.  Eyes:     Pupils: Pupils are equal, round, and reactive to light.  Cardiovascular:  Rate and Rhythm: Normal rate and regular rhythm.  Pulmonary:     Effort: Pulmonary effort is normal. No respiratory distress.     Breath sounds: Normal breath sounds. No wheezing.  Abdominal:     General: Bowel sounds are normal. There is no distension.     Palpations: Abdomen is soft. There is no mass.     Tenderness: There is no abdominal tenderness. There is no guarding or rebound.  Musculoskeletal:        General: No tenderness. Normal range of motion.     Cervical back: Normal range of motion and neck supple.  Skin:    General: Skin is warm.     Comments: Breast exam deferred  Neurological:     Mental Status: She is alert and oriented to person, place, and time.  Psychiatric:        Mood and Affect: Affect normal.      LABORATORY DATA:  I have reviewed the data as listed Lab Results  Component Value Date   WBC 11.2 (H) 06/07/2019   HGB 12.8 06/07/2019   HCT 39.0 06/07/2019   MCV 93.5 06/07/2019   PLT 214 06/07/2019   No results for input(s): NA, K, CL, CO2, GLUCOSE, BUN, CREATININE, CALCIUM, GFRNONAA, GFRAA, PROT, ALBUMIN, AST, ALT, ALKPHOS, BILITOT, BILIDIR, IBILI in the last 8760 hours.  RADIOGRAPHIC STUDIES: I have personally reviewed the radiological images as listed and agreed with the findings in the report. DG Chest 1 View  Result Date: 08/05/2020 CLINICAL DATA:  Port-A-Cath. EXAM: CHEST  1 VIEW COMPARISON:  None. FINDINGS: There is a grossly well-positioned left-sided Port-A-Cath in place. There is no left-sided  pneumothorax. A surgical drain projects over the patient's right chest wall. The lungs are essentially clear without evidence for focal infiltrate or large pleural effusion. There are pockets of subcutaneous gas along the patient's right flank favored to be postsurgical in etiology. There is no acute displaced fracture. The heart size is unremarkable. IMPRESSION: 1. Well-positioned left-sided Port-A-Cath. 2. No pneumothorax. 3. Subcutaneous gas along the patient's right flank favored to be postsurgical in etiology. Electronically Signed   By: Constance Holster M.D.   On: 08/05/2020 20:14   NM Sentinel Node Inj-No Rpt (Breast)  Result Date: 08/05/2020 Sulfur colloid was injected by the nuclear medicine technologist for melanoma sentinel node.   DG C-Arm 1-60 Min-No Report  Result Date: 08/05/2020 Fluoroscopy was utilized by the requesting physician.  No radiographic interpretation.    ASSESSMENT & PLAN:   Carcinoma of upper-outer quadrant of right breast in female, estrogen receptor negative ([place]) # Stage I triple negative breast cancer; reviewed the pathology/stage with the patient in detail.  Given young age aggressive histology; would recommend adjuvant chemotherapy.  No role for any adjuvant radiation.  #Discussed role of adjuvant chemotherapy with Adriamycin Cytoxan every 2 weeks x4 cyles followed by weekly Taxol x12.  Patient will need growth factor support during Adriamycin Cytoxan chemotherapy.  # Discussed the potential side effects including but not limited to-increasing fatigue, nausea vomiting, diarrhea, hair loss, sores in the mouth, increase risk of infection and also neuropathy. Also discussed regarding potential side effects of heart failure/leukemia with Adriamycin [usually less than 1%].  We will get a MUGA scan.   #Genetic predisposition: BARD-1 mutation; no significant risk for ovarian malignancy.  Consider GYN oncology referral down the line.  Discussed prophylactic  contralateral mastectomy again down the line.  #Small fiber neuropathy- [Dr.Shah]-currently on Cymbalta/Lyrica.  Discussed at length that needs to be  monitored during chemotherapy especially Taxol.  Understand the Taxol will need to be discontinued if significant neuropathy is noted.  Understands that neuropathy could be longstanding.  #Fertility preserving-discussed the role of triptorelin in fertility preserving while undergoing chemotherapy for breast cancer.  However, if she is interested she could be evaluated by reproductive endocrinologist to harvest eggs. For now will proceed with Trelstar at least 1 week prior to starting chemo.   #Patient interested in scalp cooling [will check with nav]/cranial prosthesis; microbleeding/clotting of eyebrows; FMLA.   # DISPOSITION:  # chemo education ASAP; MUGA scanASAP # follow up in 2 weeks/friday- labs- MD; labs- cbc/cmp; Adriamycin- Cytoxan- [NEW] Dr.B   All questions were answered. The patient/family knows to call the clinic with any problems, questions or concerns.    Cammie Sickle, MD 08/17/2020 5:09 PM

## 2020-08-18 ENCOUNTER — Encounter: Payer: Self-pay | Admitting: *Deleted

## 2020-08-18 NOTE — Progress Notes (Signed)
Per Lennox Grumbles, RN's message, patient had questions regarding the cold cap for chemotherapy.  Called patient and briefly discussed the [place].  Number and website given to patient.  Informed she will need to contact the company if she plans to use the [employer].  She is agreeable.

## 2020-08-18 NOTE — Patient Instructions (Signed)
Doxorubicin injection What is this medicine? DOXORUBICIN (dox oh ROO bi sin) is a chemotherapy drug. It is used to treat many kinds of cancer like leukemia, lymphoma, neuroblastoma, sarcoma, and Wilms' tumor. It is also used to treat bladder cancer, breast cancer, lung cancer, ovarian cancer, stomach cancer, and thyroid cancer. This medicine may be used for other purposes; ask your health care provider or pharmacist if you have questions. COMMON BRAND NAME(S): Adriamycin, Adriamycin PFS, Adriamycin RDF, Rubex What should I tell my health care provider before I take this medicine? They need to know if you have any of these conditions:  heart disease  history of low blood counts caused by a medicine  liver disease  recent or ongoing radiation therapy  an unusual or allergic reaction to doxorubicin, other chemotherapy agents, other medicines, foods, dyes, or preservatives  pregnant or trying to get pregnant  breast-feeding How should I use this medicine? This drug is given as an infusion into a vein. It is administered in a hospital or clinic by a specially trained health care professional. If you have pain, swelling, burning or any unusual feeling around the site of your injection, tell your health care professional right away. Talk to your pediatrician regarding the use of this medicine in children. Special care may be needed. Overdosage: If you think you have taken too much of this medicine contact a poison control center or emergency room at once. NOTE: This medicine is only for you. Do not share this medicine with others. What if I miss a dose? It is important not to miss your dose. Call your doctor or health care professional if you are unable to keep an appointment. What may interact with this medicine? This medicine may interact with the following medications:  6-mercaptopurine  paclitaxel  phenytoin  St. John's Wort  trastuzumab  verapamil This list may not describe  all possible interactions. Give your health care provider a list of all the medicines, herbs, non-prescription drugs, or dietary supplements you use. Also tell them if you smoke, drink alcohol, or use illegal drugs. Some items may interact with your medicine. What should I watch for while using this medicine? This drug may make you feel generally unwell. This is not uncommon, as chemotherapy can affect healthy cells as well as cancer cells. Report any side effects. Continue your course of treatment even though you feel ill unless your doctor tells you to stop. There is a maximum amount of this medicine you should receive throughout your life. The amount depends on the medical condition being treated and your overall health. Your doctor will watch how much of this medicine you receive in your lifetime. Tell your doctor if you have taken this medicine before. You may need blood work done while you are taking this medicine. Your urine may turn red for a few days after your dose. This is not blood. If your urine is dark or brown, call your doctor. In some cases, you may be given additional medicines to help with side effects. Follow all directions for their use. Call your doctor or health care professional for advice if you get a fever, chills or sore throat, or other symptoms of a cold or flu. Do not treat yourself. This drug decreases your body's ability to fight infections. Try to avoid being around people who are sick. This medicine may increase your risk to bruise or bleed. Call your doctor or health care professional if you notice any unusual bleeding. Talk to your doctor   about your risk of cancer. You may be more at risk for certain types of cancers if you take this medicine. Do not become pregnant while taking this medicine or for 6 months after stopping it. Women should inform their doctor if they wish to become pregnant or think they might be pregnant. Men should not father a child while taking this  medicine and for 6 months after stopping it. There is a potential for serious side effects to an unborn child. Talk to your health care professional or pharmacist for more information. Do not breast-feed an infant while taking this medicine. This medicine has caused ovarian failure in some women and reduced sperm counts in some men This medicine may interfere with the ability to have a child. Talk with your doctor or health care professional if you are concerned about your fertility. This medicine may cause a decrease in Co-Enzyme Q-10. You should make sure that you get enough Co-Enzyme Q-10 while you are taking this medicine. Discuss the foods you eat and the vitamins you take with your health care professional. What side effects may I notice from receiving this medicine? Side effects that you should report to your doctor or health care professional as soon as possible:  allergic reactions like skin rash, itching or hives, swelling of the face, lips, or tongue  breathing problems  chest pain  fast or irregular heartbeat  low blood counts - this medicine may decrease the number of white blood cells, red blood cells and platelets. You may be at increased risk for infections and bleeding.  pain, redness, or irritation at site where injected  signs of infection - fever or chills, cough, sore throat, pain or difficulty passing urine  signs of decreased platelets or bleeding - bruising, pinpoint red spots on the skin, black, tarry stools, blood in the urine  swelling of the ankles, feet, hands  tiredness  weakness Side effects that usually do not require medical attention (report to your doctor or health care professional if they continue or are bothersome):  diarrhea  hair loss  mouth sores  nail discoloration or damage  nausea  red colored urine  vomiting This list may not describe all possible side effects. Call your doctor for medical advice about side effects. You may report  side effects to FDA at 1-800-FDA-1088. Where should I keep my medicine? This drug is given in a hospital or clinic and will not be stored at home. NOTE: This sheet is a summary. It may not cover all possible information. If you have questions about this medicine, talk to your doctor, pharmacist, or health care provider.  2020 Elsevier/Gold Standard (2017-04-25 11:01:26) Cyclophosphamide Injection What is this medicine? CYCLOPHOSPHAMIDE (sye kloe FOSS fa mide) is a chemotherapy drug. It slows the growth of cancer cells. This medicine is used to treat many types of cancer like lymphoma, myeloma, leukemia, breast cancer, and ovarian cancer, to name a few. This medicine may be used for other purposes; ask your health care provider or pharmacist if you have questions. COMMON BRAND NAME(S): Cytoxan, Neosar What should I tell my health care provider before I take this medicine? They need to know if you have any of these conditions:  heart disease  history of irregular heartbeat  infection  kidney disease  liver disease  low blood counts, like white cells, platelets, or red blood cells  on hemodialysis  recent or ongoing radiation therapy  scarring or thickening of the lungs  trouble passing urine  an   unusual or allergic reaction to cyclophosphamide, other medicines, foods, dyes, or preservatives  pregnant or trying to get pregnant  breast-feeding How should I use this medicine? This drug is usually given as an injection into a vein or muscle or by infusion into a vein. It is administered in a hospital or clinic by a specially trained health care professional. Talk to your pediatrician regarding the use of this medicine in children. Special care may be needed. Overdosage: If you think you have taken too much of this medicine contact a poison control center or emergency room at once. NOTE: This medicine is only for you. Do not share this medicine with others. What if I miss a  dose? It is important not to miss your dose. Call your doctor or health care professional if you are unable to keep an appointment. What may interact with this medicine?  amphotericin B  azathioprine  certain antivirals for HIV or hepatitis  certain medicines for blood pressure, heart disease, irregular heart beat  certain medicines that treat or prevent blood clots like warfarin  certain other medicines for cancer  cyclosporine  etanercept  indomethacin  medicines that relax muscles for surgery  medicines to increase blood counts  metronidazole This list may not describe all possible interactions. Give your health care provider a list of all the medicines, herbs, non-prescription drugs, or dietary supplements you use. Also tell them if you smoke, drink alcohol, or use illegal drugs. Some items may interact with your medicine. What should I watch for while using this medicine? Your condition will be monitored carefully while you are receiving this medicine. You may need blood work done while you are taking this medicine. Drink water or other fluids as directed. Urinate often, even at night. Some products may contain alcohol. Ask your health care professional if this medicine contains alcohol. Be sure to tell all health care professionals you are taking this medicine. Certain medicines, like metronidazole and disulfiram, can cause an unpleasant reaction when taken with alcohol. The reaction includes flushing, headache, nausea, vomiting, sweating, and increased thirst. The reaction can last from 30 minutes to several hours. Do not become pregnant while taking this medicine or for 1 year after stopping it. Women should inform their health care professional if they wish to become pregnant or think they might be pregnant. Men should not father a child while taking this medicine and for 4 months after stopping it. There is potential for serious side effects to an unborn child. Talk to your  health care professional for more information. Do not breast-feed an infant while taking this medicine or for 1 week after stopping it. This medicine has caused ovarian failure in some women. This medicine may make it more difficult to get pregnant. Talk to your health care professional if you are concerned about your fertility. This medicine has caused decreased sperm counts in some men. This may make it more difficult to father a child. Talk to your health care professional if you are concerned about your fertility. Call your health care professional for advice if you get a fever, chills, or sore throat, or other symptoms of a cold or flu. Do not treat yourself. This medicine decreases your body's ability to fight infections. Try to avoid being around people who are sick. Avoid taking medicines that contain aspirin, acetaminophen, ibuprofen, naproxen, or ketoprofen unless instructed by your health care professional. These medicines may hide a fever. Talk to your health care professional about your risk of cancer.   You may be more at risk for certain types of cancer if you take this medicine. If you are going to need surgery or other procedure, tell your health care professional that you are using this medicine. Be careful brushing or flossing your teeth or using a toothpick because you may get an infection or bleed more easily. If you have any dental work done, tell your dentist you are receiving this medicine. What side effects may I notice from receiving this medicine? Side effects that you should report to your doctor or health care professional as soon as possible:  allergic reactions like skin rash, itching or hives, swelling of the face, lips, or tongue  breathing problems  nausea, vomiting  signs and symptoms of bleeding such as bloody or black, tarry stools; red or dark brown urine; spitting up blood or brown material that looks like coffee grounds; red spots on the skin; unusual bruising  or bleeding from the eyes, gums, or nose  signs and symptoms of heart failure like fast, irregular heartbeat, sudden weight gain; swelling of the ankles, feet, hands  signs and symptoms of infection like fever; chills; cough; sore throat; pain or trouble passing urine  signs and symptoms of kidney injury like trouble passing urine or change in the amount of urine  signs and symptoms of liver injury like dark yellow or brown urine; general ill feeling or flu-like symptoms; light-colored stools; loss of appetite; nausea; right upper belly pain; unusually weak or tired; yellowing of the eyes or skin Side effects that usually do not require medical attention (report to your doctor or health care professional if they continue or are bothersome):  confusion  decreased hearing  diarrhea  facial flushing  hair loss  headache  loss of appetite  missed menstrual periods  signs and symptoms of low red blood cells or anemia such as unusually weak or tired; feeling faint or lightheaded; falls  skin discoloration This list may not describe all possible side effects. Call your doctor for medical advice about side effects. You may report side effects to FDA at 1-800-FDA-1088. Where should I keep my medicine? This drug is given in a hospital or clinic and will not be stored at home. NOTE: This sheet is a summary. It may not cover all possible information. If you have questions about this medicine, talk to your doctor, pharmacist, or health care provider.  2020 Elsevier/Gold Standard (2019-06-16 09:53:29) Paclitaxel injection What is this medicine? PACLITAXEL (PAK li TAX el) is a chemotherapy drug. It targets fast dividing cells, like cancer cells, and causes these cells to die. This medicine is used to treat ovarian cancer, breast cancer, lung cancer, Kaposi's sarcoma, and other cancers. This medicine may be used for other purposes; ask your health care provider or pharmacist if you have  questions. COMMON BRAND NAME(S): Onxol, Taxol What should I tell my health care provider before I take this medicine? They need to know if you have any of these conditions:  history of irregular heartbeat  liver disease  low blood counts, like low white cell, platelet, or red cell counts  lung or breathing disease, like asthma  tingling of the fingers or toes, or other nerve disorder  an unusual or allergic reaction to paclitaxel, alcohol, polyoxyethylated castor oil, other chemotherapy, other medicines, foods, dyes, or preservatives  pregnant or trying to get pregnant  breast-feeding How should I use this medicine? This drug is given as an infusion into a vein. It is administered in a   hospital or clinic by a specially trained health care professional. Talk to your pediatrician regarding the use of this medicine in children. Special care may be needed. Overdosage: If you think you have taken too much of this medicine contact a poison control center or emergency room at once. NOTE: This medicine is only for you. Do not share this medicine with others. What if I miss a dose? It is important not to miss your dose. Call your doctor or health care professional if you are unable to keep an appointment. What may interact with this medicine? Do not take this medicine with any of the following medications:  disulfiram  metronidazole This medicine may also interact with the following medications:  antiviral medicines for hepatitis, HIV or AIDS  certain antibiotics like erythromycin and clarithromycin  certain medicines for fungal infections like ketoconazole and itraconazole  certain medicines for seizures like carbamazepine, phenobarbital, phenytoin  gemfibrozil  nefazodone  rifampin  St. John's wort This list may not describe all possible interactions. Give your health care provider a list of all the medicines, herbs, non-prescription drugs, or dietary supplements you use.  Also tell them if you smoke, drink alcohol, or use illegal drugs. Some items may interact with your medicine. What should I watch for while using this medicine? Your condition will be monitored carefully while you are receiving this medicine. You will need important blood work done while you are taking this medicine. This medicine can cause serious allergic reactions. To reduce your risk you will need to take other medicine(s) before treatment with this medicine. If you experience allergic reactions like skin rash, itching or hives, swelling of the face, lips, or tongue, tell your doctor or health care professional right away. In some cases, you may be given additional medicines to help with side effects. Follow all directions for their use. This drug may make you feel generally unwell. This is not uncommon, as chemotherapy can affect healthy cells as well as cancer cells. Report any side effects. Continue your course of treatment even though you feel ill unless your doctor tells you to stop. Call your doctor or health care professional for advice if you get a fever, chills or sore throat, or other symptoms of a cold or flu. Do not treat yourself. This drug decreases your body's ability to fight infections. Try to avoid being around people who are sick. This medicine may increase your risk to bruise or bleed. Call your doctor or health care professional if you notice any unusual bleeding. Be careful brushing and flossing your teeth or using a toothpick because you may get an infection or bleed more easily. If you have any dental work done, tell your dentist you are receiving this medicine. Avoid taking products that contain aspirin, acetaminophen, ibuprofen, naproxen, or ketoprofen unless instructed by your doctor. These medicines may hide a fever. Do not become pregnant while taking this medicine. Women should inform their doctor if they wish to become pregnant or think they might be pregnant. There is a  potential for serious side effects to an unborn child. Talk to your health care professional or pharmacist for more information. Do not breast-feed an infant while taking this medicine. Men are advised not to father a child while receiving this medicine. This product may contain alcohol. Ask your pharmacist or healthcare provider if this medicine contains alcohol. Be sure to tell all healthcare providers you are taking this medicine. Certain medicines, like metronidazole and disulfiram, can cause an unpleasant reaction when   taken with alcohol. The reaction includes flushing, headache, nausea, vomiting, sweating, and increased thirst. The reaction can last from 30 minutes to several hours. What side effects may I notice from receiving this medicine? Side effects that you should report to your doctor or health care professional as soon as possible:  allergic reactions like skin rash, itching or hives, swelling of the face, lips, or tongue  breathing problems  changes in vision  fast, irregular heartbeat  high or low blood pressure  mouth sores  pain, tingling, numbness in the hands or feet  signs of decreased platelets or bleeding - bruising, pinpoint red spots on the skin, black, tarry stools, blood in the urine  signs of decreased red blood cells - unusually weak or tired, feeling faint or lightheaded, falls  signs of infection - fever or chills, cough, sore throat, pain or difficulty passing urine  signs and symptoms of liver injury like dark yellow or brown urine; general ill feeling or flu-like symptoms; light-colored stools; loss of appetite; nausea; right upper belly pain; unusually weak or tired; yellowing of the eyes or skin  swelling of the ankles, feet, hands  unusually slow heartbeat Side effects that usually do not require medical attention (report to your doctor or health care professional if they continue or are bothersome):  diarrhea  hair loss  loss of  appetite  muscle or joint pain  nausea, vomiting  pain, redness, or irritation at site where injected  tiredness This list may not describe all possible side effects. Call your doctor for medical advice about side effects. You may report side effects to FDA at 1-800-FDA-1088. Where should I keep my medicine? This drug is given in a hospital or clinic and will not be stored at home. NOTE: This sheet is a summary. It may not cover all possible information. If you have questions about this medicine, talk to your doctor, pharmacist, or health care provider.  2020 Elsevier/Gold Standard (2017-05-15 13:14:55) Pegfilgrastim injection What is this medicine? PEGFILGRASTIM (PEG fil gra stim) is a long-acting granulocyte colony-stimulating factor that stimulates the growth of neutrophils, a type of white blood cell important in the body's fight against infection. It is used to reduce the incidence of fever and infection in patients with certain types of cancer who are receiving chemotherapy that affects the bone marrow, and to increase survival after being exposed to high doses of radiation. This medicine may be used for other purposes; ask your health care provider or pharmacist if you have questions. COMMON BRAND NAME(S): Steve Rattler, Ziextenzo What should I tell my health care provider before I take this medicine? They need to know if you have any of these conditions:  kidney disease  latex allergy  ongoing radiation therapy  sickle cell disease  skin reactions to acrylic adhesives (On-Body Injector only)  an unusual or allergic reaction to pegfilgrastim, filgrastim, other medicines, foods, dyes, or preservatives  pregnant or trying to get pregnant  breast-feeding How should I use this medicine? This medicine is for injection under the skin. If you get this medicine at home, you will be taught how to prepare and give the pre-filled syringe or how to use the On-body  Injector. Refer to the patient Instructions for Use for detailed instructions. Use exactly as directed. Tell your healthcare provider immediately if you suspect that the On-body Injector may not have performed as intended or if you suspect the use of the On-body Injector resulted in a missed or partial dose. It  is important that you put your used needles and syringes in a special sharps container. Do not put them in a trash can. If you do not have a sharps container, call your pharmacist or healthcare provider to get one. Talk to your pediatrician regarding the use of this medicine in children. While this drug may be prescribed for selected conditions, precautions do apply. Overdosage: If you think you have taken too much of this medicine contact a poison control center or emergency room at once. NOTE: This medicine is only for you. Do not share this medicine with others. What if I miss a dose? It is important not to miss your dose. Call your doctor or health care professional if you miss your dose. If you miss a dose due to an On-body Injector failure or leakage, a new dose should be administered as soon as possible using a single prefilled syringe for manual use. What may interact with this medicine? Interactions have not been studied. Give your health care provider a list of all the medicines, herbs, non-prescription drugs, or dietary supplements you use. Also tell them if you smoke, drink alcohol, or use illegal drugs. Some items may interact with your medicine. This list may not describe all possible interactions. Give your health care provider a list of all the medicines, herbs, non-prescription drugs, or dietary supplements you use. Also tell them if you smoke, drink alcohol, or use illegal drugs. Some items may interact with your medicine. What should I watch for while using this medicine? You may need blood work done while you are taking this medicine. If you are going to need a MRI, CT scan,  or other procedure, tell your doctor that you are using this medicine (On-Body Injector only). What side effects may I notice from receiving this medicine? Side effects that you should report to your doctor or health care professional as soon as possible:  allergic reactions like skin rash, itching or hives, swelling of the face, lips, or tongue  back pain  dizziness  fever  pain, redness, or irritation at site where injected  pinpoint red spots on the skin  red or dark-brown urine  shortness of breath or breathing problems  stomach or side pain, or pain at the shoulder  swelling  tiredness  trouble passing urine or change in the amount of urine Side effects that usually do not require medical attention (report to your doctor or health care professional if they continue or are bothersome):  bone pain  muscle pain This list may not describe all possible side effects. Call your doctor for medical advice about side effects. You may report side effects to FDA at 1-800-FDA-1088. Where should I keep my medicine? Keep out of the reach of children. If you are using this medicine at home, you will be instructed on how to store it. Throw away any unused medicine after the expiration date on the label. NOTE: This sheet is a summary. It may not cover all possible information. If you have questions about this medicine, talk to your doctor, pharmacist, or health care provider.  2020 Elsevier/Gold Standard (2017-12-17 16:57:08)

## 2020-08-23 ENCOUNTER — Other Ambulatory Visit: Payer: Self-pay

## 2020-08-23 ENCOUNTER — Inpatient Hospital Stay: Payer: BC Managed Care – PPO | Admitting: Nurse Practitioner

## 2020-08-23 ENCOUNTER — Inpatient Hospital Stay: Payer: BC Managed Care – PPO

## 2020-08-24 ENCOUNTER — Ambulatory Visit (INDEPENDENT_AMBULATORY_CARE_PROVIDER_SITE_OTHER): Payer: BC Managed Care – PPO | Admitting: Surgical

## 2020-08-24 ENCOUNTER — Encounter: Payer: Self-pay | Admitting: *Deleted

## 2020-08-24 ENCOUNTER — Encounter: Payer: Self-pay | Admitting: Surgical

## 2020-08-24 ENCOUNTER — Other Ambulatory Visit: Payer: Self-pay

## 2020-08-24 ENCOUNTER — Other Ambulatory Visit: Payer: Self-pay | Admitting: Internal Medicine

## 2020-08-24 VITALS — BP 125/84 | HR 89 | Temp 98.1°F

## 2020-08-24 DIAGNOSIS — Z171 Estrogen receptor negative status [ER-]: Secondary | ICD-10-CM

## 2020-08-24 DIAGNOSIS — C50411 Malignant neoplasm of upper-outer quadrant of right female breast: Secondary | ICD-10-CM

## 2020-08-24 NOTE — Progress Notes (Signed)
Patient is a 44 year old female here for follow-up after right breast reconstruction with placement of tissue expander with Dr. Marla Roe on 08/05/2020.  Patient reports she is overall doing well, reports JP drain output has been occasionally 20 to 30 cc, but over the past few days per review of her notes it was between 30 and 50 cc.  She is not having any infectious symptoms.  She reports that she tolerated last fill fine, did have some cramping but the Valium helped.  Patient reports she is comfortable with the size of the right breast.  Chaperone present on exam On exam right breast inframammary fold incision intact, right NAC viable with good color.  No necrosis noted.  Right JP drain in place with serosanguineous drainage in the bulb.  Approximately 25 cc in the bulb currently, last emptied last night.  We placed injectable saline in the Expander using a sterile technique: Right: 20 cc for a total of 170 / 350 cc  I discussed with the patient that we would plan to slightly overfilled the expander as this will help during expander to implant exchange.  I discussed with the patient that everything appears to be healing well, no sign of infection.  We discussed the plan moving forward.  We discussed that during chemotherapy we would avoid filling and we would plan for exchange once chemotherapy has been completed.  Patient is scheduled to start chemotherapy 09/10/2020.  She is going to follow-up with me in 1 week for reevaluation and removal of right JP drain. There is no sign of infection.

## 2020-08-24 NOTE — Progress Notes (Signed)
Ordered zoladex; to be given at least 1 week priro to starting chemo.

## 2020-08-26 ENCOUNTER — Encounter: Payer: Self-pay | Admitting: *Deleted

## 2020-08-28 ENCOUNTER — Other Ambulatory Visit: Payer: Self-pay | Admitting: Internal Medicine

## 2020-08-28 NOTE — Progress Notes (Signed)
C- schedule udenyca on 12/20- Thanks GB

## 2020-08-30 ENCOUNTER — Other Ambulatory Visit: Payer: Self-pay

## 2020-08-30 ENCOUNTER — Encounter: Payer: Self-pay | Admitting: Nurse Practitioner

## 2020-08-30 ENCOUNTER — Inpatient Hospital Stay (HOSPITAL_BASED_OUTPATIENT_CLINIC_OR_DEPARTMENT_OTHER): Payer: BC Managed Care – PPO | Admitting: Nurse Practitioner

## 2020-08-30 ENCOUNTER — Inpatient Hospital Stay: Payer: BC Managed Care – PPO | Attending: Nurse Practitioner

## 2020-08-30 DIAGNOSIS — Z800 Family history of malignant neoplasm of digestive organs: Secondary | ICD-10-CM | POA: Insufficient documentation

## 2020-08-30 DIAGNOSIS — Z79818 Long term (current) use of other agents affecting estrogen receptors and estrogen levels: Secondary | ICD-10-CM | POA: Diagnosis not present

## 2020-08-30 DIAGNOSIS — G629 Polyneuropathy, unspecified: Secondary | ICD-10-CM | POA: Diagnosis not present

## 2020-08-30 DIAGNOSIS — Z79899 Other long term (current) drug therapy: Secondary | ICD-10-CM | POA: Insufficient documentation

## 2020-08-30 DIAGNOSIS — Z171 Estrogen receptor negative status [ER-]: Secondary | ICD-10-CM

## 2020-08-30 DIAGNOSIS — Z9221 Personal history of antineoplastic chemotherapy: Secondary | ICD-10-CM | POA: Insufficient documentation

## 2020-08-30 DIAGNOSIS — C50411 Malignant neoplasm of upper-outer quadrant of right female breast: Secondary | ICD-10-CM | POA: Diagnosis not present

## 2020-08-30 DIAGNOSIS — I10 Essential (primary) hypertension: Secondary | ICD-10-CM | POA: Diagnosis not present

## 2020-08-30 DIAGNOSIS — Z5111 Encounter for antineoplastic chemotherapy: Secondary | ICD-10-CM | POA: Diagnosis not present

## 2020-08-30 DIAGNOSIS — K219 Gastro-esophageal reflux disease without esophagitis: Secondary | ICD-10-CM | POA: Insufficient documentation

## 2020-08-30 NOTE — Progress Notes (Signed)
[place]  [phone] [phone]  Patient Care Team: [name] as PCP - General   Name of the patient: Brenee Gajda  [id number]  May 21, 1976   Date of visit: 08/30/20  Diagnosis-stage I triple negative breast cancer  Chief complaint/Reason for visit- Initial Meeting for [facility], preparing for starting chemotherapy  Heme/Onc history:  Oncology History Overview Note  # OCT 2021-RIGHT BREAST- TRIPLE NEGATIVE [place]; [US/mammo-32m]; Dr.Cintron; OCT 2021-USThere is a 7 mm mass in the right breast at 10 o'clock, favored to represent a complicated cyst;  No evidence of right axillary lymphadenopathy].   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 The report date is 08/02/2020.  The Multi-Cancer Panel offered by [facility] includes sequencing and/or deletion duplication testing of the following 85 genes: AIP, ALK, APC, ATM, AXIN2,BAP1,  BARD1, BLM, BMPR1A, BRCA1, BRCA2, BRIP1, CASR, CDC73, CDH1, CDK4, CDKN1B, CDKN1C, CDKN2A (p14ARF), CDKN2A (p16INK4a), CEBPA, CHEK2, CTNNA1, DICER1, DIS3L2, EGFR (c.2369C>T, p.Thr790Met variant only), EPCAM (Deletion/duplication testing only), FH, FLCN, GATA2, GPC3, GREM1 (Promoter region deletion/duplication testing only), HOXB13 (c.251G>A, p.Gly84Glu), HRAS, KIT, MAX, MEN1, MET, MITF (c.952G>A, p.Glu318Lys variant only),  MLH1, MSH2, MSH3, MSH6, MUTYH, NBN, NF1, NF2, NTHL1, PALB2, PDGFRA, PHOX2B, PMS2, POLD1, POLE, POT1, PRKAR1A, PTCH1, PTEN, RAD50, RAD51C, RAD51D, RB1, RECQL4, RET, RNF43, RUNX1, SDHAF2, SDHA (sequence changes only), SDHB, SDHC, SDHD, SMAD4, SMARCA4, SMARCB1, SMARCE1, STK11, SUFU, TERC, TERT, TMEM127, TP53, TSC1, TSC2, VHL, WRN and WT1.    09/13/2020 -  Chemotherapy   The patient had dexamethasone (DECADRON) 4 MG tablet, 8 mg, Oral, Daily, 0 of 1 cycle, Start date: --, End date: -- DOXOrubicin (ADRIAMYCIN) chemo injection 86 mg, 60 mg/m2, Intravenous,  Once, 0 of 4 cycles palonosetron (ALOXI) injection 0.25 mg, 0.25 mg, Intravenous,  Once, 0 of 4 cycles pegfilgrastim-cbqv (UDENYCA) injection 6 mg, 6 mg, Subcutaneous, Once, 0 of 4 cycles cyclophosphamide (CYTOXAN) 860 mg in sodium chloride 0.9 % 250 mL chemo infusion, 600 mg/m2, Intravenous,  Once, 0 of 4 cycles PACLitaxel (TAXOL) 114 mg in sodium chloride 0.9 % 250 mL chemo infusion (</= 838mm2), 80 mg/m2, Intravenous,  Once, 0 of 12 cycles fosaprepitant (EMEND) 150 mg in sodium chloride 0.9 % 145 mL IVPB, 150 mg, Intravenous,  Once, 0 of 4 cycles  for chemotherapy treatment.      Interval history-  KaSeirra Kos89ear old female, who presents to chemo care clinic today for initial meeting in preparation for starting chemotherapy. I introduced the chemo care clinic and we discussed that the role of the clinic is to assist those who are at an increased risk of emergency room visits and/or complications during the course of chemotherapy treatment. We discussed that the increased risk takes into account factors such as age, performance status, and co-morbidities. We also discussed that for some, this might include barriers to care such as not having a  primary care provider, lack of insurance/transportation, or not being able to afford medications. We discussed that the goal of the program is to help prevent unplanned ER visits and help reduce  complications during chemotherapy. We do this by discussing specific risk factors to each individual and identifying ways that we can help improve these risk factors and reduce barriers to care.   ECOG FS:0 - Asymptomatic  Review of systems- Review of Systems  Constitutional: Negative for chills, fever, malaise/fatigue and weight loss.  HENT: Negative for hearing loss, nosebleeds, sore throat and tinnitus.   Eyes: Negative for blurred vision and double vision.  Respiratory: Negative for cough, hemoptysis, shortness of breath and wheezing.   Cardiovascular: Negative for chest pain, palpitations and leg swelling.  Gastrointestinal: Negative for abdominal pain, blood in stool, constipation, diarrhea, melena, nausea and vomiting.  Genitourinary: Negative for dysuria and urgency.  Musculoskeletal: Negative for back pain, falls, joint pain and myalgias.  Skin: Negative for itching and rash.  Neurological: Positive for tingling (small fiber neuropathy). Negative for dizziness, sensory change, loss of consciousness, weakness and headaches.  Endo/Heme/Allergies: Negative for environmental allergies. Does not bruise/bleed easily.  Psychiatric/Behavioral: Negative for depression. The patient is nervous/anxious. The patient does not have insomnia.      Anticipated Treatment: adjuvant chemotherapy  No Known Allergies  Past Medical History:  Diagnosis Date  . Breast cancer ([place]) 08/05/2020  . Carcinoma of upper-outer quadrant of right breast in female, estrogen receptor negative ([place]) 07/13/2020  . Complication of anesthesia    hard to wake up-very dizzy feeling like she was going to pass out  . GERD (gastroesophageal reflux disease)    no meds  . H/O foot surgery 08/2018  . History of kidney stones    h/o  . Hypertension   . PONV (postoperative nausea and vomiting)    n/v aftter kidney stone surgery    Past Surgical History:  Procedure Laterality Date  . BREAST BIOPSY Right 07/06/2020    u/s bx Q clip path pending  . BREAST RECONSTRUCTION WITH PLACEMENT OF TISSUE EXPANDER AND FLEX HD (ACELLULAR HYDRATED DERMIS) Right 08/05/2020   Procedure: RIGHT BREAST RECONSTRUCTION WITH PLACEMENT OF TISSUE EXPANDER AND FLEX HD (ACELLULAR HYDRATED DERMIS);  Surgeon: Wallace Going, DO;  Location: [facility] ORS;  Service: Plastics;  Laterality: Right;  . BUNIONECTOMY Right    titanium pin  . CERVICAL CONE BIOPSY    . DILATION AND EVACUATION N/A 03/12/2018   Procedure: DILATATION AND EVACUATION;  Surgeon: [name], MD;  Location: [facility] ORS;  Service: Gynecology;  Laterality: N/A;  . KIDNEY STONE SURGERY    . LAPAROSCOPIC OVARIAN CYSTECTOMY Right 06/07/2019   Procedure: LAPAROSCOPIC OVARIAN CYSTECTOMY;  Surgeon: [name], MD;  Location: [facility] ORS;  Service: Gynecology;  Laterality: Right;  . LAPAROSCOPY N/A 06/07/2019   Procedure: LAPAROSCOPY Luvenia Redden BIOPSIES;  Surgeon: [name], MD;  Location: [facility] ORS;  Service: Gynecology;  Laterality: N/A;  . PORTACATH PLACEMENT Left 08/05/2020   Procedure: INSERTION PORT-A-CATH;  Surgeon: Herbert Pun, MD;  Location: [facility] OR[facility];  Service: General;  Laterality: Left;  . TOTAL MASTECTOMY Right 08/05/2020   Procedure: TOTAL MASTECTOMY w/ Sentinel Node;  Surgeon: Herbert Pun, MD;  Location: [facility] ORS;  Service: General;  Laterality: Right;    Social History   Socioeconomic History  . Marital status: Married    Spouse name: [name]  . Number of children: 0  . Years of education: Not on file  . Highest education level: Not on file  Occupational  History  . Occupation: Market researcher: [employer]  Tobacco Use  . Smoking status: Never Smoker  . Smokeless tobacco: Never Used  Vaping Use  . Vaping Use: Never used  Substance and Sexual Activity  . Alcohol use: Not Currently  . Drug use: Never  . Sexual activity: Yes    Birth control/protection: None  Other Topics Concern  .  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 Lives in [place].    Social Determinants of Health   Financial Resource Strain: Low Risk   . Difficulty of Paying Living Expenses: Not hard at all  Food Insecurity:   . Worried About Charity fundraiser in the Last Year: Not on file  . Ran Out of Food in the Last Year: Not on file  Transportation Needs: No Transportation Needs  . Lack of Transportation (Medical): No  . Lack of Transportation (Non-Medical): No  Physical Activity:   . Days of Exercise per Week: Not on file  . Minutes of Exercise per Session: Not on file  Stress: Stress Concern Present  . Feeling of Stress : Very much  Social Connections:   . Frequency of Communication with Friends and Family: Not on file  . Frequency of Social Gatherings with Friends and Family: Not on file  . Attends Religious Services: Not on file  . Active Member of Clubs or Organizations: Not on file  . Attends Archivist Meetings: Not on file  . Marital Status: Not on file  Intimate Partner Violence:   . Fear of Current or Ex-Partner: Not on file  . Emotionally Abused: Not on file  . Physically Abused: Not on file  . Sexually Abused: Not on file    Family History  Problem Relation Age of Onset  . Cancer Other        great aunt- GI cancer  . Breast cancer Neg Hx      Current Outpatient Medications:  .  acetaminophen (TYLENOL) 500 MG tablet, Take 1 tablet (500 mg total) by mouth every 6 (six) hours as needed. For use AFTER surgery, Disp: 30 tablet, Rfl: 0 .  ALPHA-LIPOIC ACID PO, Take 500 mg by mouth daily. , Disp: , Rfl:  .  amphetamine-dextroamphetamine (ADDERALL XR) 30 MG 24 hr capsule, Take 30 mg by mouth daily. , Disp: , Rfl:  .  DULoxetine (CYMBALTA) 20 MG capsule, Take 2 capsules (40 mg total) by mouth at bedtime., Disp: 60 capsule, Rfl: 2 .  ibuprofen (ADVIL) 200 MG tablet, Take 400 mg by mouth  every 6 (six) hours as needed for headache or moderate pain., Disp: , Rfl:  .  ibuprofen (ADVIL) 600 MG tablet, Take 1 tablet (600 mg total) by mouth every 6 (six) hours as needed for mild pain or moderate pain. For use AFTER surgery, Disp: 30 tablet, Rfl: 0 .  lidocaine-prilocaine (EMLA) cream, Apply 1 application topically as needed. On the port generously; 30-45 mins prior to chemo appt., Disp: 30 g, Rfl: 0 .  lisinopril (ZESTRIL) 5 MG tablet, Take 5 mg by mouth every evening. , Disp: , Rfl:  .  ondansetron (ZOFRAN) 4 MG tablet, Take 1 tablet (4 mg total) by mouth every 8 (eight) hours as needed for nausea or vomiting., Disp: 20 tablet, Rfl: 0 .  ondansetron (ZOFRAN) 8 MG tablet, One pill every 8 hours as needed for nausea/vomitting., Disp: 40 tablet, Rfl:  1 .  pregabalin (LYRICA) 100 MG capsule, Take 1 capsule (100 mg total) by mouth 2 (two) times daily., Disp: 180 capsule, Rfl: 1 .  Probiotic Product (PROBIOTIC ADVANCED PO), Take 1 capsule by mouth daily. , Disp: , Rfl:  .  prochlorperazine (COMPAZINE) 10 MG tablet, Take 1 tablet (10 mg total) by mouth every 6 (six) hours as needed for nausea or vomiting., Disp: 40 tablet, Rfl: 1 .  trolamine salicylate (ASPERCREME) 10 % cream, Apply 1 application topically as needed for muscle pain., Disp: , Rfl:   Physical exam: There were no vitals filed for this visit. Physical Exam Constitutional:      General: She is not in acute distress.    Appearance: She is normal weight. She is not ill-appearing.  HENT:     Head: Normocephalic and atraumatic.  Pulmonary:     Effort: No respiratory distress.     Breath sounds: No wheezing.  Neurological:     Mental Status: She is alert and oriented to person, place, and time.  Psychiatric:        Mood and Affect: Mood is anxious.        Speech: Speech normal.        Behavior: Behavior normal.        Thought Content: Thought content normal.      CMP Latest Ref Rng & Units 06/07/2019  Glucose 70 - 99 mg/dL  113(H)  BUN 6 - 20 mg/dL 16  Creatinine 0.44 - 1.00 mg/dL 0.84  Sodium 135 - 145 mmol/L 137  Potassium 3.5 - 5.1 mmol/L 4.2  Chloride 98 - 111 mmol/L 106  CO2 22 - 32 mmol/L 22  Calcium 8.9 - 10.3 mg/dL 8.3(L)  Total Protein 6.5 - 8.1 g/dL -  Total Bilirubin 0.3 - 1.2 mg/dL -  Alkaline Phos 38 - 126 U/L -  AST 15 - 41 U/L -  ALT 0 - 44 U/L -   CBC Latest Ref Rng & Units 06/07/2019  WBC 4.0 - 10.5 K/uL 11.2(H)  Hemoglobin 12.0 - 15.0 g/dL 12.8  Hematocrit 36 - 46 % 39.0  Platelets 150 - 400 K/uL 214    No images are attached to the encounter.  DG Chest 1 View  Result Date: 08/05/2020 CLINICAL DATA:  Port-A-Cath. EXAM: CHEST  1 VIEW COMPARISON:  None. FINDINGS: There is a grossly well-positioned left-sided Port-A-Cath in place. There is no left-sided pneumothorax. A surgical drain projects over the patient's right chest wall. The lungs are essentially clear without evidence for focal infiltrate or large pleural effusion. There are pockets of subcutaneous gas along the patient's right flank favored to be postsurgical in etiology. There is no acute displaced fracture. The heart size is unremarkable. IMPRESSION: 1. Well-positioned left-sided Port-A-Cath. 2. No pneumothorax. 3. Subcutaneous gas along the patient's right flank favored to be postsurgical in etiology. Electronically Signed   By: Constance Holster M.D.   On: 08/05/2020 20:14   NM Sentinel Node Inj-No Rpt (Breast)  Result Date: 08/05/2020 Sulfur colloid was injected by the nuclear medicine technologist for melanoma sentinel node.   DG C-Arm 1-60 Min-No Report  Result Date: 08/05/2020 Fluoroscopy was utilized by the requesting physician.  No radiographic interpretation.     Assessment and plan- Patient is a 44 y.o. female who presents to [facility] for initial meeting in preparation for starting chemotherapy for the treatment of stage I triple negative breast cancer   1. Stage I triple negative breast cancer of  right breast-  pT1b pN0 s/p mastectomy w/ Dr. Peyton Najjar with reconstruction- Positive for pathogenic variant in [place]. Plan for adjuvant chemotherapy with adriamycin and cytoxan every 2 weeks for 4 cycles followed by weekly taxol x 12. She will received gcsf during treatment for the prevention of febrile neutropenias. In setting of adriamycin, she has a cardiac MUGA scan scheduled for 09/06/20.  Plan to start chemotherapy on 12/17.   2. Chemo Care Clinic/High Risk for ER/Hospitalization during chemotherapy- We discussed the role of the chemo care clinic and identified patient specific risk factors. I discussed that patient was identified as low risk. Recent hospitalization related to cancer surgery.   3. Social Determinants of Health- we discussed that social determinants of health may have significant impacts on health and outcomes for cancer patients.  Today we discussed specific social determinants of performance status, alcohol use, depression, financial needs, food insecurity, housing, interpersonal violence, social connections, stress, tobacco use, and transportation.  We extensively discussed programs and services available through the cancer center including but not limited to outpatient occupational therapy, care program, counseling services, palliative medicine, symptom management clinic, social work, Public librarian, Building surveyor, support groups, nurse navigation, dietitian, smoking cessation, and transportation assistance. She is interested in support groups and possibly mentor program. She declines other services at this time  4. Fertility & Birth Control- She is G1P0 w/ history of endometriosis and hx of right ovarian cystectomy. She is fertility desiring. Dr. Rogue Bussing has planned for patient to receive triptorelin/Trelstar prior to treatment. Currently scheduled for 09/03/20. We discussed rationale for use and risks vs benefit. Dr. Leafy Ro is ob/gyn. I offered referral to Reproductive  Endocrinology and Infertility ([facility]) but patient would like to speak to her husband. Could consider harvesting eggs prior to treatment but this may cause delay in initiating treatment.   5. Small fiber neuropathy- patient concerned of effect of chemotherapy on her existing neuropathic pain. She is followed by Dr. Manuella Ghazi and Dr. Holley Raring. Currently on cymbalta and lyrica. Will monitor closely during chemotherapy and asked her to notify us if worsening of symptoms.   We also discussed the role of the Symptom Management Clinic at [facility] for acute issues and methods of contacting clinic/provider. She denies needing other specific assistance at this time and She will be followed by Tanya Nones, RN (Nurse Navigator).   Visit Diagnosis 1. Carcinoma of upper-outer quadrant of right breast in female, estrogen receptor negative ([place])    Patient expressed understanding and was in agreement with this plan. She also understands that She can call clinic at any time with any questions, concerns, or complaints.   A total of (45) minutes of face-to-face time was spent with this patient with greater than 50% of that time in counseling and care-coordination.  Beckey Rutter, DNP, AGNP-C [facility] [place]: Dr. Rogue Bussing & Dr. Leafy Ro

## 2020-08-30 NOTE — Progress Notes (Signed)
Nutrition Assessment   Reason for Assessment:   Questions regarding nutrition, new breast cancer.   ASSESSMENT:  44 year old female with new right triple negative breast cancer.  Patient s/p right mastectomy, GERD, HTN.  Planning to start chemotherapy on 12/17.    Met with patient in clinic today.  Patient reports normal/good appetite.  Typically eats 3 meals per day.  Breakfast has been overnight oats with peanut butter and almond milk.  Lunch is usually leftovers from night before.  Supper is meat (chicken, vegetables (wide varitey), rice/sometimes potatos, sweet potato, quinoa.  Sometimes will have baked spaghetti, tacos.    Reports prior to diagnosis was training for 5K.     Medications: zofran, compazine, probiotic   Labs: reviewed   Anthropometrics:   Height: 62 inches Weight: 104 lb 2 oz today UBW: 98 lb BMI: 18   Estimated Energy Needs  Kcals: 1400-1600 Protein: 70-82 g Fluid: 1.6 L   NUTRITION DIAGNOSIS: Food and nutrition related knowledge deficit related to new diagnosis of breast cancer as evidenced by questions regarding nutrition during treatment and overall health     INTERVENTION:  Discussed current nutrition recommendations from [place] and provided handout.  Encouraged weight maintenance during treatment. Discussed strategies to help with nausea.  Handout provided.  Encouraged good sources of protein ideally at every meal/snack. Contact information provided  MONITORING, EVALUATION, GOAL: weight trends, intake   Next Visit: Dec 30 phone call  Charlsie Fleeger B. Zenia Resides, [place], [address] Registered Dietitian [phone] (mobile)

## 2020-08-31 ENCOUNTER — Encounter: Payer: Self-pay | Admitting: Plastic Surgery

## 2020-08-31 ENCOUNTER — Other Ambulatory Visit: Payer: Self-pay

## 2020-08-31 ENCOUNTER — Ambulatory Visit (INDEPENDENT_AMBULATORY_CARE_PROVIDER_SITE_OTHER): Payer: BC Managed Care – PPO | Admitting: Plastic Surgery

## 2020-08-31 VITALS — BP 118/82 | HR 98 | Temp 98.0°F

## 2020-08-31 DIAGNOSIS — Z9010 Acquired absence of unspecified breast and nipple: Secondary | ICD-10-CM | POA: Insufficient documentation

## 2020-08-31 DIAGNOSIS — Z171 Estrogen receptor negative status [ER-]: Secondary | ICD-10-CM

## 2020-08-31 DIAGNOSIS — C50411 Malignant neoplasm of upper-outer quadrant of right female breast: Secondary | ICD-10-CM

## 2020-08-31 DIAGNOSIS — Z9011 Acquired absence of right breast and nipple: Secondary | ICD-10-CM

## 2020-08-31 NOTE — Progress Notes (Signed)
   Subjective:    Patient ID: Beth Wells, female    DOB: 1975/12/05, 44 y.o.   MRN: [id number]  The patient is a 44 year old female here for follow-up on her right breast reconstruction.  She had a right mastectomy with expander placed.  She has a 350 cc expander.  The incisions are healing nicely.  There is no sign of seroma or hematoma.  The drain output is minimal.  The patient's husband joined [place] by phone.     Review of Systems  Constitutional: Negative.   HENT: Negative.   Eyes: Negative.   Respiratory: Negative.   Cardiovascular: Negative.   Genitourinary: Negative.        Objective:   Physical Exam Vitals and nursing note reviewed.  Constitutional:      Appearance: Normal appearance.  HENT:     Head: Normocephalic and atraumatic.  Cardiovascular:     Rate and Rhythm: Normal rate.     Pulses: Normal pulses.  Neurological:     General: No focal deficit present.     Mental Status: She is alert. Mental status is at baseline.  Psychiatric:        Mood and Affect: Mood normal.        Behavior: Behavior normal.        Assessment & Plan:     ICD-10-CM   1. Carcinoma of upper-outer quadrant of right breast in female, estrogen receptor negative ([place])  C50.411    Z17.1   2. Acquired absence of right breast  Z90.11    The drain was removed.  The patient will start chemotherapy in the next week.  We placed injectable saline in the Expander using a sterile technique: Right: 30 cc for a total of 210 / 350 cc

## 2020-09-01 ENCOUNTER — Telehealth: Payer: Self-pay

## 2020-09-01 NOTE — Telephone Encounter (Signed)
Patient called to state she has had a low grade fever of 99.5 with chills and body aches since 2am this morning. She would like a call back.

## 2020-09-02 ENCOUNTER — Encounter: Payer: Self-pay | Admitting: Nurse Practitioner

## 2020-09-02 NOTE — Telephone Encounter (Signed)
Called and spoke with the patient on yesterday regarding the message below.  Asked the patient how she was doing.    She stated that she's not sure if she still has a fever.  She's not having chills, and she has been using a heating pad and blankets.  Informed the patient to be careful using the heating pad because it could cause burning.    She stated the (R) breast looks okay, but it's very sore.   Called the patient back and informed her that I spoke with Dr. Marla Roe regarding her message, and she stated to make sure you are drinking fluids, take Motrin for the pain and fever, and if she feels she needs to come in for office visit she can schedule an appointment for Thurs or Friday of this week.    Patient verbalized understanding and stated that her fever was (100.2) 30 mins ago.  She stated that she wants to wait and see how she does.  Asked the patient to please give [place] a call back to schedule an appointment.  She stated she will see how she is.//AB/CMA

## 2020-09-03 ENCOUNTER — Telehealth: Payer: Self-pay | Admitting: *Deleted

## 2020-09-03 ENCOUNTER — Ambulatory Visit: Payer: BC Managed Care – PPO

## 2020-09-03 ENCOUNTER — Ambulatory Visit: Payer: BC Managed Care – PPO | Admitting: Internal Medicine

## 2020-09-03 ENCOUNTER — Inpatient Hospital Stay: Payer: BC Managed Care – PPO

## 2020-09-03 ENCOUNTER — Encounter: Payer: Self-pay | Admitting: *Deleted

## 2020-09-03 ENCOUNTER — Other Ambulatory Visit: Payer: BC Managed Care – PPO

## 2020-09-03 DIAGNOSIS — Z171 Estrogen receptor negative status [ER-]: Secondary | ICD-10-CM

## 2020-09-03 DIAGNOSIS — C50411 Malignant neoplasm of upper-outer quadrant of right female breast: Secondary | ICD-10-CM

## 2020-09-03 NOTE — Telephone Encounter (Signed)
Dr. Rogue Bussing contacted the patient to discuss her concerns for the zoladex injection as well as a reproductive endocrinology referral. Patient interested in referral to [facility] for egg retrieval. She declines zoladex injection today.   I spoke with Amy with [facility]. She will speak to Dr. Nori Riis to see if a virtual visit could be arranged given the urgency of starting chemotherapy. [facility] requested demographics and records to be faxed to [phone]. [facility] will reach out to the patient re: this referral. Hand off provided to Amy regarding the patient's care. Patient was scheduled to start Zoladex today. Patient concerned about side effects of acne and enlarged breast and hot flashes. She cnl her Zoladex and requested referral to Rep. Endocrinology to help with decision making process for egg retrieval. Patient aware that egg retrieval process may take some time to 'harvest the eggs.' Dr. Rogue Bussing informed patient that he would not want to delay her chemotherapy-given the aggressive histology.

## 2020-09-03 NOTE — Telephone Encounter (Signed)
Received incoming call from Amy at [facility]. The Office is not currently able to perform any egg retrievals due to an office move until January. She will have Bonney Aid, an oncology infertility RN navigator, personally reach out to the patient. [employer] email is [email or web address] (phone [phone]). Kennyth Lose will discuss transferring the referral to [facility], whom may be able to get the patient in sooner.  mychart message sent to patient to update her on the referral

## 2020-09-03 NOTE — Telephone Encounter (Signed)
Following msg received from [facility]. I personally faxed the records to [facility] after receiving this message.   Hi Maryanne Huneycutt,  I am the oncofertility patient navigator at [facility]. Amy sent me the REI referral you faxed over for Mrs. Reesman this afternoon, thank you for sending! Unfortunately, [facility] is in a downtime currently for a move and we cannot accommodate fertility preservation appointments at this time. We are referring our urgent fertility cases to [facility]- I have already spoken with their physician and asked them to contact the patient for an urgent appointment. They may need her medical records- their fax # is [phone].  Thank you very much and please let me know if you have any questions!  Janine Limbo, BSN, RN  Patient TEFL teacher Phone: [phone] Pager: [phone]

## 2020-09-06 ENCOUNTER — Other Ambulatory Visit: Payer: Self-pay

## 2020-09-06 ENCOUNTER — Encounter
Admission: RE | Admit: 2020-09-06 | Discharge: 2020-09-06 | Disposition: A | Discharge status: Home / Self Care | Payer: BC Managed Care – PPO | Source: Ambulatory Visit | Attending: Internal Medicine | Admitting: Internal Medicine

## 2020-09-06 DIAGNOSIS — Z5111 Encounter for antineoplastic chemotherapy: Secondary | ICD-10-CM | POA: Diagnosis present

## 2020-09-06 DIAGNOSIS — C50411 Malignant neoplasm of upper-outer quadrant of right female breast: Secondary | ICD-10-CM | POA: Insufficient documentation

## 2020-09-06 DIAGNOSIS — Z171 Estrogen receptor negative status [ER-]: Secondary | ICD-10-CM | POA: Insufficient documentation

## 2020-09-06 MED ORDER — TECHNETIUM TC 99M-LABELED RED BLOOD CELLS IV KIT
20.0000 | PACK | Freq: Once | INTRAVENOUS | Status: AC | PRN
  Administered 2020-09-06: 12:00:00 20.94 via INTRAVENOUS

## 2020-09-07 ENCOUNTER — Inpatient Hospital Stay: Payer: BC Managed Care – PPO

## 2020-09-07 ENCOUNTER — Telehealth: Payer: Self-pay | Admitting: Internal Medicine

## 2020-09-07 DIAGNOSIS — C50411 Malignant neoplasm of upper-outer quadrant of right female breast: Secondary | ICD-10-CM

## 2020-09-07 MED ORDER — GOSERELIN ACETATE 3.6 MG ~~LOC~~ IMPL
3.6000 mg | DRUG_IMPLANT | Freq: Once | SUBCUTANEOUS | Status: AC
  Administered 2020-09-07: 15:00:00 3.6 mg via SUBCUTANEOUS
  Filled 2020-09-07: qty 3.6

## 2020-09-07 NOTE — Telephone Encounter (Signed)
On 12/13-spoke to Dr. Edilia Bo; reproductive- Endocrinologist regarding patient's recent appointment for fertility preservation prior to chemotherapy.  As per Dr. Edilia Bo patient has a chance of viable pregnancy of 9 to 10% with current techniques.  And it would take at least January 1/second week harvest eggs, and until which chemotherapy has to be held.  Also financial/insurance issues still pending.   Discussed that in general would recommend starting adjuvant chemotherapy within 4 weeks of surgery.  However, at the same time patient has stage I tumor; and not to stage II or III where the risk of recurrence is fairly high.  At the end of the day, this patient's individual choice to make a decision at this time.  On 12/14-left a voicemail for the patient to call us back to discuss above.   GB

## 2020-09-09 ENCOUNTER — Other Ambulatory Visit: Payer: Self-pay | Admitting: *Deleted

## 2020-09-09 DIAGNOSIS — Z171 Estrogen receptor negative status [ER-]: Secondary | ICD-10-CM

## 2020-09-10 ENCOUNTER — Inpatient Hospital Stay: Payer: BC Managed Care – PPO

## 2020-09-10 ENCOUNTER — Other Ambulatory Visit: Payer: Self-pay

## 2020-09-10 ENCOUNTER — Encounter: Payer: Self-pay | Admitting: *Deleted

## 2020-09-10 ENCOUNTER — Inpatient Hospital Stay (HOSPITAL_BASED_OUTPATIENT_CLINIC_OR_DEPARTMENT_OTHER): Payer: BC Managed Care – PPO | Admitting: Internal Medicine

## 2020-09-10 VITALS — HR 100

## 2020-09-10 DIAGNOSIS — C50411 Malignant neoplasm of upper-outer quadrant of right female breast: Secondary | ICD-10-CM

## 2020-09-10 DIAGNOSIS — Z171 Estrogen receptor negative status [ER-]: Secondary | ICD-10-CM | POA: Diagnosis not present

## 2020-09-10 LAB — COMPREHENSIVE METABOLIC PANEL
ALT: 11 U/L (ref 0–44)
AST: 18 U/L (ref 15–41)
Albumin: 3.9 g/dL (ref 3.5–5.0)
Alkaline Phosphatase: 43 U/L (ref 38–126)
Anion gap: 10 (ref 5–15)
BUN: 27 mg/dL — ABNORMAL HIGH (ref 6–20)
CO2: 22 mmol/L (ref 22–32)
Calcium: 9.2 mg/dL (ref 8.9–10.3)
Chloride: 104 mmol/L (ref 98–111)
Creatinine, Ser: 0.64 mg/dL (ref 0.44–1.00)
GFR, Estimated: >60 mL/min (ref >60)
Glucose, Bld: 106 mg/dL — ABNORMAL HIGH (ref 70–99)
Potassium: 4 mmol/L (ref 3.5–5.1)
Sodium: 136 mmol/L (ref 135–145)
Total Bilirubin: 0.4 mg/dL (ref 0.3–1.2)
Total Protein: 7 g/dL (ref 6.5–8.1)

## 2020-09-10 LAB — CBC WITH DIFFERENTIAL/PLATELET
Abs Immature Granulocytes: 0.03 10*3/uL (ref 0.00–0.07)
Basophils Absolute: 0 10*3/uL (ref 0.0–0.1)
Basophils Relative: 1 %
Eosinophils Absolute: 0.5 10*3/uL (ref 0.0–0.5)
Eosinophils Relative: 7 %
HCT: 33.5 % — ABNORMAL LOW (ref 36.0–46.0)
Hemoglobin: 11.7 g/dL — ABNORMAL LOW (ref 12.0–15.0)
Immature Granulocytes: 0 %
Lymphocytes Relative: 26 %
Lymphs Abs: 2 10*3/uL (ref 0.7–4.0)
MCH: 31.5 pg (ref 26.0–34.0)
MCHC: 34.9 g/dL (ref 30.0–36.0)
MCV: 90.1 fL (ref 80.0–100.0)
Monocytes Absolute: 0.4 10*3/uL (ref 0.1–1.0)
Monocytes Relative: 6 %
Neutro Abs: 4.6 10*3/uL (ref 1.7–7.7)
Neutrophils Relative %: 60 %
Platelets: 292 10*3/uL (ref 150–400)
RBC: 3.72 MIL/uL — ABNORMAL LOW (ref 3.87–5.11)
RDW: 11.7 % (ref 11.5–15.5)
WBC: 7.6 10*3/uL (ref 4.0–10.5)
nRBC: 0 % (ref 0.0–0.2)

## 2020-09-10 MED ORDER — PALONOSETRON HCL INJECTION 0.25 MG/5ML
0.2500 mg | Freq: Once | INTRAVENOUS | Status: AC
  Administered 2020-09-10: 10:00:00 0.25 mg via INTRAVENOUS
  Filled 2020-09-10: qty 5

## 2020-09-10 MED ORDER — SODIUM CHLORIDE 0.9 % IV SOLN
10.0000 mg | Freq: Once | INTRAVENOUS | Status: AC
  Administered 2020-09-10: 10:00:00 10 mg via INTRAVENOUS
  Filled 2020-09-10: qty 10

## 2020-09-10 MED ORDER — HEPARIN SOD (PORK) LOCK FLUSH 100 UNIT/ML IV SOLN
INTRAVENOUS | Status: AC
  Filled 2020-09-10: qty 5

## 2020-09-10 MED ORDER — HEPARIN SOD (PORK) LOCK FLUSH 100 UNIT/ML IV SOLN
500.0000 [IU] | Freq: Once | INTRAVENOUS | Status: AC | PRN
  Administered 2020-09-10: 12:00:00 500 [IU]
  Filled 2020-09-10: qty 5

## 2020-09-10 MED ORDER — SODIUM CHLORIDE 0.9 % IV SOLN
Freq: Once | INTRAVENOUS | Status: AC
  Filled 2020-09-10: qty 250

## 2020-09-10 MED ORDER — SODIUM CHLORIDE 0.9 % IV SOLN
600.0000 mg/m2 | Freq: Once | INTRAVENOUS | Status: AC
  Administered 2020-09-10: 12:00:00 860 mg via INTRAVENOUS
  Filled 2020-09-10: qty 43

## 2020-09-10 MED ORDER — DOXORUBICIN HCL CHEMO IV INJECTION 2 MG/ML
60.0000 mg/m2 | Freq: Once | INTRAVENOUS | Status: AC
  Administered 2020-09-10: 11:00:00 86 mg via INTRAVENOUS
  Filled 2020-09-10: qty 25

## 2020-09-10 MED ORDER — SODIUM CHLORIDE 0.9 % IV SOLN
150.0000 mg | Freq: Once | INTRAVENOUS | Status: AC
  Administered 2020-09-10: 11:00:00 150 mg via INTRAVENOUS
  Filled 2020-09-10: qty 150

## 2020-09-10 NOTE — Assessment & Plan Note (Addendum)
#   Stage I triple negative breast cancer; proceed with AC- Taxol chemo.   # proceed with cycle #1 AC today. Labs today reviewed;  acceptable for treatment today.   #Again reviewed the potential side effects of chemotherapy; MUGA scan normal ejection fraction.  #Small fiber neuropathy- [Dr.Shah]-currently on Cymbalta/Lyrica.  Monitor closely while on Taxol.  No major concerns on current chemotherapy.  #Fertility preserving-s/p evaluation with reproductive endocrinology.  Given the timing; 9 to 10% chance of fertility; and the financial constraints-patient decided to forego egg harvesting.  Proceed with Lupron on a monthly basis [first shot on 12/14];# 2 due on 12/11.   # DISPOSITION:  # chemo today # follow up on Jan 3rd-X- MD; labs- cbc/cmp; Adriamycin- Cytoxan; fluphila-d-2; -Dr.B

## 2020-09-10 NOTE — Progress Notes (Signed)
Pt received first time [facility] treatment today. Tolerated well. Ambulatory at discharge.

## 2020-09-10 NOTE — Progress Notes (Signed)
one [name], [name], [name]  CHIEF COMPLAINTS/PURPOSE OF CONSULTATION: Breast cancer  #  Oncology History Overview Note  # OCT 2021-RIGHT BREAST- TRIPLE NEGATIVE [place]; [US/mammo-51m]; Dr.Cintron; OCT 2021-USThere is a 7 mm mass in the right breast at 10 o'clock, favored to represent a complicated cyst;  No evidence of right axillary lymphadenopathy].   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 [Dr.Shah]- skin biopsy- on cymblata+ Lyrica  # DEC 2021-status post evaluation with reproductive endocrinology [9-10% chance of viable pregnancy] # # SURVIVORSHIP:   # GENETICS: BARD  DIAGNOSIS: Breast cancer-triple negative  STAGE:  I       ;  GOALS: cure  CURRENT/MOST RECENT THERAPY : AC-T    Carcinoma of upper-outer quadrant of right breast in female, estrogen receptor negative ([place]  07/13/2020 Initial Diagnosis   Carcinoma of upper-outer quadrant of right breast in female, estrogen receptor negative ([place]    Genetic Testing   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 The report date is 08/02/2020.  The Multi-Cancer Panel offered by [facility] includes sequencing and/or deletion duplication testing of the following 85 genes: AIP, ALK, APC, ATM, AXIN2,BAP1,  BARD1, BLM, BMPR1A, BRCA1, BRCA2, BRIP1, CASR, CDC73, CDH1, CDK4, CDKN1B, CDKN1C, CDKN2A (p14ARF), CDKN2A (p16INK4a), CEBPA, CHEK2, CTNNA1, DICER1, DIS3L2, EGFR (c.2369C>T, p.Thr790Met variant only), EPCAM (Deletion/duplication testing only), FH, FLCN, GATA2, GPC3, GREM1 (Promoter region deletion/duplication testing only), HOXB13 (c.251G>A, p.Gly84Glu), HRAS, KIT, MAX, MEN1, MET, MITF (c.952G>A, p.Glu318Lys variant only), MLH1, MSH2, MSH3, MSH6, MUTYH, NBN, NF1, NF2, NTHL1,  PALB2, PDGFRA, PHOX2B, PMS2, POLD1, POLE, POT1, PRKAR1A, PTCH1, PTEN, RAD50, RAD51C, RAD51D, RB1, RECQL4, RET, RNF43, RUNX1, SDHAF2, SDHA (sequence changes only), SDHB, SDHC, SDHD, SMAD4, SMARCA4, SMARCB1, SMARCE1, STK11, SUFU, TERC, TERT, TMEM127, TP53, TSC1, TSC2, VHL, WRN and WT1.    09/10/2020 -  Chemotherapy   The patient had dexamethasone (DECADRON) 4 MG tablet, 8 mg, Oral, Daily, 1 of 1 cycle, Start date: --, End date: -- DOXOrubicin (ADRIAMYCIN) chemo injection 86 mg, 60 mg/m2 = 86 mg, Intravenous,  Once, 1 of 4 cycles Administration: 86 mg (09/10/2020) palonosetron (ALOXI) injection 0.25 mg, 0.25 mg, Intravenous,  Once, 1 of 4 cycles Administration: 0.25 mg (09/10/2020) pegfilgrastim-cbqv (UDENYCA) injection 6 mg, 6 mg, Subcutaneous, Once, 1 of 4 cycles cyclophosphamide (CYTOXAN) 860 mg in sodium chloride 0.9 % 250 mL chemo infusion, 600 mg/m2 = 860 mg, Intravenous,  Once, 1 of 4 cycles Administration: 860 mg (09/10/2020) PACLitaxel (TAXOL) 114 mg in sodium chloride 0.9 % 250 mL chemo infusion (</= 849mm2), 80 mg/m2, Intravenous,  Once, 0 of 12 cycles fosaprepitant (EMEND) 150 mg in sodium chloride 0.9 % 145 mL IVPB, 150 mg, Intravenous,  Once, 1 of 4 cycles Administration: 150 mg (09/10/2020)  for chemotherapy treatment.       HISTORY OF PRESENTING ILLNESS:  Beth Angel[age].o.  female   with early stage triple negative breast cancer is here for follow-up.  The patient was evaluated by reproductive endocrinology-patient has decided to proceed with Lupron-for ovarian protection.  Denies any unusual symptoms at this time.  Review of Systems  Constitutional: Negative for chills, diaphoresis, fever, malaise/fatigue and weight loss.  HENT: Negative for nosebleeds and sore throat.   Eyes: Negative for double vision.  Respiratory: Negative for cough, hemoptysis, sputum  production, shortness of breath and wheezing.   Cardiovascular: Negative for chest pain, palpitations, orthopnea  and leg swelling.  Gastrointestinal: Negative for abdominal pain, blood in stool, constipation, diarrhea, heartburn, melena, nausea and vomiting.  Genitourinary: Negative for dysuria, frequency and urgency.  Musculoskeletal: Negative for back pain and joint pain.  Skin: Negative.  Negative for itching and rash.  Neurological: Positive for tingling. Negative for dizziness, focal weakness, weakness and headaches.  Endo/Heme/Allergies: Does not bruise/bleed easily.  Psychiatric/Behavioral: Negative for depression. The patient is not nervous/anxious and does not have insomnia.      MEDICAL HISTORY:  Past Medical History:  Diagnosis Date  . Breast cancer ([place]) 08/05/2020  . Carcinoma of upper-outer quadrant of right breast in female, estrogen receptor negative ([place]) 07/13/2020  . Complication of anesthesia    hard to wake up-very dizzy feeling like she was going to pass out  . GERD (gastroesophageal reflux disease)    no meds  . H/O foot surgery 08/2018  . History of kidney stones    h/o  . Hypertension   . PONV (postoperative nausea and vomiting)    n/v aftter kidney stone surgery    SURGICAL HISTORY: Past Surgical History:  Procedure Laterality Date  . BREAST BIOPSY Right 07/06/2020   u/s bx Q clip path pending  . BREAST RECONSTRUCTION WITH PLACEMENT OF TISSUE EXPANDER AND FLEX HD (ACELLULAR HYDRATED DERMIS) Right 08/05/2020   Procedure: RIGHT BREAST RECONSTRUCTION WITH PLACEMENT OF TISSUE EXPANDER AND FLEX HD (ACELLULAR HYDRATED DERMIS);  Surgeon: Wallace Going, DO;  Location: [facility] OR[facility];  Service: Plastics;  Laterality: Right;  . BUNIONECTOMY Right    titanium pin  . CERVICAL CONE BIOPSY    . DILATION AND EVACUATION N/A 03/12/2018   Procedure: DILATATION AND EVACUATION;  Surgeon: [name], MD;  Location: [facility] OR[facility];  Service: Gynecology;  Laterality: N/A;  . KIDNEY STONE SURGERY    . LAPAROSCOPIC OVARIAN CYSTECTOMY Right 06/07/2019   Procedure: LAPAROSCOPIC OVARIAN  CYSTECTOMY;  Surgeon: [name], MD;  Location: [facility] OR[facility];  Service: Gynecology;  Laterality: Right;  . LAPAROSCOPY N/A 06/07/2019   Procedure: LAPAROSCOPY Luvenia Redden BIOPSIES;  Surgeon: [name], MD;  Location: [facility];  Service: Gynecology;  Laterality: N/A;  . PORTACATH PLACEMENT Left 08/05/2020   Procedure: INSERTION PORT-A-CATH;  Surgeon: Herbert Pun, MD;  Location: [facility];  Service: General;  Laterality: Left;  . TOTAL MASTECTOMY Right 08/05/2020   Procedure: TOTAL MASTECTOMY w/ Sentinel Node;  Surgeon: Herbert Pun, MD;  Location: [facility];  Service: General;  Laterality: Right;    SOCIAL HISTORY: Social History   Socioeconomic History  . Marital status: Married    Spouse name: [name]  . Number of children: 0  . Years of education: Not on file  . Highest education level: Not on file  Occupational History  . Occupation: Market researcher: [employer]  Tobacco Use  . Smoking status: Never Smoker  . Smokeless tobacco: Never Used  Vaping Use  . Vaping Use: Never used  Substance and Sexual Activity  . Alcohol use: Not Currently  . Drug use: Never  . Sexual activity: Yes    Birth control/protection: None  Other Topics Concern  . 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 Lives in [place].    Social Determinants of Health   Financial Resource Strain: Low Risk   . Difficulty of Paying Living Expenses: Not hard at  all  Food Insecurity: Not on file  Transportation Needs: No Transportation Needs  . Lack of Transportation (Medical): No  . Lack of Transportation (Non-Medical): No  Physical Activity: Not on file  Stress: Stress Concern Present  . Feeling of Stress : Very much  Social Connections: Not on file  Intimate Partner Violence: Not on file    FAMILY HISTORY: Family History  Problem Relation Age of Onset  . Cancer  Other        great aunt- GI cancer  . Breast cancer Neg Hx     ALLERGIES:  has No Known Allergies.  MEDICATIONS:  Current Outpatient Medications  Medication Sig Dispense Refill  . acetaminophen (TYLENOL) 500 MG tablet Take 1 tablet (500 mg total) by mouth every 6 (six) hours as needed. For use AFTER surgery 30 tablet 0  . ALPHA-LIPOIC ACID PO Take 500 mg by mouth daily.     Marland Kitchen amphetamine-dextroamphetamine (ADDERALL XR) 30 MG 24 hr capsule Take 30 mg by mouth daily.     . DULoxetine (CYMBALTA) 20 MG capsule Take 2 capsules (40 mg total) by mouth at bedtime. (Patient taking differently: Take 20 mg by mouth at bedtime.) 60 capsule 2  . ibuprofen (ADVIL) 600 MG tablet Take 1 tablet (600 mg total) by mouth every 6 (six) hours as needed for mild pain or moderate pain. For use AFTER surgery 30 tablet 0  . lidocaine-prilocaine (EMLA) cream Apply 1 application topically as needed. On the port generously; 30-45 mins prior to chemo appt. 30 g 0  . lisinopril (ZESTRIL) 5 MG tablet Take 5 mg by mouth every evening.     . ondansetron (ZOFRAN) 8 MG tablet One pill every 8 hours as needed for nausea/vomitting. 40 tablet 1  . pregabalin (LYRICA) 100 MG capsule Take 1 capsule (100 mg total) by mouth 2 (two) times daily. 180 capsule 1  . Probiotic Product (PROBIOTIC ADVANCED PO) Take 1 capsule by mouth daily.     . prochlorperazine (COMPAZINE) 10 MG tablet Take 1 tablet (10 mg total) by mouth every 6 (six) hours as needed for nausea or vomiting. 40 tablet 1  . sodium fluoride (FLUORISHIELD) 1.1 % GEL dental gel     . trolamine salicylate (ASPERCREME) 10 % cream Apply 1 application topically as needed for muscle pain.     No current facility-administered medications for this visit.      Marland Kitchen  PHYSICAL EXAMINATION: ECOG PERFORMANCE STATUS: 0 - Asymptomatic  Vitals:   09/10/20 0908  BP: 120/84  Pulse: (!) 102  Resp: 20  Temp: 98 F (36.7 C)  SpO2: 98%   Filed Weights   09/10/20 0908  Weight: 102  lb 3.2 oz (46.4 kg)    Physical Exam Constitutional:      Comments: Accompanied by husband.  HENT:     Head: Normocephalic and atraumatic.     Mouth/Throat:     Pharynx: No oropharyngeal exudate.  Eyes:     Pupils: Pupils are equal, round, and reactive to light.  Cardiovascular:     Rate and Rhythm: Normal rate and regular rhythm.  Pulmonary:     Effort: Pulmonary effort is normal. No respiratory distress.     Breath sounds: Normal breath sounds. No wheezing.  Abdominal:     General: Bowel sounds are normal. There is no distension.     Palpations: Abdomen is soft. There is no mass.     Tenderness: There is no abdominal tenderness. There is no guarding or rebound.  Musculoskeletal:        General: No tenderness. Normal range of motion.     Cervical back: Normal range of motion and neck supple.  Skin:    General: Skin is warm.  Neurological:     Mental Status: She is alert and oriented to person, place, and time.  Psychiatric:        Mood and Affect: Affect normal.      LABORATORY DATA:  I have reviewed the data as listed Lab Results  Component Value Date   WBC 7.6 09/10/2020   HGB 11.7 (L) 09/10/2020   HCT 33.5 (L) 09/10/2020   MCV 90.1 09/10/2020   PLT 292 09/10/2020   Recent Labs    09/10/20 0846  NA 136  K 4.0  CL 104  CO2 22  GLUCOSE 106*  BUN 27*  CREATININE 0.64  CALCIUM 9.2  GFRNONAA >60  PROT 7.0  ALBUMIN 3.9  AST 18  ALT 11  ALKPHOS 43  BILITOT 0.4    RADIOGRAPHIC STUDIES: I have personally reviewed the radiological images as listed and agreed with the findings in the report. NM Cardiac Muga Rest  Result Date: 09/06/2020 CLINICAL DATA:  RIGHT breast cancer, encounter for anti neoplastic chemotherapy EXAM: NUCLEAR MEDICINE CARDIAC BLOOD POOL IMAGING (MUGA) TECHNIQUE: Cardiac multi-gated acquisition was performed at rest following intravenous injection of Tc-94mlabeled red blood cells. RADIOPHARMACEUTICALS:  20.94 mCi Tc-975mertechnetate  in-vitro labeled red blood cells IV COMPARISON:  None FINDINGS: Calculated LEFT ventricular ejection fraction is 58.5%, normal. Study was obtained at a cardiac rate of 77 bpm. Patient was rhythmic during imaging. Cine analysis of the LEFT ventricle in 3 projections demonstrates normal LEFT ventricular wall motion. IMPRESSION: Normal LEFT ventricular ejection fraction of 58.5%. Normal LV wall motion. Electronically Signed   By: MaLavonia Dana.D.   On: 09/06/2020 15:48    ASSESSMENT & PLAN:   Carcinoma of upper-outer quadrant of right breast in female, estrogen receptor negative ([place]# Stage I triple negative breast cancer; proceed with AC- Taxol chemo.   # proceed with cycle #1 AC today. Labs today reviewed;  acceptable for treatment today.   #Again reviewed the potential side effects of chemotherapy; MUGA scan normal ejection fraction.  #Small fiber neuropathy- [Dr.Shah]-currently on Cymbalta/Lyrica.  Monitor closely while on Taxol.  No major concerns on current chemotherapy.  #Fertility preserving-s/p evaluation with reproductive endocrinology.  Given the timing; 9 to 10% chance of fertility; and the financial constraints-patient decided to forego egg harvesting.  Proceed with Lupron on a monthly basis [first shot on 12/14];# 2 due on 12/11.   # DISPOSITION:  # chemo today # follow up on Jan 3rd-X- MD; labs- cbc/cmp; Adriamycin- Cytoxan; fluphila-d-2; -Dr.B   All questions were answered. The patient/family knows to call the clinic with any problems, questions or concerns.    GoCammie SickleMD 09/12/2020 8:30 PM

## 2020-09-13 ENCOUNTER — Inpatient Hospital Stay: Payer: BC Managed Care – PPO

## 2020-09-13 ENCOUNTER — Other Ambulatory Visit: Payer: Self-pay

## 2020-09-13 ENCOUNTER — Telehealth: Payer: Self-pay

## 2020-09-13 DIAGNOSIS — C50411 Malignant neoplasm of upper-outer quadrant of right female breast: Secondary | ICD-10-CM | POA: Diagnosis not present

## 2020-09-13 MED ORDER — PEGFILGRASTIM-CBQV 6 MG/0.6ML ~~LOC~~ SOSY
6.0000 mg | PREFILLED_SYRINGE | Freq: Once | SUBCUTANEOUS | Status: AC
  Administered 2020-09-13: 10:00:00 6 mg via SUBCUTANEOUS
  Filled 2020-09-13: qty 0.6

## 2020-09-13 NOTE — Telephone Encounter (Signed)
Telephone call to patient for follow up after receiving first infusion.   Patient states infusion went great.  States was having some nausea but anti-nausea meds helped.  States drinking plenty of fluids.   States has not moved bowels in 4 days.  Encouraged pt to take Miralax 2 or 3 times a day.  Encouraged patient to call for any concerns or questions.

## 2020-09-14 ENCOUNTER — Ambulatory Visit (INDEPENDENT_AMBULATORY_CARE_PROVIDER_SITE_OTHER): Payer: BC Managed Care – PPO | Admitting: Surgical

## 2020-09-14 ENCOUNTER — Other Ambulatory Visit: Payer: Self-pay

## 2020-09-14 ENCOUNTER — Encounter: Payer: Self-pay | Admitting: Surgical

## 2020-09-14 VITALS — BP 110/77 | HR 90 | Temp 97.5°F

## 2020-09-14 DIAGNOSIS — C50411 Malignant neoplasm of upper-outer quadrant of right female breast: Secondary | ICD-10-CM

## 2020-09-14 DIAGNOSIS — Z171 Estrogen receptor negative status [ER-]: Secondary | ICD-10-CM

## 2020-09-14 DIAGNOSIS — Z9011 Acquired absence of right breast and nipple: Secondary | ICD-10-CM

## 2020-09-14 NOTE — Progress Notes (Signed)
Patient is a 44 year old female here for follow-up on her right breast reconstruction by Dr. Marla Roe on 08/05/2020.  Patient is here with her husband.  She reports that she has started chemotherapy.  She reports that she has had some nausea after chemotherapy, but overall feels as if things are going well so far.  She reports that she has chemotherapy for approximately 5 more months.  Chaperone present on exam On exam right breast incision intact, some residual Dermabond noted.  No wound dehiscence noted.  No erythema of the right breast noted.  NAC is viable with good color.  No necrosis noted.  We placed injectable saline in the Expander using a sterile technique: Right: 30 cc for a total of 240 / 350 cc  Recommend following up in 1 month for reevaluation and for additional fill.  I recommend she call with any questions or concerns prior.  We did discuss options for managing the left breast to the right breast.   There is no sign of infection, seroma, hematoma of the right breast.

## 2020-09-15 IMAGING — US US PELVIS COMPLETE TRANSABD/TRANSVAG W DUPLEX
1 series · 13 of 25 positions shown · non-contrast
Comparison: CT 05/30/2019

CLINICAL DATA: Lower abdominal pain

EXAM:
TRANSABDOMINAL AND TRANSVAGINAL ULTRASOUND OF PELVIS
DOPPLER ULTRASOUND OF OVARIES
TECHNIQUE: Both transabdominal and transvaginal ultrasound examinations of the
pelvis were performed. Transabdominal technique was performed for
global imaging of the pelvis including uterus, ovaries, adnexal
regions, and pelvic cul-de-sac.
It was necessary to proceed with endovaginal exam following the
transabdominal exam to visualize the uterus, endometrium, ovaries
and adnexa. Color and duplex Doppler ultrasound was utilized to
evaluate blood flow to the ovaries.

[Series 1: us pelvis complete transabd/transvag w duplex · 117 acquisitions, 13 frames shown]
[im 1/117]
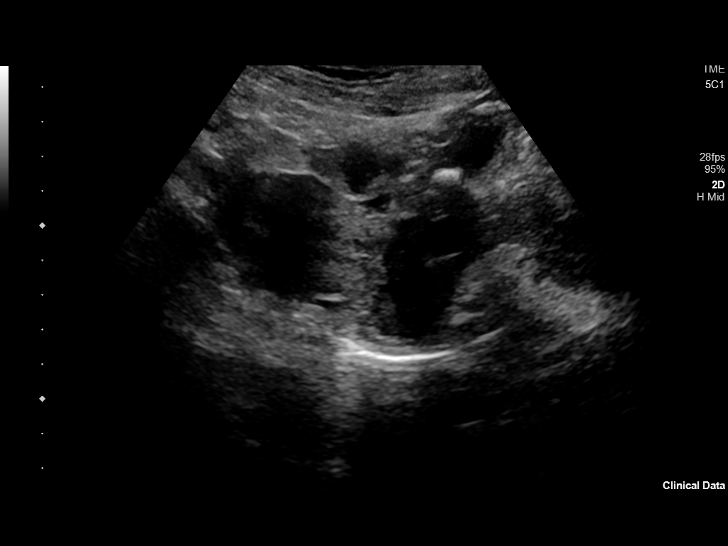
[im 10/117]
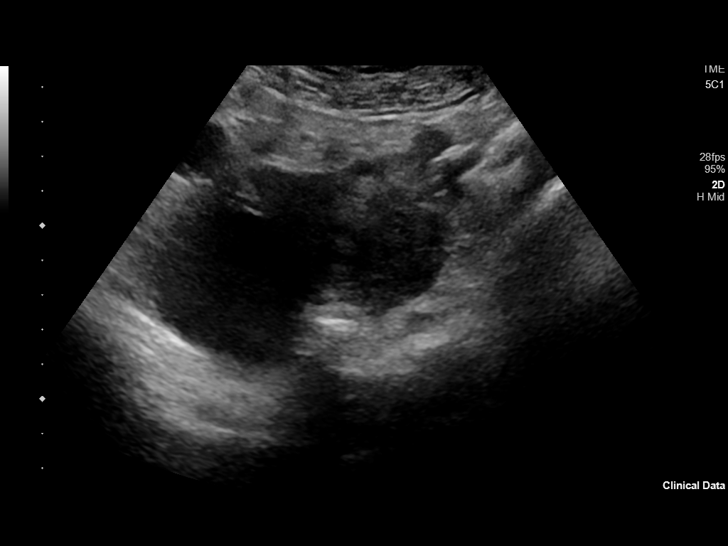
[im 20/117]
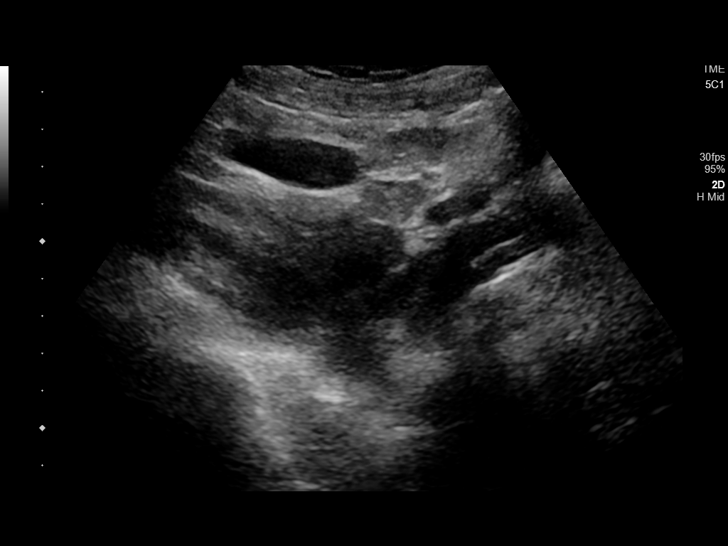
[im 30/117]
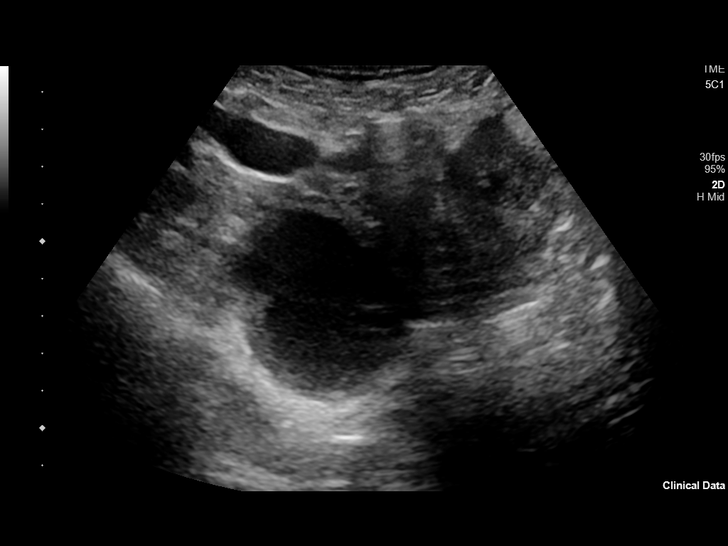
[im 39/117]
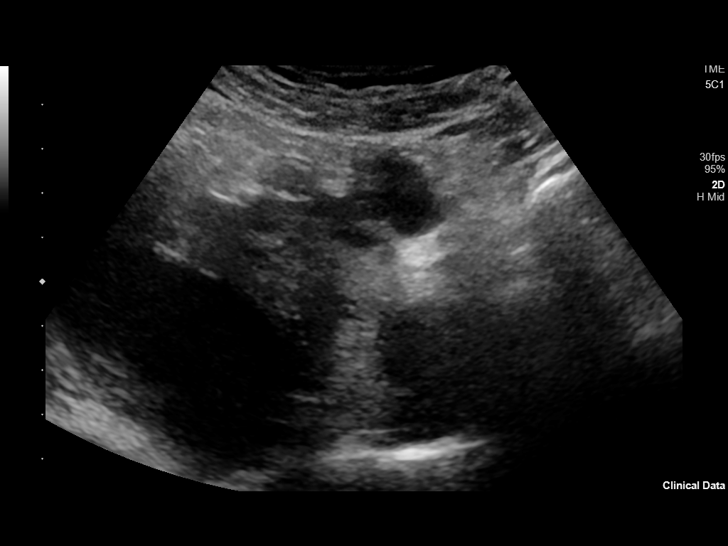
[im 49/117]
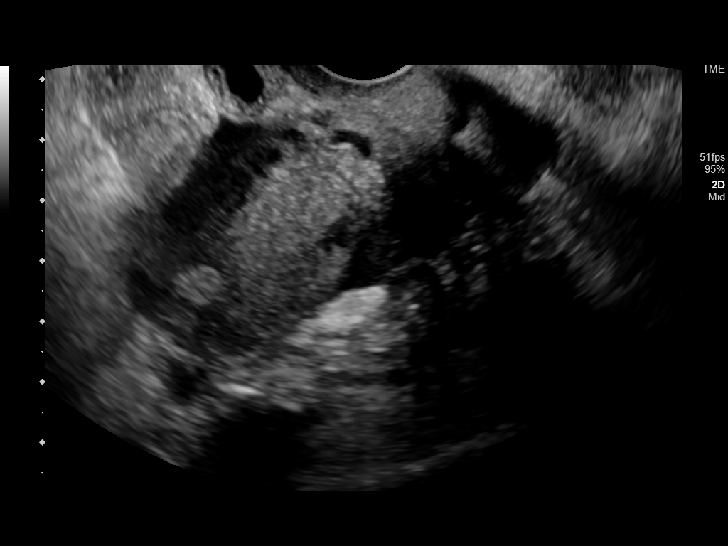
[im 59/117]
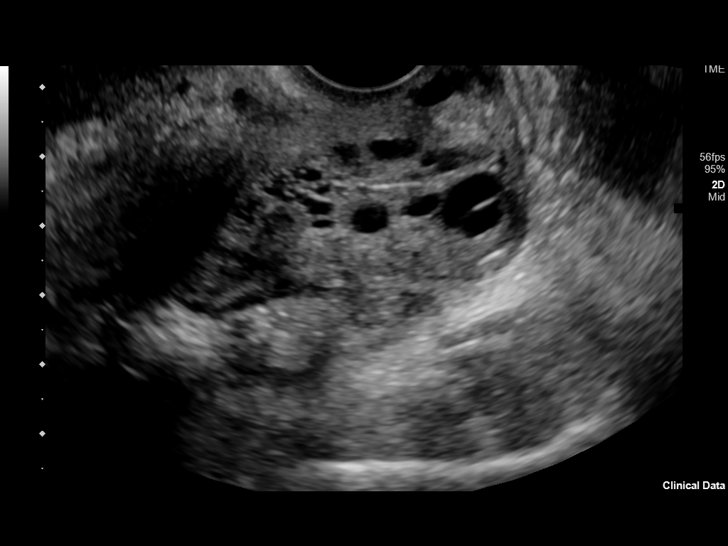
[im 68/117]
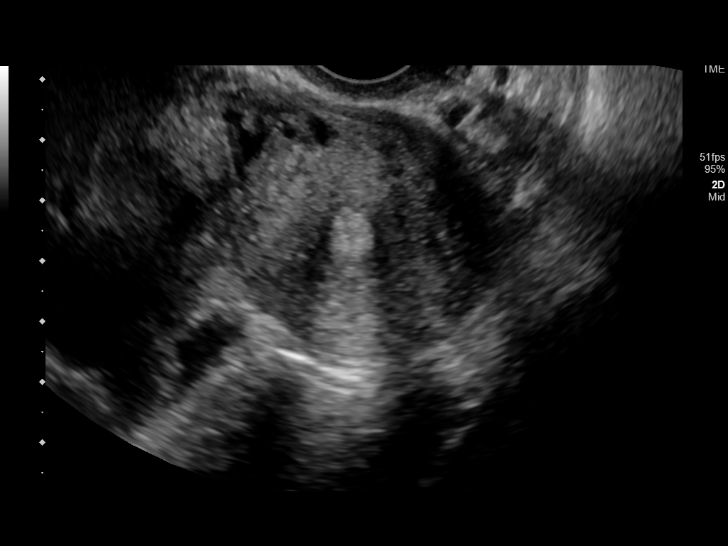
[im 78/117]
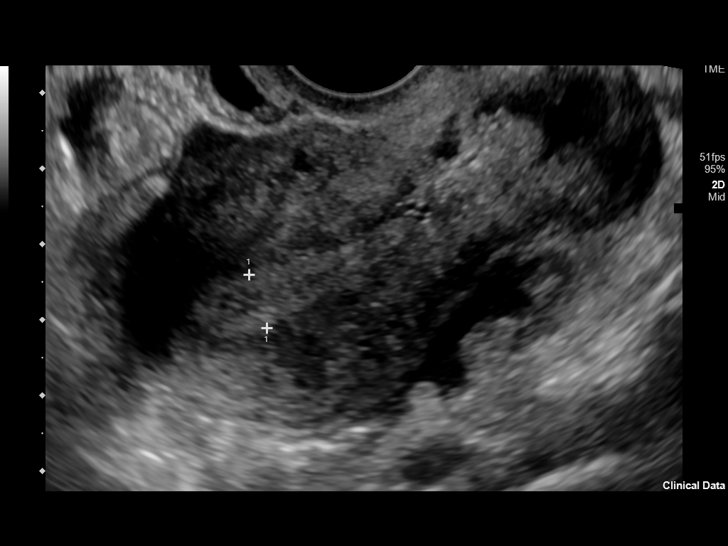
[im 88/117]
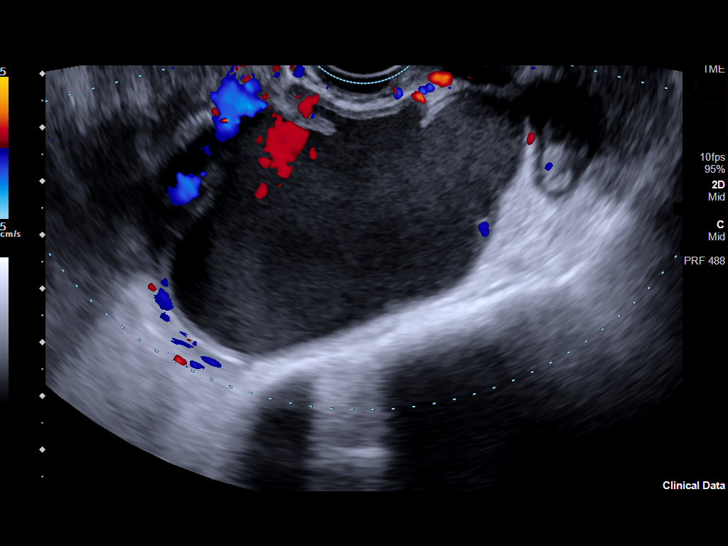
[im 97/117]
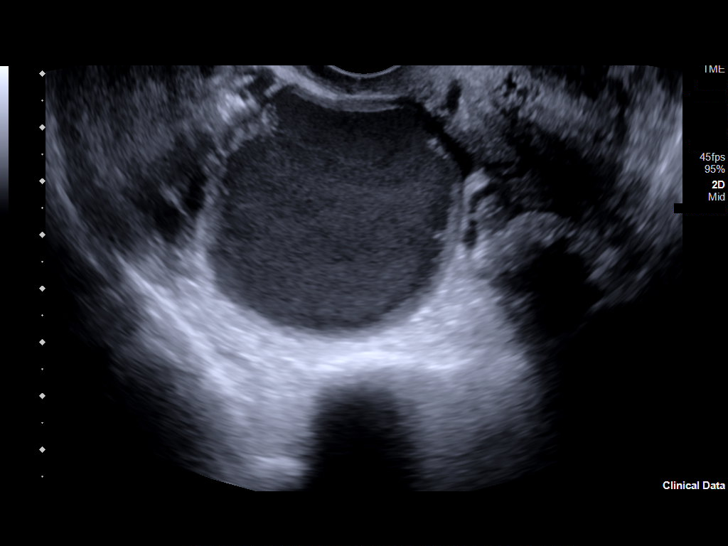
[im 107/117]
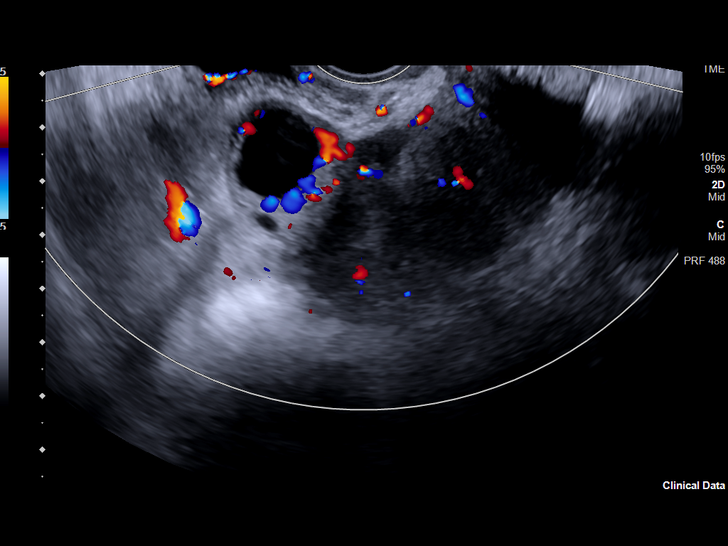
[im 117/117]
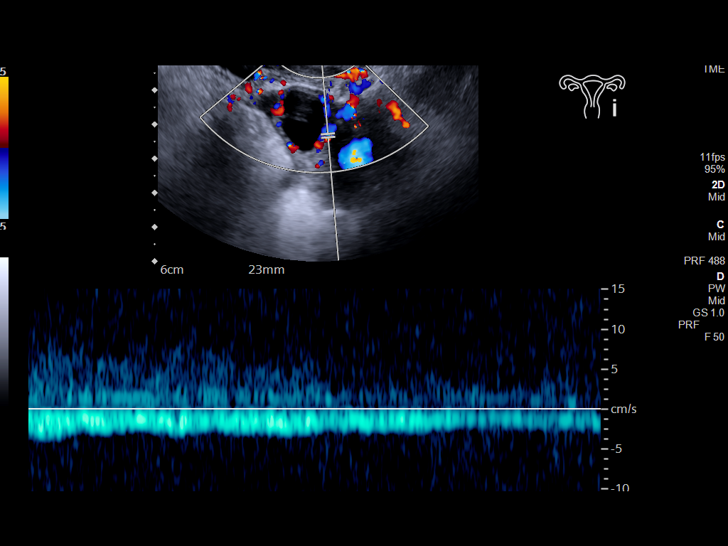

[13 of 25 positions shown; findings below may reference images not displayed]

FINDINGS: Uterus

Measurements: 7.6 x 3.7 x 4.6 cm = volume: 65.9 mL. No fibroids or
other mass visualized.

Endometrium

Thickness: 7 mm in thickness.  No focal abnormality visualized.

Right ovary

Measurements: 7.6 x 5.7 x 4.8 cm = volume: 110 mL. 7.3 x 4.2 x
cm complex cyst within the right ovary with low level internal
echoes. Appearance most suggestive of endometrioma.

Left ovary

Measurements: 3.2 x 2.8 x 1.9 cm = volume: 8.6 mL. Normal
appearance/no adnexal mass.

Pulsed Doppler evaluation of both ovaries demonstrates normal
low-resistance arterial and venous waveforms.

Other findings

Trace free fluid in the left adnexa.
IMPRESSION: 7.3 x 4.3 x 4.2 cm cystic mass within the right ovary with low level
internal echoes. Appearance is most suggestive of endometrioma. No
evidence of torsion.

## 2020-09-22 ENCOUNTER — Encounter: Payer: Self-pay | Admitting: *Deleted

## 2020-09-23 ENCOUNTER — Inpatient Hospital Stay: Payer: BC Managed Care – PPO

## 2020-09-23 NOTE — Progress Notes (Signed)
Nutrition Follow-up:  Patient with triple negative breast cancer.  Patient s/p mastectomy.  Patient receiving chemotherapy.   Spoke with patient via phone for nutrition follow-up.  Patient reports decreased appetite, nausea following first round of chemotherapy.  Appetite returned around Christmas and has been eating well since then.  Reports that water taste awful.      Medications: compazine, zofran  Labs: reviewed  Anthropometrics:   Weight 102 lb decreased from 104 lb on 12/6 UBW: 98 lb   NUTRITION DIAGNOSIS: Food and nutrition related knowledge deficit improved    INTERVENTION:  Discussed ways to flavor water.   Encouraged using nausea medications Reviewed strategies for nausea.   Encouraged patient to maximize intake on days when feeling well.    MONITORING, EVALUATION, GOAL: weight trends, intake   NEXT VISIT: Jan 20 phone f/u  Glynn Yepes B. Freida Busman, RD, LDN Registered Dietitian [phone] (mobile)

## 2020-09-27 ENCOUNTER — Encounter: Payer: Self-pay | Admitting: Oncology

## 2020-09-27 ENCOUNTER — Inpatient Hospital Stay: Payer: BC Managed Care – PPO | Attending: Oncology

## 2020-09-27 ENCOUNTER — Inpatient Hospital Stay: Payer: BC Managed Care – PPO

## 2020-09-27 ENCOUNTER — Inpatient Hospital Stay (HOSPITAL_BASED_OUTPATIENT_CLINIC_OR_DEPARTMENT_OTHER): Payer: BC Managed Care – PPO | Admitting: Oncology

## 2020-09-27 ENCOUNTER — Other Ambulatory Visit: Payer: Self-pay

## 2020-09-27 VITALS — BP 120/86 | HR 89 | Temp 97.3°F | Resp 20 | Wt 100.0 lb

## 2020-09-27 DIAGNOSIS — Z5111 Encounter for antineoplastic chemotherapy: Secondary | ICD-10-CM | POA: Diagnosis not present

## 2020-09-27 DIAGNOSIS — K1232 Oral mucositis (ulcerative) due to other drugs: Secondary | ICD-10-CM | POA: Insufficient documentation

## 2020-09-27 DIAGNOSIS — C50411 Malignant neoplasm of upper-outer quadrant of right female breast: Secondary | ICD-10-CM

## 2020-09-27 DIAGNOSIS — Z79899 Other long term (current) drug therapy: Secondary | ICD-10-CM | POA: Diagnosis not present

## 2020-09-27 DIAGNOSIS — R5383 Other fatigue: Secondary | ICD-10-CM | POA: Diagnosis not present

## 2020-09-27 DIAGNOSIS — G629 Polyneuropathy, unspecified: Secondary | ICD-10-CM | POA: Diagnosis not present

## 2020-09-27 DIAGNOSIS — Z5189 Encounter for other specified aftercare: Secondary | ICD-10-CM | POA: Diagnosis not present

## 2020-09-27 DIAGNOSIS — R519 Headache, unspecified: Secondary | ICD-10-CM | POA: Diagnosis not present

## 2020-09-27 DIAGNOSIS — Z171 Estrogen receptor negative status [ER-]: Secondary | ICD-10-CM | POA: Diagnosis not present

## 2020-09-27 DIAGNOSIS — I10 Essential (primary) hypertension: Secondary | ICD-10-CM | POA: Insufficient documentation

## 2020-09-27 DIAGNOSIS — T451X5A Adverse effect of antineoplastic and immunosuppressive drugs, initial encounter: Secondary | ICD-10-CM | POA: Diagnosis not present

## 2020-09-27 DIAGNOSIS — R5381 Other malaise: Secondary | ICD-10-CM | POA: Diagnosis not present

## 2020-09-27 DIAGNOSIS — K5900 Constipation, unspecified: Secondary | ICD-10-CM | POA: Insufficient documentation

## 2020-09-27 DIAGNOSIS — Z79818 Long term (current) use of other agents affecting estrogen receptors and estrogen levels: Secondary | ICD-10-CM | POA: Insufficient documentation

## 2020-09-27 DIAGNOSIS — R0609 Other forms of dyspnea: Secondary | ICD-10-CM | POA: Insufficient documentation

## 2020-09-27 DIAGNOSIS — K219 Gastro-esophageal reflux disease without esophagitis: Secondary | ICD-10-CM | POA: Insufficient documentation

## 2020-09-27 DIAGNOSIS — Z9011 Acquired absence of right breast and nipple: Secondary | ICD-10-CM | POA: Diagnosis not present

## 2020-09-27 DIAGNOSIS — G4700 Insomnia, unspecified: Secondary | ICD-10-CM | POA: Diagnosis not present

## 2020-09-27 LAB — CBC WITH DIFFERENTIAL/PLATELET
Abs Immature Granulocytes: 0.55 10*3/uL — ABNORMAL HIGH (ref 0.00–0.07)
Basophils Absolute: 0.1 10*3/uL (ref 0.0–0.1)
Basophils Relative: 1 %
Eosinophils Absolute: 0 10*3/uL (ref 0.0–0.5)
Eosinophils Relative: 0 %
HCT: 32 % — ABNORMAL LOW (ref 36.0–46.0)
Hemoglobin: 10.9 g/dL — ABNORMAL LOW (ref 12.0–15.0)
Immature Granulocytes: 6 %
Lymphocytes Relative: 19 %
Lymphs Abs: 1.7 10*3/uL (ref 0.7–4.0)
MCH: 30.6 pg (ref 26.0–34.0)
MCHC: 34.1 g/dL (ref 30.0–36.0)
MCV: 89.9 fL (ref 80.0–100.0)
Monocytes Absolute: 0.9 10*3/uL (ref 0.1–1.0)
Monocytes Relative: 11 %
Neutro Abs: 5.6 10*3/uL (ref 1.7–7.7)
Neutrophils Relative %: 63 %
Platelets: 393 10*3/uL (ref 150–400)
RBC: 3.56 MIL/uL — ABNORMAL LOW (ref 3.87–5.11)
RDW: 12.1 % (ref 11.5–15.5)
WBC: 8.8 10*3/uL (ref 4.0–10.5)
nRBC: 0 % (ref 0.0–0.2)

## 2020-09-27 LAB — COMPREHENSIVE METABOLIC PANEL
ALT: 12 U/L (ref 0–44)
AST: 19 U/L (ref 15–41)
Albumin: 4 g/dL (ref 3.5–5.0)
Alkaline Phosphatase: 74 U/L (ref 38–126)
Anion gap: 5 (ref 5–15)
BUN: 25 mg/dL — ABNORMAL HIGH (ref 6–20)
CO2: 26 mmol/L (ref 22–32)
Calcium: 8.9 mg/dL (ref 8.9–10.3)
Chloride: 104 mmol/L (ref 98–111)
Creatinine, Ser: 0.86 mg/dL (ref 0.44–1.00)
GFR, Estimated: >60 mL/min (ref >60)
Glucose, Bld: 96 mg/dL (ref 70–99)
Potassium: 3.7 mmol/L (ref 3.5–5.1)
Sodium: 135 mmol/L (ref 135–145)
Total Bilirubin: 0.3 mg/dL (ref 0.3–1.2)
Total Protein: 7 g/dL (ref 6.5–8.1)

## 2020-09-27 MED ORDER — HEPARIN SOD (PORK) LOCK FLUSH 100 UNIT/ML IV SOLN
INTRAVENOUS | Status: AC
  Filled 2020-09-27: qty 5

## 2020-09-27 MED ORDER — SODIUM CHLORIDE 0.9 % IV SOLN
150.0000 mg | Freq: Once | INTRAVENOUS | Status: AC
  Administered 2020-09-27: 150 mg via INTRAVENOUS
  Filled 2020-09-27: qty 150

## 2020-09-27 MED ORDER — HEPARIN SOD (PORK) LOCK FLUSH 100 UNIT/ML IV SOLN
500.0000 [IU] | Freq: Once | INTRAVENOUS | Status: AC | PRN
  Administered 2020-09-27: 500 [IU]
  Filled 2020-09-27: qty 5

## 2020-09-27 MED ORDER — SODIUM CHLORIDE 0.9 % IV SOLN
10.0000 mg | Freq: Once | INTRAVENOUS | Status: AC
  Administered 2020-09-27: 10 mg via INTRAVENOUS
  Filled 2020-09-27: qty 10

## 2020-09-27 MED ORDER — SODIUM CHLORIDE 0.9% FLUSH
10.0000 mL | INTRAVENOUS | Status: DC | PRN
  Filled 2020-09-27: qty 10

## 2020-09-27 MED ORDER — PALONOSETRON HCL INJECTION 0.25 MG/5ML
0.2500 mg | Freq: Once | INTRAVENOUS | Status: AC
  Administered 2020-09-27: 0.25 mg via INTRAVENOUS
  Filled 2020-09-27: qty 5

## 2020-09-27 MED ORDER — SODIUM CHLORIDE 0.9 % IV SOLN
600.0000 mg/m2 | Freq: Once | INTRAVENOUS | Status: AC
  Administered 2020-09-27: 860 mg via INTRAVENOUS
  Filled 2020-09-27: qty 43

## 2020-09-27 MED ORDER — SODIUM CHLORIDE 0.9 % IV SOLN
Freq: Once | INTRAVENOUS | Status: AC
  Filled 2020-09-27: qty 250

## 2020-09-27 MED ORDER — DOXORUBICIN HCL CHEMO IV INJECTION 2 MG/ML
60.0000 mg/m2 | Freq: Once | INTRAVENOUS | Status: AC
  Administered 2020-09-27: 86 mg via INTRAVENOUS
  Filled 2020-09-27: qty 20

## 2020-09-27 NOTE — Progress Notes (Signed)
Pt tolerated chemo treatment well today with no complaints or concerns noted.  Pt left chemo suite stable and ambulatory.

## 2020-09-27 NOTE — Progress Notes (Signed)
Hematology/Oncology Consult note [facility]  [phone] [phone]  Patient Care Team: [name] as PCP - General Cammie Sickle, [name]   Name of the patient: Beth Wells  [id number]  Nov 15, 1975   Date of visit: 09/27/20  Diagnosis-stage I triple negative breast cancer  Chief complaint/ Reason for visit-on treatment assessment prior to cycle 2 of dose dense AC chemotherapy  Heme/Onc history:  Oncology History Overview Note  # OCT 2021-RIGHT BREAST- TRIPLE NEGATIVE [place]; [US/mammo-73m]; Dr.Cintron; OCT 2021-USThere is a 7 mm mass in the right breast at 10 o'clock, favored to represent a complicated cyst;  No evidence of right axillary lymphadenopathy].   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 [Dr.Shah]- skin biopsy- on cymblata+ Lyrica  # DEC 2021-status post evaluation with reproductive endocrinology [9-10% chance of viable pregnancy] # # SURVIVORSHIP:   # GENETICS: BARD  DIAGNOSIS: Breast cancer-triple negative  STAGE:  I       ;  GOALS: cure  CURRENT/MOST RECENT THERAPY : AC-T    Carcinoma of upper-outer quadrant of right breast in female, estrogen receptor negative ([place]  07/13/2020 Initial Diagnosis   Carcinoma of upper-outer quadrant of right breast in female, estrogen receptor negative ([place]    Genetic Testing   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 The report date is 08/02/2020.  The Multi-Cancer Panel offered by [facility] includes sequencing and/or deletion duplication testing of the following 85 genes: AIP, ALK, APC, ATM, AXIN2,BAP1,  BARD1, BLM, BMPR1A, BRCA1, BRCA2, BRIP1, CASR, CDC73, CDH1, CDK4, CDKN1B, CDKN1C, CDKN2A (p14ARF), CDKN2A (p16INK4a), CEBPA, CHEK2, CTNNA1, DICER1, DIS3L2, EGFR (c.2369C>T, p.Thr790Met variant  only), EPCAM (Deletion/duplication testing only), FH, FLCN, GATA2, GPC3, GREM1 (Promoter region deletion/duplication testing only), HOXB13 (c.251G>A, p.Gly84Glu), HRAS, KIT, MAX, MEN1, MET, MITF (c.952G>A, p.Glu318Lys variant only), MLH1, MSH2, MSH3, MSH6, MUTYH, NBN, NF1, NF2, NTHL1, PALB2, PDGFRA, PHOX2B, PMS2, POLD1, POLE, POT1, PRKAR1A, PTCH1, PTEN, RAD50, RAD51C, RAD51D, RB1, RECQL4, RET, RNF43, RUNX1, SDHAF2, SDHA (sequence changes only), SDHB, SDHC, SDHD, SMAD4, SMARCA4, SMARCB1, SMARCE1, STK11, SUFU, TERC, TERT, TMEM127, TP53, TSC1, TSC2, VHL, WRN and WT1.    09/10/2020 -  Chemotherapy   The patient had dexamethasone (DECADRON) 4 MG tablet, 8 mg, Oral, Daily, 1 of 1 cycle, Start date: --, End date: -- DOXOrubicin (ADRIAMYCIN) chemo injection 86 mg, 60 mg/m2 = 86 mg, Intravenous,  Once, 1 of 4 cycles Administration: 86 mg (09/10/2020) palonosetron (ALOXI) injection 0.25 mg, 0.25 mg, Intravenous,  Once, 1 of 4 cycles Administration: 0.25 mg (09/10/2020) pegfilgrastim-cbqv (UDENYCA) injection 6 mg, 6 mg, Subcutaneous, Once, 1 of 4 cycles Administration: 6 mg (09/13/2020) cyclophosphamide (CYTOXAN) 860 mg in sodium chloride 0.9 % 250 mL chemo infusion, 600 mg/m2 = 860 mg, Intravenous,  Once, 1 of 4 cycles Administration: 860 mg (09/10/2020) PACLitaxel (TAXOL) 114 mg in sodium chloride 0.9 % 250 mL chemo infusion (</= 83mm2), 80 mg/m2, Intravenous,  Once, 0 of 12 cycles fosaprepitant (EMEND) 150 mg in sodium chloride 0.9 % 145 mL IVPB, 150 mg, Intravenous,  Once, 1 of 4 cycles Administration: 150 mg (09/10/2020)  for chemotherapy treatment.       Interval history-reports on and off headaches since starting chemotherapy.  Occasional nausea which is well controlled with nausea medications.  Reports sensitivity in her scalp  ECOG PS- 0 Pain scale- 0   Review of  systems- Review of Systems  Constitutional: Positive for malaise/fatigue. Negative for chills, fever and weight loss.  HENT:  Negative for congestion, ear discharge and nosebleeds.   Eyes: Negative for blurred vision.  Respiratory: Negative for cough, hemoptysis, sputum production, shortness of breath and wheezing.   Cardiovascular: Negative for chest pain, palpitations, orthopnea and claudication.  Gastrointestinal: Negative for abdominal pain, blood in stool, constipation, diarrhea, heartburn, melena, nausea and vomiting.  Genitourinary: Negative for dysuria, flank pain, frequency, hematuria and urgency.  Musculoskeletal: Negative for back pain, joint pain and myalgias.  Skin: Negative for rash.  Neurological: Positive for headaches. Negative for dizziness, tingling, focal weakness, seizures and weakness.  Endo/Heme/Allergies: Does not bruise/bleed easily.  Psychiatric/Behavioral: Negative for depression and suicidal ideas. The patient has insomnia.        No Known Allergies   Past Medical History:  Diagnosis Date  . Breast cancer ([place]) 08/05/2020  . Carcinoma of upper-outer quadrant of right breast in female, estrogen receptor negative ([place]) 07/13/2020  . Complication of anesthesia    hard to wake up-very dizzy feeling like she was going to pass out  . GERD (gastroesophageal reflux disease)    no meds  . H/O foot surgery 08/2018  . History of kidney stones    h/o  . Hypertension   . PONV (postoperative nausea and vomiting)    n/v aftter kidney stone surgery     Past Surgical History:  Procedure Laterality Date  . BREAST BIOPSY Right 07/06/2020   u/s bx Q clip path pending  . BREAST RECONSTRUCTION WITH PLACEMENT OF TISSUE EXPANDER AND FLEX HD (ACELLULAR HYDRATED DERMIS) Right 08/05/2020   Procedure: RIGHT BREAST RECONSTRUCTION WITH PLACEMENT OF TISSUE EXPANDER AND FLEX HD (ACELLULAR HYDRATED DERMIS);  Surgeon: Wallace Going, DO;  Location: [facility] ORS;  Service: Plastics;  Laterality: Right;  . BUNIONECTOMY Right    titanium pin  . CERVICAL CONE BIOPSY    . DILATION AND EVACUATION N/A  03/12/2018   Procedure: DILATATION AND EVACUATION;  Surgeon: [name], MD;  Location: [facility] ORS;  Service: Gynecology;  Laterality: N/A;  . KIDNEY STONE SURGERY    . LAPAROSCOPIC OVARIAN CYSTECTOMY Right 06/07/2019   Procedure: LAPAROSCOPIC OVARIAN CYSTECTOMY;  Surgeon: [name], MD;  Location: [facility] ORS;  Service: Gynecology;  Laterality: Right;  . LAPAROSCOPY N/A 06/07/2019   Procedure: LAPAROSCOPY Luvenia Redden BIOPSIES;  Surgeon: [name], MD;  Location: [facility] ORS;  Service: Gynecology;  Laterality: N/A;  . PORTACATH PLACEMENT Left 08/05/2020   Procedure: INSERTION PORT-A-CATH;  Surgeon: Herbert Pun, MD;  Location: [facility] ORS;  Service: General;  Laterality: Left;  . TOTAL MASTECTOMY Right 08/05/2020   Procedure: TOTAL MASTECTOMY w/ Sentinel Node;  Surgeon: Herbert Pun, MD;  Location: [facility] ORS;  Service: General;  Laterality: Right;    Social History   Socioeconomic History  . Marital status: Married    Spouse name: [name]  . Number of children: 0  . Years of education: Not on file  . Highest education level: Not on file  Occupational History  . Occupation: Market researcher: [employer]  Tobacco Use  . Smoking status: Never Smoker  . Smokeless tobacco: Never Used  Vaping Use  . Vaping Use: Never used  Substance and Sexual Activity  . Alcohol use: Not Currently  . Drug use: Never  . Sexual activity: Yes    Birth control/protection: None  Other Topics Concern  . 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 No children. Lives in [place].    Social Determinants of Health   Financial Resource Strain: Low Risk   . Difficulty of Paying Living Expenses: Not hard at all  Food Insecurity: Not on file  Transportation Needs: No Transportation Needs  . Lack of Transportation (Medical): No  . Lack of Transportation (Non-Medical): No  Physical  Activity: Not on file  Stress: Stress Concern Present  . Feeling of Stress : Very much  Social Connections: Not on file  Intimate Partner Violence: Not on file    Family History  Problem Relation Age of Onset  . Cancer Other        great aunt- GI cancer  . Breast cancer Neg Hx      Current Outpatient Medications:  .  acetaminophen (TYLENOL) 500 MG tablet, Take 1 tablet (500 mg total) by mouth every 6 (six) hours as needed. For use AFTER surgery, Disp: 30 tablet, Rfl: 0 .  ALPHA-LIPOIC ACID PO, Take 500 mg by mouth daily. , Disp: , Rfl:  .  amphetamine-dextroamphetamine (ADDERALL XR) 30 MG 24 hr capsule, Take 30 mg by mouth daily. , Disp: , Rfl:  .  DULoxetine (CYMBALTA) 20 MG capsule, Take 2 capsules (40 mg total) by mouth at bedtime. (Patient taking differently: Take 20 mg by mouth at bedtime.), Disp: 60 capsule, Rfl: 2 .  ibuprofen (ADVIL) 600 MG tablet, Take 1 tablet (600 mg total) by mouth every 6 (six) hours as needed for mild pain or moderate pain. For use AFTER surgery, Disp: 30 tablet, Rfl: 0 .  lidocaine-prilocaine (EMLA) cream, Apply 1 application topically as needed. On the port generously; 30-45 mins prior to chemo appt., Disp: 30 g, Rfl: 0 .  lisinopril (ZESTRIL) 5 MG tablet, Take 5 mg by mouth every evening. , Disp: , Rfl:  .  ondansetron (ZOFRAN) 8 MG tablet, One pill every 8 hours as needed for nausea/vomitting., Disp: 40 tablet, Rfl: 1 .  pregabalin (LYRICA) 100 MG capsule, Take 1 capsule (100 mg total) by mouth 2 (two) times daily., Disp: 180 capsule, Rfl: 1 .  Probiotic Product (PROBIOTIC ADVANCED PO), Take 1 capsule by mouth daily. , Disp: , Rfl:  .  prochlorperazine (COMPAZINE) 10 MG tablet, Take 1 tablet (10 mg total) by mouth every 6 (six) hours as needed for nausea or vomiting., Disp: 40 tablet, Rfl: 1 .  sodium fluoride (FLUORISHIELD) 1.1 % GEL dental gel, , Disp: , Rfl:  .  trolamine salicylate (ASPERCREME) 10 % cream, Apply 1 application topically as needed for  muscle pain., Disp: , Rfl:   Physical exam:  Vitals:   09/27/20 0925  BP: 120/86  Pulse: 89  Resp: 20  Temp: (!) 97.3 F (36.3 C)  TempSrc: Tympanic  SpO2: 100%  Weight: 100 lb (45.4 kg)   Physical Exam HENT:     Head: Normocephalic and atraumatic.  Eyes:     Extraocular Movements: EOM normal.     Pupils: Pupils are equal, round, and reactive to light.  Cardiovascular:     Rate and Rhythm: Normal rate and regular rhythm.     Heart sounds: Normal heart sounds.  Pulmonary:     Effort: Pulmonary effort is normal.     Breath sounds: Normal breath sounds.  Abdominal:     General: Bowel sounds are normal.     Palpations: Abdomen is soft.  Musculoskeletal:     Cervical back: Normal range of motion.  Skin:  General: Skin is warm and dry.  Neurological:     Mental Status: She is alert and oriented to person, place, and time.      CMP Latest Ref Rng & Units 09/27/2020  Glucose 70 - 99 mg/dL 96  BUN 6 - 20 mg/dL 25(H)  Creatinine 0.44 - 1.00 mg/dL 0.86  Sodium 135 - 145 mmol/L 135  Potassium 3.5 - 5.1 mmol/L 3.7  Chloride 98 - 111 mmol/L 104  CO2 22 - 32 mmol/L 26  Calcium 8.9 - 10.3 mg/dL 8.9  Total Protein 6.5 - 8.1 g/dL 7.0  Total Bilirubin 0.3 - 1.2 mg/dL 0.3  Alkaline Phos 38 - 126 U/L 74  AST 15 - 41 U/L 19  ALT 0 - 44 U/L 12   CBC Latest Ref Rng & Units 09/27/2020  WBC 4.0 - 10.5 K/uL 8.8  Hemoglobin 12.0 - 15.0 g/dL 10.9(L)  Hematocrit 36.0 - 46.0 % 32.0(L)  Platelets 150 - 400 K/uL 393    No images are attached to the encounter.  NM Cardiac [facility]  Result Date: 09/06/2020 CLINICAL DATA:  RIGHT breast cancer, encounter for anti neoplastic chemotherapy EXAM: NUCLEAR MEDICINE CARDIAC BLOOD POOL IMAGING (MUGA) TECHNIQUE: Cardiac multi-gated acquisition was performed at rest following intravenous injection of Tc-40mlabeled red blood cells. RADIOPHARMACEUTICALS:  20.94 mCi Tc-927mertechnetate in-vitro labeled red blood cells IV COMPARISON:  None FINDINGS:  Calculated LEFT ventricular ejection fraction is 58.5%, normal. Study was obtained at a cardiac rate of 77 bpm. Patient was rhythmic during imaging. Cine analysis of the LEFT ventricle in 3 projections demonstrates normal LEFT ventricular wall motion. IMPRESSION: Normal LEFT ventricular ejection fraction of 58.5%. Normal LV wall motion. Electronically Signed   By: MaLavonia Dana.D.   On: 09/06/2020 15:48     Assessment and plan- Patient is a [age].o. female with stage Ib triple negative breast cancer here for on treatment assessment prior to cycle 2 of dose dense AC chemotherapy  Counts okay to proceed with cycle 2 of dose dense AC chemotherapy with growth factor support.  Overall she is tolerating treatment well without any significant nausea or vomiting.  She will see Dr. BrRogue Bussingn 2 weeks time with CBC with differential and CMP for cycle 3  Patient has baseline small fiber neuropathy for which she is on Cymbalta and Lyrica and follows up with neurology  Patient is on Lupron on a monthly basis for ovarian suppression for fertility preservation.  She received her first cycle of Lupron on 12/14 and will come next week on 10/05/2020 for dose 2  Headaches: Okay to take as needed Tylenol  Insomnia: Patient would like to try melatonin at night which would be okay.  Scalp sensitivity: Likely secondary [facility]  Continue to monitor  Visit Diagnosis 1. Encounter for antineoplastic chemotherapy   2. Carcinoma of upper-outer quadrant of right breast in female, estrogen receptor negative ([facility]     Dr. ArRanda EvensMD, MPH [id number]/11/2020 12:32 PM

## 2020-09-28 ENCOUNTER — Inpatient Hospital Stay: Payer: BC Managed Care – PPO

## 2020-09-28 ENCOUNTER — Telehealth: Payer: Self-pay | Admitting: *Deleted

## 2020-09-28 ENCOUNTER — Other Ambulatory Visit: Payer: Self-pay

## 2020-09-28 DIAGNOSIS — C50411 Malignant neoplasm of upper-outer quadrant of right female breast: Secondary | ICD-10-CM | POA: Diagnosis not present

## 2020-09-28 MED ORDER — PEGFILGRASTIM-CBQV 6 MG/0.6ML ~~LOC~~ SOSY
6.0000 mg | PREFILLED_SYRINGE | Freq: Once | SUBCUTANEOUS | Status: AC
  Administered 2020-09-28: 6 mg via SUBCUTANEOUS
  Filled 2020-09-28: qty 0.6

## 2020-09-28 NOTE — Telephone Encounter (Signed)
Beth Wells with [employer] called stating that she received records, but it is not stated what work restrictions the patient has and she needs that information to complete her claim. Please return her call with this information

## 2020-09-28 NOTE — Telephone Encounter (Signed)
This is a Dr B pt. And Herbert Seta filled out form and has to wait til Dr. B gets back from vacation. Pt is aware of this and when she sendsit in. It has restrictions on it. Herbert Seta tried to call last week to [facility] and it was 2 hour wait according to  message.

## 2020-09-30 ENCOUNTER — Other Ambulatory Visit: Payer: Self-pay | Admitting: Oncology

## 2020-10-05 ENCOUNTER — Inpatient Hospital Stay: Payer: BC Managed Care – PPO

## 2020-10-05 ENCOUNTER — Other Ambulatory Visit: Payer: Self-pay

## 2020-10-05 DIAGNOSIS — C50411 Malignant neoplasm of upper-outer quadrant of right female breast: Secondary | ICD-10-CM

## 2020-10-05 MED ORDER — GOSERELIN ACETATE 3.6 MG ~~LOC~~ IMPL
3.6000 mg | DRUG_IMPLANT | Freq: Once | SUBCUTANEOUS | Status: AC
  Administered 2020-10-05: 3.6 mg via SUBCUTANEOUS
  Filled 2020-10-05: qty 3.6

## 2020-10-07 ENCOUNTER — Telehealth: Payer: Self-pay | Admitting: *Deleted

## 2020-10-07 NOTE — Telephone Encounter (Signed)
Per pt's request, I Contacted patient to discuss inclement weather plan with patient. I reassured patient that I would be in touch with her should the cancer center close on Monday or delay. Discussed the importance that her safety is our priority. If the pt does not feel safe to drive on roads, then travel at her discretion.  She is also concerned about a sore throat. Pt stated in mychart message "I've developed pain when swallowing food / drink. The pain is also there when I swallow saliva, but not as bad.   It's not a scratchy throat.  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   What can be done in cases like this?  It hurts to eat.  It hurts to drink.  What if I cannot eat or drink?  What then?  Is this common from chemo?"  Per v/o Dr. Rogue Bussing - RN sent new script for magic mouthwash to patient's pharmacy. Patient does not have any white patches on her tongue. No redness on throat visible. Encouraged patient to use magic mouth wash and good oral hygeine. She will call our office back if the magic mouth wash doesn't help. Discussed with patient that chemotherapy side effects often can cause mucositis. She will let me know if she develops any fever or other symptoms.

## 2020-10-08 ENCOUNTER — Other Ambulatory Visit: Payer: Self-pay | Admitting: Internal Medicine

## 2020-10-08 ENCOUNTER — Telehealth: Payer: Self-pay | Admitting: *Deleted

## 2020-10-08 NOTE — Telephone Encounter (Signed)
Patient contacted the cancer center. She is unable to get the magic mouth wash w/o a high copay of $100. I contacted her insurance company. respresentative at insurance stated that the pharmacy needs to resubmit a [place] codes. I contacted the pharmacy and she will need to run the code back through. She will take care of this and notify the patient.

## 2020-10-11 ENCOUNTER — Encounter: Payer: Self-pay | Admitting: Internal Medicine

## 2020-10-11 ENCOUNTER — Telehealth: Payer: Self-pay | Admitting: *Deleted

## 2020-10-11 ENCOUNTER — Inpatient Hospital Stay (HOSPITAL_BASED_OUTPATIENT_CLINIC_OR_DEPARTMENT_OTHER): Payer: BC Managed Care – PPO | Admitting: Internal Medicine

## 2020-10-11 ENCOUNTER — Other Ambulatory Visit: Payer: Self-pay

## 2020-10-11 ENCOUNTER — Inpatient Hospital Stay: Payer: BC Managed Care – PPO

## 2020-10-11 DIAGNOSIS — Z171 Estrogen receptor negative status [ER-]: Secondary | ICD-10-CM

## 2020-10-11 DIAGNOSIS — C50411 Malignant neoplasm of upper-outer quadrant of right female breast: Secondary | ICD-10-CM

## 2020-10-11 LAB — COMPREHENSIVE METABOLIC PANEL
ALT: 13 U/L (ref 0–44)
AST: 17 U/L (ref 15–41)
Albumin: 3.9 g/dL (ref 3.5–5.0)
Alkaline Phosphatase: 92 U/L (ref 38–126)
Anion gap: 7 (ref 5–15)
BUN: 24 mg/dL — ABNORMAL HIGH (ref 6–20)
CO2: 26 mmol/L (ref 22–32)
Calcium: 9 mg/dL (ref 8.9–10.3)
Chloride: 102 mmol/L (ref 98–111)
Creatinine, Ser: 0.79 mg/dL (ref 0.44–1.00)
GFR, Estimated: >60 mL/min (ref >60)
Glucose, Bld: 100 mg/dL — ABNORMAL HIGH (ref 70–99)
Potassium: 3.8 mmol/L (ref 3.5–5.1)
Sodium: 135 mmol/L (ref 135–145)
Total Bilirubin: 0.2 mg/dL — ABNORMAL LOW (ref 0.3–1.2)
Total Protein: 7.2 g/dL (ref 6.5–8.1)

## 2020-10-11 LAB — CBC WITH DIFFERENTIAL/PLATELET
Abs Immature Granulocytes: 3.68 10*3/uL — ABNORMAL HIGH (ref 0.00–0.07)
Basophils Absolute: 0.1 10*3/uL (ref 0.0–0.1)
Basophils Relative: 0 %
Eosinophils Absolute: 0.1 10*3/uL (ref 0.0–0.5)
Eosinophils Relative: 1 %
HCT: 27.9 % — ABNORMAL LOW (ref 36.0–46.0)
Hemoglobin: 9.6 g/dL — ABNORMAL LOW (ref 12.0–15.0)
Immature Granulocytes: 19 %
Lymphocytes Relative: 7 %
Lymphs Abs: 1.4 10*3/uL (ref 0.7–4.0)
MCH: 30.8 pg (ref 26.0–34.0)
MCHC: 34.4 g/dL (ref 30.0–36.0)
MCV: 89.4 fL (ref 80.0–100.0)
Monocytes Absolute: 1.5 10*3/uL — ABNORMAL HIGH (ref 0.1–1.0)
Monocytes Relative: 8 %
Neutro Abs: 12.6 10*3/uL — ABNORMAL HIGH (ref 1.7–7.7)
Neutrophils Relative %: 65 %
Platelets: 242 10*3/uL (ref 150–400)
RBC: 3.12 MIL/uL — ABNORMAL LOW (ref 3.87–5.11)
RDW: 12.3 % (ref 11.5–15.5)
Smear Review: NORMAL
WBC: 19.4 10*3/uL — ABNORMAL HIGH (ref 4.0–10.5)
nRBC: 0.1 % (ref 0.0–0.2)

## 2020-10-11 MED ORDER — ONDANSETRON HCL 8 MG PO TABS: ORAL_TABLET | ORAL | 3 refills | Status: DC

## 2020-10-11 MED ORDER — SODIUM CHLORIDE 0.9 % IV SOLN
Freq: Once | INTRAVENOUS | Status: AC
  Filled 2020-10-11: qty 250

## 2020-10-11 MED ORDER — PALONOSETRON HCL INJECTION 0.25 MG/5ML
0.2500 mg | Freq: Once | INTRAVENOUS | Status: AC
  Administered 2020-10-11: 0.25 mg via INTRAVENOUS
  Filled 2020-10-11: qty 5

## 2020-10-11 MED ORDER — SODIUM CHLORIDE 0.9 % IV SOLN
150.0000 mg | Freq: Once | INTRAVENOUS | Status: AC
  Administered 2020-10-11: 150 mg via INTRAVENOUS
  Filled 2020-10-11: qty 5

## 2020-10-11 MED ORDER — LIDOCAINE-PRILOCAINE 2.5-2.5 % EX CREA: 1.0000 "application " | TOPICAL_CREAM | CUTANEOUS | 3 refills | Status: DC | PRN

## 2020-10-11 MED ORDER — DOXORUBICIN HCL CHEMO IV INJECTION 2 MG/ML
60.0000 mg/m2 | Freq: Once | INTRAVENOUS | Status: AC
  Administered 2020-10-11: 86 mg via INTRAVENOUS
  Filled 2020-10-11: qty 43

## 2020-10-11 MED ORDER — SODIUM CHLORIDE 0.9 % IV SOLN
10.0000 mg | Freq: Once | INTRAVENOUS | Status: AC
  Administered 2020-10-11: 10 mg via INTRAVENOUS
  Filled 2020-10-11: qty 10

## 2020-10-11 MED ORDER — HEPARIN SOD (PORK) LOCK FLUSH 100 UNIT/ML IV SOLN
INTRAVENOUS | Status: AC
  Filled 2020-10-11: qty 5

## 2020-10-11 MED ORDER — SODIUM CHLORIDE 0.9 % IV SOLN
600.0000 mg/m2 | Freq: Once | INTRAVENOUS | Status: AC
  Administered 2020-10-11: 860 mg via INTRAVENOUS
  Filled 2020-10-11: qty 43

## 2020-10-11 MED ORDER — HEPARIN SOD (PORK) LOCK FLUSH 100 UNIT/ML IV SOLN
500.0000 [IU] | Freq: Once | INTRAVENOUS | Status: AC | PRN
Start: 2020-10-11 — End: 2020-10-11
  Administered 2020-10-11: 500 [IU]
  Filled 2020-10-11: qty 5

## 2020-10-11 NOTE — Telephone Encounter (Signed)
Contacted patient. Patient informed that cancer center is not opening until 11 am today. Pt lives in [place] and is concerned about secondary road travels. She will try her best to get there. Reminded patient to be safe in traveling. Reassured patient that if some reason in the event that she is unable to get to cancer ctr today, we will can r/s her apts. She gave verbal understanding.

## 2020-10-11 NOTE — Assessment & Plan Note (Addendum)
#   Stage I triple negative breast cancer on adjuvant AC- Taxol chemo.  Tolerating chemotherapy with mild to moderate side effects [fatigue nausea mucositis-see below].  # proceed with cycle #3 AC today. Labs today reviewed;  acceptable for treatment today.   #Mucositis-grade 2 secondary to chemotherapy.  Improved on Magic mouthwash.  # Constipation-G-1 continue hyrdration/fibre/ mirlaax.   #Small fiber neuropathy- [Dr.Shah]-on Cymbalta/Lyrica-STABLE.   # Fertility preserving- lupron monthly [last 1/11];   # DISPOSITION:  # chemo today # follow up in 2 weeks- MD; labs- cbc/cmp; Adriamycin- Cytoxan; fluphila-d-2; -Dr.B

## 2020-10-11 NOTE — Progress Notes (Signed)
[facility] [place] NOTE  Patient Care Team: [place], [name], [name]  CHIEF COMPLAINTS/PURPOSE OF CONSULTATION: Breast cancer  #  Oncology History Overview Note  # OCT 2021-RIGHT BREAST- TRIPLE NEGATIVE [place]; [US/mammo-58m]; Dr.Cintron; OCT 2021-USThere is a 7 mm mass in the right breast at 10 o'clock, favored to represent a complicated cyst;  No evidence of right axillary lymphadenopathy].   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 [Dr.Shah]- skin biopsy- on cymblata+ Lyrica  # DEC 2021-status post evaluation with reproductive endocrinology [9-10% chance of viable pregnancy] # # SURVIVORSHIP:   # GENETICS: BARD  DIAGNOSIS: Breast cancer-triple negative  STAGE:  I       ;  GOALS: cure  CURRENT/MOST RECENT THERAPY : AC-T    Carcinoma of upper-outer quadrant of right breast in female, estrogen receptor negative ([facility]  07/13/2020 Initial Diagnosis   Carcinoma of upper-outer quadrant of right breast in [place], estrogen receptor negative [place]    Genetic Testing   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 The report date is 08/02/2020.  The Multi-Cancer Panel offered by [facility] includes sequencing and/or deletion duplication testing of the following 85 genes: AIP, ALK, APC, ATM, AXIN2,BAP1,  BARD1, BLM, BMPR1A, BRCA1, BRCA2, BRIP1, CASR, CDC73, CDH1, CDK4, CDKN1B, CDKN1C, CDKN2A (p14ARF), CDKN2A (p16INK4a), CEBPA, CHEK2, CTNNA1, DICER1, DIS3L2, EGFR (c.2369C>T, p.Thr790Met variant only), EPCAM (Deletion/duplication testing only), FH, FLCN, GATA2, GPC3, GREM1 (Promoter region deletion/duplication testing only), HOXB13 (c.251G>A, p.Gly84Glu), HRAS, KIT, MAX, MEN1, MET, MITF (c.952G>A, p.Glu318Lys variant only), MLH1, MSH2, MSH3, MSH6, MUTYH, NBN, NF1, NF2, NTHL1,  PALB2, PDGFRA, PHOX2B, PMS2, POLD1, POLE, POT1, PRKAR1A, PTCH1, PTEN, RAD50, RAD51C, RAD51D, RB1, RECQL4, RET, RNF43, RUNX1, SDHAF2, SDHA (sequence changes only), SDHB, SDHC, SDHD, SMAD4, SMARCA4, SMARCB1, SMARCE1, STK11, SUFU, TERC, TERT, TMEM127, TP53, TSC1, TSC2, VHL, WRN and WT1.    09/10/2020 -  Chemotherapy    Patient is on Treatment Plan: BREAST ADJUVANT DOSE DENSE AC Q14D / PACLITAXEL Q7D         HISTORY OF PRESENTING ILLNESS:  Beth Carlino49y.o.  female   with stage I triple negative breast cancer currently on adjuvant chemotherapy is here for follow-up.  Patient reports her cycle #2 of AC-noted to have difficulty swallowing pain with swallowing.  However after using Magic mouthwash symptoms are improved.  She complains of fatigue.  Complains of nausea.  No vomiting.  Also complains of constipation.   Review of Systems  Constitutional: Positive for malaise/fatigue. Negative for chills, diaphoresis, fever and weight loss.  HENT: Negative for nosebleeds and sore throat.   Eyes: Negative for double vision.  Respiratory: Negative for cough, hemoptysis, sputum production, shortness of breath and wheezing.   Cardiovascular: Negative for chest pain, palpitations, orthopnea and leg swelling.  Gastrointestinal: Positive for constipation and nausea. Negative for abdominal pain, blood in stool, diarrhea, heartburn, melena and vomiting.  Genitourinary: Negative for dysuria, frequency and urgency.  Musculoskeletal: Negative for back pain and joint pain.  Skin: Negative.  Negative for itching and rash.  Neurological: Positive for tingling. Negative for dizziness, focal weakness, weakness and headaches.  Endo/Heme/Allergies: Does not bruise/bleed easily.  Psychiatric/Behavioral: Negative for depression. The patient is not nervous/anxious and does not have insomnia.      MEDICAL HISTORY:  Past Medical History:  Diagnosis Date  . Breast cancer ([facility]  08/05/2020  . Carcinoma of  upper-outer quadrant of right breast in female, estrogen receptor negative ([place]) 07/13/2020  . Complication of anesthesia    hard to wake up-very dizzy feeling like she was going to pass out  . GERD (gastroesophageal reflux disease)    no meds  . H/O foot surgery 08/2018  . History of kidney stones    h/o  . Hypertension   . PONV (postoperative nausea and vomiting)    n/v aftter kidney stone surgery    SURGICAL HISTORY: Past Surgical History:  Procedure Laterality Date  . BREAST BIOPSY Right 07/06/2020   u/s bx Q clip path pending  . BREAST RECONSTRUCTION WITH PLACEMENT OF TISSUE EXPANDER AND FLEX HD (ACELLULAR HYDRATED DERMIS) Right 08/05/2020   Procedure: RIGHT BREAST RECONSTRUCTION WITH PLACEMENT OF TISSUE EXPANDER AND FLEX HD (ACELLULAR HYDRATED DERMIS);  Surgeon: Wallace Going, DO;  Location: [facility] OR[facility];  Service: Plastics;  Laterality: Right;  . BUNIONECTOMY Right    titanium pin  . CERVICAL CONE BIOPSY    . DILATION AND EVACUATION N/A 03/12/2018   Procedure: DILATATION AND EVACUATION;  Surgeon: [name], MD;  Location: [facility];  Service: Gynecology;  Laterality: N/A;  . KIDNEY STONE SURGERY    . LAPAROSCOPIC OVARIAN CYSTECTOMY Right 06/07/2019   Procedure: LAPAROSCOPIC OVARIAN CYSTECTOMY;  Surgeon: [name], MD;  Location: [facility];  Service: Gynecology;  Laterality: Right;  . LAPAROSCOPY N/A 06/07/2019   Procedure: LAPAROSCOPY Luvenia Redden BIOPSIES;  Surgeon: [name], MD;  Location: [facility];  Service: Gynecology;  Laterality: N/A;  . PORTACATH PLACEMENT Left 08/05/2020   Procedure: INSERTION PORT-A-CATH;  Surgeon: Herbert Pun, MD;  Location: [facility];  Service: General;  Laterality: Left;  . TOTAL MASTECTOMY Right 08/05/2020   Procedure: TOTAL MASTECTOMY w/ Sentinel Node;  Surgeon: Herbert Pun, MD;  Location: [facility];  Service: General;  Laterality: Right;    SOCIAL HISTORY: Social History   Socioeconomic  History  . Marital status: Married    Spouse name: [name]  . Number of children: 0  . Years of education: Not on file  . Highest education level: Not on file  Occupational History  . Occupation: Market researcher: [employer]  Tobacco Use  . Smoking status: Never Smoker  . Smokeless tobacco: Never Used  Vaping Use  . Vaping Use: Never used  Substance and Sexual Activity  . Alcohol use: Not Currently  . Drug use: Never  . Sexual activity: Yes    Birth control/protection: None  Other Topics Concern  . 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 Lives in [place].    Social Determinants of Health   Financial Resource Strain: Low Risk   . Difficulty of Paying Living Expenses: Not hard at all  Food Insecurity: Not on file  Transportation Needs: No Transportation Needs  . Lack of Transportation (Medical): No  . Lack of Transportation (Non-Medical): No  Physical Activity: Not on file  Stress: Stress Concern Present  . Feeling of Stress : Very much  Social Connections: Not on file  Intimate Partner Violence: Not on file    FAMILY HISTORY: Family History  Problem Relation Age of Onset  . Cancer Other        great aunt- GI cancer  . Breast cancer Neg Hx     ALLERGIES:  has No Known Allergies.  MEDICATIONS:  Current Outpatient Medications  Medication Sig Dispense  Refill  . acetaminophen (TYLENOL) 500 MG tablet Take 1 tablet (500 mg total) by mouth every 6 (six) hours as needed. For use AFTER surgery 30 tablet 0  . ALPHA-LIPOIC ACID PO Take 500 mg by mouth daily.     Marland Kitchen amphetamine-dextroamphetamine (ADDERALL XR) 30 MG 24 hr capsule Take 30 mg by mouth daily.     . DULoxetine (CYMBALTA) 20 MG capsule Take 2 capsules (40 mg total) by mouth at bedtime. (Patient taking differently: Take 20 mg by mouth at bedtime.) 60 capsule 2  . ibuprofen (ADVIL) 600 MG tablet Take 1 tablet  (600 mg total) by mouth every 6 (six) hours as needed for mild pain or moderate pain. For use AFTER surgery 30 tablet 0  . lisinopril (ZESTRIL) 5 MG tablet Take 5 mg by mouth every evening.     . magic mouthwash w/lidocaine SOLN Take 5 mLs by mouth. 80 ml viscous lidocaine 2%, 80 ml Mylanta, 80 ml Diphenhydramine 12.5 mg/5 ml Elixir, 80 ml Nystatin 100,000 Unit suspension, 80 ml Prednisolone 15 mg/64m, 80 ml Distilled Water. Sig: Swish/Swallow 5-10 ml four times a day as needed. Dispense 480 ml. 3RFs    . pregabalin (LYRICA) 100 MG capsule Take 1 capsule (100 mg total) by mouth 2 (two) times daily. 180 capsule 1  . Probiotic Product (PROBIOTIC ADVANCED PO) Take 1 capsule by mouth daily.     . prochlorperazine (COMPAZINE) 10 MG tablet Take 1 tablet (10 mg total) by mouth every 6 (six) hours as needed for nausea or vomiting. 40 tablet 1  . sodium fluoride (FLUORISHIELD) 1.1 % GEL dental gel     . trolamine salicylate (ASPERCREME) 10 % cream Apply 1 application topically as needed for muscle pain.    .Marland Kitchenlidocaine-prilocaine (EMLA) cream Apply 1 application topically as needed. On the port generously; 30-45 mins prior to chemo appt. 30 g 3  . ondansetron (ZOFRAN) 8 MG tablet One pill every 8 hours as needed for nausea/vomitting. 40 tablet 3   No current facility-administered medications for this visit.   Facility-Administered Medications Ordered in Other Visits  Medication Dose Route Frequency Provider Last Rate Last Admin  . 0.9 %  sodium chloride infusion   Intravenous Once BCammie Sickle MD      . cyclophosphamide (CYTOXAN) 860 mg in sodium chloride 0.9 % 250 mL chemo infusion  600 mg/m2 (Treatment Plan Recorded) Intravenous Once BCammie Sickle MD      . dexamethasone (DECADRON) 10 mg in sodium chloride 0.9 % 50 mL IVPB  10 mg Intravenous Once BCharlaine DaltonR, MD      . DOXOrubicin (ADRIAMYCIN) chemo injection 86 mg  60 mg/m2 (Treatment Plan Recorded) Intravenous Once  BCharlaine DaltonR, MD      . fosaprepitant (EMEND) 150 mg in sodium chloride 0.9 % 145 mL IVPB  150 mg Intravenous Once BCharlaine DaltonR, MD      . palonosetron (ALOXI) injection 0.25 mg  0.25 mg Intravenous Once BCammie Sickle MD          .  PHYSICAL EXAMINATION: ECOG PERFORMANCE STATUS: 0 - Asymptomatic  Vitals:   10/11/20 0900  BP: 121/87  Pulse: (!) 103  Resp: 18  Temp: 97.6 F (36.4 C)   Filed Weights   10/11/20 0900  Weight: 101 lb (45.8 kg)    Physical Exam Constitutional:      Comments: Accompanied by husband.  HENT:     Head: Normocephalic and atraumatic.     Mouth/Throat:  Pharynx: No oropharyngeal exudate.  Eyes:     Pupils: Pupils are equal, round, and reactive to light.  Cardiovascular:     Rate and Rhythm: Normal rate and regular rhythm.  Pulmonary:     Effort: Pulmonary effort is normal. No respiratory distress.     Breath sounds: Normal breath sounds. No wheezing.  Abdominal:     General: Bowel sounds are normal. There is no distension.     Palpations: Abdomen is soft. There is no mass.     Tenderness: There is no abdominal tenderness. There is no guarding or rebound.  Musculoskeletal:        General: No tenderness. Normal range of motion.     Cervical back: Normal range of motion and neck supple.  Skin:    General: Skin is warm.  Neurological:     Mental Status: She is alert and oriented to person, place, and time.  Psychiatric:        Mood and Affect: Affect normal.      LABORATORY DATA:  I have reviewed the data as listed Lab Results  Component Value Date   WBC 19.4 (H) 10/11/2020   HGB 9.6 (L) 10/11/2020   HCT 27.9 (L) 10/11/2020   MCV 89.4 10/11/2020   PLT 242 10/11/2020   Recent Labs    09/10/20 0846 09/27/20 0848 10/11/20 1053  NA 136 135 135  K 4.0 3.7 3.8  CL 104 104 102  CO2 '22 26 26  ' GLUCOSE 106* 96 100*  BUN 27* 25* 24*  CREATININE 0.64 0.86 0.79  CALCIUM 9.2 8.9 9.0  GFRNONAA >60 >60 >60   PROT 7.0 7.0 7.2  ALBUMIN 3.9 4.0 3.9  AST '18 19 17  ' ALT '11 12 13  ' ALKPHOS 43 74 92  BILITOT 0.4 0.3 0.2*    RADIOGRAPHIC STUDIES: I have personally reviewed the radiological images as listed and agreed with the findings in the report. No results found.  ASSESSMENT & PLAN:   Carcinoma of upper-outer quadrant of right breast in female, estrogen receptor negative ([place]) # Stage I triple negative breast cancer on adjuvant AC- Taxol chemo.  Tolerating chemotherapy with mild to moderate side effects [fatigue nausea mucositis-see below].  # proceed with cycle #3 AC today. Labs today reviewed;  acceptable for treatment today.   #Mucositis-grade 2 secondary to chemotherapy.  Improved on Magic mouthwash.  # Constipation-G-1 continue hyrdration/fibre/ mirlaax.   #Small fiber neuropathy- [Dr.Shah]-on Cymbalta/Lyrica-STABLE.   # Fertility preserving- lupron monthly [last 1/11];   # DISPOSITION:  # chemo today # follow up in 2 weeks- MD; labs- cbc/cmp; Adriamycin- Cytoxan; fluphila-d-2; -Dr.B   All questions were answered. The patient/family knows to call the clinic with any problems, questions or concerns.    Cammie Sickle, MD 10/11/2020 12:16 PM

## 2020-10-11 NOTE — Progress Notes (Signed)
Ok to proceed with tx today per MD, ok with labs/vitals/HR

## 2020-10-12 ENCOUNTER — Inpatient Hospital Stay: Payer: BC Managed Care – PPO

## 2020-10-12 DIAGNOSIS — Z171 Estrogen receptor negative status [ER-]: Secondary | ICD-10-CM

## 2020-10-12 DIAGNOSIS — C50411 Malignant neoplasm of upper-outer quadrant of right female breast: Secondary | ICD-10-CM | POA: Diagnosis not present

## 2020-10-12 MED ORDER — PEGFILGRASTIM-CBQV 6 MG/0.6ML ~~LOC~~ SOSY
6.0000 mg | PREFILLED_SYRINGE | Freq: Once | SUBCUTANEOUS | Status: AC
  Administered 2020-10-12: 6 mg via SUBCUTANEOUS
  Filled 2020-10-12: qty 0.6

## 2020-10-13 ENCOUNTER — Ambulatory Visit: Payer: BC Managed Care – PPO

## 2020-10-14 ENCOUNTER — Inpatient Hospital Stay: Payer: BC Managed Care – PPO

## 2020-10-14 NOTE — Progress Notes (Signed)
Nutrition Follow-up:  Patient with triple negative breast cancer.  Patient s/p mastectomy.  Patient receiving chemotherapy.   Spoke with patient via phone for nutrition follow-up.  Patient reports that nausea has been less after this round of treatment. Denies trouble swallowing as before.  Constipation is better, has dulcolax on hand if needed. Couldn't take miralax.  Drinking [place] for tots apple juice, unsweet decaf tea, chocolate milk.  Has ensure on had but has not drank it yet.  Ate waffle with blueberry cream cheese this am. Supper last night was homemade chicken noodle soup.  Eating yogurt with honey.      Medications: reviewed  Labs: reviewed  Anthropometrics:   Weight 101 lb on 1/17  102 lb 12/17 104 lb on 12/6   NUTRITION DIAGNOSIS: Food and nutrition related knowledge deficit improved   INTERVENTION:  Discussed ways to add calories and protein in current eating pattern. Encouraged taking dulcolax if needed. Discussed adding packet of carnation breakfast essentials to milk for added calories and protein.      MONITORING, EVALUATION, GOAL: weight trends, intake   NEXT VISIT: Feb 17 phone call   Beth Wells, [place], [place] Registered Dietitian [phone] (mobile)

## 2020-10-18 ENCOUNTER — Other Ambulatory Visit: Payer: Self-pay

## 2020-10-18 ENCOUNTER — Emergency Department: Payer: BC Managed Care – PPO

## 2020-10-18 ENCOUNTER — Inpatient Hospital Stay
Admission: EM | Admit: 2020-10-18 | Discharge: 2020-10-21 | DRG: 810 | Disposition: A | Discharge status: Home / Self Care | Payer: BC Managed Care – PPO | Attending: Internal Medicine | Admitting: Internal Medicine

## 2020-10-18 ENCOUNTER — Telehealth: Payer: Self-pay | Admitting: Hematology and Oncology

## 2020-10-18 DIAGNOSIS — R509 Fever, unspecified: Secondary | ICD-10-CM | POA: Diagnosis not present

## 2020-10-18 DIAGNOSIS — G894 Chronic pain syndrome: Secondary | ICD-10-CM | POA: Diagnosis present

## 2020-10-18 DIAGNOSIS — T451X5A Adverse effect of antineoplastic and immunosuppressive drugs, initial encounter: Secondary | ICD-10-CM

## 2020-10-18 DIAGNOSIS — Z9011 Acquired absence of right breast and nipple: Secondary | ICD-10-CM

## 2020-10-18 DIAGNOSIS — Z79899 Other long term (current) drug therapy: Secondary | ICD-10-CM

## 2020-10-18 DIAGNOSIS — Z171 Estrogen receptor negative status [ER-]: Secondary | ICD-10-CM

## 2020-10-18 DIAGNOSIS — D709 Neutropenia, unspecified: Secondary | ICD-10-CM | POA: Diagnosis not present

## 2020-10-18 DIAGNOSIS — K219 Gastro-esophageal reflux disease without esophagitis: Secondary | ICD-10-CM | POA: Diagnosis present

## 2020-10-18 DIAGNOSIS — G603 Idiopathic progressive neuropathy: Secondary | ICD-10-CM | POA: Diagnosis present

## 2020-10-18 DIAGNOSIS — I10 Essential (primary) hypertension: Secondary | ICD-10-CM | POA: Diagnosis present

## 2020-10-18 DIAGNOSIS — D6481 Anemia due to antineoplastic chemotherapy: Secondary | ICD-10-CM | POA: Diagnosis present

## 2020-10-18 DIAGNOSIS — D701 Agranulocytosis secondary to cancer chemotherapy: Secondary | ICD-10-CM

## 2020-10-18 DIAGNOSIS — C50411 Malignant neoplasm of upper-outer quadrant of right female breast: Secondary | ICD-10-CM | POA: Diagnosis present

## 2020-10-18 DIAGNOSIS — R5081 Fever presenting with conditions classified elsewhere: Secondary | ICD-10-CM | POA: Diagnosis present

## 2020-10-18 DIAGNOSIS — Z20822 Contact with and (suspected) exposure to covid-19: Secondary | ICD-10-CM | POA: Diagnosis present

## 2020-10-18 LAB — CBC WITH DIFFERENTIAL/PLATELET
Abs Immature Granulocytes: 0.01 10*3/uL (ref 0.00–0.07)
Basophils Absolute: 0 10*3/uL (ref 0.0–0.1)
Basophils Relative: 6 %
Eosinophils Absolute: 0 10*3/uL (ref 0.0–0.5)
Eosinophils Relative: 6 %
HCT: 25.5 % — ABNORMAL LOW (ref 36.0–46.0)
Hemoglobin: 9 g/dL — ABNORMAL LOW (ref 12.0–15.0)
Immature Granulocytes: 3 %
Lymphocytes Relative: 61 %
Lymphs Abs: 0.2 10*3/uL — ABNORMAL LOW (ref 0.7–4.0)
MCH: 30.8 pg (ref 26.0–34.0)
MCHC: 35.3 g/dL (ref 30.0–36.0)
MCV: 87.3 fL (ref 80.0–100.0)
Monocytes Absolute: 0 10*3/uL — ABNORMAL LOW (ref 0.1–1.0)
Monocytes Relative: 9 %
Neutro Abs: 0.1 10*3/uL — CL (ref 1.7–7.7)
Neutrophils Relative %: 15 %
Platelets: 237 10*3/uL (ref 150–400)
RBC: 2.92 MIL/uL — ABNORMAL LOW (ref 3.87–5.11)
RDW: 12.4 % (ref 11.5–15.5)
Smear Review: NORMAL
WBC: 0.3 10*3/uL — CL (ref 4.0–10.5)
nRBC: 0 % (ref 0.0–0.2)

## 2020-10-18 LAB — URINALYSIS, COMPLETE (UACMP) WITH MICROSCOPIC
Bacteria, UA: NONE SEEN
Bilirubin Urine: NEGATIVE
Glucose, UA: NEGATIVE mg/dL
Hgb urine dipstick: NEGATIVE
Ketones, ur: NEGATIVE mg/dL
Leukocytes,Ua: NEGATIVE
Nitrite: NEGATIVE
Protein, ur: NEGATIVE mg/dL
Specific Gravity, Urine: 1.021 (ref 1.005–1.030)
WBC, UA: NONE SEEN WBC/hpf (ref 0–5)
pH: 7 (ref 5.0–8.0)

## 2020-10-18 LAB — COMPREHENSIVE METABOLIC PANEL
ALT: 10 U/L (ref 0–44)
AST: 16 U/L (ref 15–41)
Albumin: 4.1 g/dL (ref 3.5–5.0)
Alkaline Phosphatase: 105 U/L (ref 38–126)
Anion gap: 10 (ref 5–15)
BUN: 21 mg/dL — ABNORMAL HIGH (ref 6–20)
CO2: 24 mmol/L (ref 22–32)
Calcium: 9.4 mg/dL (ref 8.9–10.3)
Chloride: 99 mmol/L (ref 98–111)
Creatinine, Ser: 0.65 mg/dL (ref 0.44–1.00)
GFR, Estimated: >60 mL/min (ref >60)
Glucose, Bld: 118 mg/dL — ABNORMAL HIGH (ref 70–99)
Potassium: 4.4 mmol/L (ref 3.5–5.1)
Sodium: 133 mmol/L — ABNORMAL LOW (ref 135–145)
Total Bilirubin: 0.5 mg/dL (ref 0.3–1.2)
Total Protein: 7.4 g/dL (ref 6.5–8.1)

## 2020-10-18 LAB — POC SARS CORONAVIRUS 2 AG -  ED: SARS Coronavirus 2 Ag: NEGATIVE

## 2020-10-18 LAB — LACTIC ACID, PLASMA: Lactic Acid, Venous: 1.4 mmol/L (ref 0.5–1.9)

## 2020-10-18 LAB — POC URINE PREG, ED: Preg Test, Ur: NEGATIVE

## 2020-10-18 MED ORDER — SODIUM CHLORIDE 0.9 % IV SOLN
2.0000 g | Freq: Three times a day (TID) | INTRAVENOUS | Status: DC
  Administered 2020-10-19 – 2020-10-21 (×8): 2 g via INTRAVENOUS
  Filled 2020-10-18 (×12): qty 2

## 2020-10-18 MED ORDER — ACETAMINOPHEN 500 MG PO TABS
1000.0000 mg | ORAL_TABLET | Freq: Once | ORAL | Status: DC
  Filled 2020-10-18: qty 2

## 2020-10-18 MED ORDER — LACTATED RINGERS IV BOLUS
1000.0000 mL | Freq: Once | INTRAVENOUS | Status: AC
  Administered 2020-10-19: 1000 mL via INTRAVENOUS

## 2020-10-18 NOTE — Telephone Encounter (Signed)
Re:  Fever and chills  Patient s/p cycle #3 AC.  She describes fever up to 102 associated with chills.  She has had a slight cough and runny nose since initiation of chemotherapy.  She has a slight sore throat.  She denies any nausea, vomiting, diarrhea, or dysuria.  We discussed evaluation in the ER.  Triage contacted.   Lequita Asal, MD

## 2020-10-18 NOTE — ED Provider Notes (Signed)
[facility] Emergency Department Provider Note ____________________________________________   Event Date/Time   First MD Initiated Contact with Patient 10/18/20 2253     (approximate)  I have reviewed the triage vital signs and the nursing notes.  HISTORY  Chief Complaint Fever   HPI Beth Wells is a 45 y.o. femalewho presents to the ED for evaluation of fever.  Chart review indicates history of breast cancer s/p mastectomy and currently on chemotherapy.  HTN.  Patient presents to the ED with her husband, who brought additional history.  Last chemotherapy was 7 days ago.  Patient reports doing well until feeling isolated subjective chills and generalized weakness this afternoon, about 7 hours prior to arrival.  She was checking her temperature orally and noting 102 F, and recheck greater than 101 F.  Due to this, she spoke to the on-call nurse through her oncologist, who recommended ED evaluation.  Past Medical History:  Diagnosis Date  . Breast cancer ([place]) 08/05/2020  . Carcinoma of upper-outer quadrant of right breast in female, estrogen receptor negative ([place]) 07/13/2020  . Complication of anesthesia    hard to wake up-very dizzy feeling like she was going to pass out  . GERD (gastroesophageal reflux disease)    no meds  . H/O foot surgery 08/2018  . History of kidney stones    h/o  . Hypertension   . PONV (postoperative nausea and vomiting)    n/v aftter kidney stone surgery    Patient Active Problem List   Diagnosis Date Noted  . Acquired absence of breast 08/31/2020  . Breast cancer ([place]) 08/05/2020  . BARD1 gene mutation positive 08/03/2020  . Carcinoma of upper-outer quadrant of right breast in female, estrogen receptor negative ([place]) 07/13/2020  . 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 Small fiber neuropathy 07/24/2019  . Paresthesias 07/24/2019  . Chronic pain syndrome 07/24/2019  . Torsion of right ovary and  ovarian pedicle 06/06/2019  . 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 Bilateral foot pain 12/11/2018  . Chromosomal abnormality     Past Surgical History:  Procedure Laterality Date  . BREAST BIOPSY Right 07/06/2020   u/s bx Q clip path pending  . BREAST RECONSTRUCTION WITH PLACEMENT OF TISSUE EXPANDER AND FLEX HD (ACELLULAR HYDRATED DERMIS) Right 08/05/2020   Procedure: RIGHT BREAST RECONSTRUCTION WITH PLACEMENT OF TISSUE EXPANDER AND FLEX HD (ACELLULAR HYDRATED DERMIS);  Surgeon: Wallace Going, DO;  Location: [facility] ORS;  Service: Plastics;  Laterality: Right;  . BUNIONECTOMY Right    titanium pin  . CERVICAL CONE BIOPSY    . DILATION AND EVACUATION N/A 03/12/2018   Procedure: DILATATION AND EVACUATION;  Surgeon: [name], MD;  Location: [facility] ORS;  Service: Gynecology;  Laterality: N/A;  . KIDNEY STONE SURGERY    . LAPAROSCOPIC OVARIAN CYSTECTOMY Right 06/07/2019   Procedure: LAPAROSCOPIC OVARIAN CYSTECTOMY;  Surgeon: [name], MD;  Location: [facility] ORS;  Service: Gynecology;  Laterality: Right;  . LAPAROSCOPY N/A 06/07/2019   Procedure: LAPAROSCOPY Luvenia Redden BIOPSIES;  Surgeon: [name], MD;  Location: [facility] ORS;  Service: Gynecology;  Laterality: N/A;  . PORTACATH PLACEMENT Left 08/05/2020   Procedure: INSERTION PORT-A-CATH;  Surgeon: Herbert Pun, MD;  Location: [facility] ORS;  Service: General;  Laterality: Left;  . TOTAL MASTECTOMY Right 08/05/2020   Procedure: TOTAL MASTECTOMY w/ Sentinel Node;  Surgeon: Herbert Pun, MD;  Location: [facility] ORS;  Service: General;  Laterality: Right;    Prior to Admission medications   Medication Sig Start Date End  Date Taking? Authorizing Provider  acetaminophen (TYLENOL) 500 MG tablet Take 1 tablet (500 mg total) by mouth every 6 (six) hours as needed. For use AFTER surgery 07/29/20   Phoebe Sharps C, PA-C  ALPHA-LIPOIC ACID PO Take 500 mg by mouth daily.     [provider]   amphetamine-dextroamphetamine (ADDERALL XR) 30 MG 24 hr capsule Take 30 mg by mouth daily.  07/22/20 09/10/29  [provider]  DULoxetine (CYMBALTA) 20 MG capsule Take 2 capsules (40 mg total) by mouth at bedtime. Patient taking differently: Take 20 mg by mouth at bedtime. 05/13/20   Gillis Santa, MD  ibuprofen (ADVIL) 600 MG tablet Take 1 tablet (600 mg total) by mouth every 6 (six) hours as needed for mild pain or moderate pain. For use AFTER surgery 07/29/20   Phoebe Sharps C, PA-C  lidocaine-prilocaine (EMLA) cream Apply 1 application topically as needed. On the port generously; 30-45 mins prior to chemo appt. 10/11/20   Cammie Sickle, MD  lisinopril (ZESTRIL) 5 MG tablet Take 5 mg by mouth every evening.  05/22/19   [provider]  magic mouthwash w/lidocaine SOLN Take 5 mLs by mouth. 80 ml viscous lidocaine 2%, 80 ml Mylanta, 80 ml Diphenhydramine 12.5 mg/5 ml Elixir, 80 ml Nystatin 100,000 Unit suspension, 80 ml Prednisolone 15 mg/42m, 80 ml Distilled Water. Sig: Swish/Swallow 5-10 ml four times a day as needed. Dispense 480 ml. 3RFs    [provider]  ondansetron (ZOFRAN) 8 MG tablet One pill every 8 hours as needed for nausea/vomitting. 10/11/20   BCammie Sickle MD  pregabalin (LYRICA) 100 MG capsule Take 1 capsule (100 mg total) by mouth 2 (two) times daily. 08/10/20 02/06/21  LGillis Santa MD  Probiotic Product (PROBIOTIC ADVANCED PO) Take 1 capsule by mouth daily.     [provider]  prochlorperazine (COMPAZINE) 10 MG tablet Take 1 tablet (10 mg total) by mouth every 6 (six) hours as needed for nausea or vomiting. 08/17/20   BCammie Sickle MD  sodium fluoride (FLUORISHIELD) 1.1 % GEL dental gel  09/08/20   [provider]  trolamine salicylate (ASPERCREME) 10 % cream Apply 1 application topically as needed for muscle pain.    [provider]    Allergies Patient has no known allergies.  Family History   Problem Relation Age of Onset  . Cancer Other        great aunt- GI cancer  . Breast cancer Neg Hx     Social History Social History   Tobacco Use  . Smoking status: Never Smoker  . Smokeless tobacco: Never Used  Vaping Use  . Vaping Use: Never used  Substance Use Topics  . Alcohol use: Not Currently  . Drug use: Never    Review of Systems  Constitutional: Positive for fever/chills Eyes: No visual changes. ENT: No sore throat. Cardiovascular: Denies chest pain. Respiratory: Denies shortness of breath. Gastrointestinal: No abdominal pain. no vomiting.  No diarrhea.  No constipation.  Positive for nausea without vomiting. Genitourinary: Negative for dysuria. Musculoskeletal: Negative for back pain. Skin: Negative for rash. Neurological: Negative for headaches, focal weakness or numbness.  ____________________________________________   PHYSICAL EXAM:  VITAL SIGNS: Vitals:   10/18/20 2300 10/18/20 2330  BP: 106/68 106/69  Pulse: 93 91  Resp: 18 18  Temp:    SpO2: 100% 98%     Constitutional: Alert and oriented. Well appearing and in no acute distress. Eyes: Conjunctivae are normal. PERRL. EOMI. Head: Atraumatic.  Alopecia Nose: No congestion/rhinnorhea. Mouth/Throat: Mucous membranes are moist.  Oropharynx non-erythematous. Neck: No stridor. No cervical spine tenderness to palpation. Cardiovascular: Normal rate, regular rhythm. Grossly normal heart sounds.  Good peripheral circulation. Respiratory: Normal respiratory effort.  No retractions. Lungs CTAB. Gastrointestinal: Soft , nondistended, nontender to palpation. No CVA tenderness. Musculoskeletal: No lower extremity tenderness nor edema.  No joint effusions. No signs of acute trauma. Left chest port site, clean, dry and intact. Neurologic:  Normal speech and language. No gross focal neurologic deficits are appreciated. No gait instability noted. Skin:  Skin is warm, dry and intact. No rash  noted. Psychiatric: Mood and affect are normal. Speech and behavior are normal.  ____________________________________________   LABS (all labs ordered are listed, but only abnormal results are displayed)  Labs Reviewed  COMPREHENSIVE METABOLIC PANEL - Abnormal; Notable for the following components:      Result Value   Sodium 133 (*)    Glucose, Bld 118 (*)    BUN 21 (*)    All other components within normal limits  URINALYSIS, COMPLETE (UACMP) WITH MICROSCOPIC - Abnormal; Notable for the following components:   Color, Urine YELLOW (*)    APPearance CLEAR (*)    All other components within normal limits  CBC WITH DIFFERENTIAL/PLATELET - Abnormal; Notable for the following components:   WBC 0.3 (*)    RBC 2.92 (*)    Hemoglobin 9.0 (*)    HCT 25.5 (*)    Neutro Abs 0.1 (*)    Lymphs Abs 0.2 (*)    Monocytes Absolute 0.0 (*)    All other components within normal limits  SARS CORONAVIRUS 2 BY RT PCR (HOSPITAL ORDER, [place] [facility])  CULTURE, BLOOD (ROUTINE X 2)  CULTURE, BLOOD (ROUTINE X 2)  URINE CULTURE  LACTIC ACID, PLASMA  PATHOLOGIST SMEAR REVIEW  POC URINE PREG, ED  POC SARS CORONAVIRUS 2 AG -  ED    ____________________________________________  RADIOLOGY  ED MD interpretation: 2 view CXR reviewed by me without evidence of acute cardiopulmonary pathology.  Official radiology report(s): DG Chest 2 View  Result Date: 10/18/2020 CLINICAL DATA:  Breast cancer patient with fever today. Chills and weakness. Recent mastectomy. EXAM: CHEST - 2 VIEW COMPARISON:  08/05/2020 FINDINGS: Hyperinflation of the lungs. Power port type left central venous catheter with tip over the low SVC region. No pneumothorax. Heart size and pulmonary vascularity are normal. Postoperative right mastectomy with breast reconstruction. Lungs are clear. No airspace disease, consolidation, or edema. No pleural effusions. No pneumothorax. Mediastinal contours appear intact.  IMPRESSION: No active cardiopulmonary disease. Electronically Signed   By: Lucienne Capers M.D.   On: 10/18/2020 23:25    ____________________________________________   PROCEDURES and INTERVENTIONS  Procedure(s) performed (including Critical Care):  .1-3 Lead EKG Interpretation Performed by: Vladimir Crofts, MD Authorized by: Vladimir Crofts, MD     Interpretation: normal     ECG rate:  90   ECG rate assessment: normal     Rhythm: sinus rhythm     Ectopy: none     Conduction: normal      Medications  lactated ringers bolus 1,000 mL (has no administration in time range)  acetaminophen (TYLENOL) tablet 1,000 mg (has no administration in time range)  ceFEPIme (MAXIPIME) 2 g in sodium chloride 0.9 % 100 mL IVPB (has no administration in time range)    ____________________________________________   MDM / ED COURSE   Pleasant and otherwise healthy 45 year old woman undergoing chemotherapy for breast cancer  events to the ED with fever at home, found to have severe neutropenia, requiring blood cultures, IV antibiotics and medical admission.  Patient tachycardic on arrival, self resolving, otherwise normal vitals on room air.  Exam reassuring without evidence of distress, trauma or neurovascular deficits.  She looks well overall.  Blood work, although, shows severe neutropenia with an [place] of 100.  Due to this, and her fever at home, will draw blood cultures and provide IV antibiotics.  Will discuss the case with hospitalist for admission.   ____________________________________________   FINAL CLINICAL IMPRESSION(S) / ED DIAGNOSES  Final diagnoses:  Neutropenic fever [facility])     ED Discharge Orders    None       Carlon Chaloux   Note:  This document was prepared using Dragon voice recognition software and may include unintentional dictation errors.   Vladimir Crofts, MD 10/19/20 [phone]

## 2020-10-18 NOTE — ED Triage Notes (Signed)
Pt is undergoing chemo for breast CA, states noticed fever today. States had chills and weakness, temp at home up to 102.0.

## 2020-10-18 NOTE — ED Notes (Signed)
Patient comes in with c/o fever. Upon arrival patient no longer has fever without taking any medications.

## 2020-10-19 ENCOUNTER — Encounter: Payer: Self-pay | Admitting: Internal Medicine

## 2020-10-19 DIAGNOSIS — Z171 Estrogen receptor negative status [ER-]: Secondary | ICD-10-CM | POA: Diagnosis not present

## 2020-10-19 DIAGNOSIS — Z79899 Other long term (current) drug therapy: Secondary | ICD-10-CM | POA: Diagnosis not present

## 2020-10-19 DIAGNOSIS — R5081 Fever presenting with conditions classified elsewhere: Secondary | ICD-10-CM | POA: Diagnosis present

## 2020-10-19 DIAGNOSIS — G603 Idiopathic progressive neuropathy: Secondary | ICD-10-CM | POA: Diagnosis present

## 2020-10-19 DIAGNOSIS — G894 Chronic pain syndrome: Secondary | ICD-10-CM | POA: Diagnosis present

## 2020-10-19 DIAGNOSIS — K219 Gastro-esophageal reflux disease without esophagitis: Secondary | ICD-10-CM | POA: Diagnosis present

## 2020-10-19 DIAGNOSIS — Z9011 Acquired absence of right breast and nipple: Secondary | ICD-10-CM | POA: Diagnosis not present

## 2020-10-19 DIAGNOSIS — R509 Fever, unspecified: Secondary | ICD-10-CM | POA: Diagnosis present

## 2020-10-19 DIAGNOSIS — I10 Essential (primary) hypertension: Secondary | ICD-10-CM | POA: Diagnosis present

## 2020-10-19 DIAGNOSIS — Z20822 Contact with and (suspected) exposure to covid-19: Secondary | ICD-10-CM | POA: Diagnosis present

## 2020-10-19 DIAGNOSIS — D709 Neutropenia, unspecified: Secondary | ICD-10-CM | POA: Diagnosis present

## 2020-10-19 DIAGNOSIS — T451X5A Adverse effect of antineoplastic and immunosuppressive drugs, initial encounter: Secondary | ICD-10-CM

## 2020-10-19 DIAGNOSIS — D6481 Anemia due to antineoplastic chemotherapy: Secondary | ICD-10-CM | POA: Diagnosis present

## 2020-10-19 DIAGNOSIS — D701 Agranulocytosis secondary to cancer chemotherapy: Secondary | ICD-10-CM

## 2020-10-19 DIAGNOSIS — C50411 Malignant neoplasm of upper-outer quadrant of right female breast: Secondary | ICD-10-CM | POA: Diagnosis present

## 2020-10-19 LAB — CBC
HCT: 21.6 % — ABNORMAL LOW (ref 36.0–46.0)
Hemoglobin: 7.5 g/dL — ABNORMAL LOW (ref 12.0–15.0)
MCH: 31.1 pg (ref 26.0–34.0)
MCHC: 34.7 g/dL (ref 30.0–36.0)
MCV: 89.6 fL (ref 80.0–100.0)
Platelets: 184 10*3/uL (ref 150–400)
RBC: 2.41 MIL/uL — ABNORMAL LOW (ref 3.87–5.11)
RDW: 12.2 % (ref 11.5–15.5)
WBC: 0.6 10*3/uL — CL (ref 4.0–10.5)
nRBC: 0 % (ref 0.0–0.2)

## 2020-10-19 LAB — BASIC METABOLIC PANEL
Anion gap: 12 (ref 5–15)
BUN: 16 mg/dL (ref 6–20)
CO2: 24 mmol/L (ref 22–32)
Calcium: 8.6 mg/dL — ABNORMAL LOW (ref 8.9–10.3)
Chloride: 103 mmol/L (ref 98–111)
Creatinine, Ser: 0.56 mg/dL (ref 0.44–1.00)
GFR, Estimated: >60 mL/min (ref >60)
Glucose, Bld: 95 mg/dL (ref 70–99)
Potassium: 3.6 mmol/L (ref 3.5–5.1)
Sodium: 139 mmol/L (ref 135–145)

## 2020-10-19 LAB — HIV ANTIBODY (ROUTINE TESTING W REFLEX): HIV Screen 4th Generation wRfx: NONREACTIVE

## 2020-10-19 LAB — PATHOLOGIST SMEAR REVIEW

## 2020-10-19 LAB — SARS CORONAVIRUS 2 BY RT PCR (HOSPITAL ORDER, PERFORMED IN ~~LOC~~ HOSPITAL LAB): SARS Coronavirus 2: NEGATIVE

## 2020-10-19 MED ORDER — VANCOMYCIN HCL IN DEXTROSE 1-5 GM/200ML-% IV SOLN
1000.0000 mg | INTRAVENOUS | Status: DC
  Administered 2020-10-19 – 2020-10-21 (×3): 1000 mg via INTRAVENOUS
  Filled 2020-10-19 (×3): qty 200

## 2020-10-19 MED ORDER — AMPHETAMINE-DEXTROAMPHET ER 30 MG PO CP24
30.0000 mg | ORAL_CAPSULE | Freq: Every day | ORAL | Status: DC
Start: 2020-10-19 — End: 2020-10-21
  Administered 2020-10-21: 30 mg via ORAL
  Filled 2020-10-19 (×3): qty 1

## 2020-10-19 MED ORDER — ONDANSETRON HCL 4 MG/2ML IJ SOLN: 4.0000 mg | Freq: Four times a day (QID) | INTRAMUSCULAR | Status: DC | PRN

## 2020-10-19 MED ORDER — SODIUM CHLORIDE 0.9 % IV SOLN: INTRAVENOUS | Status: DC

## 2020-10-19 MED ORDER — MAGIC MOUTHWASH W/LIDOCAINE
5.0000 mL | Freq: Four times a day (QID) | ORAL | Status: DC | PRN
  Filled 2020-10-19: qty 10

## 2020-10-19 MED ORDER — RISAQUAD PO CAPS
1.0000 | ORAL_CAPSULE | Freq: Every day | ORAL | Status: DC
  Administered 2020-10-19 – 2020-10-21 (×3): 1 via ORAL
  Filled 2020-10-19 (×3): qty 1

## 2020-10-19 MED ORDER — ENOXAPARIN SODIUM 40 MG/0.4ML ~~LOC~~ SOLN
40.0000 mg | SUBCUTANEOUS | Status: DC
  Administered 2020-10-19 – 2020-10-21 (×3): 40 mg via SUBCUTANEOUS
  Filled 2020-10-19 (×3): qty 0.4

## 2020-10-19 MED ORDER — ACETAMINOPHEN 325 MG PO TABS
650.0000 mg | ORAL_TABLET | Freq: Four times a day (QID) | ORAL | Status: DC | PRN
  Administered 2020-10-21: 650 mg via ORAL
  Filled 2020-10-19: qty 2

## 2020-10-19 MED ORDER — SODIUM CHLORIDE 0.9 % IV SOLN: 2.0000 g | Freq: Three times a day (TID) | INTRAVENOUS | Status: DC

## 2020-10-19 MED ORDER — DULOXETINE HCL 20 MG PO CPEP
20.0000 mg | ORAL_CAPSULE | Freq: Every day | ORAL | Status: DC
  Administered 2020-10-19 – 2020-10-20 (×2): 20 mg via ORAL
  Filled 2020-10-19 (×3): qty 1

## 2020-10-19 MED ORDER — VANCOMYCIN HCL IN DEXTROSE 1-5 GM/200ML-% IV SOLN
1000.0000 mg | Freq: Once | INTRAVENOUS | Status: DC
  Filled 2020-10-19: qty 200

## 2020-10-19 MED ORDER — PREGABALIN 50 MG PO CAPS
100.0000 mg | ORAL_CAPSULE | Freq: Every day | ORAL | Status: DC
  Administered 2020-10-19 – 2020-10-20 (×2): 100 mg via ORAL
  Filled 2020-10-19: qty 2
  Filled 2020-10-19: qty 4

## 2020-10-19 MED ORDER — PROCHLORPERAZINE MALEATE 10 MG PO TABS
10.0000 mg | ORAL_TABLET | Freq: Four times a day (QID) | ORAL | Status: DC | PRN
  Filled 2020-10-19: qty 1

## 2020-10-19 MED ORDER — OXYCODONE HCL 5 MG PO TABS: 5.0000 mg | ORAL_TABLET | ORAL | Status: DC | PRN

## 2020-10-19 MED ORDER — SODIUM FLUORIDE 1.1 % DT GEL: 1.0000 "application " | Freq: Two times a day (BID) | DENTAL | Status: DC

## 2020-10-19 MED ORDER — TROLAMINE SALICYLATE 10 % EX CREA
1.0000 "application " | TOPICAL_CREAM | CUTANEOUS | Status: DC | PRN
  Filled 2020-10-19: qty 85

## 2020-10-19 NOTE — Progress Notes (Signed)
Brief hospitalist update note.  This is a nonbillable note.  Please see same-day H&P for Damita Dunnings for full billable details.  Briefly, this is a 45 year old female with history significant for triple negative breast CA currently on chemotherapy.  Per last oncology note she is status post cycle 3 AC.  Patient called her oncologist with complaints of fever up to 102 at home associated with chills.  Patient was directed to the emergency room where she was pancultured and started on broad-spectrum antibiotics both vancomycin as well as cefepime.  On my evaluation she is resting comfortably in bed.  She is in no visible distress.  No reported fever since admission.  I have communicated with patient's primary oncologist Dr. Rogue Bussing.  He is aware of patient's admission and has agreed to consult on patient.  I had offered to call patient's family members for an update.  She said no need at this time.  Plan of care was discussed at length with patient at bedside.  All questions answered.  Patient expressed understanding.  Ralene Muskrat MD  No charge

## 2020-10-19 NOTE — H&P (Signed)
History and Physical    [name] [id number] DOB: 16-Aug-1976 DOA: 10/18/2020  PCP: [name]   Patient coming from: Home  I have personally briefly reviewed patient's old medical records in [place]  Chief Complaint: Chills  HPI: Beth Wells is a 45 y.o. female with medical history significant for triple negative right breast cancer on adjuvant chemotherapy, followed by Dr. Rogue Bussing, recently treated for mucositis with Magic mouthwash who presents to the emergency room after sudden onset of chills with temperature of 102 at home. She spoke with Dr. Rogue Bussing who directed her to be evaluated in the emergency room. She denies cough or shortness of breath, abdominal pain, nausea or vomiting or diarrhea and denies dysuria. Denies headache and visual disturbance. ED Course: On arrival in the emergency room she was afebrile at 98.8, BP 116/72, pulse 86 with O2 sat 100% on room air. Blood work significant for WBC of 300 with an [place] of 100. Hemoglobin 9.0 which is her baseline and platelets normal at 230 7K. Lactic acid 1.4. Urinalysis sterile. EKG as reviewed by me : Normal sinus rhythm at 98 Imaging: Chest x-ray clear. No acute disease  Patient started on IV cefepime and a fluid bolus and hospitalist consulted for admission.  Review of Systems: As per HPI otherwise all other systems on review of systems negative.    Past Medical History:  Diagnosis Date  . Breast cancer ([place]) 08/05/2020  . Carcinoma of upper-outer quadrant of right breast in female, estrogen receptor negative ([place]) 07/13/2020  . Complication of anesthesia    hard to wake up-very dizzy feeling like she was going to pass out  . GERD (gastroesophageal reflux disease)    no meds  . H/O foot surgery 08/2018  . History of kidney stones    h/o  . Hypertension   . PONV (postoperative nausea and vomiting)    n/v aftter kidney stone surgery    Past Surgical History:  Procedure Laterality  Date  . BREAST BIOPSY Right 07/06/2020   u/s bx Q clip path pending  . BREAST RECONSTRUCTION WITH PLACEMENT OF TISSUE EXPANDER AND FLEX HD (ACELLULAR HYDRATED DERMIS) Right 08/05/2020   Procedure: RIGHT BREAST RECONSTRUCTION WITH PLACEMENT OF TISSUE EXPANDER AND FLEX HD (ACELLULAR HYDRATED DERMIS);  Surgeon: Wallace Going, DO;  Location: [facility] ORS;  Service: Plastics;  Laterality: Right;  . BUNIONECTOMY Right    titanium pin  . CERVICAL CONE BIOPSY    . DILATION AND EVACUATION N/A 03/12/2018   Procedure: DILATATION AND EVACUATION;  Surgeon: [name], MD;  Location: [facility] ORS;  Service: Gynecology;  Laterality: N/A;  . KIDNEY STONE SURGERY    . LAPAROSCOPIC OVARIAN CYSTECTOMY Right 06/07/2019   Procedure: LAPAROSCOPIC OVARIAN CYSTECTOMY;  Surgeon: [name], MD;  Location: [facility] ORS;  Service: Gynecology;  Laterality: Right;  . LAPAROSCOPY N/A 06/07/2019   Procedure: LAPAROSCOPY Luvenia Redden BIOPSIES;  Surgeon: [name], MD;  Location: [facility] ORS;  Service: Gynecology;  Laterality: N/A;  . PORTACATH PLACEMENT Left 08/05/2020   Procedure: INSERTION PORT-A-CATH;  Surgeon: Herbert Pun, MD;  Location: [facility] ORS;  Service: General;  Laterality: Left;  . TOTAL MASTECTOMY Right 08/05/2020   Procedure: TOTAL MASTECTOMY w/ Sentinel Node;  Surgeon: Herbert Pun, MD;  Location: [facility] ORS;  Service: General;  Laterality: Right;     reports that she has never smoked. She has never used smokeless tobacco. She reports previous alcohol use. She reports that she does not use drugs.  No Known Allergies  Family  History  Problem Relation Age of Onset  . Cancer Other        great aunt- GI cancer  . Breast cancer Neg Hx       Prior to Admission medications   Medication Sig Start Date End Date Taking? Authorizing Provider  acetaminophen (TYLENOL) 500 MG tablet Take 1 tablet (500 mg total) by mouth every 6 (six) hours as needed. For use AFTER surgery 07/29/20    Phoebe Sharps C, PA-C  ALPHA-LIPOIC ACID PO Take 500 mg by mouth daily.     [provider]  amphetamine-dextroamphetamine (ADDERALL XR) 30 MG 24 hr capsule Take 30 mg by mouth daily.  07/22/20 09/10/29  [provider]  DULoxetine (CYMBALTA) 20 MG capsule Take 2 capsules (40 mg total) by mouth at bedtime. Patient taking differently: Take 20 mg by mouth at bedtime. 05/13/20   Gillis Santa, MD  ibuprofen (ADVIL) 600 MG tablet Take 1 tablet (600 mg total) by mouth every 6 (six) hours as needed for mild pain or moderate pain. For use AFTER surgery 07/29/20   Phoebe Sharps C, PA-C  lidocaine-prilocaine (EMLA) cream Apply 1 application topically as needed. On the port generously; 30-45 mins prior to chemo appt. 10/11/20   Cammie Sickle, MD  lisinopril (ZESTRIL) 5 MG tablet Take 5 mg by mouth every evening.  05/22/19   [provider]  magic mouthwash w/lidocaine SOLN Take 5 mLs by mouth. 80 ml viscous lidocaine 2%, 80 ml Mylanta, 80 ml Diphenhydramine 12.5 mg/5 ml Elixir, 80 ml Nystatin 100,000 Unit suspension, 80 ml Prednisolone 15 mg/60ml, 80 ml Distilled Water. Sig: Swish/Swallow 5-10 ml four times a day as needed. Dispense 480 ml. 3RFs    [provider]  ondansetron (ZOFRAN) 8 MG tablet One pill every 8 hours as needed for nausea/vomitting. 10/11/20   Cammie Sickle, MD  pregabalin (LYRICA) 100 MG capsule Take 1 capsule (100 mg total) by mouth 2 (two) times daily. 08/10/20 02/06/21  Gillis Santa, MD  Probiotic Product (PROBIOTIC ADVANCED PO) Take 1 capsule by mouth daily.     [provider]  prochlorperazine (COMPAZINE) 10 MG tablet Take 1 tablet (10 mg total) by mouth every 6 (six) hours as needed for nausea or vomiting. 08/17/20   Cammie Sickle, MD  sodium fluoride (FLUORISHIELD) 1.1 % GEL dental gel  09/08/20   [provider]  trolamine salicylate (ASPERCREME) 10 % cream Apply 1 application topically as needed for muscle  pain.    [provider]    Physical Exam: Vitals:   10/18/20 2230 10/18/20 2300 10/18/20 2330 10/19/20 0000  BP: 104/73 106/68 106/69 107/71  Pulse: 92 93 91 97  Resp:  18 18   Temp:      TempSrc:      SpO2: 100% 100% 98% 98%  Weight:      Height:         Vitals:   10/18/20 2230 10/18/20 2300 10/18/20 2330 10/19/20 0000  BP: 104/73 106/68 106/69 107/71  Pulse: 92 93 91 97  Resp:  18 18   Temp:      TempSrc:      SpO2: 100% 100% 98% 98%  Weight:      Height:          Constitutional: Alert and oriented x 3 .  Frail-appearing.  Not in any apparent distress HEENT:      Head: Normocephalic and atraumatic.         Eyes: PERLA, EOMI, Conjunctivae  are normal. Sclera is non-icteric.       Mouth/Throat: Mucous membranes are moist.       Neck: Supple with no signs of meningismus. Cardiovascular: Regular rate and rhythm. No murmurs, gallops, or rubs. 2+ symmetrical distal pulses are present . No JVD. No LE edema Respiratory: Respiratory effort normal .Lungs sounds clear bilaterally. No wheezes, crackles, or rhonchi.  Gastrointestinal: Soft, non tender, and non distended with positive bowel sounds.  Genitourinary: No CVA tenderness. Musculoskeletal: Nontender with normal range of motion in all extremities. No cyanosis, or erythema of extremities. Neurologic:  Face is symmetric. Moving all extremities. No gross focal neurologic deficits . Skin: Skin is warm, dry.  No rash or ulcers Psychiatric: Mood and affect are normal    Labs on Admission: I have personally reviewed following labs and imaging studies  CBC: Recent Labs  Lab 10/18/20 2156  WBC 0.3*  NEUTROABS 0.1*  HGB 9.0*  HCT 25.5*  MCV 87.3  PLT 202   Basic Metabolic Panel: Recent Labs  Lab 10/18/20 2156  NA 133*  K 4.4  CL 99  CO2 24  GLUCOSE 118*  BUN 21*  CREATININE 0.65  CALCIUM 9.4   GFR: Estimated Creatinine Clearance: 66.3 mL/min (by C-G formula based on SCr of 0.65 mg/dL). Liver  Function Tests: Recent Labs  Lab 10/18/20 2156  AST 16  ALT 10  ALKPHOS 105  BILITOT 0.5  PROT 7.4  ALBUMIN 4.1   No results for input(s): LIPASE, AMYLASE in the last 168 hours. No results for input(s): AMMONIA in the last 168 hours. Coagulation Profile: No results for input(s): INR, PROTIME in the last 168 hours. Cardiac Enzymes: No results for input(s): CKTOTAL, CKMB, CKMBINDEX, TROPONINI in the last 168 hours. BNP (last 3 results) No results for input(s): PROBNP in the last 8760 hours. HbA1C: No results for input(s): HGBA1C in the last 72 hours. CBG: No results for input(s): GLUCAP in the last 168 hours. Lipid Profile: No results for input(s): CHOL, HDL, LDLCALC, TRIG, CHOLHDL, LDLDIRECT in the last 72 hours. Thyroid Function Tests: No results for input(s): TSH, T4TOTAL, FREET4, T3FREE, THYROIDAB in the last 72 hours. Anemia Panel: No results for input(s): VITAMINB12, FOLATE, FERRITIN, TIBC, IRON, RETICCTPCT in the last 72 hours. Urine analysis:    Component Value Date/Time   COLORURINE YELLOW (A) 10/18/2020 2156   APPEARANCEUR CLEAR (A) 10/18/2020 2156   LABSPEC 1.021 10/18/2020 2156   PHURINE 7.0 10/18/2020 2156   GLUCOSEU NEGATIVE 10/18/2020 2156   HGBUR NEGATIVE 10/18/2020 2156   BILIRUBINUR NEGATIVE 10/18/2020 2156   [place] NEGATIVE 10/18/2020 2156   PROTEINUR NEGATIVE 10/18/2020 2156   NITRITE NEGATIVE 10/18/2020 2156   LEUKOCYTESUR NEGATIVE 10/18/2020 2156    Radiological Exams on Admission: DG Chest 2 View  Result Date: 10/18/2020 CLINICAL DATA:  Breast cancer patient with fever today. Chills and weakness. Recent mastectomy. EXAM: CHEST - 2 VIEW COMPARISON:  08/05/2020 FINDINGS: Hyperinflation of the lungs. Power port type left central venous catheter with tip over the low SVC region. No pneumothorax. Heart size and pulmonary vascularity are normal. Postoperative right mastectomy with breast reconstruction. Lungs are clear. No airspace disease,  consolidation, or edema. No pleural effusions. No pneumothorax. Mediastinal contours appear intact. IMPRESSION: No active cardiopulmonary disease. Electronically Signed   By: Lucienne Capers M.D.   On: 10/18/2020 23:25     Assessment/Plan 45 year old female with history of triple negative right breast cancer on adjuvant chemotherapy, followed by Dr. Rogue Bussing, and recently treated for mucositis presenting with  sudden onset of chills with temperature of 102 at home.     Neutropenic fever ([place])   Leukopenia due to antineoplastic chemotherapy ([place])   Carcinoma of upper-outer quadrant of right breast in female, estrogen receptor negative ([place]) -Patient presenting with fever of 102 at home with history of recent mucositis treated with Magic mouthwash with ANC of 100 and no signs of acute infection on work-up with clear chest x-ray and normal urinalysis -IV cefepime. Will add IV vancomycin to cover for MRSA in view of recent mucositis -IV hydration -Oncology consult    DVT prophylaxis: Lovenox  Code Status: full code  Family Communication:  none  Disposition Plan: Back to previous home environment Consults called: none  Status:At the time of admission, it appears that the appropriate admission status for this patient is INPATIENT. This is judged to be reasonable and necessary in order to provide the required intensity of service to ensure the patient's safety given the presenting symptoms, physical exam findings, and initial radiographic and laboratory data in the context of their  Comorbid conditions.   Patient requires inpatient status due to high intensity of service, high risk for further deterioration and high frequency of surveillance required.   I certify that at the point of admission it is my clinical judgment that the patient will require inpatient hospital care spanning beyond [place] MD [facility]     10/19/2020, 1:44 AM

## 2020-10-19 NOTE — ED Notes (Signed)
Pt sitting up in bed with meal tray at bedside, MD at bedside at this time.

## 2020-10-19 NOTE — ED Notes (Signed)
Pt sitting up in bed eating breakfast at this time. Pt provided with menu to order meal tray at this time. Pt denies further needs. Call bell within reach of patient.

## 2020-10-19 NOTE — ED Notes (Signed)
Pt up to go to the bathroom at this time. 

## 2020-10-19 NOTE — Consult Note (Signed)
[place] CONSULT NOTE  Patient Care Team: [place], [name], [name]  CHIEF COMPLAINTS/PURPOSE OF CONSULTATION: Breast cancer on chemotherapy/neutropenic fever.  HISTORY OF PRESENTING ILLNESS:  Beth Wells 45 y.o.  female pleasant patient with stage I breast cancer currently adjuvant chemotherapy is currently admitted to hospital for neutropenic fever.  1 week ago patient underwent Adriamycin/Cytoxan chemotherapy second #3.  Patient noted to have fever of 102 yesterday; which led admission to the hospital.  Denies any significant chills.  Denies any nausea vomiting or cough.  Denies any shortness of breath.  No dysuria.   Patient chest x-ray negative.  UA negative.  Patient is currently on broad-spectrum antibiotics vancomycin plus cefepime.   Review of Systems  Constitutional: Positive for malaise/fatigue. Negative for chills, diaphoresis, fever and weight loss.  HENT: Negative for nosebleeds and sore throat.   Eyes: Negative for double vision.  Respiratory: Negative for cough, hemoptysis, sputum production, shortness of breath and wheezing.   Cardiovascular: Negative for chest pain, palpitations, orthopnea and leg swelling.  Gastrointestinal: Negative for abdominal pain, blood in stool, constipation, diarrhea, heartburn, melena, nausea and vomiting.       Intermittent mild difficulty swallowing.  Genitourinary: Negative for dysuria, frequency and urgency.  Musculoskeletal: Negative for back pain and joint pain.  Skin: Negative.  Negative for itching and rash.  Neurological: Negative for dizziness, tingling, focal weakness, weakness and headaches.  Endo/Heme/Allergies: Does not bruise/bleed easily.  Psychiatric/Behavioral: Negative for depression. The patient is not nervous/anxious and does not have insomnia.      MEDICAL HISTORY:  Past Medical History:  Diagnosis Date  . Breast cancer  ([place]) 08/05/2020  . Carcinoma of upper-outer quadrant of right breast in female, estrogen receptor negative ([facility]) 07/13/2020  . Complication of anesthesia    hard to wake up-very dizzy feeling like she was going to pass out  . GERD (gastroesophageal reflux disease)    no meds  . H/O foot surgery 08/2018  . History of kidney stones    h/o  . Hypertension   . PONV (postoperative nausea and vomiting)    n/v aftter kidney stone surgery    SURGICAL HISTORY: Past Surgical History:  Procedure Laterality Date  . BREAST BIOPSY Right 07/06/2020   u/s bx Q clip path pending  . BREAST RECONSTRUCTION WITH PLACEMENT OF TISSUE EXPANDER AND FLEX HD (ACELLULAR HYDRATED DERMIS) Right 08/05/2020   Procedure: RIGHT BREAST RECONSTRUCTION WITH PLACEMENT OF TISSUE EXPANDER AND FLEX HD (ACELLULAR HYDRATED DERMIS);  Surgeon: Wallace Going, DO;  Location: [facility] ORS;  Service: Plastics;  Laterality: Right;  . BUNIONECTOMY Right    titanium pin  . CERVICAL CONE BIOPSY    . DILATION AND EVACUATION N/A 03/12/2018   Procedure: DILATATION AND EVACUATION;  Surgeon: [name], MD;  Location: [facility] ORS;  Service: Gynecology;  Laterality: N/A;  . KIDNEY STONE SURGERY    . LAPAROSCOPIC OVARIAN CYSTECTOMY Right 06/07/2019   Procedure: LAPAROSCOPIC OVARIAN CYSTECTOMY;  Surgeon: [name], MD;  Location: [facility] ORS;  Service: Gynecology;  Laterality: Right;  . LAPAROSCOPY N/A 06/07/2019   Procedure: LAPAROSCOPY Luvenia Redden BIOPSIES;  Surgeon: [name], MD;  Location: [facility] ORS;  Service: Gynecology;  Laterality: N/A;  . PORTACATH PLACEMENT Left 08/05/2020   Procedure: INSERTION PORT-A-CATH;  Surgeon: Herbert Pun, MD;  Location: [facility] OR[facility];  Service: General;  Laterality: Left;  . TOTAL MASTECTOMY Right 08/05/2020   Procedure: TOTAL MASTECTOMY w/ Sentinel Node;  Surgeon:  Herbert Pun, MD;  Location: [facility] ORS;  Service: General;  Laterality: Right;    SOCIAL  HISTORY: Social History   Socioeconomic History  . Marital status: Married    Spouse name: [name]  . Number of children: 0  . Years of education: Not on file  . Highest education level: Not on file  Occupational History  . Occupation: Market researcher: [employer]  Tobacco Use  . Smoking status: Never Smoker  . Smokeless tobacco: Never Used  Vaping Use  . Vaping Use: Never used  Substance and Sexual Activity  . Alcohol use: Not Currently  . Drug use: Never  . Sexual activity: Yes    Birth control/protection: None  Other Topics Concern  . 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 Lives in [place].    Social Determinants of Health   Financial Resource Strain: Low Risk   . Difficulty of Paying Living Expenses: Not hard at all  Food Insecurity: Not on file  Transportation Needs: No Transportation Needs  . Lack of Transportation (Medical): No  . Lack of Transportation (Non-Medical): No  Physical Activity: Not on file  Stress: Stress Concern Present  . Feeling of Stress : Very much  Social Connections: Not on file  Intimate Partner Violence: Not on file    FAMILY HISTORY: Family History  Problem Relation Age of Onset  . Cancer Other        great aunt- GI cancer  . Breast cancer Neg Hx     ALLERGIES:  has No Known Allergies.  MEDICATIONS:  Current Facility-Administered Medications  Medication Dose Route Frequency Provider Last Rate Last Admin  . 0.9 %  sodium chloride infusion   Intravenous Continuous Ralene Muskrat B, MD 100 mL/hr at 10/19/20 0835 Rate Change at 10/19/20 0835  . acetaminophen (TYLENOL) tablet 1,000 mg  1,000 mg Oral Once Vladimir Crofts, MD      . acetaminophen (TYLENOL) tablet 650 mg  650 mg Oral Q6H PRN Priscella Mann, Sudheer B, MD      . acidophilus (RISAQUAD) capsule 1 capsule  1 capsule Oral Daily Ralene Muskrat B, MD   1 capsule at  10/19/20 0929  . amphetamine-dextroamphetamine (ADDERALL XR) 24 hr capsule 30 mg  30 mg Oral Daily Sreenath, Sudheer B, MD      . ceFEPIme (MAXIPIME) 2 g in sodium chloride 0.9 % 100 mL IVPB  2 g Intravenous Q8H Vladimir Crofts, MD   Stopped at 10/19/20 0830  . DULoxetine (CYMBALTA) DR capsule 20 mg  20 mg Oral QHS Sreenath, Sudheer B, MD      . enoxaparin (LOVENOX) injection 40 mg  40 mg Subcutaneous Q24H Athena Masse, MD   40 mg at 10/19/20 0929  . magic mouthwash w/lidocaine  5-10 mL Oral QID PRN Ralene Muskrat B, MD      . ondansetron (ZOFRAN) injection 4 mg  4 mg Intravenous Q6H PRN Sreenath, Sudheer B, MD      . oxyCODONE (Oxy IR/ROXICODONE) immediate release tablet 5 mg  5 mg Oral Q4H PRN Sreenath, Sudheer B, MD      . pregabalin (LYRICA) capsule 100 mg  100 mg Oral Daily Sreenath, Sudheer B, MD      . prochlorperazine (COMPAZINE) tablet 10 mg  10 mg Oral Q6H PRN Sreenath, Sudheer B, MD      . trolamine salicylate (ASPERCREME) 10 % cream 1 application  1  application Topical PRN Ralene Muskrat B, MD      . vancomycin (VANCOCIN) IVPB 1000 mg/200 mL premix  1,000 mg Intravenous Q24H Renda Rolls, [facility]   Stopped at 10/19/20 [id number]   Current Outpatient Medications  Medication Sig Dispense Refill  . acetaminophen (TYLENOL) 500 MG tablet Take 1 tablet (500 mg total) by mouth every 6 (six) hours as needed. For use AFTER surgery 30 tablet 0  . ALPHA-LIPOIC ACID PO Take 500 mg by mouth daily.     Marland Kitchen amphetamine-dextroamphetamine (ADDERALL XR) 30 MG 24 hr capsule Take 30 mg by mouth daily.     . DULoxetine (CYMBALTA) 20 MG capsule Take 2 capsules (40 mg total) by mouth at bedtime. (Patient taking differently: Take 20 mg by mouth at bedtime.) 60 capsule 2  . lidocaine-prilocaine (EMLA) cream Apply 1 application topically as needed. On the port generously; 30-45 mins prior to chemo appt. 30 g 3  . lisinopril (ZESTRIL) 5 MG tablet Take 5 mg by mouth every evening.     . magic mouthwash w/lidocaine  SOLN Take 5 mLs by mouth. 80 ml viscous lidocaine 2%, 80 ml Mylanta, 80 ml Diphenhydramine 12.5 mg/5 ml Elixir, 80 ml Nystatin 100,000 Unit suspension, 80 ml Prednisolone 15 mg/66ml, 80 ml Distilled Water. Sig: Swish/Swallow 5-10 ml four times a day as needed. Dispense 480 ml. 3RFs    . ondansetron (ZOFRAN) 8 MG tablet One pill every 8 hours as needed for nausea/vomitting. 40 tablet 3  . pregabalin (LYRICA) 100 MG capsule Take 1 capsule (100 mg total) by mouth 2 (two) times daily. 180 capsule 1  . Probiotic Product (PROBIOTIC ADVANCED PO) Take 1 capsule by mouth daily.     . prochlorperazine (COMPAZINE) 10 MG tablet Take 1 tablet (10 mg total) by mouth every 6 (six) hours as needed for nausea or vomiting. 40 tablet 1  . sodium fluoride (FLUORISHIELD) 1.1 % GEL dental gel Place 1 application onto teeth 2 (two) times daily.    Marland Kitchen trolamine salicylate (ASPERCREME) 10 % cream Apply 1 application topically as needed for muscle pain.        Marland Kitchen  PHYSICAL EXAMINATION:  Vitals:   10/19/20 1330 10/19/20 1401  BP: 113/79 114/77  Pulse: 87 87  Resp:  14  Temp:    SpO2: 98% 97%   Filed Weights   10/18/20 2150  Weight: 102 lb (46.3 kg)    Physical Exam Constitutional:      Comments: Nontoxic.  HENT:     Head: Normocephalic and atraumatic.     Mouth/Throat:     Mouth: Oropharynx is clear and moist.     Pharynx: No oropharyngeal exudate.  Eyes:     Pupils: Pupils are equal, round, and reactive to light.  Cardiovascular:     Rate and Rhythm: Normal rate and regular rhythm.  Pulmonary:     Effort: No respiratory distress.     Breath sounds: No wheezing.     Comments: Clear to auscultation bilaterally.  No wheeze or crackles Abdominal:     General: Bowel sounds are normal. There is no distension.     Palpations: Abdomen is soft. There is no mass.     Tenderness: There is no abdominal tenderness. There is no guarding or rebound.  Musculoskeletal:        General: No tenderness or edema.  Normal range of motion.     Cervical back: Normal range of motion and neck supple.  Skin:    General: Skin is  warm.  Neurological:     Mental Status: She is alert and oriented to person, place, and time.  Psychiatric:        Mood and Affect: Affect normal.      LABORATORY DATA:  I have reviewed the data as listed Lab Results  Component Value Date   WBC 0.6 (LL) 10/19/2020   HGB 7.5 (L) 10/19/2020   HCT 21.6 (L) 10/19/2020   MCV 89.6 10/19/2020   PLT 184 10/19/2020   Recent Labs    09/27/20 0848 10/11/20 1053 10/18/20 2156 10/19/20 0458  NA 135 135 133* 139  K 3.7 3.8 4.4 3.6  CL 104 102 99 103  CO2 26 26 24 24   GLUCOSE 96 100* 118* 95  BUN 25* 24* 21* 16  CREATININE 0.86 0.79 0.65 0.56  CALCIUM 8.9 9.0 9.4 8.6*  GFRNONAA >60 >60 >60 >60  PROT 7.0 7.2 7.4  --   ALBUMIN 4.0 3.9 4.1  --   AST 19 17 16   --   ALT 12 13 10   --   ALKPHOS 74 92 105  --   BILITOT 0.3 0.2* 0.5  --     RADIOGRAPHIC STUDIES: I have personally reviewed the radiological images as listed and agreed with the findings in the report. DG Chest 2 View  Result Date: 10/18/2020 CLINICAL DATA:  Breast cancer patient with fever today. Chills and weakness. Recent mastectomy. EXAM: CHEST - 2 VIEW COMPARISON:  08/05/2020 FINDINGS: Hyperinflation of the lungs. Power port type left central venous catheter with tip over the low SVC region. No pneumothorax. Heart size and pulmonary vascularity are normal. Postoperative right mastectomy with breast reconstruction. Lungs are clear. No airspace disease, consolidation, or edema. No pleural effusions. No pneumothorax. Mediastinal contours appear intact. IMPRESSION: No active cardiopulmonary disease. Electronically Signed   By: Lucienne Capers M.D.   On: 10/18/2020 23:25    Neutropenic fever [facility]) #45 year old female patient with stage I triple negative breast cancer currently admitted to hospital for fever/severe neutropenia  #Neutropenic fever-s/p chemo day #8  today. question source.  Patient status post growth factor post chemotherapy.  On broad-spectrum antibiotics.  #Right breast cancer stage I-on adjuvant chemotherapy Adriamycin Cytoxan x3.   #Mucositis-mild pain with swallowing secondary chemotherapy.  Want to hold off Magic mouthwash  Recommendation:   #Continue management with broad-spectrum antibiotics.  Await blood cultures.  #Discussed with the patient that it may take up to 2-3 more days for count recovery.  If patient is afebrile for 24 hours; ANC greater than 1000-patient could be discharged home on oral antibiotic for total of 7 days.  Thank you Dr.Sreenath for allowing me to participate in the care of your pleasant patient. Please do not hesitate to contact me with questions or concerns in the interim.  All questions were answered. The patient knows to call the clinic with any problems, questions or concerns.    Cammie Sickle, MD 10/19/2020 2:29 PM

## 2020-10-19 NOTE — Progress Notes (Signed)
Pharmacy Antibiotic Note  Beth Wells is a 45 y.o. female admitted on 10/18/2020 with febrile neutropenia.  Pharmacy has been consulted for Vancomycin dosing.  Pt is also currently on Cefepime.  Pt has hx of breast cancer s/p mastectomy and currently on chemotherapy and HTN. Upon arrival to ER pt Temp = 102.  Plan: Vancomycin 1000 mg IV Q 24 hrs. Goal AUC 400-550. Expected AUC: 416 TBW 46.3kg < IBW 50.1kg SCr used: 0.65  Pharmacy will continue to monitor for culture results. Pharmacy will continue to follow SCr and will order Vanc level and/or adjust dose if warranted.  Height: 5\' 2"  (157.5 cm) Weight: 46.3 kg (102 lb) IBW/kg (Calculated) : 50.1  Temp (24hrs), Avg:98.8 F (37.1 C), Min:98.8 F (37.1 C), Max:98.8 F (37.1 C)  Recent Labs  Lab 10/18/20 2156  WBC 0.3*  CREATININE 0.65  LATICACIDVEN 1.4    Estimated Creatinine Clearance: 66.3 mL/min (by C-G formula based on SCr of 0.65 mg/dL).    No Known Allergies  Antimicrobials this admission: 01/25 Vancomycin >>  01/25 Cefepime >>    Microbiology results: 01/24 BCx: Pending 01/25 UCx: Pending   Thank you for allowing pharmacy to be a part of this patient's care.  Renda Rolls, PharmD, [facility] 10/19/2020 2:37 AM

## 2020-10-19 NOTE — Assessment & Plan Note (Addendum)
#  45 year old female patient with stage I triple negative breast cancer currently admitted to hospital for fever/severe neutropenia  #Neutropenic fever-s/p chemo day #10 today. question source.  Patient status post growth factor post chemotherapy.  Currently on vancomycin plus cefepime.  Currently afebrile.  White count today is 4.  ANC normal.  Patient okay to be discharged with Levaquin for total 7 days.  #Severe anemia hemoglobin 6.6-from chemotherapy; this morning hemoglobin 7.7.  As patient is clinically improving-hold off any PRBC transfusion.  #Right breast cancer stage I-on adjuvant chemotherapy Adriamycin Cytoxan x3; discussed with the patient that unfortunately neutropenic fever could still happen with subsequent doses of chemotherapy.  However less likely to happen with Taxol.  Will review with the patient regarding treatment plan at next visit on 1/31. Discussed with Dr. Priscella Mann.

## 2020-10-20 ENCOUNTER — Encounter: Payer: Self-pay | Admitting: Internal Medicine

## 2020-10-20 ENCOUNTER — Other Ambulatory Visit: Payer: Self-pay

## 2020-10-20 DIAGNOSIS — D709 Neutropenia, unspecified: Secondary | ICD-10-CM | POA: Diagnosis not present

## 2020-10-20 DIAGNOSIS — R5081 Fever presenting with conditions classified elsewhere: Secondary | ICD-10-CM | POA: Diagnosis not present

## 2020-10-20 LAB — URINE CULTURE: Culture: <10000 — AB

## 2020-10-20 LAB — BASIC METABOLIC PANEL
Anion gap: 8 (ref 5–15)
BUN: 7 mg/dL (ref 6–20)
CO2: 24 mmol/L (ref 22–32)
Calcium: 8.4 mg/dL — ABNORMAL LOW (ref 8.9–10.3)
Chloride: 106 mmol/L (ref 98–111)
Creatinine, Ser: 0.63 mg/dL (ref 0.44–1.00)
GFR, Estimated: >60 mL/min (ref >60)
Glucose, Bld: 96 mg/dL (ref 70–99)
Potassium: 3.6 mmol/L (ref 3.5–5.1)
Sodium: 138 mmol/L (ref 135–145)

## 2020-10-20 NOTE — Progress Notes (Signed)
   10/20/20 1345  Clinical Encounter Type  Visited With Patient  Visit Type Initial;Spiritual support;Social support  Referral From Nurse  Consult/Referral To Chaplain  Spiritual Encounters  Spiritual Needs Prayer;Emotional  Chaplain Autumn Patty reported to room [place], Beth Wells. I went over the AD with Pt and left it with her. I advised Pt if she have anymore questions feel free to call our Spiritual care department. Prayer was given and received

## 2020-10-20 NOTE — Plan of Care (Signed)

## 2020-10-20 NOTE — Progress Notes (Signed)
PROGRESS NOTE    Beth Wells  FXO:[id number] DOB: Aug 15, 1976 DOA: 10/18/2020 PCP: [facility]  Brief Narrative:  45 year old female with history significant for triple negative breast CA currently on chemotherapy.  Per last oncology note she is status post cycle 3 AC.  Patient called her oncologist with complaints of fever up to 102 at home associated with chills.  Patient was directed to the emergency room where she was pancultured and started on broad-spectrum antibiotics both vancomycin as well as cefepime.  On my evaluation she is resting comfortably in bed.  She is in no visible distress.  No reported fever since admission.  I have communicated with patient's primary oncologist Dr. Donneta Romberg who agreed to consult on the patient.  Recommendations are appreciated  Patient remained fever free and hemodynamically stable throughout the course of admission.  On 10/20/2020 she had a drop in hemoglobin to 6.6.  Given her immunocompromise status and lack of overt bleeding and hemodynamic stability will elect to defer transfusion at this time.    Assessment & Plan:   Principal Problem:   Neutropenic fever (HCC) Active Problems:   Carcinoma of upper-outer quadrant of right breast in female, estrogen receptor negative (HCC)   Leukopenia due to antineoplastic chemotherapy [facility])  45 year old female with history of triple negative right breast cancer on adjuvant chemotherapy, followed by Dr. Donneta Romberg, and recently treated for mucositis presenting with sudden onset of chills with temperature of 102 at home.     Neutropenic fever (HCC)   Leukopenia due to antineoplastic chemotherapy (HCC)   Carcinoma of upper-outer quadrant of right breast in female, estrogen receptor negative (HCC) -Patient presenting with fever of 102 at home with history of recent mucositis  -Started on broad-spectrum antibiotics on admission -Remained hemodynamically stable and afebrile -Blood cultures no  growth to date -Discussed with oncology Plan: Continue broad-spectrum IV antibiotics, vancomycin and cefepime Intravenous hydration Follow cultures Oncology will follow up.  Follow the recommendations regarding discharge plan  Acute anemia Unclear source No obvious bleeding No oxygen requirement Suspect chemotherapy related effect versus hemodilution Plan: Case was discussed with oncology.  Considering immunocompromise status and overall hemodynamic stability will defer transfusion at this time   DVT prophylaxis: SCD Code Status: Full Family Communication: None today.  Plan discussed with patient.  Offered to call family but patient declined Disposition Plan: Status is: Inpatient  Remains inpatient appropriate because:Inpatient level of care appropriate due to severity of illness   Dispo: The patient is from: Home              Anticipated d/c is to: Home              Anticipated d/c date is: 1 day              Patient currently is not medically stable to d/c.   Difficult to place patient No  Pending oncology follow-up for disposition planning.  Anticipate discharge home within 24 hours       Consultants:   Oncology  Procedures:   None  Antimicrobials:  Vancomycin Cefepime   Subjective: Seen and examined.  No pain complaints.  Afebrile over interval.  Objective: Vitals:   10/19/20 2300 10/20/20 0614 10/20/20 0739 10/20/20 0836  BP: 99/69 107/68 110/65 111/75  Pulse: 82 78 88 87  Resp: 14 16 15 16   Temp:   98.2 F (36.8 C) 98.2 F (36.8 C)  TempSrc:   Oral   SpO2: 99% 100% 98% 99%  Weight:  Height:       No intake or output data in the 24 hours ending 10/20/20 1030 Filed Weights   10/18/20 2150  Weight: 46.3 kg    Examination:  General exam: No acute distress.  Appears 10 Respiratory system: Clear to auscultation. Respiratory effort normal. Cardiovascular system: S1 & S2 heard, RRR. No JVD, murmurs, rubs, gallops or clicks. No pedal  edema. Gastrointestinal system: Abdomen is nondistended, soft and nontender. No organomegaly or masses felt. Normal bowel sounds heard. Central nervous system: Alert and oriented. No focal neurological deficits. Extremities: Symmetric 5 x 5 power. Skin: No rashes, lesions or ulcers Psychiatry: Judgement and insight appear normal. Mood & affect appropriate.     Data Reviewed: I have personally reviewed following labs and imaging studies  CBC: Recent Labs  Lab 10/18/20 2156 10/19/20 0458 10/20/20 0358  WBC 0.3* 0.6* 0.8*  NEUTROABS 0.1*  --  0.2*  HGB 9.0* 7.5* 6.6*  HCT 25.5* 21.6* 20.2*  MCV 87.3 89.6 94.8  PLT 237 184 431*   Basic Metabolic Panel: Recent Labs  Lab 10/18/20 2156 10/19/20 0458 10/20/20 0440  NA 133* 139 138  K 4.4 3.6 3.6  CL 99 103 106  CO2 24 24 24   GLUCOSE 118* 95 96  BUN 21* 16 7  CREATININE 0.65 0.56 0.63  CALCIUM 9.4 8.6* 8.4*   GFR: Estimated Creatinine Clearance: 66.3 mL/min (by C-G formula based on SCr of 0.63 mg/dL). Liver Function Tests: Recent Labs  Lab 10/18/20 2156  AST 16  ALT 10  ALKPHOS 105  BILITOT 0.5  PROT 7.4  ALBUMIN 4.1   No results for input(s): LIPASE, AMYLASE in the last 168 hours. No results for input(s): AMMONIA in the last 168 hours. Coagulation Profile: No results for input(s): INR, PROTIME in the last 168 hours. Cardiac Enzymes: No results for input(s): CKTOTAL, CKMB, CKMBINDEX, TROPONINI in the last 168 hours. BNP (last 3 results) No results for input(s): PROBNP in the last 8760 hours. HbA1C: No results for input(s): HGBA1C in the last 72 hours. CBG: No results for input(s): GLUCAP in the last 168 hours. Lipid Profile: No results for input(s): CHOL, HDL, LDLCALC, TRIG, CHOLHDL, LDLDIRECT in the last 72 hours. Thyroid Function Tests: No results for input(s): TSH, T4TOTAL, FREET4, T3FREE, THYROIDAB in the last 72 hours. Anemia Panel: No results for input(s): VITAMINB12, FOLATE, FERRITIN, TIBC, IRON,  RETICCTPCT in the last 72 hours. Sepsis Labs: Recent Labs  Lab 10/18/20 2156  LATICACIDVEN 1.4    Recent Results (from the past 240 hour(s))  Urine Culture     Status: Abnormal   Collection Time: 10/18/20  9:56 PM   Specimen: Urine, Random  Result Value Ref Range Status   Specimen Description   Final    URINE, RANDOM Performed at [facility], [address] [place], [place] [address]    Special Requests   Final    NONE Performed at [facility], [address], [place], [place] [address]    Culture (A)  Final    <10,000 COLONIES/mL INSIGNIFICANT GROWTH Performed at [facility] [address], [place],  [address]    Report Status 10/20/2020 FINAL  Final  SARS Coronavirus 2 by RT PCR (hospital order, performed in [facility] hospital lab) Nasopharyngeal Nasopharyngeal Swab     Status: None   Collection Time: 10/19/20 12:09 AM   Specimen: Nasopharyngeal Swab  Result Value Ref Range Status   SARS Coronavirus 2 NEGATIVE NEGATIVE Final  Comment: (NOTE) SARS-CoV-2 target nucleic acids are NOT DETECTED.  The SARS-CoV-2 RNA is generally detectable in upper and lower respiratory specimens during the acute phase of infection. The lowest concentration of SARS-CoV-2 viral copies this assay can detect is 250 copies / mL. A negative result does not preclude SARS-CoV-2 infection and should not be used as the sole basis for treatment or other patient management decisions.  A negative result may occur with improper specimen collection / handling, submission of specimen other than nasopharyngeal swab, presence of viral mutation(s) within the areas targeted by this assay, and inadequate number of viral copies (<250 copies / mL). A negative result must be combined with clinical observations, patient history, and epidemiological information.  Fact Sheet for Patients:   StrictlyIdeas.no  Fact Sheet for Healthcare  Providers: BankingDealers.co.za  This test is not yet approved or  cleared by the [place] FDA and has been authorized for detection and/or diagnosis of SARS-CoV-2 by FDA under an Emergency Use Authorization (EUA).  This EUA will remain in effect (meaning this test can be used) for the duration of the COVID-19 declaration under Section 564(b)(1) of the Act, 21 U.S.C. section 360bbb-3(b)(1), unless the authorization is terminated or revoked sooner.  Performed at [facility], [address] [place], [place] [address]   Culture, blood (x 2)     Status: None (Preliminary result)   Collection Time: 10/19/20 12:09 AM   Specimen: BLOOD  Result Value Ref Range Status   Specimen Description BLOOD BLOOD LEFT FOREARM  Final   Special Requests   Final    BOTTLES DRAWN AEROBIC AND ANAEROBIC Blood Culture adequate volume   Culture   Final    NO GROWTH 1 DAY Performed at [facility], [address] [place], [place] [address]    Report Status PENDING  Incomplete  Culture, blood (x 2)     Status: None (Preliminary result)   Collection Time: 10/19/20 12:09 AM   Specimen: BLOOD  Result Value Ref Range Status   Specimen Description BLOOD HAND  Final   Special Requests   Final    BOTTLES DRAWN AEROBIC AND ANAEROBIC Blood Culture adequate volume   Culture   Final    NO GROWTH 1 DAY Performed at [facility], [address], [place], [place] [address]    Report Status PENDING  Incomplete         Radiology Studies: DG Chest 2 View  Result Date: 10/18/2020 CLINICAL DATA:  Breast cancer patient with fever today. Chills and weakness. Recent mastectomy. EXAM: CHEST - 2 VIEW COMPARISON:  08/05/2020 FINDINGS: Hyperinflation of the lungs. Power port type left central venous catheter with tip over the low SVC region. No pneumothorax. Heart size and pulmonary vascularity are normal. Postoperative right mastectomy with breast reconstruction.  Lungs are clear. No airspace disease, consolidation, or edema. No pleural effusions. No pneumothorax. Mediastinal contours appear intact. IMPRESSION: No active cardiopulmonary disease. Electronically Signed   By: Lucienne Capers M.D.   On: 10/18/2020 23:25        Scheduled Meds: . acetaminophen  1,000 mg Oral Once  . acidophilus  1 capsule Oral Daily  . amphetamine-dextroamphetamine  30 mg Oral Daily  . DULoxetine  20 mg Oral QHS  . enoxaparin (LOVENOX) injection  40 mg Subcutaneous Q24H  . pregabalin  100 mg Oral Daily   Continuous Infusions: . sodium chloride 100 mL/hr at 10/19/20 1415  . ceFEPime (MAXIPIME) IV Stopped (10/19/20 2352)  . vancomycin Stopped (10/20/20  0246)     LOS: 1 day    Time spent: 25 minutes    Sidney Ace, MD [facility] Pager [phone]  If 7PM-7AM, please contact night-coverage 10/20/2020, 10:30 AM

## 2020-10-20 NOTE — ED Notes (Signed)
Recollected light green lab tube sent to lab.

## 2020-10-20 NOTE — Progress Notes (Signed)
Beth Wells   DOB:October 22, 1975   BM#:[id number]    Subjective: Patient sitting in the bed.  No acute distress.  Denies any worsening shortness of breath or cough or fevers or chills.  No diarrhea.  Abdominal pain.  Objective:  Vitals:   10/20/20 1609 10/20/20 2007  BP: (!) 96/54 103/66  Pulse: 88 82  Resp: 16 16  Temp: 98.7 F (37.1 C) 99.5 F (37.5 C)  SpO2: 100% 98%     Intake/Output Summary (Last 24 hours) at 10/20/2020 2059 Last data filed at 10/20/2020 1622 Gross per 24 hour  Intake 3588.58 ml  Output --  Net 3588.58 ml    Physical Exam Constitutional:      Comments: Pt in no acute distress.  Pale.  HENT:     Head: Normocephalic and atraumatic.     Mouth/Throat:     Mouth: Oropharynx is clear and moist.     Pharynx: No oropharyngeal exudate.  Eyes:     Pupils: Pupils are equal, round, and reactive to light.  Cardiovascular:     Rate and Rhythm: Normal rate and regular rhythm.  Pulmonary:     Effort: Pulmonary effort is normal. No respiratory distress.     Breath sounds: Normal breath sounds. No wheezing.  Abdominal:     General: Bowel sounds are normal. There is no distension.     Palpations: Abdomen is soft. There is no mass.     Tenderness: There is no abdominal tenderness. There is no guarding or rebound.  Musculoskeletal:        General: No tenderness or edema. Normal range of motion.     Cervical back: Normal range of motion and neck supple.  Skin:    General: Skin is warm.  Neurological:     Mental Status: She is alert and oriented to person, place, and time.  Psychiatric:        Mood and Affect: Affect normal.      Labs:  Lab Results  Component Value Date   WBC 0.8 (LL) 10/20/2020   HGB 6.6 (L) 10/20/2020   HCT 20.2 (L) 10/20/2020   MCV 94.8 10/20/2020   PLT 141 (L) 10/20/2020   NEUTROABS 0.2 (LL) 10/20/2020    Lab Results  Component Value Date   NA 138 10/20/2020   K 3.6 10/20/2020   CL 106 10/20/2020   CO2 24 10/20/2020    Studies:   DG Chest 2 View  Result Date: 10/18/2020 CLINICAL DATA:  Breast cancer patient with fever today. Chills and weakness. Recent mastectomy. EXAM: CHEST - 2 VIEW COMPARISON:  08/05/2020 FINDINGS: Hyperinflation of the lungs. Power port type left central venous catheter with tip over the low SVC region. No pneumothorax. Heart size and pulmonary vascularity are normal. Postoperative right mastectomy with breast reconstruction. Lungs are clear. No airspace disease, consolidation, or edema. No pleural effusions. No pneumothorax. Mediastinal contours appear intact. IMPRESSION: No active cardiopulmonary disease. Electronically Signed   By: Lucienne Capers M.D.   On: 10/18/2020 23:25    Neutropenic fever [facility] [place]) #45 year old female patient with stage I triple negative breast cancer currently admitted to hospital for fever/severe neutropenia  #Neutropenic fever-s/p chemo day #9 today. question source.  Patient status post growth factor post chemotherapy.  Currently on vancomycin plus cefepime.  Currently afebrile.  If patient continues to be afebrile; clinically stable for 1 more day patient will be discharged on p.o. Levaquin to complete a total of 10 days therapy.  #Severe anemia hemoglobin 6.6-from chemotherapy.  Patient  clinically stable.  Discussed pros and cons of PRBC transfusion.  At this time I think is reasonable to hold off PRBC transfusion.  #Right breast cancer stage I-on adjuvant chemotherapy Adriamycin Cytoxan x3.  Discussed with patient that we will review chemotherapy plan at the next office visit.   Discussed with Dr. Priscella Mann.    Cammie Sickle, MD 10/20/2020  8:59 PM

## 2020-10-21 LAB — CBC WITH DIFFERENTIAL/PLATELET
Abs Immature Granulocytes: 0.01 10*3/uL (ref 0.00–0.07)
Abs Immature Granulocytes: 0.52 10*3/uL — ABNORMAL HIGH (ref 0.00–0.07)
Basophils Absolute: 0 10*3/uL (ref 0.0–0.1)
Basophils Absolute: 0.1 10*3/uL (ref 0.0–0.1)
Basophils Relative: 3 %
Basophils Relative: 2 %
Eosinophils Absolute: 0 10*3/uL (ref 0.0–0.5)
Eosinophils Absolute: 0 10*3/uL (ref 0.0–0.5)
Eosinophils Relative: 3 %
Eosinophils Relative: 1 %
HCT: 20.2 % — ABNORMAL LOW (ref 36.0–46.0)
HCT: 21.6 % — ABNORMAL LOW (ref 36.0–46.0)
Hemoglobin: 6.6 g/dL — ABNORMAL LOW (ref 12.0–15.0)
Hemoglobin: 7.4 g/dL — ABNORMAL LOW (ref 12.0–15.0)
Immature Granulocytes: 1 %
Immature Granulocytes: 12 %
Lymphocytes Relative: 43 %
Lymphocytes Relative: 14 %
Lymphs Abs: 0.3 10*3/uL — ABNORMAL LOW (ref 0.7–4.0)
Lymphs Abs: 0.6 10*3/uL — ABNORMAL LOW (ref 0.7–4.0)
MCH: 31 pg (ref 26.0–34.0)
MCH: 30.8 pg (ref 26.0–34.0)
MCHC: 32.7 g/dL (ref 30.0–36.0)
MCHC: 34.3 g/dL (ref 30.0–36.0)
MCV: 94.8 fL (ref 80.0–100.0)
MCV: 90 fL (ref 80.0–100.0)
Monocytes Absolute: 0.2 10*3/uL (ref 0.1–1.0)
Monocytes Absolute: 0.7 10*3/uL (ref 0.1–1.0)
Monocytes Relative: 21 %
Monocytes Relative: 16 %
Neutro Abs: 0.2 10*3/uL — CL (ref 1.7–7.7)
Neutro Abs: 2.3 10*3/uL (ref 1.7–7.7)
Neutrophils Relative %: 29 %
Neutrophils Relative %: 55 %
Platelets: 141 10*3/uL — ABNORMAL LOW (ref 150–400)
Platelets: 180 10*3/uL (ref 150–400)
RBC: 2.13 MIL/uL — ABNORMAL LOW (ref 3.87–5.11)
RBC: 2.4 MIL/uL — ABNORMAL LOW (ref 3.87–5.11)
RDW: 12.3 % (ref 11.5–15.5)
RDW: 12.1 % (ref 11.5–15.5)
Smear Review: NORMAL
Smear Review: NORMAL
WBC: 0.8 10*3/uL — CL (ref 4.0–10.5)
WBC: 4.2 10*3/uL (ref 4.0–10.5)
nRBC: 0 % (ref 0.0–0.2)
nRBC: 0.5 % — ABNORMAL HIGH (ref 0.0–0.2)

## 2020-10-21 LAB — BASIC METABOLIC PANEL
Anion gap: 8 (ref 5–15)
BUN: 5 mg/dL — ABNORMAL LOW (ref 6–20)
CO2: 25 mmol/L (ref 22–32)
Calcium: 8.5 mg/dL — ABNORMAL LOW (ref 8.9–10.3)
Chloride: 105 mmol/L (ref 98–111)
Creatinine, Ser: 0.61 mg/dL (ref 0.44–1.00)
GFR, Estimated: >60 mL/min (ref >60)
Glucose, Bld: 89 mg/dL (ref 70–99)
Potassium: 4 mmol/L (ref 3.5–5.1)
Sodium: 138 mmol/L (ref 135–145)

## 2020-10-21 MED ORDER — LEVOFLOXACIN 750 MG PO TABS: 750.0000 mg | ORAL_TABLET | Freq: Every day | ORAL | 0 refills | Status: AC

## 2020-10-21 NOTE — Progress Notes (Signed)
Patient is a 45 year old female here for follow-up after undergoing right breast reconstruction with Dr. Marla Roe on 08/05/2020.  Patient started chemotherapy in December..  ~ 11 weeks PO Patient presents today with her husband.  She reports chemo has been tough.  She just got out of the hospital due to neutropenic fever yesterday.  She has 1 more AC chemo treatment and then will do weekly treatments of taxol for 12 weeks.  She is pleased with the size of her right breast and is fairly symmetric.  Incision is healing nicely, C/D/I.  No signs of infection, redness, drainage.  No signs of seroma.  At this time she would like to move towards exchange to implant.    If at all possible she would like to have the surgery before July 1 as that was when her insurance year starts again and she has met her deductible for the current year. She understands timing of this will have to be worked out with her chemo.  She would like the implant to be more medial if possible. Follow up in 4-6 weeks. Call office with any questions/concerns.   We placed injectable saline in the Expander using a sterile technique: Right: 0 cc for a total of 240 / 350 cc

## 2020-10-21 NOTE — Discharge Summary (Signed)
Physician Discharge Summary  Beth Wells [id number] DOB: December 08, 1975 DOA: 10/18/2020  PCP: [name]  Admit date: 10/18/2020 Discharge date: 10/21/2020  Admitted From: Home Disposition:  Home  Recommendations for Outpatient Follow-up:  1. Follow up with PCP in 1-2 weeks 2. Follow up with oncology as directed  Home Health:No Equipment/Devices:None Discharge Condition:Stable CODE STATUS:Full Diet recommendation: Regular   Brief/Interim Summary: 45 year old female with history significant for triple negative breast CA currently on chemotherapy. Per last oncology note she is status post cycle 3 AC. Patient called her oncologist with complaints of fever up to 102 at home associated with chills.  Patient was directed to the emergency room where she was pancultured and started on broad-spectrum antibiotics both vancomycin as well as cefepime. On my evaluation she is resting comfortably in bed. She is in no visible distress. No reported fever since admission.  I have communicated with patient's primary oncologist Dr. Rogue Bussing who agreed to consult on the patient.  Recommendations are appreciated  Patient remained fever free and hemodynamically stable throughout the course of admission.  On 10/20/2020 she had a drop in hemoglobin to 6.6.  Given her immunocompromise status and lack of overt bleeding and hemodynamic stability will elect to defer transfusion at this time.  Patient's white count improving.  Afebrile.  All cultures no growth to date.  Hemoglobin improved to 7.4.  No indication for transfusion.  Okay for discharge home at this time.  Discharge Diagnoses:  Principal Problem:   Neutropenic fever ([place]) Active Problems:   Carcinoma of upper-outer quadrant of right breast in female, estrogen receptor negative ([place])   Leukopenia due to antineoplastic chemotherapy [facility])  45 year old female with history oftriple negative right breast cancer on adjuvant  chemotherapy, followed by Dr. Rogue Bussing, and recently treated for mucositis presenting with sudden onset of chills with temperature of 102 at home.  Neutropenic fever ([facility]) Leukopenia due to antineoplastic chemotherapy ([place]) Carcinoma of upper-outer quadrant of right breast in female, estrogen receptor negative ([place]) -Patient presenting with fever of 102 at home with history of recent mucositis  -Started on broad-spectrum antibiotics on admission -Remained hemodynamically stable and afebrile -Blood cultures no growth to date -Discussed with oncology -All cultures remain negative throughout course of admission  -IV vancomycin and cefepime stopped on time of discharge-  -Transition to p.o. Levaquin to complete total 10-day antibiotic course -Follow-up with oncology as directed  Acute anemia Unclear source No obvious bleeding No oxygen requirement Suspect chemotherapy related effect versus hemodilution Hemoglobin dropped low 6.6, spontaneously recovered to 7.4 No indication for transfusion  Discharge Instructions  Discharge Instructions    Diet - low sodium heart healthy   Complete by: As directed    Increase activity slowly   Complete by: As directed      Allergies as of 10/21/2020   No Known Allergies     Medication List    TAKE these medications   acetaminophen 500 MG tablet Commonly known as: TYLENOL Take 1 tablet (500 mg total) by mouth every 6 (six) hours as needed. For use AFTER surgery   ALPHA-LIPOIC ACID PO Take 500 mg by mouth daily.   amphetamine-dextroamphetamine 30 MG 24 hr capsule Commonly known as: ADDERALL XR Take 30 mg by mouth daily.   DULoxetine 20 MG capsule Commonly known as: CYMBALTA Take 2 capsules (40 mg total) by mouth at bedtime. What changed: how much to take   levofloxacin 750 MG tablet Commonly known as: Levaquin Take 1 tablet (750 mg total) by mouth  daily for 7 days.   lidocaine-prilocaine cream Commonly known as:  EMLA Apply 1 application topically as needed. On the port generously; 30-45 mins prior to chemo appt.   lisinopril 5 MG tablet Commonly known as: ZESTRIL Take 5 mg by mouth every evening.   magic mouthwash w/lidocaine Soln Take 5 mLs by mouth. 80 ml viscous lidocaine 2%, 80 ml Mylanta, 80 ml Diphenhydramine 12.5 mg/5 ml Elixir, 80 ml Nystatin 100,000 Unit suspension, 80 ml Prednisolone 15 mg/45ml, 80 ml Distilled Water. Sig: Swish/Swallow 5-10 ml four times a day as needed. Dispense 480 ml. 3RFs   ondansetron 8 MG tablet Commonly known as: ZOFRAN One pill every 8 hours as needed for nausea/vomitting.   pregabalin 100 MG capsule Commonly known as: Lyrica Take 1 capsule (100 mg total) by mouth 2 (two) times daily.   PROBIOTIC ADVANCED PO Take 1 capsule by mouth daily.   prochlorperazine 10 MG tablet Commonly known as: COMPAZINE Take 1 tablet (10 mg total) by mouth every 6 (six) hours as needed for nausea or vomiting.   sodium fluoride 1.1 % Gel dental gel Commonly known as: FLUORISHIELD Place 1 application onto teeth 2 (two) times daily.   trolamine salicylate 10 % cream Commonly known as: ASPERCREME Apply 1 application topically as needed for muscle pain.       No Known Allergies  Consultations:  Oncology- Dr. Rogue Bussing   Procedures/Studies: DG Chest 2 View  Result Date: 10/18/2020 CLINICAL DATA:  Breast cancer patient with fever today. Chills and weakness. Recent mastectomy. EXAM: CHEST - 2 VIEW COMPARISON:  08/05/2020 FINDINGS: Hyperinflation of the lungs. Power port type left central venous catheter with tip over the low SVC region. No pneumothorax. Heart size and pulmonary vascularity are normal. Postoperative right mastectomy with breast reconstruction. Lungs are clear. No airspace disease, consolidation, or edema. No pleural effusions. No pneumothorax. Mediastinal contours appear intact. IMPRESSION: No active cardiopulmonary disease. Electronically Signed   By:  Lucienne Capers M.D.   On: 10/18/2020 23:25    (Echo, Carotid, EGD, Colonoscopy, ERCP)    Subjective: Patient seen and examined the day of discharge.  Stable, no distress.  Stable for discharge home  Discharge Exam: Vitals:   10/21/20 0840 10/21/20 1200  BP: 104/71 108/76  Pulse: 79 95  Resp: 15 16  Temp: 98.1 F (36.7 C) 98.2 F (36.8 C)  SpO2: 99% 100%   Vitals:   10/21/20 0130 10/21/20 0534 10/21/20 0840 10/21/20 1200  BP: 99/66 (!) 103/59 104/71 108/76  Pulse: 85 82 79 95  Resp: 16 16 15 16   Temp: 98.2 F (36.8 C) 97.8 F (36.6 C) 98.1 F (36.7 C) 98.2 F (36.8 C)  TempSrc:   Oral Oral  SpO2: 100% 100% 99% 100%  Weight:      Height:        General: Pt is alert, awake, not in acute distress Cardiovascular: RRR, S1/S2 +, no rubs, no gallops Respiratory: CTA bilaterally, no wheezing, no rhonchi Abdominal: Soft, NT, ND, bowel sounds + Extremities: no edema, no cyanosis    The results of significant diagnostics from this hospitalization (including imaging, microbiology, ancillary and laboratory) are listed below for reference.     Microbiology: Recent Results (from the past 240 hour(s))  Urine Culture     Status: Abnormal   Collection Time: 10/18/20  9:56 PM   Specimen: Urine, Random  Result Value Ref Range Status   Specimen Description   Final    URINE, RANDOM Performed at [facility],  [place], [place] [address]    Special Requests   Final    NONE Performed at [facility], [address] [place], [place] [address]    Culture (A)  Final    <10,000 COLONIES/mL INSIGNIFICANT GROWTH Performed at [facility] [address], [place], [place] [address]    Report Status 10/20/2020 FINAL  Final  SARS Coronavirus 2 by RT PCR (hospital order, performed in [facility] hospital lab) Nasopharyngeal Nasopharyngeal Swab     Status: None   Collection Time: 10/19/20 12:09 AM   Specimen: Nasopharyngeal Swab  Result  Value Ref Range Status   SARS Coronavirus 2 NEGATIVE NEGATIVE Final    Comment: (NOTE) SARS-CoV-2 target nucleic acids are NOT DETECTED.  The SARS-CoV-2 RNA is generally detectable in upper and lower respiratory specimens during the acute phase of infection. The lowest concentration of SARS-CoV-2 viral copies this assay can detect is 250 copies / mL. A negative result does not preclude SARS-CoV-2 infection and should not be used as the sole basis for treatment or other patient management decisions.  A negative result may occur with improper specimen collection / handling, submission of specimen other than nasopharyngeal swab, presence of viral mutation(s) within the areas targeted by this assay, and inadequate number of viral copies (<250 copies / mL). A negative result must be combined with clinical observations, patient history, and epidemiological information.  Fact Sheet for Patients:   StrictlyIdeas.no  Fact Sheet for Healthcare Providers: BankingDealers.co.za  This test is not yet approved or  cleared by the [place] FDA and has been authorized for detection and/or diagnosis of SARS-CoV-2 by FDA under an Emergency Use Authorization (EUA).  This EUA will remain in effect (meaning this test can be used) for the duration of the COVID-19 declaration under Section 564(b)(1) of the Act, 21 U.S.C. section 360bbb-3(b)(1), unless the authorization is terminated or revoked sooner.  Performed at [facility], [address] [place], [place] [address]   Culture, blood (x 2)     Status: None (Preliminary result)   Collection Time: 10/19/20 12:09 AM   Specimen: BLOOD  Result Value Ref Range Status   Specimen Description BLOOD BLOOD LEFT FOREARM  Final   Special Requests   Final    BOTTLES DRAWN AEROBIC AND ANAEROBIC Blood Culture adequate volume   Culture   Final    NO GROWTH 2 DAYS Performed at [facility],  [address], [place], [place] [address]    Report Status PENDING  Incomplete  Culture, blood (x 2)     Status: None (Preliminary result)   Collection Time: 10/19/20 12:09 AM   Specimen: BLOOD  Result Value Ref Range Status   Specimen Description BLOOD HAND  Final   Special Requests   Final    BOTTLES DRAWN AEROBIC AND ANAEROBIC Blood Culture adequate volume   Culture   Final    NO GROWTH 2 DAYS Performed at [facility], [address], [place], [place] [address]    Report Status PENDING  Incomplete     Labs: BNP (last 3 results) No results for input(s): BNP in the last 8760 hours. Basic Metabolic Panel: Recent Labs  Lab 10/18/20 2156 10/19/20 0458 10/20/20 0440 10/21/20 0634  NA 133* 139 138 138  K 4.4 3.6 3.6 4.0  CL 99 103 106 105  CO2 24 24 24 25   GLUCOSE 118* 95 96 89  BUN 21* 16 7 5*  CREATININE 0.65 0.56  0.63 0.61  CALCIUM 9.4 8.6* 8.4* 8.5*   Liver Function Tests: Recent Labs  Lab 10/18/20 2156  AST 16  ALT 10  ALKPHOS 105  BILITOT 0.5  PROT 7.4  ALBUMIN 4.1   No results for input(s): LIPASE, AMYLASE in the last 168 hours. No results for input(s): AMMONIA in the last 168 hours. CBC: Recent Labs  Lab 10/18/20 2156 10/19/20 0458 10/20/20 0358 10/21/20 0634  WBC 0.3* 0.6* 0.8* 4.2  NEUTROABS 0.1*  --  0.2* 2.3  HGB 9.0* 7.5* 6.6* 7.4*  HCT 25.5* 21.6* 20.2* 21.6*  MCV 87.3 89.6 94.8 90.0  PLT 237 184 141* 180   Cardiac Enzymes: No results for input(s): CKTOTAL, CKMB, CKMBINDEX, TROPONINI in the last 168 hours. BNP: Invalid input(s): POCBNP CBG: No results for input(s): GLUCAP in the last 168 hours. D-Dimer No results for input(s): DDIMER in the last 72 hours. Hgb A1c No results for input(s): HGBA1C in the last 72 hours. Lipid Profile No results for input(s): CHOL, HDL, LDLCALC, TRIG, CHOLHDL, LDLDIRECT in the last 72 hours. Thyroid function studies No results for input(s): TSH, T4TOTAL, T3FREE, THYROIDAB in the last 72  hours.  Invalid input(s): FREET3 Anemia work up No results for input(s): VITAMINB12, FOLATE, FERRITIN, TIBC, IRON, RETICCTPCT in the last 72 hours. Urinalysis    Component Value Date/Time   COLORURINE YELLOW (A) 10/18/2020 2156   APPEARANCEUR CLEAR (A) 10/18/2020 2156   LABSPEC 1.021 10/18/2020 2156   PHURINE 7.0 10/18/2020 2156   GLUCOSEU NEGATIVE 10/18/2020 2156   HGBUR NEGATIVE 10/18/2020 2156   BILIRUBINUR NEGATIVE 10/18/2020 2156   KETONESUR NEGATIVE 10/18/2020 2156   PROTEINUR NEGATIVE 10/18/2020 2156   NITRITE NEGATIVE 10/18/2020 2156   LEUKOCYTESUR NEGATIVE 10/18/2020 2156   Sepsis Labs Invalid input(s): PROCALCITONIN,  WBC,  LACTICIDVEN Microbiology Recent Results (from the past 240 hour(s))  Urine Culture     Status: Abnormal   Collection Time: 10/18/20  9:56 PM   Specimen: Urine, Random  Result Value Ref Range Status   Specimen Description   Final    URINE, RANDOM Performed at [facility], [address] [place], [place] [address]    Special Requests   Final    NONE Performed at [facility], [address], [place], [place] [address]    Culture (A)  Final    <10,000 COLONIES/mL INSIGNIFICANT GROWTH Performed at [facility], [place] [address], [place], [place] [address]    Report Status 10/20/2020 FINAL  Final  SARS Coronavirus 2 by RT PCR (hospital order, performed in [facility] hospital lab) Nasopharyngeal Nasopharyngeal Swab     Status: None   Collection Time: 10/19/20 12:09 AM   Specimen: Nasopharyngeal Swab  Result Value Ref Range Status   SARS Coronavirus 2 NEGATIVE NEGATIVE Final    Comment: (NOTE) SARS-CoV-2 target nucleic acids are NOT DETECTED.  The SARS-CoV-2 RNA is generally detectable in upper and lower respiratory specimens during the acute phase of infection. The lowest concentration of SARS-CoV-2 viral copies this assay can detect is 250 copies / mL. A negative result does not preclude SARS-CoV-2 infection and  should not be used as the sole basis for treatment or other patient management decisions.  A negative result may occur with improper specimen collection / handling, submission of specimen other than nasopharyngeal swab, presence of viral mutation(s) within the areas targeted by this assay, and inadequate number of viral copies (<250 copies / mL). A negative result must be combined with clinical observations, patient  history, and epidemiological information.  Fact Sheet for Patients:   StrictlyIdeas.no  Fact Sheet for Healthcare Providers: BankingDealers.co.za  This test is not yet approved or  cleared by the [place] FDA and has been authorized for detection and/or diagnosis of SARS-CoV-2 by FDA under an Emergency Use Authorization (EUA).  This EUA will remain in effect (meaning this test can be used) for the duration of the COVID-19 declaration under Section 564(b)(1) of the Act, 21 U.S.C. section 360bbb-3(b)(1), unless the authorization is terminated or revoked sooner.  Performed at [facility], [address] [place], [place] [address]   Culture, blood (x 2)     Status: None (Preliminary result)   Collection Time: 10/19/20 12:09 AM   Specimen: BLOOD  Result Value Ref Range Status   Specimen Description BLOOD BLOOD LEFT FOREARM  Final   Special Requests   Final    BOTTLES DRAWN AEROBIC AND ANAEROBIC Blood Culture adequate volume   Culture   Final    NO GROWTH 2 DAYS Performed at [facility], [address] [place], [place] [address]    Report Status PENDING  Incomplete  Culture, blood (x 2)     Status: None (Preliminary result)   Collection Time: 10/19/20 12:09 AM   Specimen: BLOOD  Result Value Ref Range Status   Specimen Description BLOOD HAND  Final   Special Requests   Final    BOTTLES DRAWN AEROBIC AND ANAEROBIC Blood Culture adequate volume   Culture   Final    NO GROWTH 2 DAYS Performed  at [facility], [address], [place], [place] [address]    Report Status PENDING  Incomplete     Time coordinating discharge: Over 30 minutes  SIGNED:   Sidney Ace, MD  [facility] 10/21/2020, 12:10 PM Pager   If 7PM-7AM, please contact night-coverage

## 2020-10-21 NOTE — Progress Notes (Signed)
Beth Wells to be D/C'd Home per MD order.  Discussed prescriptions and follow up appointments with the patient. Prescriptions given to patient, medication list explained in detail. Pt verbalized understanding.  Allergies as of 10/21/2020   No Known Allergies     Medication List    TAKE these medications   acetaminophen 500 MG tablet Commonly known as: TYLENOL Take 1 tablet (500 mg total) by mouth every 6 (six) hours as needed. For use AFTER surgery   ALPHA-LIPOIC ACID PO Take 500 mg by mouth daily.   amphetamine-dextroamphetamine 30 MG 24 hr capsule Commonly known as: ADDERALL XR Take 30 mg by mouth daily.   DULoxetine 20 MG capsule Commonly known as: CYMBALTA Take 2 capsules (40 mg total) by mouth at bedtime. What changed: how much to take   levofloxacin 750 MG tablet Commonly known as: Levaquin Take 1 tablet (750 mg total) by mouth daily for 7 days.   lidocaine-prilocaine cream Commonly known as: EMLA Apply 1 application topically as needed. On the port generously; 30-45 mins prior to chemo appt.   lisinopril 5 MG tablet Commonly known as: ZESTRIL Take 5 mg by mouth every evening.   magic mouthwash w/lidocaine Soln Take 5 mLs by mouth. 80 ml viscous lidocaine 2%, 80 ml Mylanta, 80 ml Diphenhydramine 12.5 mg/5 ml Elixir, 80 ml Nystatin 100,000 Unit suspension, 80 ml Prednisolone 15 mg/3ml, 80 ml Distilled Water. Sig: Swish/Swallow 5-10 ml four times a day as needed. Dispense 480 ml. 3RFs   ondansetron 8 MG tablet Commonly known as: ZOFRAN One pill every 8 hours as needed for nausea/vomitting.   pregabalin 100 MG capsule Commonly known as: Lyrica Take 1 capsule (100 mg total) by mouth 2 (two) times daily.   PROBIOTIC ADVANCED PO Take 1 capsule by mouth daily.   prochlorperazine 10 MG tablet Commonly known as: COMPAZINE Take 1 tablet (10 mg total) by mouth every 6 (six) hours as needed for nausea or vomiting.   sodium fluoride 1.1 % Gel dental gel Commonly  known as: FLUORISHIELD Place 1 application onto teeth 2 (two) times daily.   trolamine salicylate 10 % cream Commonly known as: ASPERCREME Apply 1 application topically as needed for muscle pain.       Vitals:   10/21/20 1200 10/21/20 1549  BP: 108/76 109/71  Pulse: 95 84  Resp: 16 16  Temp: 98.2 F (36.8 C) 98.9 F (37.2 C)  SpO2: 100% 100%    Skin clean, dry and intact without evidence of skin break down, no evidence of skin tears noted. IV catheter discontinued intact. Site without signs and symptoms of complications. Dressing and pressure applied. Pt denies pain at this time. No complaints noted.  An After Visit Summary was printed and given to the patient. Patient escorted via [place], and D/C home via private auto.  Beth Wells

## 2020-10-21 NOTE — Progress Notes (Signed)
Beth Wells   DOB:19-Jul-1976   WF#:[id number]    Subjective: Patient denies any known fevers.  No chills.  Nausea vomiting.  No headaches.  Objective:  Vitals:   10/21/20 1200 10/21/20 1549  BP: 108/76 109/71  Pulse: 95 84  Resp: 16 16  Temp: 98.2 F (36.8 C) 98.9 F (37.2 C)  SpO2: 100% 100%     Intake/Output Summary (Last 24 hours) at 10/22/2020 2202 Last data filed at 10/21/2020 1500 Gross per 24 hour  Intake 100 ml  Output -  Net 100 ml    Physical Exam HENT:     Head: Normocephalic and atraumatic.     Mouth/Throat:     Mouth: Oropharynx is clear and moist.     Pharynx: No oropharyngeal exudate.  Eyes:     Pupils: Pupils are equal, round, and reactive to light.  Cardiovascular:     Rate and Rhythm: Normal rate and regular rhythm.  Pulmonary:     Effort: Pulmonary effort is normal. No respiratory distress.     Breath sounds: Normal breath sounds. No wheezing.  Abdominal:     General: Bowel sounds are normal. There is no distension.     Palpations: Abdomen is soft. There is no mass.     Tenderness: There is no abdominal tenderness. There is no guarding or rebound.  Musculoskeletal:        General: No tenderness or edema. Normal range of motion.     Cervical back: Normal range of motion and neck supple.  Skin:    General: Skin is warm.  Neurological:     Mental Status: She is alert and oriented to person, place, and time.  Psychiatric:        Mood and Affect: Affect normal.      Labs:  Lab Results  Component Value Date   WBC 4.2 10/21/2020   HGB 7.4 (L) 10/21/2020   HCT 21.6 (L) 10/21/2020   MCV 90.0 10/21/2020   PLT 180 10/21/2020   NEUTROABS 2.3 10/21/2020    Lab Results  Component Value Date   NA 138 10/21/2020   K 4.0 10/21/2020   CL 105 10/21/2020   CO2 25 10/21/2020    Studies:  No results found.  Neutropenic fever [facility]) #45 year old female patient with stage I triple negative breast cancer currently admitted to hospital for fever/severe  neutropenia  #Neutropenic fever-s/p chemo day #10 today. question source.  Patient status post growth factor post chemotherapy.  Currently on vancomycin plus cefepime.  Currently afebrile.  White count today is 4.  ANC normal.  Patient okay to be discharged with Levaquin for total 7 days.  #Severe anemia hemoglobin 6.6-from chemotherapy; this morning hemoglobin 7.7.  As patient is clinically improving-hold off any PRBC transfusion.  #Right breast cancer stage I-on adjuvant chemotherapy Adriamycin Cytoxan x3; discussed with the patient that unfortunately neutropenic fever could still happen with subsequent doses of chemotherapy.  However less likely to happen with Taxol.  Will review with the patient regarding treatment plan at next visit on 1/31. Discussed with Dr. Priscella Mann.   Cammie Sickle, MD

## 2020-10-22 ENCOUNTER — Ambulatory Visit (INDEPENDENT_AMBULATORY_CARE_PROVIDER_SITE_OTHER): Payer: BC Managed Care – PPO | Admitting: Plastic Surgery

## 2020-10-22 ENCOUNTER — Other Ambulatory Visit: Payer: Self-pay

## 2020-10-22 ENCOUNTER — Encounter: Payer: Self-pay | Admitting: Plastic Surgery

## 2020-10-22 VITALS — BP 120/80 | HR 93

## 2020-10-22 DIAGNOSIS — Z9011 Acquired absence of right breast and nipple: Secondary | ICD-10-CM

## 2020-10-24 LAB — CULTURE, BLOOD (ROUTINE X 2)
Culture: NO GROWTH
Culture: NO GROWTH
Special Requests: ADEQUATE
Special Requests: ADEQUATE

## 2020-10-25 ENCOUNTER — Inpatient Hospital Stay: Payer: BC Managed Care – PPO

## 2020-10-25 ENCOUNTER — Other Ambulatory Visit: Payer: Self-pay

## 2020-10-25 ENCOUNTER — Inpatient Hospital Stay (HOSPITAL_BASED_OUTPATIENT_CLINIC_OR_DEPARTMENT_OTHER): Payer: BC Managed Care – PPO | Admitting: Internal Medicine

## 2020-10-25 VITALS — BP 127/82 | HR 87 | Resp 16

## 2020-10-25 DIAGNOSIS — Z9011 Acquired absence of right breast and nipple: Secondary | ICD-10-CM | POA: Diagnosis not present

## 2020-10-25 DIAGNOSIS — R5381 Other malaise: Secondary | ICD-10-CM | POA: Diagnosis not present

## 2020-10-25 DIAGNOSIS — C50411 Malignant neoplasm of upper-outer quadrant of right female breast: Secondary | ICD-10-CM

## 2020-10-25 DIAGNOSIS — T451X5A Adverse effect of antineoplastic and immunosuppressive drugs, initial encounter: Secondary | ICD-10-CM | POA: Diagnosis not present

## 2020-10-25 DIAGNOSIS — R0609 Other forms of dyspnea: Secondary | ICD-10-CM | POA: Diagnosis not present

## 2020-10-25 DIAGNOSIS — R5383 Other fatigue: Secondary | ICD-10-CM | POA: Diagnosis not present

## 2020-10-25 DIAGNOSIS — I10 Essential (primary) hypertension: Secondary | ICD-10-CM | POA: Diagnosis not present

## 2020-10-25 DIAGNOSIS — K5900 Constipation, unspecified: Secondary | ICD-10-CM | POA: Diagnosis not present

## 2020-10-25 DIAGNOSIS — Z5111 Encounter for antineoplastic chemotherapy: Secondary | ICD-10-CM | POA: Diagnosis not present

## 2020-10-25 DIAGNOSIS — Z171 Estrogen receptor negative status [ER-]: Secondary | ICD-10-CM

## 2020-10-25 DIAGNOSIS — K219 Gastro-esophageal reflux disease without esophagitis: Secondary | ICD-10-CM | POA: Diagnosis not present

## 2020-10-25 DIAGNOSIS — R519 Headache, unspecified: Secondary | ICD-10-CM | POA: Diagnosis not present

## 2020-10-25 DIAGNOSIS — G4700 Insomnia, unspecified: Secondary | ICD-10-CM | POA: Diagnosis not present

## 2020-10-25 DIAGNOSIS — K1232 Oral mucositis (ulcerative) due to other drugs: Secondary | ICD-10-CM | POA: Diagnosis not present

## 2020-10-25 DIAGNOSIS — Z79818 Long term (current) use of other agents affecting estrogen receptors and estrogen levels: Secondary | ICD-10-CM | POA: Diagnosis not present

## 2020-10-25 DIAGNOSIS — Z79899 Other long term (current) drug therapy: Secondary | ICD-10-CM | POA: Diagnosis not present

## 2020-10-25 DIAGNOSIS — G629 Polyneuropathy, unspecified: Secondary | ICD-10-CM | POA: Diagnosis not present

## 2020-10-25 DIAGNOSIS — Z5189 Encounter for other specified aftercare: Secondary | ICD-10-CM | POA: Diagnosis not present

## 2020-10-25 LAB — CBC WITH DIFFERENTIAL/PLATELET
Abs Immature Granulocytes: 2.6 10*3/uL — ABNORMAL HIGH (ref 0.00–0.07)
Band Neutrophils: 11 %
Basophils Absolute: 0.6 10*3/uL — ABNORMAL HIGH (ref 0.0–0.1)
Basophils Relative: 3 %
Eosinophils Absolute: 0 10*3/uL (ref 0.0–0.5)
Eosinophils Relative: 0 %
HCT: 24.2 % — ABNORMAL LOW (ref 36.0–46.0)
Hemoglobin: 8.3 g/dL — ABNORMAL LOW (ref 12.0–15.0)
Lymphocytes Relative: 8 %
Lymphs Abs: 1.6 10*3/uL (ref 0.7–4.0)
MCH: 31.2 pg (ref 26.0–34.0)
MCHC: 34.3 g/dL (ref 30.0–36.0)
MCV: 91 fL (ref 80.0–100.0)
Metamyelocytes Relative: 6 %
Monocytes Absolute: 0.6 10*3/uL (ref 0.1–1.0)
Monocytes Relative: 3 %
Myelocytes: 7 %
Neutro Abs: 14.5 10*3/uL — ABNORMAL HIGH (ref 1.7–7.7)
Neutrophils Relative %: 62 %
Platelets: 337 10*3/uL (ref 150–400)
RBC: 2.66 MIL/uL — ABNORMAL LOW (ref 3.87–5.11)
RDW: 13.6 % (ref 11.5–15.5)
Smear Review: NORMAL
WBC: 19.9 10*3/uL — ABNORMAL HIGH (ref 4.0–10.5)
nRBC: 0.4 % — ABNORMAL HIGH (ref 0.0–0.2)

## 2020-10-25 LAB — COMPREHENSIVE METABOLIC PANEL
ALT: 11 U/L (ref 0–44)
AST: 20 U/L (ref 15–41)
Albumin: 3.8 g/dL (ref 3.5–5.0)
Alkaline Phosphatase: 90 U/L (ref 38–126)
Anion gap: 10 (ref 5–15)
BUN: 21 mg/dL — ABNORMAL HIGH (ref 6–20)
CO2: 23 mmol/L (ref 22–32)
Calcium: 8.9 mg/dL (ref 8.9–10.3)
Chloride: 99 mmol/L (ref 98–111)
Creatinine, Ser: 0.88 mg/dL (ref 0.44–1.00)
GFR, Estimated: >60 mL/min (ref >60)
Glucose, Bld: 148 mg/dL — ABNORMAL HIGH (ref 70–99)
Potassium: 3.6 mmol/L (ref 3.5–5.1)
Sodium: 132 mmol/L — ABNORMAL LOW (ref 135–145)
Total Bilirubin: 0.3 mg/dL (ref 0.3–1.2)
Total Protein: 6.9 g/dL (ref 6.5–8.1)

## 2020-10-25 MED ORDER — SODIUM CHLORIDE 0.9% FLUSH
10.0000 mL | Freq: Once | INTRAVENOUS | Status: DC | PRN
  Filled 2020-10-25: qty 10

## 2020-10-25 MED ORDER — HEPARIN SOD (PORK) LOCK FLUSH 100 UNIT/ML IV SOLN
500.0000 [IU] | Freq: Once | INTRAVENOUS | Status: AC | PRN
Start: 2020-10-25 — End: 2020-10-25
  Administered 2020-10-25: 500 [IU]
  Filled 2020-10-25: qty 5

## 2020-10-25 MED ORDER — SODIUM CHLORIDE 0.9 % IV SOLN
Freq: Once | INTRAVENOUS | Status: AC
  Filled 2020-10-25: qty 250

## 2020-10-25 MED ORDER — HEPARIN SOD (PORK) LOCK FLUSH 100 UNIT/ML IV SOLN
INTRAVENOUS | Status: AC
  Filled 2020-10-25: qty 5

## 2020-10-25 MED ORDER — SODIUM CHLORIDE 0.9% FLUSH
10.0000 mL | Freq: Once | INTRAVENOUS | Status: AC
  Administered 2020-10-25: 10 mL via INTRAVENOUS
  Filled 2020-10-25: qty 10

## 2020-10-25 NOTE — Progress Notes (Signed)
Patient had a recent hospitalization for neutropenic fever. Patient still very weak/fatigue and c/o shortness of breath after ambulating the cancer center stairs. The patient c/o intermittent nausea. No vomiting.

## 2020-10-25 NOTE — Progress Notes (Signed)
[facility] NOTE  Patient Wells Team: Beth Wells, Beth Wells as PCP - General Beth Sickle, [name]  CHIEF COMPLAINTS/PURPOSE OF CONSULTATION: Breast cancer  #  Oncology History Overview Note  # OCT 2021-RIGHT BREAST- TRIPLE NEGATIVE [place]; [US/mammo-91m]; Dr.Cintron; OCT 2021-USThere is a 7 mm mass in the right breast at 10 o'clock, favored to represent a complicated cyst;  No evidence of right axillary lymphadenopathy].   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 [Beth Wells]- skin biopsy- on cymblata+ Lyrica  # DEC 2021-status post evaluation with reproductive endocrinology [9-10% chance of viable pregnancy] # # SURVIVORSHIP:   # GENETICS: BARD  DIAGNOSIS: Breast cancer-triple negative  STAGE:  I       ;  GOALS: cure  CURRENT/MOST RECENT THERAPY : AC-T    Carcinoma of upper-outer quadrant of right breast in female, estrogen receptor negative ([place]  07/13/2020 Initial Diagnosis   Carcinoma of upper-outer quadrant of right breast in female, estrogen receptor negative ([facility]    Genetic Testing   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 The report date is 08/02/2020.  The Multi-Cancer Panel offered by [facility] includes sequencing and/or deletion duplication testing of the following 85 genes: AIP, ALK, APC, ATM, AXIN2,BAP1,  BARD1, BLM, BMPR1A, BRCA1, BRCA2, BRIP1, CASR, CDC73, CDH1, CDK4, CDKN1B, CDKN1C, CDKN2A (p14ARF), CDKN2A (p16INK4a), CEBPA, CHEK2, CTNNA1, DICER1, DIS3L2, EGFR (c.2369C>T, p.Thr790Met variant only), EPCAM (Deletion/duplication testing only), FH, FLCN, GATA2, GPC3, GREM1 (Promoter region deletion/duplication testing only), HOXB13 (c.251G>A, p.Gly84Glu), HRAS, KIT, MAX, MEN1, MET, MITF (c.952G>A, p.Glu318Lys variant only), MLH1, MSH2, MSH3, MSH6, MUTYH, NBN, NF1, NF2, NTHL1,  PALB2, PDGFRA, PHOX2B, PMS2, POLD1, POLE, POT1, PRKAR1A, PTCH1, PTEN, RAD50, RAD51C, RAD51D, RB1, RECQL4, RET, RNF43, RUNX1, SDHAF2, SDHA (sequence changes only), SDHB, SDHC, SDHD, SMAD4, SMARCA4, SMARCB1, SMARCE1, STK11, SUFU, TERC, TERT, TMEM127, TP53, TSC1, TSC2, VHL, WRN and WT1.    09/10/2020 -  Chemotherapy    Patient is on Treatment Plan: BREAST ADJUVANT DOSE DENSE AC Q14D / PACLITAXEL Q7D         HISTORY OF PRESENTING ILLNESS:  Beth Flippo89y.[id number].  female   with stage I triple negative breast cancer currently on adjuvant chemotherapy is here for follow-up.  S/p cycle #3 of AC-patient was admitted hospital for neutropenic fever.  Work-up was negative-discharged on Levaquin.  Nadir hemoglobin 6.6 improved with the transfusion.  Patient continues to complain of worsening fatigue.  She also complains of sore in the mouth.  Complains of shortness of breath on exertion.  No chest pain.  Poor appetite.  No abdominal pain nausea vomiting.  Review of Systems  Constitutional: Positive for malaise/fatigue. Negative for chills, diaphoresis, fever and weight loss.  HENT: Negative for nosebleeds and sore throat.   Eyes: Negative for double vision.  Respiratory: Negative for cough, hemoptysis, sputum production, shortness of breath and wheezing.   Cardiovascular: Negative for chest pain, palpitations, orthopnea and leg swelling.  Gastrointestinal: Negative for abdominal pain, blood in stool, diarrhea, heartburn, melena and vomiting.  Genitourinary: Negative for dysuria, frequency and urgency.  Musculoskeletal: Negative for back pain and joint pain.  Skin: Negative.  Negative for itching and rash.  Neurological: Positive for tingling. Negative for dizziness, focal weakness, weakness and headaches.  Endo/Heme/Allergies: Does not bruise/bleed easily.  Psychiatric/Behavioral: Negative for depression. The patient is not nervous/anxious and does not have insomnia.  MEDICAL HISTORY:  Past  Medical History:  Diagnosis Date  . Breast cancer ([place]) 08/05/2020  . Carcinoma of upper-outer quadrant of right breast in female, estrogen receptor negative ([place]) 07/13/2020  . Complication of anesthesia    hard to wake up-very dizzy feeling like she was Wells to pass out  . GERD (gastroesophageal reflux disease)    no meds  . H/O foot surgery 08/2018  . History of kidney stones    h/o  . Hypertension   . PONV (postoperative nausea and vomiting)    n/v aftter kidney stone surgery    SURGICAL HISTORY: Past Surgical History:  Procedure Laterality Date  . BREAST BIOPSY Right 07/06/2020   u/s bx Q clip path pending  . BREAST RECONSTRUCTION WITH PLACEMENT OF TISSUE EXPANDER AND FLEX HD (ACELLULAR HYDRATED DERMIS) Right 08/05/2020   Procedure: RIGHT BREAST RECONSTRUCTION WITH PLACEMENT OF TISSUE EXPANDER AND FLEX HD (ACELLULAR HYDRATED DERMIS);  Surgeon: Beth Going, DO;  Location: [facility] ORS;  Service: Plastics;  Laterality: Right;  . BUNIONECTOMY Right    titanium pin  . CERVICAL CONE BIOPSY    . DILATION AND EVACUATION N/A 03/12/2018   Procedure: DILATATION AND EVACUATION;  Surgeon: Beth Kindler, MD;  Location: [facility];  Service: Gynecology;  Laterality: N/A;  . KIDNEY STONE SURGERY    . LAPAROSCOPIC OVARIAN CYSTECTOMY Right 06/07/2019   Procedure: LAPAROSCOPIC OVARIAN CYSTECTOMY;  Surgeon: Beth Kindler, MD;  Location: [facility];  Service: Gynecology;  Laterality: Right;  . LAPAROSCOPY N/A 06/07/2019   Procedure: LAPAROSCOPY Beth Wells BIOPSIES;  Surgeon: Beth Kindler, MD;  Location: [facility];  Service: Gynecology;  Laterality: N/A;  . PORTACATH PLACEMENT Left 08/05/2020   Procedure: INSERTION PORT-A-CATH;  Surgeon: Beth Pun, MD;  Location: [facility];  Service: General;  Laterality: Left;  . TOTAL MASTECTOMY Right 08/05/2020   Procedure: TOTAL MASTECTOMY w/ Sentinel Node;  Surgeon: Beth Pun, MD;  Location: [facility];  Service:  General;  Laterality: Right;    SOCIAL HISTORY: Social History   Socioeconomic History  . Marital status: Married    Spouse name: Beth Wells  . Number of children: 0  . Years of education: Not on file  . Highest education level: Not on file  Occupational History  . Occupation: Market researcher: [employer]  Tobacco Use  . Smoking status: Never Smoker  . Smokeless tobacco: Never Used  Vaping Use  . Vaping Use: Never used  Substance and Sexual Activity  . Alcohol use: Not Currently  . Drug use: Never  . Sexual activity: Yes    Birth control/protection: Condom  Other Topics Concern  . 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 Lives in [place].    Social Determinants of Health   Financial Resource Strain: Low Risk   . Difficulty of Paying Living Expenses: Not hard at all  Food Insecurity: Not on file  Transportation Needs: No Transportation Needs  . Lack of Transportation (Medical): No  . Lack of Transportation (Non-Medical): No  Physical Activity: Not on file  Stress: Stress Concern Present  . Feeling of Stress : Very much  Social Connections: Not on file  Intimate Partner Violence: Not on file    FAMILY HISTORY: Family History  Problem Relation Age of Onset  . Cancer Other        great aunt- GI cancer  . Breast cancer Neg Hx     ALLERGIES:  has No Known Allergies.  MEDICATIONS:  Current Outpatient Medications  Medication Sig Dispense Refill  . acetaminophen (TYLENOL) 500 MG tablet Take 1 tablet (500 mg total) by mouth every 6 (six) hours as needed. For use AFTER surgery 30 tablet 0  . ALPHA-LIPOIC ACID PO Take 500 mg by mouth daily.     Marland Kitchen amphetamine-dextroamphetamine (ADDERALL XR) 30 MG 24 hr capsule Take 30 mg by mouth daily.     . DULoxetine (CYMBALTA) 20 MG capsule Take 2 capsules (40 mg total) by mouth at bedtime. (Patient taking differently: Take 20  mg by mouth at bedtime.) 60 capsule 2  . levofloxacin (LEVAQUIN) 750 MG tablet Take 1 tablet (750 mg total) by mouth daily for 7 days. 7 tablet 0  . lidocaine-prilocaine (EMLA) cream Apply 1 application topically as needed. On the port generously; 30-45 mins prior to chemo appt. 30 g 3  . magic mouthwash w/lidocaine SOLN Take 5 mLs by mouth. 80 ml viscous lidocaine 2%, 80 ml Mylanta, 80 ml Diphenhydramine 12.5 mg/5 ml Elixir, 80 ml Nystatin 100,000 Unit suspension, 80 ml Prednisolone 15 mg/45m, 80 ml Distilled Water. Sig: Swish/Swallow 5-10 ml four times a day as needed. Dispense 480 ml. 3RFs    . ondansetron (ZOFRAN) 8 MG tablet One pill every 8 hours as needed for nausea/vomitting. 40 tablet 3  . pregabalin (LYRICA) 100 MG capsule Take 1 capsule (100 mg total) by mouth 2 (two) times daily. 180 capsule 1  . Probiotic Product (PROBIOTIC ADVANCED PO) Take 1 capsule by mouth daily.     . prochlorperazine (COMPAZINE) 10 MG tablet Take 1 tablet (10 mg total) by mouth every 6 (six) hours as needed for nausea or vomiting. 40 tablet 1  . sodium fluoride (FLUORISHIELD) 1.1 % GEL dental gel Place 1 application onto teeth 2 (two) times daily.    .Marland Kitchentrolamine salicylate (ASPERCREME) 10 % cream Apply 1 application topically as needed for muscle pain.    .Marland Kitchenlisinopril (ZESTRIL) 5 MG tablet Take 5 mg by mouth every evening.  (Patient not taking: Reported on 10/25/2020)     No current facility-administered medications for this visit.      .Marland Kitchen PHYSICAL EXAMINATION: ECOG PERFORMANCE STATUS: 0 - Asymptomatic  Vitals:   10/25/20 0856  BP: 127/86  Pulse: (!) 112  Resp: 18  Temp: (!) 96.3 F (35.7 C)  SpO2: 100%   Filed Weights   10/25/20 0856  Weight: 97 lb 12.8 oz (44.4 kg)    Physical Exam Constitutional:      Comments: Alone; ambulating independently.   HENT:     Head: Normocephalic and atraumatic.     Mouth/Throat:     Pharynx: No oropharyngeal exudate.  Eyes:     Pupils: Pupils are equal,  round, and reactive to light.  Cardiovascular:     Rate and Rhythm: Normal rate and regular rhythm.  Pulmonary:     Effort: Pulmonary effort is normal. No respiratory distress.     Breath sounds: Normal breath sounds. No wheezing.  Abdominal:     General: Bowel sounds are normal. There is no distension.     Palpations: Abdomen is soft. There is no mass.     Tenderness: There is no abdominal tenderness. There is no guarding or rebound.  Musculoskeletal:        General: No tenderness. Normal range of motion.     Cervical back: Normal range of motion and neck supple.  Skin:    General: Skin is warm.  Neurological:     Mental Status: She is alert and oriented to person, place, and time.  Psychiatric:        Mood and Affect: Affect normal.      LABORATORY DATA:  I have reviewed the data as listed Lab Results  Component Value Date   WBC 19.9 (H) 10/25/2020   HGB 8.3 (L) 10/25/2020   HCT 24.2 (L) 10/25/2020   MCV 91.0 10/25/2020   PLT 337 10/25/2020   Recent Labs    10/11/20 1053 10/18/20 2156 10/19/20 0458 10/20/20 0440 10/21/20 0634 10/25/20 0837  NA 135 133*   < > 138 138 132*  K 3.8 4.4   < > 3.6 4.0 3.6  CL 102 99   < > 106 105 99  CO2 26 24   < > '24 25 23  ' GLUCOSE 100* 118*   < > 96 89 148*  BUN 24* 21*   < > 7 5* 21*  CREATININE 0.79 0.65   < > 0.63 0.61 0.88  CALCIUM 9.0 9.4   < > 8.4* 8.5* 8.9  GFRNONAA >60 >60   < > >60 >60 >60  PROT 7.2 7.4  --   --   --  6.9  ALBUMIN 3.9 4.1  --   --   --  3.8  AST 17 16  --   --   --  20  ALT 13 10  --   --   --  11  ALKPHOS 92 105  --   --   --  90  BILITOT 0.2* 0.5  --   --   --  0.3   < > = values in this interval not displayed.    RADIOGRAPHIC STUDIES: I have personally reviewed the radiological images as listed and agreed with the findings in the report. DG Chest 2 View  Result Date: 10/18/2020 CLINICAL DATA:  Breast cancer patient with fever today. Chills and weakness. Recent mastectomy. EXAM: CHEST - 2 VIEW  COMPARISON:  08/05/2020 FINDINGS: Hyperinflation of the lungs. Power port type left central venous catheter with tip over the low SVC region. No pneumothorax. Heart size and pulmonary vascularity are normal. Postoperative right mastectomy with breast reconstruction. Lungs are clear. No airspace disease, consolidation, or edema. No pleural effusions. No pneumothorax. Mediastinal contours appear intact. IMPRESSION: No active cardiopulmonary disease. Electronically Signed   By: Lucienne Capers M.D.   On: 10/18/2020 23:25    ASSESSMENT & PLAN:   Carcinoma of upper-outer quadrant of right breast in female, estrogen receptor negative ([place]) # Stage I triple negative breast cancer on adjuvant AC- Taxol chemo.  Tolerating chemotherapy with moderate side effects [neutropenic fever see below].  # HOLD cycle #4 AC today. Labs today reviewed [given the recent neutropenic fever]; plan #4-next week; will reduce the dose by 20%.  #Neutropenic fever-s/p cycle #3 of AC.  Today white count is 19,000 likely secondary to Fulphila.     #Mucositis/poor p.o. intake-grade 2 secondary to chemotherapy; continue mouthwash/plan IV fluids today..  # Small fiber neuropathy- [Beth Wells]-on Cymbalta/Lyrica-STABLE.   # Fertility preserving- lupron monthly [last 1/11];   # DISPOSITION:  # HOLD chemo today; IVFs over 1 hour # follow up in 2 weeks- MD; labs- cbc/cmp; Adriamycin- Cytoxan; fluphila-d-2;zoladex -Dr.B   All questions were answered. The patient/family knows to call the clinic with any problems, questions or concerns.    Beth Sickle, MD 10/25/2020 9:51 AM

## 2020-10-25 NOTE — Assessment & Plan Note (Addendum)
#   Stage I triple negative breast cancer on adjuvant AC- Taxol chemo.  Tolerating chemotherapy with moderate side effects [neutropenic fever see below].  # HOLD cycle #4 AC today. Labs today reviewed [given the recent neutropenic fever]; plan #4-next week; will reduce the dose by 20%.  #Neutropenic fever-s/p cycle #3 of AC.  Today white count is 19,000 likely secondary to Fulphila.     #Mucositis/poor p.o. intake-grade 2 secondary to chemotherapy; continue mouthwash/plan IV fluids today..  # Small fiber neuropathy- [Dr.Shah]-on Cymbalta/Lyrica-STABLE.   # Fertility preserving- lupron monthly [last 1/11];   # DISPOSITION:  # HOLD chemo today; IVFs over 1 hour # follow up in 2 weeks- MD; labs- cbc/cmp; Adriamycin- Cytoxan; fluphila-d-2;zoladex -Dr.B

## 2020-10-25 NOTE — Progress Notes (Signed)
Treatment held today per Dr. Rogue Bussing.  Pt received 1 Liter of NS IV over an hour.  Pt tolerated IVF infusion well and left the infusion suite stable and ambulatory.

## 2020-10-26 ENCOUNTER — Inpatient Hospital Stay: Payer: BC Managed Care – PPO

## 2020-11-02 ENCOUNTER — Encounter: Payer: Self-pay | Admitting: Internal Medicine

## 2020-11-08 ENCOUNTER — Inpatient Hospital Stay: Payer: BC Managed Care – PPO | Attending: Internal Medicine

## 2020-11-08 ENCOUNTER — Inpatient Hospital Stay: Payer: BC Managed Care – PPO

## 2020-11-08 ENCOUNTER — Other Ambulatory Visit: Payer: Self-pay

## 2020-11-08 ENCOUNTER — Inpatient Hospital Stay (HOSPITAL_BASED_OUTPATIENT_CLINIC_OR_DEPARTMENT_OTHER): Payer: BC Managed Care – PPO | Admitting: Internal Medicine

## 2020-11-08 DIAGNOSIS — I10 Essential (primary) hypertension: Secondary | ICD-10-CM | POA: Diagnosis not present

## 2020-11-08 DIAGNOSIS — Z5111 Encounter for antineoplastic chemotherapy: Secondary | ICD-10-CM | POA: Insufficient documentation

## 2020-11-08 DIAGNOSIS — Z79899 Other long term (current) drug therapy: Secondary | ICD-10-CM | POA: Insufficient documentation

## 2020-11-08 DIAGNOSIS — Z171 Estrogen receptor negative status [ER-]: Secondary | ICD-10-CM | POA: Insufficient documentation

## 2020-11-08 DIAGNOSIS — Z9011 Acquired absence of right breast and nipple: Secondary | ICD-10-CM | POA: Diagnosis not present

## 2020-11-08 DIAGNOSIS — C50411 Malignant neoplasm of upper-outer quadrant of right female breast: Secondary | ICD-10-CM | POA: Insufficient documentation

## 2020-11-08 LAB — CBC WITH DIFFERENTIAL/PLATELET
Abs Immature Granulocytes: 0.03 10*3/uL (ref 0.00–0.07)
Basophils Absolute: 0.1 10*3/uL (ref 0.0–0.1)
Basophils Relative: 1 %
Eosinophils Absolute: 0.1 10*3/uL (ref 0.0–0.5)
Eosinophils Relative: 3 %
HCT: 26.3 % — ABNORMAL LOW (ref 36.0–46.0)
Hemoglobin: 8.7 g/dL — ABNORMAL LOW (ref 12.0–15.0)
Immature Granulocytes: 1 %
Lymphocytes Relative: 20 %
Lymphs Abs: 0.9 10*3/uL (ref 0.7–4.0)
MCH: 30.3 pg (ref 26.0–34.0)
MCHC: 33.1 g/dL (ref 30.0–36.0)
MCV: 91.6 fL (ref 80.0–100.0)
Monocytes Absolute: 0.6 10*3/uL (ref 0.1–1.0)
Monocytes Relative: 14 %
Neutro Abs: 2.7 10*3/uL (ref 1.7–7.7)
Neutrophils Relative %: 61 %
Platelets: 457 10*3/uL — ABNORMAL HIGH (ref 150–400)
RBC: 2.87 MIL/uL — ABNORMAL LOW (ref 3.87–5.11)
RDW: 14.6 % (ref 11.5–15.5)
WBC: 4.4 10*3/uL (ref 4.0–10.5)
nRBC: 0 % (ref 0.0–0.2)

## 2020-11-08 LAB — COMPREHENSIVE METABOLIC PANEL
ALT: 10 U/L (ref 0–44)
AST: 19 U/L (ref 15–41)
Albumin: 3.7 g/dL (ref 3.5–5.0)
Alkaline Phosphatase: 57 U/L (ref 38–126)
Anion gap: 10 (ref 5–15)
BUN: 27 mg/dL — ABNORMAL HIGH (ref 6–20)
CO2: 24 mmol/L (ref 22–32)
Calcium: 9 mg/dL (ref 8.9–10.3)
Chloride: 104 mmol/L (ref 98–111)
Creatinine, Ser: 0.73 mg/dL (ref 0.44–1.00)
GFR, Estimated: >60 mL/min (ref >60)
Glucose, Bld: 95 mg/dL (ref 70–99)
Potassium: 3.9 mmol/L (ref 3.5–5.1)
Sodium: 138 mmol/L (ref 135–145)
Total Bilirubin: 0.3 mg/dL (ref 0.3–1.2)
Total Protein: 7.1 g/dL (ref 6.5–8.1)

## 2020-11-08 MED ORDER — HEPARIN SOD (PORK) LOCK FLUSH 100 UNIT/ML IV SOLN
INTRAVENOUS | Status: AC
  Filled 2020-11-08: qty 5

## 2020-11-08 MED ORDER — SODIUM CHLORIDE 0.9 % IV SOLN
480.0000 mg/m2 | Freq: Once | INTRAVENOUS | Status: AC
  Administered 2020-11-08: 680 mg via INTRAVENOUS
  Filled 2020-11-08: qty 34

## 2020-11-08 MED ORDER — PROCHLORPERAZINE MALEATE 10 MG PO TABS: 10.0000 mg | ORAL_TABLET | Freq: Four times a day (QID) | ORAL | 3 refills | Status: DC | PRN

## 2020-11-08 MED ORDER — DOXORUBICIN HCL CHEMO IV INJECTION 2 MG/ML
48.0000 mg/m2 | Freq: Once | INTRAVENOUS | Status: AC
Start: 2020-11-08 — End: 2020-11-08
  Administered 2020-11-08: 68 mg via INTRAVENOUS
  Filled 2020-11-08: qty 34

## 2020-11-08 MED ORDER — HEPARIN SOD (PORK) LOCK FLUSH 100 UNIT/ML IV SOLN
500.0000 [IU] | Freq: Once | INTRAVENOUS | Status: AC | PRN
  Administered 2020-11-08: 500 [IU]
  Filled 2020-11-08: qty 5

## 2020-11-08 MED ORDER — SODIUM CHLORIDE 0.9 % IV SOLN
Freq: Once | INTRAVENOUS | Status: AC
  Filled 2020-11-08: qty 250

## 2020-11-08 MED ORDER — SODIUM CHLORIDE 0.9 % IV SOLN
150.0000 mg | Freq: Once | INTRAVENOUS | Status: AC
  Administered 2020-11-08: 150 mg via INTRAVENOUS
  Filled 2020-11-08: qty 150

## 2020-11-08 MED ORDER — PALONOSETRON HCL INJECTION 0.25 MG/5ML
0.2500 mg | Freq: Once | INTRAVENOUS | Status: AC
  Administered 2020-11-08: 0.25 mg via INTRAVENOUS
  Filled 2020-11-08: qty 5

## 2020-11-08 MED ORDER — SODIUM CHLORIDE 0.9 % IV SOLN
10.0000 mg | Freq: Once | INTRAVENOUS | Status: AC
  Administered 2020-11-08: 10 mg via INTRAVENOUS
  Filled 2020-11-08: qty 10

## 2020-11-08 NOTE — Progress Notes (Signed)
one [facility] NOTE  Patient Care Team: [place], [name], [name]  CHIEF COMPLAINTS/PURPOSE OF CONSULTATION: Breast cancer  #  Oncology History Overview Note  # OCT 2021-RIGHT BREAST- TRIPLE NEGATIVE [place]; [US/mammo-9m]; Dr.Cintron; OCT 2021-USThere is a 7 mm mass in the right breast at 10 o'clock, favored to represent a complicated cyst;  No evidence of right axillary lymphadenopathy].   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 [Dr.Shah]- skin biopsy- on cymblata+ Lyrica  # DEC 2021-status post evaluation with reproductive endocrinology [9-10% chance of viable pregnancy] # # SURVIVORSHIP:   # GENETICS: BARD  DIAGNOSIS: Breast cancer-triple negative  STAGE:  I       ;  GOALS: cure  CURRENT/MOST RECENT THERAPY : AC-T    Carcinoma of upper-outer quadrant of right breast in [place], estrogen receptor negative [place]  07/13/2020 Initial Diagnosis   Carcinoma of upper-outer quadrant of right breast in female, estrogen receptor negative ([place]    Genetic Testing   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 The report date is 08/02/2020.  The Multi-Cancer Panel offered by [facility] includes sequencing and/or deletion duplication testing of the following 85 genes: AIP, ALK, APC, ATM, AXIN2,BAP1,  BARD1, BLM, BMPR1A, BRCA1, BRCA2, BRIP1, CASR, CDC73, CDH1, CDK4, CDKN1B, CDKN1C, CDKN2A (p14ARF), CDKN2A (p16INK4a), CEBPA, CHEK2, CTNNA1, DICER1, DIS3L2, EGFR (c.2369C>T, p.Thr790Met variant only), EPCAM (Deletion/duplication testing only), FH, FLCN, GATA2, GPC3, GREM1 (Promoter region deletion/duplication testing only), HOXB13 (c.251G>A, p.Gly84Glu), HRAS, KIT, MAX, MEN1, MET, MITF (c.952G>A, p.Glu318Lys variant only), MLH1, MSH2, MSH3, MSH6, MUTYH, NBN, NF1, NF2, NTHL1,  PALB2, PDGFRA, PHOX2B, PMS2, POLD1, POLE, POT1, PRKAR1A, PTCH1, PTEN, RAD50, RAD51C, RAD51D, RB1, RECQL4, RET, RNF43, RUNX1, SDHAF2, SDHA (sequence changes only), SDHB, SDHC, SDHD, SMAD4, SMARCA4, SMARCB1, SMARCE1, STK11, SUFU, TERC, TERT, TMEM127, TP53, TSC1, TSC2, VHL, WRN and WT1.    09/10/2020 -  Chemotherapy    Patient is on Treatment Plan: BREAST ADJUVANT DOSE DENSE AC Q14D / PACLITAXEL Q7D         HISTORY OF PRESENTING ILLNESS:  [name]89y.[id number].  female   with stage I triple negative breast cancer currently on adjuvant chemotherapy is here for follow-up.  Cycle #3 of Adriamycin Cytoxan delayed because of recent admission for neutropenic fever/extreme fatigue.  Patient states her appetite is improving.  Denies any shortness of breath.  No fevers or chills.  No abdominal pain.  Pain with swallowing is improved.  Review of Systems  Constitutional: Positive for malaise/fatigue. Negative for chills, diaphoresis, fever and weight loss.  HENT: Negative for nosebleeds and sore throat.   Eyes: Negative for double vision.  Respiratory: Negative for cough, hemoptysis, sputum production, shortness of breath and wheezing.   Cardiovascular: Negative for chest pain, palpitations, orthopnea and leg swelling.  Gastrointestinal: Negative for abdominal pain, blood in stool, diarrhea, heartburn, melena and vomiting.  Genitourinary: Negative for dysuria, frequency and urgency.  Musculoskeletal: Negative for back pain and joint pain.  Skin: Negative.  Negative for itching and rash.  Neurological: Positive for tingling. Negative for dizziness, focal weakness, weakness and headaches.  Endo/Heme/Allergies: Does not bruise/bleed easily.  Psychiatric/Behavioral: Negative for depression. The patient is not nervous/anxious and does not have insomnia.      MEDICAL HISTORY:  Past Medical History:  Diagnosis Date  . Breast cancer ([place] 08/05/2020  . Carcinoma  of upper-outer quadrant of right breast  in female, estrogen receptor negative ([place]) 07/13/2020  . Complication of anesthesia    hard to wake up-very dizzy feeling like she was going to pass out  . GERD (gastroesophageal reflux disease)    no meds  . H/O foot surgery 08/2018  . History of kidney stones    h/o  . Hypertension   . PONV (postoperative nausea and vomiting)    n/v aftter kidney stone surgery    SURGICAL HISTORY: Past Surgical History:  Procedure Laterality Date  . BREAST BIOPSY Right 07/06/2020   u/s bx Q clip path pending  . BREAST RECONSTRUCTION WITH PLACEMENT OF TISSUE EXPANDER AND FLEX HD (ACELLULAR HYDRATED DERMIS) Right 08/05/2020   Procedure: RIGHT BREAST RECONSTRUCTION WITH PLACEMENT OF TISSUE EXPANDER AND FLEX HD (ACELLULAR HYDRATED DERMIS);  Surgeon: Wallace Going, DO;  Location: [facility];  Service: Plastics;  Laterality: Right;  . BUNIONECTOMY Right    titanium pin  . CERVICAL CONE BIOPSY    . DILATION AND EVACUATION N/A 03/12/2018   Procedure: DILATATION AND EVACUATION;  Surgeon: [name], MD;  Location: [facility];  Service: Gynecology;  Laterality: N/A;  . KIDNEY STONE SURGERY    . LAPAROSCOPIC OVARIAN CYSTECTOMY Right 06/07/2019   Procedure: LAPAROSCOPIC OVARIAN CYSTECTOMY;  Surgeon: [name], MD;  Location: [facility];  Service: Gynecology;  Laterality: Right;  . LAPAROSCOPY N/A 06/07/2019   Procedure: LAPAROSCOPY Luvenia Redden BIOPSIES;  Surgeon: [name], MD;  Location: [facility];  Service: Gynecology;  Laterality: N/A;  . PORTACATH PLACEMENT Left 08/05/2020   Procedure: INSERTION PORT-A-CATH;  Surgeon: Herbert Pun, MD;  Location: [facility];  Service: General;  Laterality: Left;  . TOTAL MASTECTOMY Right 08/05/2020   Procedure: TOTAL MASTECTOMY w/ Sentinel Node;  Surgeon: Herbert Pun, MD;  Location: [facility];  Service: General;  Laterality: Right;    SOCIAL HISTORY: Social History   Socioeconomic History  . Marital status: Married     Spouse name: [name]  . Number of children: 0  . Years of education: Not on file  . Highest education level: Not on file  Occupational History  . Occupation: Market researcher: [employer]  Tobacco Use  . Smoking status: Never Smoker  . Smokeless tobacco: Never Used  Vaping Use  . Vaping Use: Never used  Substance and Sexual Activity  . Alcohol use: Not Currently  . Drug use: Never  . Sexual activity: Yes    Birth control/protection: Condom  Other Topics Concern  . 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 Lives in [place].    Social Determinants of Health   Financial Resource Strain: Low Risk   . Difficulty of Paying Living Expenses: Not hard at all  Food Insecurity: Not on file  Transportation Needs: No Transportation Needs  . Lack of Transportation (Medical): No  . Lack of Transportation (Non-Medical): No  Physical Activity: Not on file  Stress: Stress Concern Present  . Feeling of Stress : Very much  Social Connections: Not on file  Intimate Partner Violence: Not on file    FAMILY HISTORY: Family History  Problem Relation Age of Onset  . Cancer Other        great aunt- GI cancer  . Breast cancer Neg Hx     ALLERGIES:  has No Known Allergies.  MEDICATIONS:  Current Outpatient Medications  Medication Sig Dispense Refill  . acetaminophen (  TYLENOL) 500 MG tablet Take 1 tablet (500 mg total) by mouth every 6 (six) hours as needed. For use AFTER surgery 30 tablet 0  . ALPHA-LIPOIC ACID PO Take 500 mg by mouth daily.     Marland Kitchen amphetamine-dextroamphetamine (ADDERALL XR) 30 MG 24 hr capsule Take 30 mg by mouth daily.     . DULoxetine (CYMBALTA) 20 MG capsule Take 2 capsules (40 mg total) by mouth at bedtime. (Patient taking differently: Take 20 mg by mouth at bedtime.) 60 capsule 2  . lidocaine-prilocaine (EMLA) cream Apply 1 application topically as needed. On the  port generously; 30-45 mins prior to chemo appt. 30 g 3  . ondansetron (ZOFRAN) 8 MG tablet One pill every 8 hours as needed for nausea/vomitting. 40 tablet 3  . pregabalin (LYRICA) 100 MG capsule Take 1 capsule (100 mg total) by mouth 2 (two) times daily. 180 capsule 1  . Probiotic Product (PROBIOTIC ADVANCED PO) Take 1 capsule by mouth daily.     . prochlorperazine (COMPAZINE) 10 MG tablet Take 1 tablet (10 mg total) by mouth every 6 (six) hours as needed for nausea or vomiting. 40 tablet 3  . sodium fluoride (FLUORISHIELD) 1.1 % GEL dental gel Place 1 application onto teeth 2 (two) times daily.    . SODIUM FLUORIDE 5000 PPM 1.1 % PSTE Take by mouth.    . trolamine salicylate (ASPERCREME) 10 % cream Apply 1 application topically as needed for muscle pain.    . DPH-Lido-AlHydr-MgHydr-Simeth (FIRST-MOUTHWASH BLM) SUSP  (Patient not taking: Reported on 11/08/2020)    . lisinopril (ZESTRIL) 5 MG tablet Take 5 mg by mouth every evening. (Patient not taking: Reported on 11/08/2020)    . magic mouthwash w/lidocaine SOLN Take 5 mLs by mouth. 80 ml viscous lidocaine 2%, 80 ml Mylanta, 80 ml Diphenhydramine 12.5 mg/5 ml Elixir, 80 ml Nystatin 100,000 Unit suspension, 80 ml Prednisolone 15 mg/33m, 80 ml Distilled Water. Sig: Swish/Swallow 5-10 ml four times a day as needed. Dispense 480 ml. 3RFs (Patient not taking: Reported on 11/08/2020)     No current facility-administered medications for this visit.      .Marland Kitchen PHYSICAL EXAMINATION: ECOG PERFORMANCE STATUS: 0 - Asymptomatic  Vitals:   11/08/20 0904  BP: 137/86  Pulse: 81  Resp: 18  Temp: (!) 96.8 F (36 C)   Filed Weights   11/08/20 0904  Weight: 100 lb (45.4 kg)    Physical Exam Constitutional:      Comments: Alone; ambulating independently.   HENT:     Head: Normocephalic and atraumatic.     Mouth/Throat:     Pharynx: No oropharyngeal exudate.  Eyes:     Pupils: Pupils are equal, round, and reactive to light.  Cardiovascular:      Rate and Rhythm: Normal rate and regular rhythm.  Pulmonary:     Effort: Pulmonary effort is normal. No respiratory distress.     Breath sounds: Normal breath sounds. No wheezing.  Abdominal:     General: Bowel sounds are normal. There is no distension.     Palpations: Abdomen is soft. There is no mass.     Tenderness: There is no abdominal tenderness. There is no guarding or rebound.  Musculoskeletal:        General: No tenderness. Normal range of motion.     Cervical back: Normal range of motion and neck supple.  Skin:    General: Skin is warm.  Neurological:     Mental Status: She is alert and oriented  to person, place, and time.  Psychiatric:        Mood and Affect: Affect normal.      LABORATORY DATA:  I have reviewed the data as listed Lab Results  Component Value Date   WBC 4.4 11/08/2020   HGB 8.7 (L) 11/08/2020   HCT 26.3 (L) 11/08/2020   MCV 91.6 11/08/2020   PLT 457 (H) 11/08/2020   Recent Labs    10/18/20 2156 10/19/20 0458 10/21/20 0634 10/25/20 0837 11/08/20 0851  NA 133*   < > 138 132* 138  K 4.4   < > 4.0 3.6 3.9  CL 99   < > 105 99 104  CO2 24   < > '25 23 24  ' GLUCOSE 118*   < > 89 148* 95  BUN 21*   < > 5* 21* 27*  CREATININE 0.65   < > 0.61 0.88 0.73  CALCIUM 9.4   < > 8.5* 8.9 9.0  GFRNONAA >60   < > >60 >60 >60  PROT 7.4  --   --  6.9 7.1  ALBUMIN 4.1  --   --  3.8 3.7  AST 16  --   --  20 19  ALT 10  --   --  11 10  ALKPHOS 105  --   --  90 57  BILITOT 0.5  --   --  0.3 0.3   < > = values in this interval not displayed.    RADIOGRAPHIC STUDIES: I have personally reviewed the radiological images as listed and agreed with the findings in the report. DG Chest 2 View  Result Date: 10/18/2020 CLINICAL DATA:  Breast cancer patient with fever today. Chills and weakness. Recent mastectomy. EXAM: CHEST - 2 VIEW COMPARISON:  08/05/2020 FINDINGS: Hyperinflation of the lungs. Power port type left central venous catheter with tip over the low SVC  region. No pneumothorax. Heart size and pulmonary vascularity are normal. Postoperative right mastectomy with breast reconstruction. Lungs are clear. No airspace disease, consolidation, or edema. No pleural effusions. No pneumothorax. Mediastinal contours appear intact. IMPRESSION: No active cardiopulmonary disease. Electronically Signed   By: Lucienne Capers M.D.   On: 10/18/2020 23:25    ASSESSMENT & PLAN:   Carcinoma of upper-outer quadrant of right breast in female, estrogen receptor negative ([place]) # Stage I triple negative breast cancer on adjuvant AC- Taxol chemo.  Tolerating chemotherapy with moderate side effects [neutropenic fever see below].  # proceed with  cycle #4 AC today. Labs today reviewed [given the recent neutropenic fever]; plan ; will reduce the dose by 20%.  Plan starting Taxol starting in 3 weeks for now.  #Neutropenic fever-s/p cycle #3 of AC; dose reduction as above.  Monitor closely.  #Mucositis/poor p.o. intake-grade 2 secondary to chemotherapy- improved.   # Small fiber neuropathy- [Dr.Shah]-on Cymbalta/Lyrica-STABLE. .   # Fertility preserving- zoladex monthly [last 2/15-tom];   # DISPOSITION:  # chemo today;  # follow up in 3 weeks- MD; labs- cbc/cmp; Taxol;  -Dr.B   All questions were answered. The patient/family knows to call the clinic with any problems, questions or concerns.    Cammie Sickle, MD 11/08/2020 1:59 PM

## 2020-11-08 NOTE — Assessment & Plan Note (Addendum)
#   Stage I triple negative breast cancer on adjuvant AC- Taxol chemo.  Tolerating chemotherapy with moderate side effects [neutropenic fever see below].  # proceed with  cycle #4 AC today. Labs today reviewed [given the recent neutropenic fever]; plan ; will reduce the dose by 20%.  Plan starting Taxol starting in 3 weeks for now.  #Neutropenic fever-s/p cycle #3 of AC; dose reduction as above.  Monitor closely.  #Mucositis/poor p.o. intake-grade 2 secondary to chemotherapy- improved.   # Small fiber neuropathy- [Dr.Shah]-on Cymbalta/Lyrica-STABLE. .   # Fertility preserving- zoladex monthly [last 2/15-tom];   # DISPOSITION:  # chemo today;  # follow up in 3 weeks- MD; labs- cbc/cmp; Taxol;  -Dr.B

## 2020-11-09 ENCOUNTER — Inpatient Hospital Stay: Payer: BC Managed Care – PPO

## 2020-11-09 DIAGNOSIS — Z171 Estrogen receptor negative status [ER-]: Secondary | ICD-10-CM

## 2020-11-09 DIAGNOSIS — C50411 Malignant neoplasm of upper-outer quadrant of right female breast: Secondary | ICD-10-CM | POA: Diagnosis not present

## 2020-11-09 MED ORDER — PEGFILGRASTIM-CBQV 6 MG/0.6ML ~~LOC~~ SOSY
6.0000 mg | PREFILLED_SYRINGE | Freq: Once | SUBCUTANEOUS | Status: AC
  Administered 2020-11-09: 6 mg via SUBCUTANEOUS
  Filled 2020-11-09: qty 0.6

## 2020-11-09 MED ORDER — GOSERELIN ACETATE 3.6 MG ~~LOC~~ IMPL
3.6000 mg | DRUG_IMPLANT | Freq: Once | SUBCUTANEOUS | Status: AC
  Administered 2020-11-09: 3.6 mg via SUBCUTANEOUS
  Filled 2020-11-09: qty 3.6

## 2020-11-11 ENCOUNTER — Inpatient Hospital Stay: Payer: BC Managed Care – PPO

## 2020-11-11 NOTE — Progress Notes (Signed)
Nutrition Follow-up:  Patient with triple negative breast cancer.  Patient s/p mastectomy.  Patient has completed adriamycin/cytoxan.  Planning to start taxol on 3/7. Noted hospital admission for neutropenic fever.    Spoke with patient via phone.  Patient reports no appetite but no nausea, taste alterations, feeling lousy.  Treatment on Monday, 2/14.  Reports ate granola this am.  Reports bowels are moving with miralax. Dulcolax made her vomit.  Reports drinking about 3, 16 oz bottles of liquid per day can't seem to get more in.  Drinking lemonade crystal light, water.  Ensure taste chalky, only tried chocolate.  Reports has been eating broccoli and cheese soup, waffles.  Went to see mother recently and had chicken salad.  Dad came and ate pizza and tacos.  Few days after treatment patient does not feel good.  Husband does not cook.     Medications: reviewed  Labs: reviewed  Anthropometrics:   Weight 100 lb on 2/14  101 lb on 1/17 102 lb on 12/17 104 lb on 12/6   NUTRITION DIAGNOSIS: Inadequate oral intake related to cancer related treatment side effects as evidenced by poor appetite, taste alterations.    INTERVENTION:  Will leave samples of ensure complete (vanilla and chocolate), [employer] shake, orgain shake, ensure clear for patient to pick up and try.  Carnation breakfast essentials powder added to milk still option as well.  Discussed ways to add calories and protein to eating pattern.   Discussed easy to prepare meals when not feeling well.     MONITORING, EVALUATION, GOAL: weight trends, intake   NEXT VISIT: Monday, March 7 during treatment  Meloney Feld B. Zenia Resides, [place], [place] Registered Dietitian [phone] (mobile)

## 2020-11-26 ENCOUNTER — Ambulatory Visit: Payer: BC Managed Care – PPO | Admitting: Plastic Surgery

## 2020-11-29 ENCOUNTER — Inpatient Hospital Stay: Payer: BC Managed Care – PPO

## 2020-11-29 ENCOUNTER — Other Ambulatory Visit: Payer: Self-pay

## 2020-11-29 ENCOUNTER — Encounter: Payer: Self-pay | Admitting: Internal Medicine

## 2020-11-29 ENCOUNTER — Inpatient Hospital Stay (HOSPITAL_BASED_OUTPATIENT_CLINIC_OR_DEPARTMENT_OTHER): Payer: BC Managed Care – PPO | Admitting: Internal Medicine

## 2020-11-29 ENCOUNTER — Inpatient Hospital Stay: Payer: BC Managed Care – PPO | Attending: Internal Medicine

## 2020-11-29 VITALS — BP 130/86 | HR 82 | Resp 20

## 2020-11-29 DIAGNOSIS — Z9011 Acquired absence of right breast and nipple: Secondary | ICD-10-CM | POA: Insufficient documentation

## 2020-11-29 DIAGNOSIS — Z171 Estrogen receptor negative status [ER-]: Secondary | ICD-10-CM

## 2020-11-29 DIAGNOSIS — Z5111 Encounter for antineoplastic chemotherapy: Secondary | ICD-10-CM | POA: Diagnosis not present

## 2020-11-29 DIAGNOSIS — I10 Essential (primary) hypertension: Secondary | ICD-10-CM | POA: Insufficient documentation

## 2020-11-29 DIAGNOSIS — C50411 Malignant neoplasm of upper-outer quadrant of right female breast: Secondary | ICD-10-CM | POA: Insufficient documentation

## 2020-11-29 DIAGNOSIS — T451X5A Adverse effect of antineoplastic and immunosuppressive drugs, initial encounter: Secondary | ICD-10-CM | POA: Diagnosis not present

## 2020-11-29 DIAGNOSIS — R5381 Other malaise: Secondary | ICD-10-CM | POA: Insufficient documentation

## 2020-11-29 DIAGNOSIS — R5383 Other fatigue: Secondary | ICD-10-CM | POA: Diagnosis not present

## 2020-11-29 DIAGNOSIS — G620 Drug-induced polyneuropathy: Secondary | ICD-10-CM | POA: Insufficient documentation

## 2020-11-29 DIAGNOSIS — Z79899 Other long term (current) drug therapy: Secondary | ICD-10-CM | POA: Diagnosis not present

## 2020-11-29 LAB — CBC WITH DIFFERENTIAL/PLATELET
Abs Immature Granulocytes: 0.02 10*3/uL (ref 0.00–0.07)
Basophils Absolute: 0 10*3/uL (ref 0.0–0.1)
Basophils Relative: 1 %
Eosinophils Absolute: 0.1 10*3/uL (ref 0.0–0.5)
Eosinophils Relative: 1 %
HCT: 28 % — ABNORMAL LOW (ref 36.0–46.0)
Hemoglobin: 9 g/dL — ABNORMAL LOW (ref 12.0–15.0)
Immature Granulocytes: 1 %
Lymphocytes Relative: 16 %
Lymphs Abs: 0.6 10*3/uL — ABNORMAL LOW (ref 0.7–4.0)
MCH: 30.2 pg (ref 26.0–34.0)
MCHC: 32.1 g/dL (ref 30.0–36.0)
MCV: 94 fL (ref 80.0–100.0)
Monocytes Absolute: 0.8 10*3/uL (ref 0.1–1.0)
Monocytes Relative: 20 %
Neutro Abs: 2.3 10*3/uL (ref 1.7–7.7)
Neutrophils Relative %: 61 %
Platelets: 460 10*3/uL — ABNORMAL HIGH (ref 150–400)
RBC: 2.98 MIL/uL — ABNORMAL LOW (ref 3.87–5.11)
RDW: 16 % — ABNORMAL HIGH (ref 11.5–15.5)
WBC: 3.8 10*3/uL — ABNORMAL LOW (ref 4.0–10.5)
nRBC: 0 % (ref 0.0–0.2)

## 2020-11-29 LAB — COMPREHENSIVE METABOLIC PANEL
ALT: 10 U/L (ref 0–44)
AST: 19 U/L (ref 15–41)
Albumin: 3.8 g/dL (ref 3.5–5.0)
Alkaline Phosphatase: 63 U/L (ref 38–126)
Anion gap: 10 (ref 5–15)
BUN: 24 mg/dL — ABNORMAL HIGH (ref 6–20)
CO2: 24 mmol/L (ref 22–32)
Calcium: 9.2 mg/dL (ref 8.9–10.3)
Chloride: 102 mmol/L (ref 98–111)
Creatinine, Ser: 0.62 mg/dL (ref 0.44–1.00)
GFR, Estimated: >60 mL/min (ref >60)
Glucose, Bld: 103 mg/dL — ABNORMAL HIGH (ref 70–99)
Potassium: 3.6 mmol/L (ref 3.5–5.1)
Sodium: 136 mmol/L (ref 135–145)
Total Bilirubin: 0.7 mg/dL (ref 0.3–1.2)
Total Protein: 7.3 g/dL (ref 6.5–8.1)

## 2020-11-29 MED ORDER — SODIUM CHLORIDE 0.9 % IV SOLN
20.0000 mg | Freq: Once | INTRAVENOUS | Status: AC
  Administered 2020-11-29: 20 mg via INTRAVENOUS
  Filled 2020-11-29: qty 20

## 2020-11-29 MED ORDER — HEPARIN SOD (PORK) LOCK FLUSH 100 UNIT/ML IV SOLN
500.0000 [IU] | Freq: Once | INTRAVENOUS | Status: AC
  Administered 2020-11-29: 500 [IU] via INTRAVENOUS
  Filled 2020-11-29: qty 5

## 2020-11-29 MED ORDER — DIPHENHYDRAMINE HCL 50 MG/ML IJ SOLN
50.0000 mg | Freq: Once | INTRAMUSCULAR | Status: AC
  Administered 2020-11-29: 50 mg via INTRAVENOUS
  Filled 2020-11-29: qty 1

## 2020-11-29 MED ORDER — FAMOTIDINE IN NACL 20-0.9 MG/50ML-% IV SOLN
20.0000 mg | Freq: Once | INTRAVENOUS | Status: AC
  Administered 2020-11-29: 20 mg via INTRAVENOUS
  Filled 2020-11-29: qty 50

## 2020-11-29 MED ORDER — SODIUM CHLORIDE 0.9 % IV SOLN
Freq: Once | INTRAVENOUS | Status: AC
  Filled 2020-11-29: qty 250

## 2020-11-29 MED ORDER — SODIUM CHLORIDE 0.9 % IV SOLN
60.0000 mg/m2 | Freq: Once | INTRAVENOUS | Status: AC
  Administered 2020-11-29: 84 mg via INTRAVENOUS
  Filled 2020-11-29: qty 14

## 2020-11-29 MED ORDER — SODIUM CHLORIDE 0.9% FLUSH
10.0000 mL | Freq: Once | INTRAVENOUS | Status: AC
  Administered 2020-11-29: 10 mL via INTRAVENOUS
  Filled 2020-11-29: qty 10

## 2020-11-29 NOTE — Progress Notes (Signed)
[facility] CONSULT NOTE  Patient Care Team: [place], [facility] as PCP - General Brahmanday, Govinda R, [name]  CHIEF COMPLAINTS/PURPOSE OF CONSULTATION: Breast cancer  #  Oncology History Overview Note  # OCT 2021-RIGHT BREAST- TRIPLE NEGATIVE IMC; [US/mammo-7mm]; Dr.Cintron; OCT 2021-USThere is a 7 mm mass in the right breast at 10 o'clock, favored to represent a complicated cyst;  No evidence of right axillary lymphadenopathy].   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 [Dr.Shah]- skin biopsy- on cymblata+ Lyrica  # DEC 2021-status post evaluation with reproductive endocrinology [9-10% chance of viable pregnancy] # # SURVIVORSHIP:   # GENETICS: BARD  DIAGNOSIS: Breast cancer-triple negative  STAGE:  I       ;  GOALS: cure  CURRENT/MOST RECENT THERAPY : AC-T    Carcinoma of upper-outer quadrant of right breast in female, estrogen receptor negative (HCC)  07/13/2020 Initial Diagnosis   Carcinoma of upper-outer quadrant of right breast in female, estrogen receptor negative (HCC)    Genetic Testing   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 The report date is 08/02/2020.  The Multi-Cancer Panel offered by [facility] includes sequencing and/or deletion duplication testing of the following 85 genes: AIP, ALK, APC, ATM, AXIN2,BAP1,  BARD1, BLM, BMPR1A, BRCA1, BRCA2, BRIP1, CASR, CDC73, CDH1, CDK4, CDKN1B, CDKN1C, CDKN2A (p14ARF), CDKN2A (p16INK4a), CEBPA, CHEK2, CTNNA1, DICER1, DIS3L2, EGFR (c.2369C>T, p.Thr790Met variant only), EPCAM (Deletion/duplication testing only), FH, FLCN, GATA2, GPC3, GREM1 (Promoter region deletion/duplication testing only), HOXB13 (c.251G>A, p.Gly84Glu), HRAS, KIT, MAX, MEN1, MET, MITF (c.952G>A, p.Glu318Lys variant only), MLH1,  MSH2, MSH3, MSH6, MUTYH, NBN, NF1, NF2, NTHL1, PALB2, PDGFRA, PHOX2B, PMS2, POLD1, POLE, POT1, PRKAR1A, PTCH1, PTEN, RAD50, RAD51C, RAD51D, RB1, RECQL4, RET, RNF43, RUNX1, SDHAF2, SDHA (sequence changes only), SDHB, SDHC, SDHD, SMAD4, SMARCA4, SMARCB1, SMARCE1, STK11, SUFU, TERC, TERT, TMEM127, TP53, TSC1, TSC2, VHL, WRN and WT1.    09/10/2020 -  Chemotherapy    Patient is on Treatment Plan: BREAST ADJUVANT DOSE DENSE AC Q14D / PACLITAXEL Q7D         HISTORY OF PRESENTING ILLNESS:  Beth Wells 45 y.o.  female   with stage I triple negative breast cancer currently s/p adjuvant Adria-cytoxan #4 chemotherapy is here for follow-up.  Patient denies any worsening shortness of breath or cough. Pt denies any abdominal pain.    Patient states her neuropathy had improved while on previous chemotherapy.  However noted to have recurrence of neuropathy.  She is concerned about worsening neuropathy on Taxol.  Review of Systems  Constitutional: Positive for malaise/fatigue. Negative for chills, diaphoresis, fever and weight loss.  HENT: Negative for nosebleeds and sore throat.   Eyes: Negative for double vision.  Respiratory: Negative for cough, hemoptysis, sputum production, shortness of breath and wheezing.   Cardiovascular: Negative for chest pain, palpitations, orthopnea and leg swelling.  Gastrointestinal: Negative for abdominal pain, blood in stool, diarrhea, heartburn, melena and vomiting.  Genitourinary: Negative for dysuria, frequency and urgency.  Musculoskeletal: Negative for back pain and joint pain.  Skin: Negative.  Negative for itching and rash.  Neurological: Positive for tingling. Negative for dizziness, focal weakness, weakness and headaches.  Endo/Heme/Allergies: Does not bruise/bleed easily.  Psychiatric/Behavioral: Negative for depression. The patient is not nervous/anxious and does not have insomnia.      MEDICAL HISTORY:  Past Medical History:    Diagnosis Date  . Breast  cancer ([place]) 08/05/2020  . Carcinoma of upper-outer quadrant of right breast in female, estrogen receptor negative ([place]) 07/13/2020  . Complication of anesthesia    hard to wake up-very dizzy feeling like she was going to pass out  . GERD (gastroesophageal reflux disease)    no meds  . H/O foot surgery 08/2018  . History of kidney stones    h/o  . Hypertension   . PONV (postoperative nausea and vomiting)    n/v aftter kidney stone surgery    SURGICAL HISTORY: Past Surgical History:  Procedure Laterality Date  . BREAST BIOPSY Right 07/06/2020   u/s bx Q clip path pending  . BREAST RECONSTRUCTION WITH PLACEMENT OF TISSUE EXPANDER AND FLEX HD (ACELLULAR HYDRATED DERMIS) Right 08/05/2020   Procedure: RIGHT BREAST RECONSTRUCTION WITH PLACEMENT OF TISSUE EXPANDER AND FLEX HD (ACELLULAR HYDRATED DERMIS);  Surgeon: Wallace Going, DO;  Location: [facility] ORS;  Service: Plastics;  Laterality: Right;  . BUNIONECTOMY Right    titanium pin  . CERVICAL CONE BIOPSY    . DILATION AND EVACUATION N/A 03/12/2018   Procedure: DILATATION AND EVACUATION;  Surgeon: [name], MD;  Location: [facility];  Service: Gynecology;  Laterality: N/A;  . KIDNEY STONE SURGERY    . LAPAROSCOPIC OVARIAN CYSTECTOMY Right 06/07/2019   Procedure: LAPAROSCOPIC OVARIAN CYSTECTOMY;  Surgeon: [name], MD;  Location: [facility];  Service: Gynecology;  Laterality: Right;  . LAPAROSCOPY N/A 06/07/2019   Procedure: LAPAROSCOPY Luvenia Redden BIOPSIES;  Surgeon: [name], MD;  Location: [facility];  Service: Gynecology;  Laterality: N/A;  . PORTACATH PLACEMENT Left 08/05/2020   Procedure: INSERTION PORT-A-CATH;  Surgeon: Herbert Pun, MD;  Location: [facility];  Service: General;  Laterality: Left;  . TOTAL MASTECTOMY Right 08/05/2020   Procedure: TOTAL MASTECTOMY w/ Sentinel Node;  Surgeon: Herbert Pun, MD;  Location: [facility];  Service: General;  Laterality: Right;    SOCIAL  HISTORY: Social History   Socioeconomic History  . Marital status: Married    Spouse name: [name]  . Number of children: 0  . Years of education: Not on file  . Highest education level: Not on file  Occupational History  . Occupation: Market researcher: [employer]  Tobacco Use  . Smoking status: Never Smoker  . Smokeless tobacco: Never Used  Vaping Use  . Vaping Use: Never used  Substance and Sexual Activity  . Alcohol use: Not Currently  . Drug use: Never  . Sexual activity: Yes    Birth control/protection: Condom  Other Topics Concern  . 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 Lives in [place].    Social Determinants of Health   Financial Resource Strain: Low Risk   . Difficulty of Paying Living Expenses: Not hard at all  Food Insecurity: Not on file  Transportation Needs: No Transportation Needs  . Lack of Transportation (Medical): No  . Lack of Transportation (Non-Medical): No  Physical Activity: Not on file  Stress: Stress Concern Present  . Feeling of Stress : Very much  Social Connections: Not on file  Intimate Partner Violence: Not on file    FAMILY HISTORY: Family History  Problem Relation Age of Onset  . Cancer Other        great aunt- GI cancer  . Breast cancer Neg Hx     ALLERGIES:  has No Known Allergies.  MEDICATIONS:  Current Outpatient Medications  Medication Sig Dispense Refill  . acetaminophen (TYLENOL) 500 MG tablet Take 1 tablet (500 mg total) by mouth every 6 (six) hours as needed. For use AFTER surgery 30 tablet 0  . ALPHA-LIPOIC ACID PO Take 500 mg by mouth daily.     . amphetamine-dextroamphetamine (ADDERALL XR) 30 MG 24 hr capsule Take 30 mg by mouth daily.     . DULoxetine (CYMBALTA) 20 MG capsule Take 2 capsules (40 mg total) by mouth at bedtime. (Patient taking differently: Take 20 mg by mouth at bedtime.) 60 capsule 2  .  lidocaine-prilocaine (EMLA) cream Apply 1 application topically as needed. On the port generously; 30-45 mins prior to chemo appt. 30 g 3  . lisinopril (ZESTRIL) 5 MG tablet Take 5 mg by mouth every evening.    . magic mouthwash w/lidocaine SOLN Take 5 mLs by mouth. 80 ml viscous lidocaine 2%, 80 ml Mylanta, 80 ml Diphenhydramine 12.5 mg/5 ml Elixir, 80 ml Nystatin 100,000 Unit suspension, 80 ml Prednisolone 15 mg/5ml, 80 ml Distilled Water. Sig: Swish/Swallow 5-10 ml four times a day as needed. Dispense 480 ml. 3RFs    . ondansetron (ZOFRAN) 8 MG tablet One pill every 8 hours as needed for nausea/vomitting. 40 tablet 3  . pregabalin (LYRICA) 100 MG capsule Take 1 capsule (100 mg total) by mouth 2 (two) times daily. 180 capsule 1  . Probiotic Product (PROBIOTIC ADVANCED PO) Take 1 capsule by mouth daily.     . prochlorperazine (COMPAZINE) 10 MG tablet Take 1 tablet (10 mg total) by mouth every 6 (six) hours as needed for nausea or vomiting. 40 tablet 3  . sodium fluoride (FLUORISHIELD) 1.1 % GEL dental gel Place 1 application onto teeth 2 (two) times daily.    . SODIUM FLUORIDE 5000 PPM 1.1 % PSTE Take by mouth.    . trolamine salicylate (ASPERCREME) 10 % cream Apply 1 application topically as needed for muscle pain.    . DPH-Lido-AlHydr-MgHydr-Simeth (FIRST-MOUTHWASH BLM) SUSP  (Patient not taking: Reported on 11/29/2020)     No current facility-administered medications for this visit.   Facility-Administered Medications Ordered in Other Visits  Medication Dose Route Frequency Provider Last Rate Last Admin  . dexamethasone (DECADRON) 20 mg in sodium chloride 0.9 % 50 mL IVPB  20 mg Intravenous Once Brahmanday, Govinda R, MD      . famotidine (PEPCID) IVPB 20 mg premix  20 mg Intravenous Once Brahmanday, Govinda R, MD      . heparin lock flush 100 unit/mL  500 Units Intravenous Once Brahmanday, Govinda R, MD      . PACLitaxel (TAXOL) 84 mg in sodium chloride 0.9 % 250 mL chemo infusion (</=  80mg/m2)  60 mg/m2 (Treatment Plan Recorded) Intravenous Once Brahmanday, Govinda R, MD          .  PHYSICAL EXAMINATION: ECOG PERFORMANCE STATUS: 0 - Asymptomatic  Vitals:   11/29/20 0830  BP: 126/90  Pulse: 88  Resp: 16  Temp: (!) 96.7 F (35.9 C)  SpO2: 100%   Filed Weights   11/29/20 0830  Weight: 100 lb 9.6 oz (45.6 kg)    Physical Exam Constitutional:      Comments: Alone; ambulating independently.   HENT:     Head: Normocephalic and atraumatic.     Mouth/Throat:     Pharynx: No oropharyngeal exudate.  Eyes:     Pupils: Pupils are equal, round, and reactive to light.  Cardiovascular:     Rate and Rhythm: Normal   rate and regular rhythm.  Pulmonary:     Effort: Pulmonary effort is normal. No respiratory distress.     Breath sounds: Normal breath sounds. No wheezing.  Abdominal:     General: Bowel sounds are normal. There is no distension.     Palpations: Abdomen is soft. There is no mass.     Tenderness: There is no abdominal tenderness. There is no guarding or rebound.  Musculoskeletal:        General: No tenderness. Normal range of motion.     Cervical back: Normal range of motion and neck supple.  Skin:    General: Skin is warm.  Neurological:     Mental Status: She is alert and oriented to person, place, and time.  Psychiatric:        Mood and Affect: Affect normal.      LABORATORY DATA:  I have reviewed the data as listed Lab Results  Component Value Date   WBC 3.8 (L) 11/29/2020   HGB 9.0 (L) 11/29/2020   HCT 28.0 (L) 11/29/2020   MCV 94.0 11/29/2020   PLT 460 (H) 11/29/2020   Recent Labs    10/25/20 0837 11/08/20 0851 11/29/20 0808  NA 132* 138 136  K 3.6 3.9 3.6  CL 99 104 102  CO2 _0 GLUCOSE 148* 95 103*  BUN 21* 27* 24*  CREATININE 0.88 0.73 0.62  CALCIUM 8.9 9.0 9.2  GFRNONAA >60 >60 >60  PROT 6.9 7.1 7.3  ALBUMIN 3.8 3.7 3.8  AST _1 ALT _2 ALKPHOS 90 57 63  BILITOT 0.3 0.3 0.7    RADIOGRAPHIC  STUDIES: I have personally reviewed the radiological images as listed and agreed with the findings in the report. No results found.  ASSESSMENT & PLAN:   Carcinoma of upper-outer quadrant of right breast in female, estrogen receptor negative ([place]) # Stage I triple negative breast cancer on adjuvant AC- Taxol chemo.  Tolerating chemotherapy with moderate side effects [neutropenic fever see below]. S/p 4 cycles of AC- START Taxol weekly.  # proceed with Taxol weekly [at 60 mg/m2-see below]; Labs today reviewed;  acceptable for treatment today.  Again reviewed with the patient the concerns of neuropathy with Taxol.  We will reduce the dose.  She understands that we will have to balance between continuing risk of recurrence versus potentially debilitating side effects of neuropathy.  Will monitor closely.  # Small fiber neuropathy- [Dr.Shah]-on Cymbalta/Lyrica-stable.  Low-dose of Taxol  # Fertility preserving- zoladex monthly plan 3/14]; hot flashes stable.  # DISPOSITION:  # chemo today;  # 1 week- labs- cbc/cmp; Taxol weekly; Zoladex  # follow up in 2 weeks- MD; labs- cbc/cmp; Taxol;  -Dr.B   All questions were answered. The patient/family knows to call the clinic with any problems, questions or concerns.    Cammie Sickle, MD 11/29/2020 9:17 AM

## 2020-11-29 NOTE — Progress Notes (Signed)
Nutrition Follow-up:  Patient with triple negative breast cancer.  Patient s/p mastectomy.  Patient has completed adriamycin/cytoxan.  Starting taxol today.    Met with patient during infusion.  Patient reports that her appetite is not that good. Still tries to eat.  Breakfast is usually granola. Yesterday for lunch ate fish sticks and vegetable blend.  Supper last night was slice of pizza. Later ate yogurt with fruit. Drinks chocolate milk about 1 time per day and crystal light lemonade.  Snacking on granola and almonds.  Tired the shakes but really sweet, likes vanilla flavor the best.     Medications: reviewed  Labs: reviewed  Anthropometrics:   Weight 100 lb 9.6 oz stable  100 lb on 2/14 102 lb on 12/17 104 lb on 12/6    NUTRITION DIAGNOSIS: Inadequate oral intake continues   INTERVENTION:  Discussed ways to add calories and protein in diet. Encouraged increasing chocolate milk more than 1 time per day for added nutrition Shakes as tolerated.     MONITORING, EVALUATION, GOAL: weight trends, intake   NEXT VISIT: April 4th during infusion  Joli B. Zenia Resides, [place], [place] Registered Dietitian [phone] (mobile)

## 2020-11-29 NOTE — Assessment & Plan Note (Addendum)
#   Stage I triple negative breast cancer on adjuvant AC- Taxol chemo.  Tolerating chemotherapy with moderate side effects [neutropenic fever see below]. S/p 4 cycles of AC- START Taxol weekly.  # proceed with Taxol weekly [at 60 mg/m2-see below]; Labs today reviewed;  acceptable for treatment today.  Again reviewed with the patient the concerns of neuropathy with Taxol.  We will reduce the dose.  She understands that we will have to balance between continuing risk of recurrence versus potentially debilitating side effects of neuropathy.  Will monitor closely.  # Small fiber neuropathy- [Dr.Shah]-on Cymbalta/Lyrica-stable.  Low-dose of Taxol  # Fertility preserving- zoladex monthly plan 3/14]; hot flashes stable.  # DISPOSITION:  # chemo today;  # 1 week- labs- cbc/cmp; Taxol weekly; Zoladex  # follow up in 2 weeks- MD; labs- cbc/cmp; Taxol;  -Dr.B

## 2020-12-06 ENCOUNTER — Inpatient Hospital Stay: Payer: BC Managed Care – PPO

## 2020-12-06 VITALS — BP 123/79 | HR 82 | Temp 97.6°F | Resp 18

## 2020-12-06 DIAGNOSIS — C50411 Malignant neoplasm of upper-outer quadrant of right female breast: Secondary | ICD-10-CM | POA: Diagnosis not present

## 2020-12-06 DIAGNOSIS — Z171 Estrogen receptor negative status [ER-]: Secondary | ICD-10-CM

## 2020-12-06 LAB — CBC WITH DIFFERENTIAL/PLATELET
Abs Immature Granulocytes: 0.02 10*3/uL (ref 0.00–0.07)
Basophils Absolute: 0 10*3/uL (ref 0.0–0.1)
Basophils Relative: 1 %
Eosinophils Absolute: 0.1 10*3/uL (ref 0.0–0.5)
Eosinophils Relative: 4 %
HCT: 27.3 % — ABNORMAL LOW (ref 36.0–46.0)
Hemoglobin: 8.8 g/dL — ABNORMAL LOW (ref 12.0–15.0)
Immature Granulocytes: 1 %
Lymphocytes Relative: 19 %
Lymphs Abs: 0.6 10*3/uL — ABNORMAL LOW (ref 0.7–4.0)
MCH: 30.8 pg (ref 26.0–34.0)
MCHC: 32.2 g/dL (ref 30.0–36.0)
MCV: 95.5 fL (ref 80.0–100.0)
Monocytes Absolute: 0.5 10*3/uL (ref 0.1–1.0)
Monocytes Relative: 15 %
Neutro Abs: 1.8 10*3/uL (ref 1.7–7.7)
Neutrophils Relative %: 60 %
Platelets: 322 10*3/uL (ref 150–400)
RBC: 2.86 MIL/uL — ABNORMAL LOW (ref 3.87–5.11)
RDW: 16 % — ABNORMAL HIGH (ref 11.5–15.5)
WBC: 3 10*3/uL — ABNORMAL LOW (ref 4.0–10.5)
nRBC: 0 % (ref 0.0–0.2)

## 2020-12-06 LAB — COMPREHENSIVE METABOLIC PANEL
ALT: 27 U/L (ref 0–44)
AST: 31 U/L (ref 15–41)
Albumin: 3.9 g/dL (ref 3.5–5.0)
Alkaline Phosphatase: 54 U/L (ref 38–126)
Anion gap: 7 (ref 5–15)
BUN: 25 mg/dL — ABNORMAL HIGH (ref 6–20)
CO2: 26 mmol/L (ref 22–32)
Calcium: 9 mg/dL (ref 8.9–10.3)
Chloride: 104 mmol/L (ref 98–111)
Creatinine, Ser: 0.71 mg/dL (ref 0.44–1.00)
GFR, Estimated: >60 mL/min (ref >60)
Glucose, Bld: 129 mg/dL — ABNORMAL HIGH (ref 70–99)
Potassium: 3.8 mmol/L (ref 3.5–5.1)
Sodium: 137 mmol/L (ref 135–145)
Total Bilirubin: 0.4 mg/dL (ref 0.3–1.2)
Total Protein: 6.9 g/dL (ref 6.5–8.1)

## 2020-12-06 MED ORDER — FAMOTIDINE IN NACL 20-0.9 MG/50ML-% IV SOLN
20.0000 mg | Freq: Once | INTRAVENOUS | Status: AC
  Administered 2020-12-06: 20 mg via INTRAVENOUS
  Filled 2020-12-06: qty 50

## 2020-12-06 MED ORDER — HEPARIN SOD (PORK) LOCK FLUSH 100 UNIT/ML IV SOLN
500.0000 [IU] | Freq: Once | INTRAVENOUS | Status: AC | PRN
  Administered 2020-12-06: 500 [IU]
  Filled 2020-12-06: qty 5

## 2020-12-06 MED ORDER — HEPARIN SOD (PORK) LOCK FLUSH 100 UNIT/ML IV SOLN
INTRAVENOUS | Status: AC
  Filled 2020-12-06: qty 5

## 2020-12-06 MED ORDER — SODIUM CHLORIDE 0.9 % IV SOLN
20.0000 mg | Freq: Once | INTRAVENOUS | Status: AC
  Administered 2020-12-06: 20 mg via INTRAVENOUS
  Filled 2020-12-06: qty 20

## 2020-12-06 MED ORDER — GOSERELIN ACETATE 3.6 MG ~~LOC~~ IMPL
3.6000 mg | DRUG_IMPLANT | Freq: Once | SUBCUTANEOUS | Status: AC
  Administered 2020-12-06: 3.6 mg via SUBCUTANEOUS
  Filled 2020-12-06: qty 3.6

## 2020-12-06 MED ORDER — DIPHENHYDRAMINE HCL 50 MG/ML IJ SOLN
50.0000 mg | Freq: Once | INTRAMUSCULAR | Status: AC
  Administered 2020-12-06: 50 mg via INTRAVENOUS
  Filled 2020-12-06: qty 1

## 2020-12-06 MED ORDER — SODIUM CHLORIDE 0.9 % IV SOLN
Freq: Once | INTRAVENOUS | Status: AC
  Filled 2020-12-06: qty 250

## 2020-12-06 MED ORDER — SODIUM CHLORIDE 0.9 % IV SOLN
60.0000 mg/m2 | Freq: Once | INTRAVENOUS | Status: AC
  Administered 2020-12-06: 84 mg via INTRAVENOUS
  Filled 2020-12-06: qty 14

## 2020-12-09 ENCOUNTER — Telehealth: Payer: Self-pay

## 2020-12-09 NOTE — Telephone Encounter (Signed)
Called to confirm appt and to review pp, No answer, left message to call us back .

## 2020-12-10 ENCOUNTER — Ambulatory Visit (INDEPENDENT_AMBULATORY_CARE_PROVIDER_SITE_OTHER): Payer: BC Managed Care – PPO | Admitting: Surgical

## 2020-12-10 ENCOUNTER — Encounter: Payer: Self-pay | Admitting: Student in an Organized Health Care Education/Training Program

## 2020-12-10 ENCOUNTER — Other Ambulatory Visit: Payer: Self-pay

## 2020-12-10 ENCOUNTER — Encounter: Payer: Self-pay | Admitting: Surgical

## 2020-12-10 VITALS — BP 112/73 | HR 109

## 2020-12-10 DIAGNOSIS — C50411 Malignant neoplasm of upper-outer quadrant of right female breast: Secondary | ICD-10-CM

## 2020-12-10 DIAGNOSIS — Z171 Estrogen receptor negative status [ER-]: Secondary | ICD-10-CM

## 2020-12-10 DIAGNOSIS — Z9011 Acquired absence of right breast and nipple: Secondary | ICD-10-CM

## 2020-12-10 NOTE — Progress Notes (Signed)
   Referring Provider Shari Prows, [facility] [address],  [place] [address]   CC: No chief complaint on file.     Beth Wells is an 45 y.o. female.  HPI: Patient is a 45 year old female here for follow-up on her right breast reconstruction.  She underwent right total mastectomy by general surgery at [facility] followed by right breast reconstruction with placement of tissue expander and Flex HD with Dr. Marla Roe on 08/05/2020.  She is currently undergoing chemotherapy, she is scheduled to complete this on Feb 14, 2021. She reports she is doing a lot better with this chemo than the previous one.  Patient was last seen in the office on 10/22/2020, at that time she reported that she was comfortable with the size of the right breast expander and would like to move forward towards exchanging the expander for an implant.  She currently has 240 cc out of 350 cc expander in the right breast.  She reports that she is having a lot of difficulty with her social life.  She reports a lot of her friends work in the nursing home and therefore she is unable to spend the time with them due to the risk of exposure and her neutropenia.   Physical Exam Vitals with BMI 12/06/2020 11/29/2020 11/29/2020  Height - - -  Weight 101 lbs - -  BMI 63.01 - -  Systolic 601 093 235  Diastolic 79 86 90  Pulse 82 82 83    General:  No acute distress,  Alert and oriented, Non-Toxic, Normal speech and affect Right breast: Right breast incision is well-healed, she does have some contour changes of the right breast from scarring.  The right breast expander overall has a good shape, she is comfortable with the current size.  Assessment/Plan  45 year old female currently undergoing right breast reconstruction after right mastectomy.  She currently has an expander in place.  She is currently undergoing chemotherapy, she has 2 more months of treatment. She is interested in exchanging the  breast expander for an implant as soon as possible after completion of chemotherapy.  She reports her deductible for insurance would reset March 25, 2021.  She is hopeful that we would be able to exchange prior to this.  I did discuss with her that we wait longer than 4 weeks prior to exchange, however I would discuss this with Dr. Marla Roe to see what our options were.  I did discuss with her that exchanging sooner than recommended would put her at an increased risk of loss of the expander/implant.  We discussed support services provided in the area for assistance with cancer patients, specifically breast cancer patients as well.  I discussed with her that if she has any questions in regards to this the please let us know.  I did provide her with a printout of some local services for counseling, social work, other breast cancer support services.  Pictures were obtained of the patient and placed in the chart with the patient's or guardian's permission.  We did not place any saline in the tissue expander today. She currently has 240/350 cc in her right expander.   Carola Rhine Candis Kabel 12/10/2020, 11:45 AM

## 2020-12-13 ENCOUNTER — Encounter: Payer: Self-pay | Admitting: Student in an Organized Health Care Education/Training Program

## 2020-12-13 ENCOUNTER — Inpatient Hospital Stay: Payer: BC Managed Care – PPO

## 2020-12-13 ENCOUNTER — Other Ambulatory Visit: Payer: Self-pay

## 2020-12-13 ENCOUNTER — Encounter: Payer: Self-pay | Admitting: Internal Medicine

## 2020-12-13 ENCOUNTER — Ambulatory Visit
Payer: BC Managed Care – PPO | Attending: Student in an Organized Health Care Education/Training Program | Admitting: Student in an Organized Health Care Education/Training Program

## 2020-12-13 ENCOUNTER — Inpatient Hospital Stay (HOSPITAL_BASED_OUTPATIENT_CLINIC_OR_DEPARTMENT_OTHER): Payer: BC Managed Care – PPO | Admitting: Internal Medicine

## 2020-12-13 DIAGNOSIS — C50411 Malignant neoplasm of upper-outer quadrant of right female breast: Secondary | ICD-10-CM

## 2020-12-13 DIAGNOSIS — Z171 Estrogen receptor negative status [ER-]: Secondary | ICD-10-CM | POA: Diagnosis not present

## 2020-12-13 DIAGNOSIS — G603 Idiopathic progressive neuropathy: Secondary | ICD-10-CM | POA: Diagnosis not present

## 2020-12-13 DIAGNOSIS — G629 Polyneuropathy, unspecified: Secondary | ICD-10-CM | POA: Diagnosis not present

## 2020-12-13 DIAGNOSIS — G894 Chronic pain syndrome: Secondary | ICD-10-CM | POA: Diagnosis not present

## 2020-12-13 DIAGNOSIS — Z5111 Encounter for antineoplastic chemotherapy: Secondary | ICD-10-CM

## 2020-12-13 LAB — CBC WITH DIFFERENTIAL/PLATELET
Abs Immature Granulocytes: 0.01 10*3/uL (ref 0.00–0.07)
Basophils Absolute: 0 10*3/uL (ref 0.0–0.1)
Basophils Relative: 1 %
Eosinophils Absolute: 0.3 10*3/uL (ref 0.0–0.5)
Eosinophils Relative: 14 %
HCT: 30.4 % — ABNORMAL LOW (ref 36.0–46.0)
Hemoglobin: 9.7 g/dL — ABNORMAL LOW (ref 12.0–15.0)
Immature Granulocytes: 0 %
Lymphocytes Relative: 22 %
Lymphs Abs: 0.5 10*3/uL — ABNORMAL LOW (ref 0.7–4.0)
MCH: 30.5 pg (ref 26.0–34.0)
MCHC: 31.9 g/dL (ref 30.0–36.0)
MCV: 95.6 fL (ref 80.0–100.0)
Monocytes Absolute: 0.4 10*3/uL (ref 0.1–1.0)
Monocytes Relative: 16 %
Neutro Abs: 1.1 10*3/uL — ABNORMAL LOW (ref 1.7–7.7)
Neutrophils Relative %: 47 %
Platelets: 301 10*3/uL (ref 150–400)
RBC: 3.18 MIL/uL — ABNORMAL LOW (ref 3.87–5.11)
RDW: 16 % — ABNORMAL HIGH (ref 11.5–15.5)
WBC: 2.4 10*3/uL — ABNORMAL LOW (ref 4.0–10.5)
nRBC: 0 % (ref 0.0–0.2)

## 2020-12-13 LAB — COMPREHENSIVE METABOLIC PANEL
ALT: 32 U/L (ref 0–44)
AST: 32 U/L (ref 15–41)
Albumin: 4 g/dL (ref 3.5–5.0)
Alkaline Phosphatase: 57 U/L (ref 38–126)
Anion gap: 10 (ref 5–15)
BUN: 23 mg/dL — ABNORMAL HIGH (ref 6–20)
CO2: 22 mmol/L (ref 22–32)
Calcium: 9.1 mg/dL (ref 8.9–10.3)
Chloride: 104 mmol/L (ref 98–111)
Creatinine, Ser: 0.78 mg/dL (ref 0.44–1.00)
GFR, Estimated: >60 mL/min (ref >60)
Glucose, Bld: 105 mg/dL — ABNORMAL HIGH (ref 70–99)
Potassium: 4 mmol/L (ref 3.5–5.1)
Sodium: 136 mmol/L (ref 135–145)
Total Bilirubin: 0.5 mg/dL (ref 0.3–1.2)
Total Protein: 7.1 g/dL (ref 6.5–8.1)

## 2020-12-13 MED ORDER — SODIUM CHLORIDE 0.9 % IV SOLN
20.0000 mg | Freq: Once | INTRAVENOUS | Status: AC
  Administered 2020-12-13: 20 mg via INTRAVENOUS
  Filled 2020-12-13: qty 20

## 2020-12-13 MED ORDER — SODIUM CHLORIDE 0.9 % IV SOLN
60.0000 mg/m2 | Freq: Once | INTRAVENOUS | Status: AC
  Administered 2020-12-13: 84 mg via INTRAVENOUS
  Filled 2020-12-13: qty 14

## 2020-12-13 MED ORDER — FAMOTIDINE 20 MG IN NS 100 ML IVPB
20.0000 mg | Freq: Two times a day (BID) | INTRAVENOUS | Status: DC
  Administered 2020-12-13: 20 mg via INTRAVENOUS
  Filled 2020-12-13: qty 20

## 2020-12-13 MED ORDER — DIPHENHYDRAMINE HCL 50 MG/ML IJ SOLN
50.0000 mg | Freq: Once | INTRAMUSCULAR | Status: AC
  Administered 2020-12-13: 50 mg via INTRAVENOUS
  Filled 2020-12-13: qty 1

## 2020-12-13 MED ORDER — SODIUM CHLORIDE 0.9% FLUSH
10.0000 mL | Freq: Once | INTRAVENOUS | Status: AC
  Administered 2020-12-13: 10 mL via INTRAVENOUS
  Filled 2020-12-13: qty 10

## 2020-12-13 MED ORDER — HEPARIN SOD (PORK) LOCK FLUSH 100 UNIT/ML IV SOLN
500.0000 [IU] | Freq: Once | INTRAVENOUS | Status: AC
  Administered 2020-12-13: 500 [IU] via INTRAVENOUS
  Filled 2020-12-13: qty 5

## 2020-12-13 MED ORDER — SODIUM CHLORIDE 0.9 % IV SOLN
Freq: Once | INTRAVENOUS | Status: AC
  Filled 2020-12-13: qty 250

## 2020-12-13 MED ORDER — TRAMADOL HCL 50 MG PO TABS: 50.0000 mg | ORAL_TABLET | Freq: Two times a day (BID) | ORAL | 0 refills | Status: DC | PRN

## 2020-12-13 MED ORDER — FAMOTIDINE IN NACL 20-0.9 MG/50ML-% IV SOLN: 20.0000 mg | Freq: Once | INTRAVENOUS | Status: DC

## 2020-12-13 MED ORDER — HEPARIN SOD (PORK) LOCK FLUSH 100 UNIT/ML IV SOLN
INTRAVENOUS | Status: AC
  Filled 2020-12-13: qty 5

## 2020-12-13 NOTE — Progress Notes (Signed)
ANC: 1100. MD, Dr. Rogue Bussing, notified and already aware. Per MD order: proceed with scheduled Taxol treatment today.

## 2020-12-13 NOTE — Progress Notes (Signed)
one [place] NOTE  Patient Care Team: [place], [name], [name]  CHIEF COMPLAINTS/PURPOSE OF CONSULTATION: Breast cancer  #  Oncology History Overview Note  # OCT 2021-RIGHT BREAST- TRIPLE NEGATIVE [place]; [US/mammo-78m]; Dr.Cintron; OCT 2021-USThere is a 7 mm mass in the right breast at 10 o'clock, favored to represent a complicated cyst;  No evidence of right axillary lymphadenopathy].   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 [Dr.Shah]- skin biopsy- on cymblata+ Lyrica  # DEC 2021-status post evaluation with reproductive endocrinology [9-10% chance of viable pregnancy] # # SURVIVORSHIP:   # GENETICS: BARD  DIAGNOSIS: Breast cancer-triple negative  STAGE:  I       ;  GOALS: cure  CURRENT/MOST RECENT THERAPY : AC-T    Carcinoma of upper-outer quadrant of right breast in female, estrogen receptor negative ([place] 07/13/2020 Initial Diagnosis   Carcinoma of upper-outer quadrant of right breast in female, estrogen receptor negative ([facility]   Genetic Testing   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 The report date is 08/02/2020.  The Multi-Cancer Panel offered by [facility] includes sequencing and/or deletion duplication testing of the following 85 genes: AIP, ALK, APC, ATM, AXIN2,BAP1,  BARD1, BLM, BMPR1A, BRCA1, BRCA2, BRIP1, CASR, CDC73, CDH1, CDK4, CDKN1B, CDKN1C, CDKN2A (p14ARF), CDKN2A (p16INK4a), CEBPA, CHEK2, CTNNA1, DICER1, DIS3L2, EGFR (c.2369C>T, p.Thr790Met variant only), EPCAM (Deletion/duplication testing only), FH, FLCN, GATA2, GPC3, GREM1 (Promoter region deletion/duplication testing only), HOXB13 (c.251G>A, p.Gly84Glu), HRAS, KIT, MAX, MEN1, MET, MITF (c.952G>A, p.Glu318Lys variant  only), MLH1, MSH2, MSH3, MSH6, MUTYH, NBN, NF1, NF2, NTHL1, PALB2, PDGFRA, PHOX2B, PMS2, POLD1, POLE, POT1, PRKAR1A, PTCH1, PTEN, RAD50, RAD51C, RAD51D, RB1, RECQL4, RET, RNF43, RUNX1, SDHAF2, SDHA (sequence changes only), SDHB, SDHC, SDHD, SMAD4, SMARCA4, SMARCB1, SMARCE1, STK11, SUFU, TERC, TERT, TMEM127, TP53, TSC1, TSC2, VHL, WRN and WT1.    09/10/2020 -  Chemotherapy    Patient is on Treatment Plan: BREAST ADJUVANT DOSE DENSE AC Q14D / PACLITAXEL Q7D         HISTORY OF PRESENTING ILLNESS:  Beth Dewalt89.o.  female   with stage I triple negative breast cancer currently currently on adjuvant Taxol is here for follow-up.  Patient complains of mild worsening of her neuropathic pain at random places on her body [chest extremities]-also noted some tingling in the hand and feet.  Otherwise no nail changes.  No skin rash.  No constipation or diarrhea.  Review of Systems  Constitutional: Positive for malaise/fatigue. Negative for chills, diaphoresis, fever and weight loss.  HENT: Negative for nosebleeds and sore throat.   Eyes: Negative for double vision.  Respiratory: Negative for cough, hemoptysis, sputum production, shortness of breath and wheezing.   Cardiovascular: Negative for chest pain, palpitations, orthopnea and leg swelling.  Gastrointestinal: Negative for abdominal pain, blood in stool, diarrhea, heartburn, melena and vomiting.  Genitourinary: Negative for dysuria, frequency and urgency.  Musculoskeletal: Negative for back pain and joint pain.  Skin: Negative.  Negative for itching and rash.  Neurological: Positive for tingling. Negative for dizziness, focal weakness, weakness and headaches.  Endo/Heme/Allergies: Does not bruise/bleed easily.  Psychiatric/Behavioral: Negative for depression. The patient is not nervous/anxious and does not have insomnia.      MEDICAL HISTORY:  Past Medical History:  Diagnosis  Date   Breast cancer ([place]) 08/05/2020   Carcinoma of  upper-outer quadrant of right breast in female, estrogen receptor negative ([place]) 37/06/6268   Complication of anesthesia    hard to wake up-very dizzy feeling like she was going to pass out   GERD (gastroesophageal reflux disease)    no meds   H/O foot surgery 08/2018   History of kidney stones    h/o   Hypertension    PONV (postoperative nausea and vomiting)    n/v aftter kidney stone surgery    SURGICAL HISTORY: Past Surgical History:  Procedure Laterality Date   BREAST BIOPSY Right 07/06/2020   u/s bx Q clip path pending   BREAST RECONSTRUCTION WITH PLACEMENT OF TISSUE EXPANDER AND FLEX HD (ACELLULAR HYDRATED DERMIS) Right 08/05/2020   Procedure: RIGHT BREAST RECONSTRUCTION WITH PLACEMENT OF TISSUE EXPANDER AND FLEX HD (ACELLULAR HYDRATED DERMIS);  Surgeon: Wallace Going, DO;  Location: [facility];  Service: Plastics;  Laterality: Right;   BUNIONECTOMY Right    titanium pin   CERVICAL CONE BIOPSY     DILATION AND EVACUATION N/A 03/12/2018   Procedure: DILATATION AND EVACUATION;  Surgeon: [name], MD;  Location: [facility];  Service: Gynecology;  Laterality: N/A;   KIDNEY STONE SURGERY     LAPAROSCOPIC OVARIAN CYSTECTOMY Right 06/07/2019   Procedure: LAPAROSCOPIC OVARIAN CYSTECTOMY;  Surgeon: [name], MD;  Location: [facility];  Service: Gynecology;  Laterality: Right;   LAPAROSCOPY N/A 06/07/2019   Procedure: LAPAROSCOPY Luvenia Redden BIOPSIES;  Surgeon: [name], MD;  Location: [facility];  Service: Gynecology;  Laterality: N/A;   PORTACATH PLACEMENT Left 08/05/2020   Procedure: INSERTION PORT-A-CATH;  Surgeon: Herbert Pun, MD;  Location: [facility];  Service: General;  Laterality: Left;   TOTAL MASTECTOMY Right 08/05/2020   Procedure: TOTAL MASTECTOMY w/ Sentinel Node;  Surgeon: Herbert Pun, MD;  Location: [facility];  Service: General;  Laterality: Right;    SOCIAL HISTORY: Social History   Socioeconomic  History   Marital status: Married    Spouse name: Doug   Number of children: 0   Years of education: Not on file   Highest education level: Not on file  Occupational History   Occupation: Market researcher: [employer]  Tobacco Use   Smoking status: Never Smoker   Smokeless tobacco: Never Used  Vaping Use   Vaping Use: Never used  Substance and Sexual Activity   Alcohol use: Not Currently   Drug use: Never   Sexual activity: Yes    Birth control/protection: Condom  Other Topics Concern   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 Lives in [place].    Social Determinants of Health   Financial Resource Strain: Low Risk    Difficulty of Paying Living Expenses: Not hard at all  Food Insecurity: Not on file  Transportation Needs: No Transportation Needs   Lack of Transportation (Medical): No   Lack of Transportation (Non-Medical): No  Physical Activity: Not on file  Stress: Stress Concern Present   Feeling of Stress : Very much  Social Connections: Not on file  Intimate Partner Violence: Not on file    FAMILY HISTORY: Family History  Problem Relation Age of Onset   Cancer Other        great aunt- GI cancer   Breast cancer Neg Hx     ALLERGIES:  has No Known Allergies.  MEDICATIONS:  Current  Outpatient Medications  Medication Sig Dispense Refill   acetaminophen (TYLENOL) 500 MG tablet Take 1 tablet (500 mg total) by mouth every 6 (six) hours as needed. For use AFTER surgery 30 tablet 0   ALPHA-LIPOIC ACID PO Take 500 mg by mouth daily.      amphetamine-dextroamphetamine (ADDERALL XR) 30 MG 24 hr capsule Take 30 mg by mouth daily.      DPH-Lido-AlHydr-MgHydr-Simeth (FIRST-MOUTHWASH BLM) SUSP      DULoxetine (CYMBALTA) 20 MG capsule Take 2 capsules (40 mg total) by mouth at bedtime. (Patient taking differently: Take 20 mg by mouth at  bedtime.) 60 capsule 2   lidocaine-prilocaine (EMLA) cream Apply 1 application topically as needed. On the port generously; 30-45 mins prior to chemo appt. 30 g 3   lisinopril (ZESTRIL) 5 MG tablet Take 5 mg by mouth every evening.     magic mouthwash w/lidocaine SOLN Take 5 mLs by mouth. 80 ml viscous lidocaine 2%, 80 ml Mylanta, 80 ml Diphenhydramine 12.5 mg/5 ml Elixir, 80 ml Nystatin 100,000 Unit suspension, 80 ml Prednisolone 15 mg/67m, 80 ml Distilled Water. Sig: Swish/Swallow 5-10 ml four times a day as needed. Dispense 480 ml. 3RFs     ondansetron (ZOFRAN) 8 MG tablet One pill every 8 hours as needed for nausea/vomitting. 40 tablet 3   pregabalin (LYRICA) 100 MG capsule Take 1 capsule (100 mg total) by mouth 2 (two) times daily. 180 capsule 1   Probiotic Product (PROBIOTIC ADVANCED PO) Take 1 capsule by mouth daily.      prochlorperazine (COMPAZINE) 10 MG tablet Take 1 tablet (10 mg total) by mouth every 6 (six) hours as needed for nausea or vomiting. 40 tablet 3   sodium fluoride (FLUORISHIELD) 1.1 % GEL dental gel Place 1 application onto teeth 2 (two) times daily.     SODIUM FLUORIDE 5000 PPM 1.1 % PSTE Take by mouth.     trolamine salicylate (ASPERCREME) 10 % cream Apply 1 application topically as needed for muscle pain.     No current facility-administered medications for this visit.   Facility-Administered Medications Ordered in Other Visits  Medication Dose Route Frequency Provider Last Rate Last Admin   famotidine (PEPCID) IVPB 20 mg in NS 100 mL IVPB  20 mg Intravenous Q12H BCammie Sickle MD   Stopped at 12/13/20 1110      .  PHYSICAL EXAMINATION: ECOG PERFORMANCE STATUS: 0 - Asymptomatic  Vitals:   12/13/20 0844  BP: 123/90  Pulse: 87  Resp: 16  Temp: (!) 97.1 F (36.2 C)  SpO2: 100%   Filed Weights   12/13/20 0844  Weight: 99 lb 8 oz (45.1 kg)    Physical Exam Constitutional:      Comments: Alone; ambulating independently.   HENT:      Head: Normocephalic and atraumatic.     Mouth/Throat:     Pharynx: No oropharyngeal exudate.  Eyes:     Pupils: Pupils are equal, round, and reactive to light.  Cardiovascular:     Rate and Rhythm: Normal rate and regular rhythm.  Pulmonary:     Effort: Pulmonary effort is normal. No respiratory distress.     Breath sounds: Normal breath sounds. No wheezing.  Abdominal:     General: Bowel sounds are normal. There is no distension.     Palpations: Abdomen is soft. There is no mass.     Tenderness: There is no abdominal tenderness. There is no guarding or rebound.  Musculoskeletal:  General: No tenderness. Normal range of motion.     Cervical back: Normal range of motion and neck supple.  Skin:    General: Skin is warm.  Neurological:     Mental Status: She is alert and oriented to person, place, and time.  Psychiatric:        Mood and Affect: Affect normal.      LABORATORY DATA:  I have reviewed the data as listed Lab Results  Component Value Date   WBC 2.4 (L) 12/13/2020   HGB 9.7 (L) 12/13/2020   HCT 30.4 (L) 12/13/2020   MCV 95.6 12/13/2020   PLT 301 12/13/2020   Recent Labs    11/29/20 0808 12/06/20 0837 12/13/20 0834  NA 136 137 136  K 3.6 3.8 4.0  CL 102 104 104  CO2 '24 26 22  ' GLUCOSE 103* 129* 105*  BUN 24* 25* 23*  CREATININE 0.62 0.71 0.78  CALCIUM 9.2 9.0 9.1  GFRNONAA >60 >60 >60  PROT 7.3 6.9 7.1  ALBUMIN 3.8 3.9 4.0  AST 19 31 32  ALT 10 27 32  ALKPHOS 63 54 57  BILITOT 0.7 0.4 0.5    RADIOGRAPHIC STUDIES: I have personally reviewed the radiological images as listed and agreed with the findings in the report. No results found.  ASSESSMENT & PLAN:   Carcinoma of upper-outer quadrant of right breast in female, estrogen receptor negative ([place]) # Stage I triple negative breast cancer on adjuvant AC- Taxol chemo.  Tolerating chemotherapy with moderate side effects [small fibre neuropathy-se below S/p 4 cycles of AC; currently on Taxol-3  weeks on 1 week off [sec to PN].   #Proceed with Taxol today [60 mg/m2] Labs today reviewed;  acceptable for treatment today. Tolerating with mild-moderate worse- PN see below.plan 3 w-ON and 1 week OFF  ANC 1.1-; if worse would recommend GCSF.   # Small fiber neuropathy- [Dr.Shah]-on Cymbalta/Lyrica-WORSE; Lower-dose of Taxol; and also 3 weeks on 1 week off;  pain clinic appt with Dr.Lateef  # Fertility preserving- zoladex monthly plan 3/14]; hot flashes stable.  #Long discussion with the patient regarding pros and cons of continued chemotherapy.  Discussed that the absolute benefit from weekly Taxol is lower compared to Adriamycin Cytoxan chemotherapy.  If patient has continued worsening neuropathy-I think it reasonable to discontinue as unfortunately Taxol neuropathy might have lifelong consequences.  Understandably patient is nervous about recurrent malignancy, however thankfully the chance of recurrence is quite small-given stage I cancer.  We will continue Taxol for now.   # DISPOSITION:  # chemo today;  # follow up in 2 weeks- MD; labs- cbc/cmp; Taxol;  -Dr.B   All questions were answered. The patient/family knows to call the clinic with any problems, questions or concerns.    Cammie Sickle, MD 12/13/2020 1:55 PM

## 2020-12-13 NOTE — Assessment & Plan Note (Addendum)
#   Stage I triple negative breast cancer on adjuvant AC- Taxol chemo.  Tolerating chemotherapy with moderate side effects [small fibre neuropathy-se below S/p 4 cycles of AC; currently on Taxol-3 weeks on 1 week off [sec to PN].   #Proceed with Taxol today [60 mg/m2] Labs today reviewed;  acceptable for treatment today. Tolerating with mild-moderate worse- PN see below.plan 3 w-ON and 1 week OFF  ANC 1.1-; if worse would recommend GCSF.   # Small fiber neuropathy- [Dr.Shah]-on Cymbalta/Lyrica-WORSE; Lower-dose of Taxol; and also 3 weeks on 1 week off;  pain clinic appt with Dr.Lateef  # Fertility preserving- zoladex monthly plan 3/14]; hot flashes stable.  #Long discussion with the patient regarding pros and cons of continued chemotherapy.  Discussed that the absolute benefit from weekly Taxol is lower compared to Adriamycin Cytoxan chemotherapy.  If patient has continued worsening neuropathy-I think it reasonable to discontinue as unfortunately Taxol neuropathy might have lifelong consequences.  Understandably patient is nervous about recurrent malignancy, however thankfully the chance of recurrence is quite small-given stage I cancer.  We will continue Taxol for now.   # DISPOSITION:  # chemo today;  # follow up in 2 weeks- MD; labs- cbc/cmp; Taxol;  -Dr.B

## 2020-12-13 NOTE — Progress Notes (Signed)
Patient: Beth Wells  Service Category: E/M  Provider: Gillis Santa, MD  DOB: 1976/03/31  DOS: 12/13/2020  Location: Office  MRN: [id number]  Setting: Ambulatory outpatient  Referring Provider: [name]  Type: Established Patient  Specialty: Interventional Pain Management  PCP: [name]  Location: Home  Delivery: TeleHealth     Virtual Encounter - Pain Management PROVIDER NOTE: Information contained herein reflects review and annotations entered in association with encounter. Interpretation of such information and data should be left to medically-trained personnel. Information provided to patient can be located elsewhere in the medical record under "Patient Instructions". Document created using STT-dictation technology, any transcriptional errors that may result from process are unintentional.    Contact & Pharmacy Preferred: [phone] Home: [phone] (home) Mobile: [phone] (mobile) E-mail: [email or web address]  [id number] [place] [address]Phone: [phone]: [phone]  Pre-screening  [name] offered "in-person" vs "virtual" encounter. She indicated preferring virtual for this encounter.   Reason COVID-19*  Social distancing based on CDC and AMA recommendations.   I contacted KDwayna Kentneron 12/13/2020 via video conference.      I clearly identified myself as BGillis Santa MD. I verified that I was speaking with the correct person using two identifiers (Name: KSummer Wells and date of birth: 7November 03, 1977.  Consent I sought verbal advanced consent from KBabette Relicfor virtual visit interactions. I informed [name] of possible security and privacy concerns, risks, and limitations associated with providing "not-in-person" medical evaluation and management services. I also informed [name] of the availability of "in-person" appointments. Finally, I informed her that there would be a  charge for the virtual visit and that she could be  personally, fully or partially, financially responsible for it. [name] understanding and agreed to proceed.   Historic Elements   Ms. KRayelle Armoris a 45y.o. year old, female patient evaluated today after our last contact on Visit date not found. [name] has a past medical history of Breast cancer ([place] (08/05/2020), Carcinoma of upper-outer quadrant of right breast in female, estrogen receptor negative ([place] (176/81/1572, Complication of anesthesia, GERD (gastroesophageal reflux disease), H/O foot surgery (08/2018), History of kidney stones, Hypertension, and PONV (postoperative nausea and vomiting). She also  has a past surgical history that includes Kidney stone surgery; Cervical cone biopsy; Dilation and evacuation (N/A, 03/12/2018); laparoscopy (N/A, 06/07/2019); Laparoscopic ovarian cystectomy (Right, 06/07/2019); Breast biopsy (Right, 07/06/2020); Bunionectomy (Right); Total mastectomy (Right, 08/05/2020); Portacath placement (Left, 08/05/2020); and Breast reconstruction with placement of tissue expander and flex hd (acellular hydrated dermis) (Right, 08/05/2020). Ms. LWilmarthhas a current medication list which includes the following prescription(s): acetaminophen, alpha-lipoic acid, amphetamine-dextroamphetamine, first-mouthwash blm, duloxetine, lidocaine-prilocaine, lisinopril, magic mouthwash w/lidocaine, ondansetron, pregabalin, probiotic product, prochlorperazine, sodium fluoride, sodium fluoride 5000 ppm, tramadol, and trolamine salicylate, and the following Facility-Administered Medications: famotidine. She  reports that she has never smoked. She has never used smokeless tobacco. She reports previous alcohol use. She reports that she does not use drugs. Ms. LUpshurhas No Known Allergies.   HPI  Today, she is being contacted for worsening of previously known (established) problem   Is doing chemotherapy for breast cancer, states  that she has noticed an increased in her neuropathic pain primarily in her legs (currently on Taxol). States that she is not having a Taxol infusion this week Is continuing Lyrica 100 mg twice a day, alpha lipoic acid 500 mg  daily, Cymbalta 20 mg daily.  Higher doses of Cymbalta resulted in cognitive side effects I do not recommend escalation of Cymbalta. We discussed monitoring her symptoms as this could be related to Taxol infusion.  I will send in a short prescription for tramadol as below.  We discussed a lidocaine infusion for neuropathic pain.  Recommend repeat EKG to evaluate QTC. If patient symptoms are not improved by next week and her QTC is less than 470 ms, can consider lidocaine infusion for neuropathic pain. Pharmacotherapy Assessment  Analgesic: Prescription for tramadol as below  Monitoring: [place] PMP: PDMP not reviewed this encounter.       Pharmacotherapy: No side-effects or adverse reactions reported. Compliance: No problems identified. Effectiveness: Clinically acceptable. Plan: Refer to "POC".  UDS: No results found for: SUMMARY  Laboratory Chemistry Profile   Renal Lab Results  Component Value Date   BUN 23 (H) 12/13/2020   CREATININE 0.78 12/13/2020   GFRAA >60 06/07/2019   GFRNONAA >60 12/13/2020     Hepatic Lab Results  Component Value Date   AST 32 12/13/2020   ALT 32 12/13/2020   ALBUMIN 4.0 12/13/2020   ALKPHOS 57 12/13/2020     Electrolytes Lab Results  Component Value Date   NA 136 12/13/2020   Beth 4.0 12/13/2020   CL 104 12/13/2020   CALCIUM 9.1 12/13/2020     Bone No results found for: VD25OH, [id number], EF0071QR9, XJ8832PQ9, 25OHVITD1, 25OHVITD2, 25OHVITD3, TESTOFREE, TESTOSTERONE   Inflammation (CRP: Acute Phase) (ESR: Chronic Phase) Lab Results  Component Value Date   LATICACIDVEN 1.4 10/18/2020       Note: Above Lab results reviewed.  Imaging  DG Chest 2 View CLINICAL DATA:  Breast cancer patient with fever today. Chills  and weakness. Recent mastectomy.  EXAM: CHEST - 2 VIEW  COMPARISON:  08/05/2020  FINDINGS: Hyperinflation of the lungs. Power port type left central venous catheter with tip over the low SVC region. No pneumothorax. Heart size and pulmonary vascularity are normal. Postoperative right mastectomy with breast reconstruction. Lungs are clear. No airspace disease, consolidation, or edema. No pleural effusions. No pneumothorax. Mediastinal contours appear intact.  IMPRESSION: No active cardiopulmonary disease.  Electronically Signed   By: Lucienne Capers M.D.   On: 10/18/2020 23:25  Assessment  The primary encounter diagnosis was Chronic pain syndrome. 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  Plan of Care   [name] has a current medication list which includes the following long-term medication(s): amphetamine-dextroamphetamine, duloxetine, lisinopril, pregabalin, and prochlorperazine.  Pharmacotherapy (Medications Ordered): Meds ordered this encounter  Medications  . traMADol (ULTRAM) 50 MG tablet    Sig: Take 1-2 tablets (50-100 mg total) by mouth 2 (two) times daily as needed for up to 15 days for severe pain.    Dispense:  30 tablet    Refill:  0    Fill one day early if pharmacy is closed on scheduled refill date.   Orders:  Orders Placed This Encounter  Procedures  . LIDOCAINE INFUSION    Scheduling: Patient will call when needed. Note: Tuesdays or Thursdays @@ 11:00 AM.    Standing Status:   Standing    Number of Occurrences:   18    Standing Expiration Date:   06/15/2021    Scheduling Instructions:     Sedation: IV access required     Schedule: PRN, but no sooner than 30 days from last IV Lidocaine infusion.  NOTE: Continuous cardiovascular monitoring required (ECG, NIBPM, SpO2)    Order Specific Question:   Where will this procedure be performed?    Answer:   [facility]   . EKG 12-Lead    This ECG is being ordered as part of a work-up prior to high-risk medication prescribing.    Scheduling Instructions:     Please evaluate for QT/QTc prolongation, as well as 2nd degree AV blocks prior to high-risk medication therapy.   Follow-up plan:   Return if symptoms worsen or fail to improve.   Recent Visits No visits were found meeting these conditions. Showing recent visits within past 90 days and meeting all other requirements Today's Visits Date Type Provider Dept  12/13/20 Telemedicine Gillis Santa, MD [facility]  Showing today's visits and meeting all other requirements Future Appointments Date Type Provider Dept  01/06/21 Appointment Gillis Santa, MD [facility]  Showing future appointments within next 90 days and meeting all other requirements  I discussed the assessment and treatment plan with the patient. The patient was provided an opportunity to ask questions and all were answered. The patient agreed with the plan and demonstrated an understanding of the instructions.  Patient advised to call back or seek an in-person evaluation if the symptoms or condition worsens.  Duration of encounter:65mnutes.  Note by: BGillis Santa MD Date: 12/13/2020; Time: 2:35 PM

## 2020-12-13 NOTE — Progress Notes (Signed)
Having burning pain all over that has started back 2-3 days prior to taxol tx. Within the last week has gotten worse. Feeling in hands and wrist.    Acupuncture for the neuropathy.  Alternative to taxol

## 2020-12-14 ENCOUNTER — Other Ambulatory Visit: Payer: Self-pay

## 2020-12-14 ENCOUNTER — Encounter: Payer: Self-pay | Admitting: *Deleted

## 2020-12-14 ENCOUNTER — Ambulatory Visit
Admission: RE | Admit: 2020-12-14 | Discharge: 2020-12-14 | Disposition: A | Discharge status: Home / Self Care | Payer: BC Managed Care – PPO | Source: Ambulatory Visit | Attending: Student in an Organized Health Care Education/Training Program | Admitting: Student in an Organized Health Care Education/Training Program

## 2020-12-14 DIAGNOSIS — G894 Chronic pain syndrome: Secondary | ICD-10-CM | POA: Diagnosis not present

## 2020-12-14 NOTE — Progress Notes (Signed)
Received message from Dr. Rogue Bussing that patient was interested in acupuncture.  I have called patient and she would like to try our 6 free sessions for her neuropathy.  I have faxed approval form to [place] per her choice for 6 free sessions.

## 2020-12-20 ENCOUNTER — Telehealth: Payer: Self-pay | Admitting: Student in an Organized Health Care Education/Training Program

## 2020-12-20 ENCOUNTER — Other Ambulatory Visit: Payer: Self-pay | Admitting: Student in an Organized Health Care Education/Training Program

## 2020-12-20 DIAGNOSIS — G629 Polyneuropathy, unspecified: Secondary | ICD-10-CM

## 2020-12-20 DIAGNOSIS — G894 Chronic pain syndrome: Secondary | ICD-10-CM

## 2020-12-20 DIAGNOSIS — G603 Idiopathic progressive neuropathy: Secondary | ICD-10-CM

## 2020-12-20 MED ORDER — TRAMADOL HCL 50 MG PO TABS: 50.0000 mg | ORAL_TABLET | Freq: Two times a day (BID) | ORAL | 1 refills | Status: AC | PRN

## 2020-12-20 NOTE — Telephone Encounter (Signed)
Called patient and let her know that Dr. Holley Raring will send in refill of Tramadol. She is going to try acupuncture before committing to the Lidocaine infusion. She will let us know if she wants to proceed with it.

## 2020-12-20 NOTE — Telephone Encounter (Signed)
Patient is calling about Tramadol she is taking 2 in morning and 2 in evening. Seems to be helping a little. Needs a refill. She is going to get some accu -puncture from the cancer center as well, then will talk to Dr. Holley Raring about getting the lidocaine infusion Please call patient and let her know about the tramadol refill.

## 2020-12-20 NOTE — Progress Notes (Signed)
Requested Prescriptions   Signed Prescriptions Disp Refills  . traMADol (ULTRAM) 50 MG tablet 60 tablet 1    Sig: Take 1-2 tablets (50-100 mg total) by mouth 2 (two) times daily as needed for severe pain.    PMP checked and appropriate

## 2020-12-21 ENCOUNTER — Telehealth: Payer: Self-pay | Admitting: Student in an Organized Health Care Education/Training Program

## 2020-12-21 NOTE — Telephone Encounter (Signed)
Patient says the pharmacy will not fill her prescription for Tramadol until Saturday?  Please call the pharmacy and ok for patient to pick up today. Fill date looks to be 12-20-20. Please advise patient when she can pick up her meds.  Thank you

## 2020-12-21 NOTE — Telephone Encounter (Signed)
Pharmacy instructed script for Tramadol may be filled today.

## 2020-12-21 NOTE — Telephone Encounter (Signed)
Dr. Holley Raring gave permission to fill this yesterday.

## 2020-12-27 ENCOUNTER — Inpatient Hospital Stay (HOSPITAL_BASED_OUTPATIENT_CLINIC_OR_DEPARTMENT_OTHER): Payer: BC Managed Care – PPO | Admitting: Internal Medicine

## 2020-12-27 ENCOUNTER — Inpatient Hospital Stay: Payer: BC Managed Care – PPO | Attending: Internal Medicine

## 2020-12-27 ENCOUNTER — Encounter: Payer: Self-pay | Admitting: Internal Medicine

## 2020-12-27 ENCOUNTER — Other Ambulatory Visit: Payer: Self-pay

## 2020-12-27 ENCOUNTER — Inpatient Hospital Stay: Payer: BC Managed Care – PPO

## 2020-12-27 VITALS — BP 125/77 | HR 79 | Resp 16

## 2020-12-27 DIAGNOSIS — T451X5A Adverse effect of antineoplastic and immunosuppressive drugs, initial encounter: Secondary | ICD-10-CM | POA: Insufficient documentation

## 2020-12-27 DIAGNOSIS — R5383 Other fatigue: Secondary | ICD-10-CM | POA: Insufficient documentation

## 2020-12-27 DIAGNOSIS — G620 Drug-induced polyneuropathy: Secondary | ICD-10-CM | POA: Insufficient documentation

## 2020-12-27 DIAGNOSIS — R5381 Other malaise: Secondary | ICD-10-CM | POA: Diagnosis not present

## 2020-12-27 DIAGNOSIS — C50411 Malignant neoplasm of upper-outer quadrant of right female breast: Secondary | ICD-10-CM

## 2020-12-27 DIAGNOSIS — Z171 Estrogen receptor negative status [ER-]: Secondary | ICD-10-CM | POA: Insufficient documentation

## 2020-12-27 DIAGNOSIS — Z9011 Acquired absence of right breast and nipple: Secondary | ICD-10-CM | POA: Diagnosis not present

## 2020-12-27 DIAGNOSIS — R232 Flushing: Secondary | ICD-10-CM | POA: Diagnosis not present

## 2020-12-27 DIAGNOSIS — Z79899 Other long term (current) drug therapy: Secondary | ICD-10-CM | POA: Diagnosis not present

## 2020-12-27 DIAGNOSIS — Z5189 Encounter for other specified aftercare: Secondary | ICD-10-CM | POA: Diagnosis not present

## 2020-12-27 DIAGNOSIS — Z5111 Encounter for antineoplastic chemotherapy: Secondary | ICD-10-CM | POA: Insufficient documentation

## 2020-12-27 LAB — COMPREHENSIVE METABOLIC PANEL
ALT: 15 U/L (ref 0–44)
AST: 20 U/L (ref 15–41)
Albumin: 3.9 g/dL (ref 3.5–5.0)
Alkaline Phosphatase: 50 U/L (ref 38–126)
Anion gap: 8 (ref 5–15)
BUN: 20 mg/dL (ref 6–20)
CO2: 24 mmol/L (ref 22–32)
Calcium: 9.1 mg/dL (ref 8.9–10.3)
Chloride: 106 mmol/L (ref 98–111)
Creatinine, Ser: 0.75 mg/dL (ref 0.44–1.00)
GFR, Estimated: >60 mL/min (ref >60)
Glucose, Bld: 105 mg/dL — ABNORMAL HIGH (ref 70–99)
Potassium: 3.7 mmol/L (ref 3.5–5.1)
Sodium: 138 mmol/L (ref 135–145)
Total Bilirubin: 0.5 mg/dL (ref 0.3–1.2)
Total Protein: 6.9 g/dL (ref 6.5–8.1)

## 2020-12-27 LAB — CBC WITH DIFFERENTIAL/PLATELET
Abs Immature Granulocytes: 0.01 10*3/uL (ref 0.00–0.07)
Basophils Absolute: 0 10*3/uL (ref 0.0–0.1)
Basophils Relative: 1 %
Eosinophils Absolute: 0.4 10*3/uL (ref 0.0–0.5)
Eosinophils Relative: 15 %
HCT: 30.4 % — ABNORMAL LOW (ref 36.0–46.0)
Hemoglobin: 9.8 g/dL — ABNORMAL LOW (ref 12.0–15.0)
Immature Granulocytes: 0 %
Lymphocytes Relative: 26 %
Lymphs Abs: 0.6 10*3/uL — ABNORMAL LOW (ref 0.7–4.0)
MCH: 30.8 pg (ref 26.0–34.0)
MCHC: 32.2 g/dL (ref 30.0–36.0)
MCV: 95.6 fL (ref 80.0–100.0)
Monocytes Absolute: 0.2 10*3/uL (ref 0.1–1.0)
Monocytes Relative: 8 %
Neutro Abs: 1.2 10*3/uL — ABNORMAL LOW (ref 1.7–7.7)
Neutrophils Relative %: 50 %
Platelets: 270 10*3/uL (ref 150–400)
RBC: 3.18 MIL/uL — ABNORMAL LOW (ref 3.87–5.11)
RDW: 14.4 % (ref 11.5–15.5)
WBC: 2.4 10*3/uL — ABNORMAL LOW (ref 4.0–10.5)
nRBC: 0 % (ref 0.0–0.2)

## 2020-12-27 MED ORDER — HEPARIN SOD (PORK) LOCK FLUSH 100 UNIT/ML IV SOLN
INTRAVENOUS | Status: AC
  Filled 2020-12-27: qty 5

## 2020-12-27 MED ORDER — SODIUM CHLORIDE 0.9% FLUSH
10.0000 mL | Freq: Once | INTRAVENOUS | Status: AC
  Administered 2020-12-27: 10 mL via INTRAVENOUS
  Filled 2020-12-27: qty 10

## 2020-12-27 MED ORDER — FAMOTIDINE 20 MG IN NS 100 ML IVPB
20.0000 mg | Freq: Once | INTRAVENOUS | Status: AC
  Administered 2020-12-27: 20 mg via INTRAVENOUS
  Filled 2020-12-27: qty 20

## 2020-12-27 MED ORDER — HEPARIN SOD (PORK) LOCK FLUSH 100 UNIT/ML IV SOLN
500.0000 [IU] | Freq: Once | INTRAVENOUS | Status: AC
  Administered 2020-12-27: 500 [IU] via INTRAVENOUS
  Filled 2020-12-27: qty 5

## 2020-12-27 MED ORDER — DIPHENHYDRAMINE HCL 50 MG/ML IJ SOLN
50.0000 mg | Freq: Once | INTRAMUSCULAR | Status: AC
  Administered 2020-12-27: 50 mg via INTRAVENOUS
  Filled 2020-12-27: qty 1

## 2020-12-27 MED ORDER — SODIUM CHLORIDE 0.9 % IV SOLN
20.0000 mg | Freq: Once | INTRAVENOUS | Status: AC
Start: 2020-12-27 — End: 2020-12-27
  Administered 2020-12-27: 20 mg via INTRAVENOUS
  Filled 2020-12-27: qty 20

## 2020-12-27 MED ORDER — SODIUM CHLORIDE 0.9 % IV SOLN
60.0000 mg/m2 | Freq: Once | INTRAVENOUS | Status: AC
  Administered 2020-12-27: 84 mg via INTRAVENOUS
  Filled 2020-12-27: qty 14

## 2020-12-27 MED ORDER — SODIUM CHLORIDE 0.9 % IV SOLN
Freq: Once | INTRAVENOUS | Status: AC
  Filled 2020-12-27: qty 250

## 2020-12-27 NOTE — Assessment & Plan Note (Signed)
#   Stage I triple negative breast cancer on adjuvant AC- Taxol chemo.  Tolerating chemotherapy with moderate side effects [small fibre neuropathy-se below S/p 4 cycles of AC; currently on Taxol-3 weeks on 1 week off [sec to PN].   #Proceed with Taxol today [60 mg/m2] Labs today reviewed;  acceptable for treatment today. Tolerating with mild-moderate worse- PN see below.plan 3 w-ON and 1 week OFF  ANC 1.2-; if worse would recommend GCSF.   # Small fiber neuropathy- [Dr.Shah]-on Cymbalta/Lyrica-WORSE; Lower-dose of Taxol; and also 3 weeks on 1 week off;  pain clinic-tramadol prn Dr.Lateef  # Fertility preserving- zoladex monthly plan 3/14]; hot flashes- STABLE.   # DISPOSITION:  # chemo today;  # in 1 week- labs- cbc/cmp;Taxol; Zoladex # follow up in 2 weeks- MD; labs- cbc/cmp; Taxol;  -Dr.B

## 2020-12-27 NOTE — Progress Notes (Signed)
[facility] NOTE  Patient Care Team: [place], [name], [name]  CHIEF COMPLAINTS/PURPOSE OF CONSULTATION: Breast cancer  #  Oncology History Overview Note  # OCT 2021-RIGHT BREAST- TRIPLE NEGATIVE [place]; [US/mammo-30m]; Dr.Cintron; OCT 2021-USThere is a 7 mm mass in the right breast at 10 o'clock, favored to represent a complicated cyst;  No evidence of right axillary lymphadenopathy].   # NOV 2021- Stage I pT1bp N0 [s/p mastectomy; Dr.Cintron]; G-3;   # DEC 17th, 2021- AC x4; 11/29/2020- Taxol weekly [662mm2-3 W-ON & 1 W-OFF sec to PN]  # 2018-Small Fibre neuropathy [Dr.Shah]- skin biopsy- on cymblata+ Lyrica  # DEC 2021-status post evaluation with reproductive endocrinology [9-10% chance of viable pregnancy] # # SURVIVORSHIP:   # GENETICS: BARD  DIAGNOSIS: Breast cancer-triple negative  STAGE:  I       ;  GOALS: cure  CURRENT/MOST RECENT THERAPY : AC-T    Carcinoma of upper-outer quadrant of right breast in female, estrogen receptor negative [place] [place] Initial Diagnosis   Carcinoma of upper-outer quadrant of right breast in female, estrogen receptor negative ([facility]   Genetic Testing   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 The report date is 08/02/2020.  The Multi-Cancer Panel offered by [facility] includes sequencing and/or deletion duplication testing of the following 85 genes: AIP, ALK, APC, ATM, AXIN2,BAP1,  BARD1, BLM, BMPR1A, BRCA1, BRCA2, BRIP1, CASR, CDC73, CDH1, CDK4, CDKN1B, CDKN1C, CDKN2A (p14ARF), CDKN2A (p16INK4a), CEBPA, CHEK2, CTNNA1, DICER1, DIS3L2, EGFR (c.2369C>T, p.Thr790Met variant only), EPCAM (Deletion/duplication testing only), FH, FLCN, GATA2, GPC3, GREM1 (Promoter region deletion/duplication testing only), HOXB13 (c.251G>A, p.Gly84Glu), HRAS, KIT, MAX, MEN1, MET, MITF (c.952G>A, p.Glu318Lys variant  only), MLH1, MSH2, MSH3, MSH6, MUTYH, NBN, NF1, NF2, NTHL1, PALB2, PDGFRA, PHOX2B, PMS2, POLD1, POLE, POT1, PRKAR1A, PTCH1, PTEN, RAD50, RAD51C, RAD51D, RB1, RECQL4, RET, RNF43, RUNX1, SDHAF2, SDHA (sequence changes only), SDHB, SDHC, SDHD, SMAD4, SMARCA4, SMARCB1, SMARCE1, STK11, SUFU, TERC, TERT, TMEM127, TP53, TSC1, TSC2, VHL, WRN and WT1.    09/10/2020 -  Chemotherapy    Patient is on Treatment Plan: BREAST ADJUVANT DOSE DENSE AC Q14D / PACLITAXEL Q7D         HISTORY OF PRESENTING ILLNESS:  Beth Fialkowski89.o.  female   with stage I triple negative breast cancer currently currently on adjuvant Taxol is here for follow-up.  Continues to have mild tingling and numbness of her extremities.  She continues to have neuropathic pain at random places on her body.  She has been evaluated by pain clinic offered tramadol.  Also awaiting to start acupuncture.  Otherwise no constipation.  No skin rash from that.  No fevers or chills.  Review of Systems  Constitutional: Positive for malaise/fatigue. Negative for chills, diaphoresis, fever and weight loss.  HENT: Negative for nosebleeds and sore throat.   Eyes: Negative for double vision.  Respiratory: Negative for cough, hemoptysis, sputum production, shortness of breath and wheezing.   Cardiovascular: Negative for chest pain, palpitations, orthopnea and leg swelling.  Gastrointestinal: Negative for abdominal pain, blood in stool, diarrhea, heartburn, melena and vomiting.  Genitourinary: Negative for dysuria, frequency and urgency.  Musculoskeletal: Negative for back pain and joint pain.  Skin: Negative.  Negative for itching and rash.  Neurological: Positive for tingling. Negative for dizziness, focal weakness, weakness and headaches.  Endo/Heme/Allergies: Does not bruise/bleed easily.  Psychiatric/Behavioral: Negative for depression. The patient is not nervous/anxious and does not  have insomnia.      MEDICAL HISTORY:  Past Medical  History:  Diagnosis Date  . Breast cancer ([place]) 08/05/2020  . Carcinoma of upper-outer quadrant of right breast in female, estrogen receptor negative ([place]) 07/13/2020  . Complication of anesthesia    hard to wake up-very dizzy feeling like she was going to pass out  . GERD (gastroesophageal reflux disease)    no meds  . H/O foot surgery 08/2018  . History of kidney stones    h/o  . Hypertension   . PONV (postoperative nausea and vomiting)    n/v aftter kidney stone surgery    SURGICAL HISTORY: Past Surgical History:  Procedure Laterality Date  . BREAST BIOPSY Right 07/06/2020   u/s bx Q clip path pending  . BREAST RECONSTRUCTION WITH PLACEMENT OF TISSUE EXPANDER AND FLEX HD (ACELLULAR HYDRATED DERMIS) Right 08/05/2020   Procedure: RIGHT BREAST RECONSTRUCTION WITH PLACEMENT OF TISSUE EXPANDER AND FLEX HD (ACELLULAR HYDRATED DERMIS);  Surgeon: Wallace Going, DO;  Location: [facility];  Service: Plastics;  Laterality: Right;  . BUNIONECTOMY Right    titanium pin  . CERVICAL CONE BIOPSY    . DILATION AND EVACUATION N/A 03/12/2018   Procedure: DILATATION AND EVACUATION;  Surgeon: [name], MD;  Location: [facility];  Service: Gynecology;  Laterality: N/A;  . KIDNEY STONE SURGERY    . LAPAROSCOPIC OVARIAN CYSTECTOMY Right 06/07/2019   Procedure: LAPAROSCOPIC OVARIAN CYSTECTOMY;  Surgeon: [name], MD;  Location: [facility];  Service: Gynecology;  Laterality: Right;  . LAPAROSCOPY N/A 06/07/2019   Procedure: LAPAROSCOPY Luvenia Redden BIOPSIES;  Surgeon: [name], MD;  Location: [facility];  Service: Gynecology;  Laterality: N/A;  . PORTACATH PLACEMENT Left 08/05/2020   Procedure: INSERTION PORT-A-CATH;  Surgeon: Herbert Pun, MD;  Location: [facility];  Service: General;  Laterality: Left;  . TOTAL MASTECTOMY Right 08/05/2020   Procedure: TOTAL MASTECTOMY w/ Sentinel Node;  Surgeon: Herbert Pun, MD;  Location: [facility];  Service: General;   Laterality: Right;    SOCIAL HISTORY: Social History   Socioeconomic History  . Marital status: Married    Spouse name: [name]  . Number of children: 0  . Years of education: Not on file  . Highest education level: Not on file  Occupational History  . Occupation: Market researcher: [employer]  Tobacco Use  . Smoking status: Never Smoker  . Smokeless tobacco: Never Used  Vaping Use  . Vaping Use: Never used  Substance and Sexual Activity  . Alcohol use: Not Currently  . Drug use: Never  . Sexual activity: Yes    Birth control/protection: Condom  Other Topics Concern  . 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 Lives in [place].    Social Determinants of Health   Financial Resource Strain: Low Risk   . Difficulty of Paying Living Expenses: Not hard at all  Food Insecurity: Not on file  Transportation Needs: No Transportation Needs  . Lack of Transportation (Medical): No  . Lack of Transportation (Non-Medical): No  Physical Activity: Not on file  Stress: Stress Concern Present  . Feeling of Stress : Very much  Social Connections: Not on file  Intimate Partner Violence: Not on file    FAMILY HISTORY: Family History  Problem Relation Age of Onset  . Cancer Other        great aunt- GI cancer  . Breast cancer Neg  Hx     ALLERGIES:  has No Known Allergies.  MEDICATIONS:  Current Outpatient Medications  Medication Sig Dispense Refill  . acetaminophen (TYLENOL) 500 MG tablet Take 1 tablet (500 mg total) by mouth every 6 (six) hours as needed. For use AFTER surgery 30 tablet 0  . ALPHA-LIPOIC ACID PO Take 500 mg by mouth daily.     Marland Kitchen amphetamine-dextroamphetamine (ADDERALL XR) 30 MG 24 hr capsule Take 30 mg by mouth daily.     . DPH-Lido-AlHydr-MgHydr-Simeth (FIRST-MOUTHWASH BLM) SUSP     . DULoxetine (CYMBALTA) 20 MG capsule Take 2 capsules (40 mg total) by  mouth at bedtime. (Patient taking differently: Take 20 mg by mouth at bedtime.) 60 capsule 2  . lidocaine-prilocaine (EMLA) cream Apply 1 application topically as needed. On the port generously; 30-45 mins prior to chemo appt. 30 g 3  . lisinopril (ZESTRIL) 5 MG tablet Take 5 mg by mouth every evening.    . magic mouthwash w/lidocaine SOLN Take 5 mLs by mouth. 80 ml viscous lidocaine 2%, 80 ml Mylanta, 80 ml Diphenhydramine 12.5 mg/5 ml Elixir, 80 ml Nystatin 100,000 Unit suspension, 80 ml Prednisolone 15 mg/62m, 80 ml Distilled Water. Sig: Swish/Swallow 5-10 ml four times a day as needed. Dispense 480 ml. 3RFs    . ondansetron (ZOFRAN) 8 MG tablet One pill every 8 hours as needed for nausea/vomitting. 40 tablet 3  . pregabalin (LYRICA) 100 MG capsule Take 1 capsule (100 mg total) by mouth 2 (two) times daily. 180 capsule 1  . Probiotic Product (PROBIOTIC ADVANCED PO) Take 1 capsule by mouth daily.     . prochlorperazine (COMPAZINE) 10 MG tablet Take 1 tablet (10 mg total) by mouth every 6 (six) hours as needed for nausea or vomiting. 40 tablet 3  . sodium fluoride (FLUORISHIELD) 1.1 % GEL dental gel Place 1 application onto teeth 2 (two) times daily.    . SODIUM FLUORIDE 5000 PPM 1.1 % PSTE Take by mouth.    . traMADol (ULTRAM) 50 MG tablet Take 1-2 tablets (50-100 mg total) by mouth 2 (two) times daily as needed for severe pain. 60 tablet 1  . trolamine salicylate (ASPERCREME) 10 % cream Apply 1 application topically as needed for muscle pain.     No current facility-administered medications for this visit.      .Marland Kitchen PHYSICAL EXAMINATION: ECOG PERFORMANCE STATUS: 0 - Asymptomatic  Vitals:   12/27/20 0930  BP: 132/89  Pulse: 91  Resp: 18  Temp: 97.8 F (36.6 C)   Filed Weights   12/27/20 0930  Weight: 100 lb (45.4 kg)    Physical Exam Constitutional:      Comments: Alone; ambulating independently.   HENT:     Head: Normocephalic and atraumatic.     Mouth/Throat:     Pharynx:  No oropharyngeal exudate.  Eyes:     Pupils: Pupils are equal, round, and reactive to light.  Cardiovascular:     Rate and Rhythm: Normal rate and regular rhythm.  Pulmonary:     Effort: Pulmonary effort is normal. No respiratory distress.     Breath sounds: Normal breath sounds. No wheezing.  Abdominal:     General: Bowel sounds are normal. There is no distension.     Palpations: Abdomen is soft. There is no mass.     Tenderness: There is no abdominal tenderness. There is no guarding or rebound.  Musculoskeletal:        General: No tenderness. Normal range of motion.  Cervical back: Normal range of motion and neck supple.  Skin:    General: Skin is warm.  Neurological:     Mental Status: She is alert and oriented to person, place, and time.  Psychiatric:        Mood and Affect: Affect normal.      LABORATORY DATA:  I have reviewed the data as listed Lab Results  Component Value Date   WBC 2.4 (L) 12/27/2020   HGB 9.8 (L) 12/27/2020   HCT 30.4 (L) 12/27/2020   MCV 95.6 12/27/2020   PLT 270 12/27/2020   Recent Labs    12/06/20 0837 12/13/20 0834 12/27/20 0851  NA 137 136 138  K 3.8 4.0 3.7  CL 104 104 106  CO2 '26 22 24  ' GLUCOSE 129* 105* 105*  BUN 25* 23* 20  CREATININE 0.71 0.78 0.75  CALCIUM 9.0 9.1 9.1  GFRNONAA >60 >60 >60  PROT 6.9 7.1 6.9  ALBUMIN 3.9 4.0 3.9  AST 31 32 20  ALT 27 32 15  ALKPHOS 54 57 50  BILITOT 0.4 0.5 0.5    RADIOGRAPHIC STUDIES: I have personally reviewed the radiological images as listed and agreed with the findings in the report. No results found.  ASSESSMENT & PLAN:   Carcinoma of upper-outer quadrant of right breast in female, estrogen receptor negative ([place]) # Stage I triple negative breast cancer on adjuvant AC- Taxol chemo.  Tolerating chemotherapy with moderate side effects [small fibre neuropathy-se below S/p 4 cycles of AC; currently on Taxol-3 weeks on 1 week off [sec to PN].   #Proceed with Taxol today [60  mg/m2] Labs today reviewed;  acceptable for treatment today. Tolerating with mild-moderate worse- PN see below.plan 3 w-ON and 1 week OFF  ANC 1.2-; if worse would recommend GCSF.   # Small fiber neuropathy- [Dr.Shah]-on Cymbalta/Lyrica-WORSE; Lower-dose of Taxol; and also 3 weeks on 1 week off;  pain clinic-tramadol prn Dr.Lateef  # Fertility preserving- zoladex monthly plan 3/14]; hot flashes- STABLE.   # DISPOSITION:  # chemo today;  # in 1 week- labs- cbc/cmp;Taxol; Zoladex # follow up in 2 weeks- MD; labs- cbc/cmp; Taxol;  -Dr.B   All questions were answered. The patient/family knows to call the clinic with any problems, questions or concerns.    Cammie Sickle, MD 12/27/2020 1:57 PM

## 2020-12-27 NOTE — Progress Notes (Signed)
Nutrition Follow-up:  Patient with triple negative breast cancer.  Patient s/p mastectomy.  Patient has completed adriamycin/cytoxan.  Receiving taxol.    Met with patient prior to MD and infusion this am.  Patient reports that appetite is good especially last week, treatment was held.  Reports that her dad and stepmom came in this past weekend and she baked a cake. Family helped her with preparing meals.  Breakfast she has been eating granola cereal. Lunch as been frozen meals (chicken pot pies, pizza).  Has been enjoying rotisserie chicken vegetables, rice, garlic rolls.  Continues to drink chocolate milk 1-2 times per day.  Does not really like shakes.      Medications: reviewed  Labs: reviewed  Anthropometrics:   Weight 100 lb today 100 lb 9.6 on 3/7 100 lb on 2/14 102 lb on 12/17 104 lb on 12/6   NUTRITION DIAGNOSIS: Inadequate oral intake continues   INTERVENTION:  Patient to continue eating high calories and protein to maintain weight.   Answered questions regarding sugar and cancer, processed meats.       MONITORING, EVALUATION, GOAL: weight trends, intake   NEXT VISIT: as needed with treatment  Russell Engelstad B. Zenia Resides, [place], [place] Registered Dietitian [phone] (mobile)

## 2021-01-03 ENCOUNTER — Inpatient Hospital Stay: Payer: BC Managed Care – PPO

## 2021-01-03 DIAGNOSIS — C50411 Malignant neoplasm of upper-outer quadrant of right female breast: Secondary | ICD-10-CM | POA: Diagnosis not present

## 2021-01-03 LAB — CBC WITH DIFFERENTIAL/PLATELET
Abs Immature Granulocytes: 0.01 10*3/uL (ref 0.00–0.07)
Basophils Absolute: 0 10*3/uL (ref 0.0–0.1)
Basophils Relative: 1 %
Eosinophils Absolute: 0.3 10*3/uL (ref 0.0–0.5)
Eosinophils Relative: 16 %
HCT: 32 % — ABNORMAL LOW (ref 36.0–46.0)
Hemoglobin: 10.6 g/dL — ABNORMAL LOW (ref 12.0–15.0)
Immature Granulocytes: 1 %
Lymphocytes Relative: 25 %
Lymphs Abs: 0.5 10*3/uL — ABNORMAL LOW (ref 0.7–4.0)
MCH: 31.1 pg (ref 26.0–34.0)
MCHC: 33.1 g/dL (ref 30.0–36.0)
MCV: 93.8 fL (ref 80.0–100.0)
Monocytes Absolute: 0.4 10*3/uL (ref 0.1–1.0)
Monocytes Relative: 17 %
Neutro Abs: 0.8 10*3/uL — ABNORMAL LOW (ref 1.7–7.7)
Neutrophils Relative %: 40 %
Platelets: 257 10*3/uL (ref 150–400)
RBC: 3.41 MIL/uL — ABNORMAL LOW (ref 3.87–5.11)
RDW: 13.8 % (ref 11.5–15.5)
WBC: 2.1 10*3/uL — ABNORMAL LOW (ref 4.0–10.5)
nRBC: 0 % (ref 0.0–0.2)

## 2021-01-03 LAB — COMPREHENSIVE METABOLIC PANEL
ALT: 27 U/L (ref 0–44)
AST: 30 U/L (ref 15–41)
Albumin: 4 g/dL (ref 3.5–5.0)
Alkaline Phosphatase: 55 U/L (ref 38–126)
Anion gap: 9 (ref 5–15)
BUN: 26 mg/dL — ABNORMAL HIGH (ref 6–20)
CO2: 24 mmol/L (ref 22–32)
Calcium: 9 mg/dL (ref 8.9–10.3)
Chloride: 104 mmol/L (ref 98–111)
Creatinine, Ser: 0.82 mg/dL (ref 0.44–1.00)
GFR, Estimated: >60 mL/min (ref >60)
Glucose, Bld: 103 mg/dL — ABNORMAL HIGH (ref 70–99)
Potassium: 3.8 mmol/L (ref 3.5–5.1)
Sodium: 137 mmol/L (ref 135–145)
Total Bilirubin: 0.4 mg/dL (ref 0.3–1.2)
Total Protein: 7 g/dL (ref 6.5–8.1)

## 2021-01-03 MED ORDER — HEPARIN SOD (PORK) LOCK FLUSH 100 UNIT/ML IV SOLN
500.0000 [IU] | Freq: Once | INTRAVENOUS | Status: AC
  Administered 2021-01-03: 500 [IU] via INTRAVENOUS
  Filled 2021-01-03: qty 5

## 2021-01-03 MED ORDER — HEPARIN SOD (PORK) LOCK FLUSH 100 UNIT/ML IV SOLN
INTRAVENOUS | Status: AC
  Filled 2021-01-03: qty 5

## 2021-01-03 NOTE — Progress Notes (Signed)
ANC 800, treatment held today per Dr. Rogue Bussing.

## 2021-01-05 ENCOUNTER — Telehealth: Payer: Self-pay

## 2021-01-05 NOTE — Telephone Encounter (Signed)
LM for patient to call office for pre virtual appointment questions.  

## 2021-01-06 ENCOUNTER — Ambulatory Visit
Payer: BC Managed Care – PPO | Attending: Student in an Organized Health Care Education/Training Program | Admitting: Student in an Organized Health Care Education/Training Program

## 2021-01-06 ENCOUNTER — Encounter: Payer: Self-pay | Admitting: Student in an Organized Health Care Education/Training Program

## 2021-01-06 ENCOUNTER — Other Ambulatory Visit: Payer: Self-pay

## 2021-01-06 DIAGNOSIS — G894 Chronic pain syndrome: Secondary | ICD-10-CM

## 2021-01-06 NOTE — Progress Notes (Signed)
Reschedule for face-to-face visit for medication management prior to 02/04/2021.

## 2021-01-10 ENCOUNTER — Inpatient Hospital Stay (HOSPITAL_BASED_OUTPATIENT_CLINIC_OR_DEPARTMENT_OTHER): Payer: BC Managed Care – PPO | Admitting: Internal Medicine

## 2021-01-10 ENCOUNTER — Inpatient Hospital Stay: Payer: BC Managed Care – PPO

## 2021-01-10 ENCOUNTER — Encounter: Payer: Self-pay | Admitting: Internal Medicine

## 2021-01-10 DIAGNOSIS — C50411 Malignant neoplasm of upper-outer quadrant of right female breast: Secondary | ICD-10-CM | POA: Diagnosis not present

## 2021-01-10 DIAGNOSIS — Z171 Estrogen receptor negative status [ER-]: Secondary | ICD-10-CM | POA: Diagnosis not present

## 2021-01-10 LAB — COMPREHENSIVE METABOLIC PANEL
ALT: 14 U/L (ref 0–44)
AST: 22 U/L (ref 15–41)
Albumin: 3.8 g/dL (ref 3.5–5.0)
Alkaline Phosphatase: 56 U/L (ref 38–126)
Anion gap: 7 (ref 5–15)
BUN: 19 mg/dL (ref 6–20)
CO2: 26 mmol/L (ref 22–32)
Calcium: 9.3 mg/dL (ref 8.9–10.3)
Chloride: 104 mmol/L (ref 98–111)
Creatinine, Ser: 0.83 mg/dL (ref 0.44–1.00)
GFR, Estimated: >60 mL/min (ref >60)
Glucose, Bld: 112 mg/dL — ABNORMAL HIGH (ref 70–99)
Potassium: 3.6 mmol/L (ref 3.5–5.1)
Sodium: 137 mmol/L (ref 135–145)
Total Bilirubin: 0.5 mg/dL (ref 0.3–1.2)
Total Protein: 6.9 g/dL (ref 6.5–8.1)

## 2021-01-10 LAB — CBC WITH DIFFERENTIAL/PLATELET
Abs Immature Granulocytes: 0.01 10*3/uL (ref 0.00–0.07)
Basophils Absolute: 0 10*3/uL (ref 0.0–0.1)
Basophils Relative: 0 %
Eosinophils Absolute: 0.2 10*3/uL (ref 0.0–0.5)
Eosinophils Relative: 10 %
HCT: 31.9 % — ABNORMAL LOW (ref 36.0–46.0)
Hemoglobin: 10.5 g/dL — ABNORMAL LOW (ref 12.0–15.0)
Immature Granulocytes: 0 %
Lymphocytes Relative: 25 %
Lymphs Abs: 0.6 10*3/uL — ABNORMAL LOW (ref 0.7–4.0)
MCH: 30.6 pg (ref 26.0–34.0)
MCHC: 32.9 g/dL (ref 30.0–36.0)
MCV: 93 fL (ref 80.0–100.0)
Monocytes Absolute: 0.2 10*3/uL (ref 0.1–1.0)
Monocytes Relative: 10 %
Neutro Abs: 1.3 10*3/uL — ABNORMAL LOW (ref 1.7–7.7)
Neutrophils Relative %: 55 %
Platelets: 293 10*3/uL (ref 150–400)
RBC: 3.43 MIL/uL — ABNORMAL LOW (ref 3.87–5.11)
RDW: 13.7 % (ref 11.5–15.5)
WBC: 2.5 10*3/uL — ABNORMAL LOW (ref 4.0–10.5)
nRBC: 0 % (ref 0.0–0.2)

## 2021-01-10 MED ORDER — HEPARIN SOD (PORK) LOCK FLUSH 100 UNIT/ML IV SOLN
INTRAVENOUS | Status: AC
  Filled 2021-01-10: qty 5

## 2021-01-10 MED ORDER — DIPHENHYDRAMINE HCL 50 MG/ML IJ SOLN
50.0000 mg | Freq: Once | INTRAMUSCULAR | Status: AC
  Administered 2021-01-10: 50 mg via INTRAVENOUS
  Filled 2021-01-10: qty 1

## 2021-01-10 MED ORDER — FAMOTIDINE 20 MG IN NS 100 ML IVPB
20.0000 mg | Freq: Once | INTRAVENOUS | Status: AC
  Administered 2021-01-10: 20 mg via INTRAVENOUS
  Filled 2021-01-10: qty 20

## 2021-01-10 MED ORDER — HEPARIN SOD (PORK) LOCK FLUSH 100 UNIT/ML IV SOLN
500.0000 [IU] | Freq: Once | INTRAVENOUS | Status: DC
  Filled 2021-01-10: qty 5

## 2021-01-10 MED ORDER — SODIUM CHLORIDE 0.9 % IV SOLN
Freq: Once | INTRAVENOUS | Status: AC
Start: 2021-01-10 — End: 2021-01-10
  Filled 2021-01-10: qty 250

## 2021-01-10 MED ORDER — SODIUM CHLORIDE 0.9 % IV SOLN
20.0000 mg | Freq: Once | INTRAVENOUS | Status: AC
  Administered 2021-01-10: 20 mg via INTRAVENOUS
  Filled 2021-01-10: qty 20

## 2021-01-10 MED ORDER — SODIUM CHLORIDE 0.9 % IV SOLN
60.0000 mg/m2 | Freq: Once | INTRAVENOUS | Status: AC
  Administered 2021-01-10: 84 mg via INTRAVENOUS
  Filled 2021-01-10: qty 14

## 2021-01-10 MED ORDER — SODIUM CHLORIDE 0.9% FLUSH
10.0000 mL | INTRAVENOUS | Status: DC | PRN
  Administered 2021-01-10: 10 mL via INTRAVENOUS
  Filled 2021-01-10: qty 10

## 2021-01-10 MED ORDER — GOSERELIN ACETATE 3.6 MG ~~LOC~~ IMPL
3.6000 mg | DRUG_IMPLANT | Freq: Once | SUBCUTANEOUS | Status: AC
  Administered 2021-01-10: 3.6 mg via SUBCUTANEOUS
  Filled 2021-01-10: qty 3.6

## 2021-01-10 MED ORDER — HEPARIN SOD (PORK) LOCK FLUSH 100 UNIT/ML IV SOLN
500.0000 [IU] | Freq: Once | INTRAVENOUS | Status: AC | PRN
  Administered 2021-01-10: 500 [IU]
  Filled 2021-01-10: qty 5

## 2021-01-10 NOTE — Progress Notes (Signed)
[facility] [place] NOTE  Patient Care Team: [place], [name], [name]  CHIEF COMPLAINTS/PURPOSE OF CONSULTATION: Breast cancer  #  Oncology History Overview Note  # OCT 2021-RIGHT BREAST- TRIPLE NEGATIVE [place]; [US/mammo-34m]; Dr.Cintron; OCT 2021-USThere is a 7 mm mass in the right breast at 10 o'clock, favored to represent a complicated cyst;  No evidence of right axillary lymphadenopathy].   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 [Dr.Shah]- skin biopsy- on cymblata+ Lyrica  # DEC 2021-status post evaluation with reproductive endocrinology [9-10% chance of viable pregnancy] # # SURVIVORSHIP:   # GENETICS: BARD  DIAGNOSIS: Breast cancer-triple negative  STAGE:  I       ;  GOALS: cure  CURRENT/MOST RECENT THERAPY : AC-T    Carcinoma of upper-outer quadrant of right breast in female, estrogen receptor negative ([facility] 07/13[place] Initial Diagnosis   Carcinoma of upper-outer quadrant of right breast in female, estrogen receptor negative ([facility]   Genetic Testing   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 The report date is 08/02/2020.  The Multi-Cancer Panel offered by [facility] includes sequencing and/or deletion duplication testing of the following 85 genes: AIP, ALK, APC, ATM, AXIN2,BAP1,  BARD1, BLM, BMPR1A, BRCA1, BRCA2, BRIP1, CASR, CDC73, CDH1, CDK4, CDKN1B, CDKN1C, CDKN2A (p14ARF), CDKN2A (p16INK4a), CEBPA, CHEK2, CTNNA1, DICER1, DIS3L2, EGFR (c.2369C>T, p.Thr790Met variant only), EPCAM (Deletion/duplication testing only), FH, FLCN, GATA2, GPC3, GREM1 (Promoter region deletion/duplication testing only), HOXB13 (c.251G>A, p.Gly84Glu), HRAS, KIT, MAX, MEN1, MET, MITF (c.952G>A, p.Glu318Lys variant  only), MLH1, MSH2, MSH3, MSH6, MUTYH, NBN, NF1, NF2, NTHL1, PALB2, PDGFRA, PHOX2B, PMS2, POLD1, POLE, POT1, PRKAR1A, PTCH1, PTEN, RAD50, RAD51C, RAD51D, RB1, RECQL4, RET, RNF43, RUNX1, SDHAF2, SDHA (sequence changes only), SDHB, SDHC, SDHD, SMAD4, SMARCA4, SMARCB1, SMARCE1, STK11, SUFU, TERC, TERT, TMEM127, TP53, TSC1, TSC2, VHL, WRN and WT1.    09/10/2020 -  Chemotherapy    Patient is on Treatment Plan: BREAST ADJUVANT DOSE DENSE AC Q14D / PACLITAXEL Q7D         HISTORY OF PRESENTING ILLNESS:  Beth Denunzio89.o.  female   with stage I triple negative breast cancer currently currently on adjuvant Taxol is here for follow-up.  Patient's chemotherapy #5 was held last week because of neutropenia ANC 800.  Patient denies any fevers or chills.  Denies any rash.  She continues to have tingling and numbness of the extremities.  Continues to have neuropathic pain in random places.   Review of Systems  Constitutional: Positive for malaise/fatigue. Negative for chills, diaphoresis, fever and weight loss.  HENT: Negative for nosebleeds and sore throat.   Eyes: Negative for double vision.  Respiratory: Negative for cough, hemoptysis, sputum production, shortness of breath and wheezing.   Cardiovascular: Negative for chest pain, palpitations, orthopnea and leg swelling.  Gastrointestinal: Negative for abdominal pain, blood in stool, diarrhea, heartburn, melena and vomiting.  Genitourinary: Negative for dysuria, frequency and urgency.  Musculoskeletal: Negative for back pain and joint pain.  Skin: Negative.  Negative for itching and rash.  Neurological: Positive for tingling. Negative for dizziness, focal weakness, weakness and headaches.  Endo/Heme/Allergies: Does not bruise/bleed easily.  Psychiatric/Behavioral: Negative for depression. The patient is not nervous/anxious and does not have insomnia.      MEDICAL HISTORY:  Past Medical History:  Diagnosis Date  . Breast cancer ([place])  08/05/2020  . Carcinoma of upper-outer quadrant of right breast in female, estrogen receptor negative ([place]) 07/13/2020  . Complication of anesthesia    hard to wake up-very dizzy feeling like she was going to pass out  . GERD (gastroesophageal reflux disease)    no meds  . H/O foot surgery 08/2018  . History of kidney stones    h/o  . Hypertension   . PONV (postoperative nausea and vomiting)    n/v aftter kidney stone surgery    SURGICAL HISTORY: Past Surgical History:  Procedure Laterality Date  . BREAST BIOPSY Right 07/06/2020   u/s bx Q clip path pending  . BREAST RECONSTRUCTION WITH PLACEMENT OF TISSUE EXPANDER AND FLEX HD (ACELLULAR HYDRATED DERMIS) Right 08/05/2020   Procedure: RIGHT BREAST RECONSTRUCTION WITH PLACEMENT OF TISSUE EXPANDER AND FLEX HD (ACELLULAR HYDRATED DERMIS);  Surgeon: Wallace Going, DO;  Location: [facility] ORS;  Service: Plastics;  Laterality: Right;  . BUNIONECTOMY Right    titanium pin  . CERVICAL CONE BIOPSY    . DILATION AND EVACUATION N/A 03/12/2018   Procedure: DILATATION AND EVACUATION;  Surgeon: [name], MD;  Location: [facility];  Service: Gynecology;  Laterality: N/A;  . KIDNEY STONE SURGERY    . LAPAROSCOPIC OVARIAN CYSTECTOMY Right 06/07/2019   Procedure: LAPAROSCOPIC OVARIAN CYSTECTOMY;  Surgeon: [name], MD;  Location: [facility];  Service: Gynecology;  Laterality: Right;  . LAPAROSCOPY N/A 06/07/2019   Procedure: LAPAROSCOPY Luvenia Redden BIOPSIES;  Surgeon: [name], MD;  Location: [facility];  Service: Gynecology;  Laterality: N/A;  . PORTACATH PLACEMENT Left 08/05/2020   Procedure: INSERTION PORT-A-CATH;  Surgeon: Herbert Pun, MD;  Location: [facility];  Service: General;  Laterality: Left;  . TOTAL MASTECTOMY Right 08/05/2020   Procedure: TOTAL MASTECTOMY w/ Sentinel Node;  Surgeon: Herbert Pun, MD;  Location: [facility];  Service: General;  Laterality: Right;    SOCIAL HISTORY: Social  History   Socioeconomic History  . Marital status: Married    Spouse name: [name]  . Number of children: 0  . Years of education: Not on file  . Highest education level: Not on file  Occupational History  . Occupation: Market researcher: [employer]  Tobacco Use  . Smoking status: Never Smoker  . Smokeless tobacco: Never Used  Vaping Use  . Vaping Use: Never used  Substance and Sexual Activity  . Alcohol use: Not Currently  . Drug use: Never  . Sexual activity: Yes    Birth control/protection: Condom  Other Topics Concern  . 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 Lives in [place].    Social Determinants of Health   Financial Resource Strain: Low Risk   . Difficulty of Paying Living Expenses: Not hard at all  Food Insecurity: Not on file  Transportation Needs: No Transportation Needs  . Lack of Transportation (Medical): No  . Lack of Transportation (Non-Medical): No  Physical Activity: Not on file  Stress: Stress Concern Present  . Feeling of Stress : Very much  Social Connections: Not on file  Intimate Partner Violence: Not on file    FAMILY HISTORY: Family History  Problem Relation Age of Onset  . Cancer Other        great aunt- GI cancer  . Breast cancer Neg Hx     ALLERGIES:  has No Known  Allergies.  MEDICATIONS:  Current Outpatient Medications  Medication Sig Dispense Refill  . acetaminophen (TYLENOL) 500 MG tablet Take 1 tablet (500 mg total) by mouth every 6 (six) hours as needed. For use AFTER surgery 30 tablet 0  . ALPHA-LIPOIC ACID PO Take 500 mg by mouth daily.     Marland Kitchen amphetamine-dextroamphetamine (ADDERALL XR) 30 MG 24 hr capsule Take 30 mg by mouth daily.     . DPH-Lido-AlHydr-MgHydr-Simeth (FIRST-MOUTHWASH BLM) SUSP     . DULoxetine (CYMBALTA) 20 MG capsule Take 2 capsules (40 mg total) by mouth at bedtime. (Patient taking differently:  Take 20 mg by mouth at bedtime.) 60 capsule 2  . lidocaine-prilocaine (EMLA) cream Apply 1 application topically as needed. On the port generously; 30-45 mins prior to chemo appt. 30 g 3  . lisinopril (ZESTRIL) 5 MG tablet Take 5 mg by mouth every evening.    . magic mouthwash w/lidocaine SOLN Take 5 mLs by mouth. 80 ml viscous lidocaine 2%, 80 ml Mylanta, 80 ml Diphenhydramine 12.5 mg/5 ml Elixir, 80 ml Nystatin 100,000 Unit suspension, 80 ml Prednisolone 15 mg/11m, 80 ml Distilled Water. Sig: Swish/Swallow 5-10 ml four times a day as needed. Dispense 480 ml. 3RFs    . ondansetron (ZOFRAN) 8 MG tablet One pill every 8 hours as needed for nausea/vomitting. 40 tablet 3  . pregabalin (LYRICA) 100 MG capsule Take 1 capsule (100 mg total) by mouth 2 (two) times daily. 180 capsule 1  . Probiotic Product (PROBIOTIC ADVANCED PO) Take 1 capsule by mouth daily.     . prochlorperazine (COMPAZINE) 10 MG tablet Take 1 tablet (10 mg total) by mouth every 6 (six) hours as needed for nausea or vomiting. 40 tablet 3  . sodium fluoride (FLUORISHIELD) 1.1 % GEL dental gel Place 1 application onto teeth 2 (two) times daily.    . SODIUM FLUORIDE 5000 PPM 1.1 % PSTE Take by mouth.    . traMADol (ULTRAM) 50 MG tablet Take 1-2 tablets (50-100 mg total) by mouth 2 (two) times daily as needed for severe pain. 60 tablet 1  . trolamine salicylate (ASPERCREME) 10 % cream Apply 1 application topically as needed for muscle pain.     No current facility-administered medications for this visit.   Facility-Administered Medications Ordered in Other Visits  Medication Dose Route Frequency Provider Last Rate Last Admin  . goserelin (ZOLADEX) injection 3.6 mg  3.6 mg Subcutaneous Once BCharlaine DaltonR, MD      . heparin lock flush 100 unit/mL  500 Units Intravenous Once BCharlaine DaltonR, MD      . heparin lock flush 100 unit/mL  500 Units Intracatheter Once PRN BCammie Sickle MD      . PACLitaxel (TAXOL) 84 mg in  sodium chloride 0.9 % 250 mL chemo infusion (</= 826mm2)  60 mg/m2 (Treatment Plan Recorded) Intravenous Once BrCharlaine Dalton, MD      . sodium chloride flush (NS) 0.9 % injection 10 mL  10 mL Intravenous PRN BrCammie SickleMD   10 mL at 01/10/21 0939      .  PHYSICAL EXAMINATION: ECOG PERFORMANCE STATUS: 0 - Asymptomatic  Vitals:   01/10/21 0948  BP: (!) 127/93  Pulse: 90  Resp: 16  Temp: 98.4 F (36.9 C)  SpO2: 100%   Filed Weights   01/10/21 0948  Weight: 100 lb (45.4 kg)    Physical Exam Constitutional:      Comments: Alone; ambulating independently.   HENT:  Head: Normocephalic and atraumatic.     Mouth/Throat:     Pharynx: No oropharyngeal exudate.  Eyes:     Pupils: Pupils are equal, round, and reactive to light.  Cardiovascular:     Rate and Rhythm: Normal rate and regular rhythm.  Pulmonary:     Effort: Pulmonary effort is normal. No respiratory distress.     Breath sounds: Normal breath sounds. No wheezing.  Abdominal:     General: Bowel sounds are normal. There is no distension.     Palpations: Abdomen is soft. There is no mass.     Tenderness: There is no abdominal tenderness. There is no guarding or rebound.  Musculoskeletal:        General: No tenderness. Normal range of motion.     Cervical back: Normal range of motion and neck supple.  Skin:    General: Skin is warm.  Neurological:     Mental Status: She is alert and oriented to person, place, and time.  Psychiatric:        Mood and Affect: Affect normal.      LABORATORY DATA:  I have reviewed the data as listed Lab Results  Component Value Date   WBC 2.5 (L) 01/10/2021   HGB 10.5 (L) 01/10/2021   HCT 31.9 (L) 01/10/2021   MCV 93.0 01/10/2021   PLT 293 01/10/2021   Recent Labs    12/27/20 0851 01/03/21 0842 01/10/21 0940  NA 138 137 137  K 3.7 3.8 3.6  CL 106 104 104  CO2 _0 GLUCOSE 105* 103* 112*  BUN 20 26* 19  CREATININE 0.75 0.82 0.83  CALCIUM  9.1 9.0 9.3  GFRNONAA >60 >60 >60  PROT 6.9 7.0 6.9  ALBUMIN 3.9 4.0 3.8  AST _1 ALT _2 ALKPHOS 50 55 56  BILITOT 0.5 0.4 0.5    RADIOGRAPHIC STUDIES: I have personally reviewed the radiological images as listed and agreed with the findings in the report. No results found.  ASSESSMENT & PLAN:   Carcinoma of upper-outer quadrant of right breast in female, estrogen receptor negative ([place]) # Stage I triple negative breast cancer on adjuvant AC- Taxol chemo.  Tolerating chemotherapy with moderate side effects [small fibre neuropathy-se below S/p 4 cycles of AC; currently on Taxol-3 weeks on 1 week off [sec to PN].   #Proceed with Taxol#5 today [60 mg/m2] Labs today reviewed;  acceptable for treatment today. Tolerating with mild-moderate worse- PN see below. With #5 cycle-plan 3 w-ON and 1 week OFF  ANC 1.3.  # Neutropenia-secondary to chemotherapy-ANC 0.8 [4/11]-plan adding Granix post chemo x 2 days.   # Small fiber neuropathy- [Dr.Shah]-on Cymbalta/Lyrica- STABLE [ Lower-dose of Taxol; and also 3 weeks on 1 week off]; pain clinic-tramadol prn Dr.Lateef  # Fertility preserving- zoladex monthly plan 3/14]; hot flashes- STABLE.   # DISPOSITION:  # chemo today;  #ADD Zarxio- 4/21 & 4/22 # in 1 week- labs- cbc/cmp;Taxol; Zoladex; 4/28& 4/29- zarxio  # follow up in 2 weeks- MD; labs- cbc/cmp; Taxol - 5/5 & 5/6- zarxio;  -Dr.B   All questions were answered. The patient/family knows to call the clinic with any problems, questions or concerns.    Cammie Sickle, MD 01/10/2021 11:37 AM

## 2021-01-10 NOTE — Assessment & Plan Note (Addendum)
#   Stage I triple negative breast cancer on adjuvant AC- Taxol chemo.  Tolerating chemotherapy with moderate side effects [small fibre neuropathy-se below S/p 4 cycles of AC; currently on Taxol-3 weeks on 1 week off [sec to PN].   #Proceed with Taxol#5 today [60 mg/m2] Labs today reviewed;  acceptable for treatment today. Tolerating with mild-moderate worse- PN see below. With #5 cycle-plan 3 w-ON and 1 week OFF  ANC 1.3.  # Neutropenia-secondary to chemotherapy-ANC 0.8 [4/11]-plan adding Granix post chemo x 2 days.   # Small fiber neuropathy- [Dr.Shah]-on Cymbalta/Lyrica- STABLE [ Lower-dose of Taxol; and also 3 weeks on 1 week off]; pain clinic-tramadol prn Dr.Lateef  # Fertility preserving- zoladex monthly plan 3/14]; hot flashes- STABLE.   # DISPOSITION:  # chemo today;  #ADD Zarxio- 4/21 & 4/22 # in 1 week- labs- cbc/cmp;Taxol; Zoladex; 4/28& 4/29- zarxio  # follow up in 2 weeks- MD; labs- cbc/cmp; Taxol - 5/5 & 5/6- zarxio;  -Dr.B

## 2021-01-13 ENCOUNTER — Inpatient Hospital Stay: Payer: BC Managed Care – PPO

## 2021-01-13 DIAGNOSIS — C50411 Malignant neoplasm of upper-outer quadrant of right female breast: Secondary | ICD-10-CM | POA: Diagnosis not present

## 2021-01-13 MED ORDER — FILGRASTIM-SNDZ 300 MCG/0.5ML IJ SOSY
300.0000 ug | PREFILLED_SYRINGE | Freq: Once | INTRAMUSCULAR | Status: AC
  Administered 2021-01-13: 300 ug via SUBCUTANEOUS
  Filled 2021-01-13: qty 0.5

## 2021-01-14 ENCOUNTER — Encounter: Payer: Self-pay | Admitting: Internal Medicine

## 2021-01-14 ENCOUNTER — Inpatient Hospital Stay: Payer: BC Managed Care – PPO

## 2021-01-14 ENCOUNTER — Other Ambulatory Visit: Payer: Self-pay

## 2021-01-14 DIAGNOSIS — C50411 Malignant neoplasm of upper-outer quadrant of right female breast: Secondary | ICD-10-CM

## 2021-01-14 MED ORDER — FILGRASTIM-SNDZ 300 MCG/0.5ML IJ SOSY
300.0000 ug | PREFILLED_SYRINGE | Freq: Once | INTRAMUSCULAR | Status: AC
  Administered 2021-01-14: 300 ug via SUBCUTANEOUS

## 2021-01-17 ENCOUNTER — Inpatient Hospital Stay: Payer: BC Managed Care – PPO

## 2021-01-17 ENCOUNTER — Other Ambulatory Visit: Payer: Self-pay

## 2021-01-17 ENCOUNTER — Inpatient Hospital Stay (HOSPITAL_BASED_OUTPATIENT_CLINIC_OR_DEPARTMENT_OTHER): Payer: BC Managed Care – PPO | Admitting: Internal Medicine

## 2021-01-17 VITALS — BP 119/82 | HR 94 | Temp 96.2°F | Resp 18 | Wt 101.6 lb

## 2021-01-17 DIAGNOSIS — C50411 Malignant neoplasm of upper-outer quadrant of right female breast: Secondary | ICD-10-CM

## 2021-01-17 DIAGNOSIS — Z171 Estrogen receptor negative status [ER-]: Secondary | ICD-10-CM

## 2021-01-17 LAB — CBC WITH DIFFERENTIAL/PLATELET
Abs Immature Granulocytes: 0.06 10*3/uL (ref 0.00–0.07)
Basophils Absolute: 0 10*3/uL (ref 0.0–0.1)
Basophils Relative: 1 %
Eosinophils Absolute: 0.4 10*3/uL (ref 0.0–0.5)
Eosinophils Relative: 8 %
HCT: 33.7 % — ABNORMAL LOW (ref 36.0–46.0)
Hemoglobin: 11.1 g/dL — ABNORMAL LOW (ref 12.0–15.0)
Immature Granulocytes: 1 %
Lymphocytes Relative: 16 %
Lymphs Abs: 0.8 10*3/uL (ref 0.7–4.0)
MCH: 30.7 pg (ref 26.0–34.0)
MCHC: 32.9 g/dL (ref 30.0–36.0)
MCV: 93.1 fL (ref 80.0–100.0)
Monocytes Absolute: 0.6 10*3/uL (ref 0.1–1.0)
Monocytes Relative: 12 %
Neutro Abs: 3.2 10*3/uL (ref 1.7–7.7)
Neutrophils Relative %: 62 %
Platelets: 259 10*3/uL (ref 150–400)
RBC: 3.62 MIL/uL — ABNORMAL LOW (ref 3.87–5.11)
RDW: 13.7 % (ref 11.5–15.5)
WBC: 5.1 10*3/uL (ref 4.0–10.5)
nRBC: 0 % (ref 0.0–0.2)

## 2021-01-17 LAB — COMPREHENSIVE METABOLIC PANEL
ALT: 21 U/L (ref 0–44)
AST: 26 U/L (ref 15–41)
Albumin: 4 g/dL (ref 3.5–5.0)
Alkaline Phosphatase: 70 U/L (ref 38–126)
Anion gap: 9 (ref 5–15)
BUN: 26 mg/dL — ABNORMAL HIGH (ref 6–20)
CO2: 26 mmol/L (ref 22–32)
Calcium: 9.2 mg/dL (ref 8.9–10.3)
Chloride: 105 mmol/L (ref 98–111)
Creatinine, Ser: 0.96 mg/dL (ref 0.44–1.00)
GFR, Estimated: >60 mL/min (ref >60)
Glucose, Bld: 94 mg/dL (ref 70–99)
Potassium: 3.8 mmol/L (ref 3.5–5.1)
Sodium: 140 mmol/L (ref 135–145)
Total Bilirubin: 0.3 mg/dL (ref 0.3–1.2)
Total Protein: 6.9 g/dL (ref 6.5–8.1)

## 2021-01-17 MED ORDER — DIPHENHYDRAMINE HCL 50 MG/ML IJ SOLN
50.0000 mg | Freq: Once | INTRAMUSCULAR | Status: AC
  Administered 2021-01-17: 50 mg via INTRAVENOUS
  Filled 2021-01-17: qty 1

## 2021-01-17 MED ORDER — DEXAMETHASONE SODIUM PHOSPHATE 100 MG/10ML IJ SOLN
20.0000 mg | Freq: Once | INTRAMUSCULAR | Status: AC
  Administered 2021-01-17: 20 mg via INTRAVENOUS
  Filled 2021-01-17: qty 20

## 2021-01-17 MED ORDER — FAMOTIDINE 20 MG IN NS 100 ML IVPB
20.0000 mg | Freq: Once | INTRAVENOUS | Status: AC
  Administered 2021-01-17: 20 mg via INTRAVENOUS
  Filled 2021-01-17: qty 20

## 2021-01-17 MED ORDER — SODIUM CHLORIDE 0.9 % IV SOLN
60.0000 mg/m2 | Freq: Once | INTRAVENOUS | Status: AC
  Administered 2021-01-17: 84 mg via INTRAVENOUS
  Filled 2021-01-17: qty 14

## 2021-01-17 MED ORDER — SODIUM CHLORIDE 0.9 % IV SOLN
Freq: Once | INTRAVENOUS | Status: AC
  Filled 2021-01-17: qty 250

## 2021-01-17 MED ORDER — HEPARIN SOD (PORK) LOCK FLUSH 100 UNIT/ML IV SOLN
500.0000 [IU] | Freq: Once | INTRAVENOUS | Status: AC | PRN
  Administered 2021-01-17: 500 [IU]
  Filled 2021-01-17: qty 5

## 2021-01-17 NOTE — Assessment & Plan Note (Addendum)
#   Stage I triple negative breast cancer on adjuvant AC- Taxol chemo.  Tolerating chemotherapy with moderate side effects [small fibre neuropathy-se below S/p 4 cycles of AC; currently on Taxol-3 weeks on 1 week off [sec to PN].   #Proceed with Taxol#6 today [60 mg/m2] Labs today reviewed;  acceptable for treatment today. Tolerating with  moderate worse- PN see below.   Discussed that most of the adjuvant benefit would be from Adriamycin Cytoxan which she finished 4 cycles.  I had a long discussion with patient regarding discontinuation of Taxol-given her progressive worsening neuropathy/especially in context of her stage I breast cancer.  Patient is quite obviously anxious given the above complicated situation.  # Neutropenia-secondary to chemotherapy-ANC 0.8 [4/11]-plan adding Granix post chemo x 2 days.   # Small fiber neuropathy- [Dr.Shah]-on Cymbalta/Lyrica-worsening-[ Lower-dose of Taxol; and also 3 weeks on 1 week off]; pain clinic-tramadol prn Dr.Lateef.  See above-discontinue Taxol  # Fertility preserving- zoladex monthly plan 4/184]; hot flashes- STABLE.   # DISPOSITION:  # taxol as planned # as planned-  4/28& 4/29- zarxio  # follow up as planned in 1 weeks- MD; labs- cbc/cmp; Taxol - 5/5 & 5/6- zarxio;  -Dr.B

## 2021-01-17 NOTE — Progress Notes (Signed)
Diffuse pinpoint rash to bilateral lower legs. Stated that she noticed it Saturday after her shower. Denies itching. Stated that she used moisturizing lotion, but no other topical medications for the symptoms. States that it has improved since Saturday.

## 2021-01-17 NOTE — Patient Instructions (Signed)
[facility]    Discharge Instructions: Thank you for choosing [facility] to provide your oncology and hematology care.  If you have a lab appointment with the Cancer Center, please go directly to the Cancer Center and check in at the registration area.  Wear comfortable clothing and clothing appropriate for easy access to any Portacath or PICC line.   We strive to give you quality time with your provider. You may need to reschedule your appointment if you arrive late (15 or more minutes).  Arriving late affects you and other patients whose appointments are after yours.  Also, if you miss three or more appointments without notifying the office, you may be dismissed from the clinic at the provider's discretion.      For prescription refill requests, have your pharmacy contact our office and allow 72 hours for refills to be completed.    Today you received the following chemotherapy and/or immunotherapy agents - Taxol   To help prevent nausea and vomiting after your treatment, we encourage you to take your nausea medication as directed.  BELOW ARE SYMPTOMS THAT SHOULD BE REPORTED IMMEDIATELY: . *FEVER GREATER THAN 100.4 F (38 C) OR HIGHER . *CHILLS OR SWEATING . *NAUSEA AND VOMITING THAT IS NOT CONTROLLED WITH YOUR NAUSEA MEDICATION . *UNUSUAL SHORTNESS OF BREATH . *UNUSUAL BRUISING OR BLEEDING . *URINARY PROBLEMS (pain or burning when urinating, or frequent urination) . *BOWEL PROBLEMS (unusual diarrhea, constipation, pain near the anus) . TENDERNESS IN MOUTH AND THROAT WITH OR WITHOUT PRESENCE OF ULCERS (sore throat, sores in mouth, or a toothache) . UNUSUAL RASH, SWELLING OR PAIN  . UNUSUAL VAGINAL DISCHARGE OR ITCHING   Items with * indicate a potential emergency and should be followed up as soon as possible or go to the Emergency Department if any problems should occur.  Please show the CHEMOTHERAPY ALERT CARD or IMMUNOTHERAPY ALERT  CARD at check-in to the Emergency Department and triage nurse.  Should you have questions after your visit or need to cancel or reschedule your appointment, please contact [facility]  [phone] and follow the prompts.  Office hours are 8:00 a.m. to 4:30 p.m. Monday - Friday. Please note that voicemails left after 4:00 p.m. may not be returned until the following business day.  We are closed weekends and major holidays. You have access to a nurse at all times for urgent questions. Please call the main number to the clinic [phone] and follow the prompts.  For any non-urgent questions, you may also contact your provider using MyChart. We now offer e-Visits for anyone 18 and older to request care online for non-urgent symptoms. For details visit [email or web address].   Also download the MyChart app! Go to the app store, search "MyChart", open the app, select [place], and log in with your MyChart username and password.  Due to Covid, a mask is required upon entering the hospital/clinic. If you do not have a mask, one will be given to you upon arrival. For doctor visits, patients may have 1 support person aged 18 or older with them. For treatment visits, patients cannot have anyone with them due to current Covid guidelines and our immunocompromised population.   Paclitaxel injection What is this medicine? PACLITAXEL (PAK li TAX el) is a chemotherapy drug. It targets fast dividing cells, like cancer cells, and causes these cells to die. This medicine is used to treat ovarian cancer, breast cancer, lung cancer, Kaposi's sarcoma, and other   cancers. This medicine may be used for other purposes; ask your health care provider or pharmacist if you have questions. COMMON BRAND NAME(S): Onxol, Taxol What should I tell my health care provider before I take this medicine? They need to know if you have any of these conditions:  history of irregular  heartbeat  liver disease  low blood counts, like low white cell, platelet, or red cell counts  lung or breathing disease, like asthma  tingling of the fingers or toes, or other nerve disorder  an unusual or allergic reaction to paclitaxel, alcohol, polyoxyethylated castor oil, other chemotherapy, other medicines, foods, dyes, or preservatives  pregnant or trying to get pregnant  breast-feeding How should I use this medicine? This drug is given as an infusion into a vein. It is administered in a hospital or clinic by a specially trained health care professional. Talk to your pediatrician regarding the use of this medicine in children. Special care may be needed. Overdosage: If you think you have taken too much of this medicine contact a poison control center or emergency room at once. NOTE: This medicine is only for you. Do not share this medicine with others. What if I miss a dose? It is important not to miss your dose. Call your doctor or health care professional if you are unable to keep an appointment. What may interact with this medicine? Do not take this medicine with any of the following medications:  live virus vaccines This medicine may also interact with the following medications:  antiviral medicines for hepatitis, HIV or AIDS  certain antibiotics like erythromycin and clarithromycin  certain medicines for fungal infections like ketoconazole and itraconazole  certain medicines for seizures like carbamazepine, phenobarbital, phenytoin  gemfibrozil  nefazodone  rifampin  St. John's wort This list may not describe all possible interactions. Give your health care provider a list of all the medicines, herbs, non-prescription drugs, or dietary supplements you use. Also tell them if you smoke, drink alcohol, or use illegal drugs. Some items may interact with your medicine. What should I watch for while using this medicine? Your condition will be monitored carefully  while you are receiving this medicine. You will need important blood work done while you are taking this medicine. This medicine can cause serious allergic reactions. To reduce your risk you will need to take other medicine(s) before treatment with this medicine. If you experience allergic reactions like skin rash, itching or hives, swelling of the face, lips, or tongue, tell your doctor or health care professional right away. In some cases, you may be given additional medicines to help with side effects. Follow all directions for their use. This drug may make you feel generally unwell. This is not uncommon, as chemotherapy can affect healthy cells as well as cancer cells. Report any side effects. Continue your course of treatment even though you feel ill unless your doctor tells you to stop. Call your doctor or health care professional for advice if you get a fever, chills or sore throat, or other symptoms of a cold or flu. Do not treat yourself. This drug decreases your body's ability to fight infections. Try to avoid being around people who are sick. This medicine may increase your risk to bruise or bleed. Call your doctor or health care professional if you notice any unusual bleeding. Be careful brushing and flossing your teeth or using a toothpick because you may get an infection or bleed more easily. If you have any dental work done, tell   your dentist you are receiving this medicine. Avoid taking products that contain aspirin, acetaminophen, ibuprofen, naproxen, or ketoprofen unless instructed by your doctor. These medicines may hide a fever. Do not become pregnant while taking this medicine. Women should inform their doctor if they wish to become pregnant or think they might be pregnant. There is a potential for serious side effects to an unborn child. Talk to your health care professional or pharmacist for more information. Do not breast-feed an infant while taking this medicine. Men are advised not  to father a child while receiving this medicine. This product may contain alcohol. Ask your pharmacist or healthcare provider if this medicine contains alcohol. Be sure to tell all healthcare providers you are taking this medicine. Certain medicines, like metronidazole and disulfiram, can cause an unpleasant reaction when taken with alcohol. The reaction includes flushing, headache, nausea, vomiting, sweating, and increased thirst. The reaction can last from 30 minutes to several hours. What side effects may I notice from receiving this medicine? Side effects that you should report to your doctor or health care professional as soon as possible:  allergic reactions like skin rash, itching or hives, swelling of the face, lips, or tongue  breathing problems  changes in vision  fast, irregular heartbeat  high or low blood pressure  mouth sores  pain, tingling, numbness in the hands or feet  signs of decreased platelets or bleeding - bruising, pinpoint red spots on the skin, black, tarry stools, blood in the urine  signs of decreased red blood cells - unusually weak or tired, feeling faint or lightheaded, falls  signs of infection - fever or chills, cough, sore throat, pain or difficulty passing urine  signs and symptoms of liver injury like dark yellow or brown urine; general ill feeling or flu-like symptoms; light-colored stools; loss of appetite; nausea; right upper belly pain; unusually weak or tired; yellowing of the eyes or skin  swelling of the ankles, feet, hands  unusually slow heartbeat Side effects that usually do not require medical attention (report to your doctor or health care professional if they continue or are bothersome):  diarrhea  hair loss  loss of appetite  muscle or joint pain  nausea, vomiting  pain, redness, or irritation at site where injected  tiredness This list may not describe all possible side effects. Call your doctor for medical advice about  side effects. You may report side effects to FDA at 1-800-FDA-1088. Where should I keep my medicine? This drug is given in a hospital or clinic and will not be stored at home. NOTE: This sheet is a summary. It may not cover all possible information. If you have questions about this medicine, talk to your doctor, pharmacist, or health care provider.  2021 Elsevier/Gold Standard (2019-08-13 13:37:23)  

## 2021-01-17 NOTE — Progress Notes (Signed)
Nutrition Follow-up:  Patient with triple negative breast cancer.  Patient s/p mastectomy.  Patient has completed adriamycin/cytoxan.  Receiving taxol.   Met with patient during infusion.  Patient reports that her appetite is much better.  Cooking a little bit more (crockpot meals).  Has cut back in drinking chocolate milk.  Denies nausea.  Still having a hard time getting back to drinking water.  Previously water made her nauseated.      Medications: reviewed  Labs: reviewed  Anthropometrics:   Weight 101 lb 9.6 oz today  100 lb on 4/4 100 lb 9.6 oz on 3/7 100 lb on 2/14 102 lb on 12/17 104 lb on 12/6   NUTRITION DIAGNOSIS: Inadequate oral intake continues   INTERVENTION:  Patient to continue to eat well balanced diet to maintain weight during treatment. Contact information given to patient and patient will contact RD if needed    NEXT VISIT: no follow-up Patient will contact if needed  Beth Wells, [place], [place] Registered Dietitian [phone] (mobile)

## 2021-01-18 NOTE — Progress Notes (Signed)
[facility] NOTE  Patient Care Team: [place], [name], [name]  CHIEF COMPLAINTS/PURPOSE OF CONSULTATION: Breast cancer  #  Oncology History Overview Note  # OCT 2021-RIGHT BREAST- TRIPLE NEGATIVE [place]; [US/mammo-49m]; Dr.Cintron; OCT 2021-USThere is a 7 mm mass in the right breast at 10 o'clock, favored to represent a complicated cyst;  No evidence of right axillary lymphadenopathy].   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 [Dr.Shah]- skin biopsy- on cymblata+ Lyrica  # DEC 2021-status post evaluation with reproductive endocrinology [9-10% chance of viable pregnancy] # # SURVIVORSHIP:   # GENETICS: BARD  DIAGNOSIS: Breast cancer-triple negative  STAGE:  I       ;  GOALS: cure  CURRENT/MOST RECENT THERAPY : AC-T    Carcinoma of upper-outer quadrant of right breast in [place], estrogen receptor negative [place] 07[place] Initial Diagnosis   Carcinoma of upper-outer quadrant of right breast in female, estrogen receptor negative ([facility]   Genetic Testing   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 The report date is 08/02/2020.  The Multi-Cancer Panel offered by [facility] includes sequencing and/or deletion duplication testing of the following 85 genes: AIP, ALK, APC, ATM, AXIN2,BAP1,  BARD1, BLM, BMPR1A, BRCA1, BRCA2, BRIP1, CASR, CDC73, CDH1, CDK4, CDKN1B, CDKN1C, CDKN2A (p14ARF), CDKN2A (p16INK4a), CEBPA, CHEK2, CTNNA1, DICER1, DIS3L2, EGFR (c.2369C>T, p.Thr790Met variant only), EPCAM (Deletion/duplication testing only), FH, FLCN, GATA2, GPC3, GREM1 (Promoter region deletion/duplication testing only), HOXB13 (c.251G>A, p.Gly84Glu), HRAS, KIT, MAX, MEN1, MET, MITF (c.952G>A, p.Glu318Lys variant  only), MLH1, MSH2, MSH3, MSH6, MUTYH, NBN, NF1, NF2, NTHL1, PALB2, PDGFRA, PHOX2B, PMS2, POLD1, POLE, POT1, PRKAR1A, PTCH1, PTEN, RAD50, RAD51C, RAD51D, RB1, RECQL4, RET, RNF43, RUNX1, SDHAF2, SDHA (sequence changes only), SDHB, SDHC, SDHD, SMAD4, SMARCA4, SMARCB1, SMARCE1, STK11, SUFU, TERC, TERT, TMEM127, TP53, TSC1, TSC2, VHL, WRN and WT1.    09/10/2020 -  Chemotherapy    Patient is on Treatment Plan: BREAST ADJUVANT DOSE DENSE AC Q14D / PACLITAXEL Q7D         HISTORY OF PRESENTING ILLNESS:  Beth [place]on89.o.  female   with stage I triple negative breast cancer currently currently on adjuvant Taxol is here for follow-up.  Patient received cycle #5 last week; after using Granix.  Patient complains of worsening tingling and numbness extremities/also flareup of her small fiber neuropathy in the body.  Denies any fevers or chills.  Complains of a rash on her extremities/legs mildly itchy overall improving.  Review of Systems  Constitutional: Positive for malaise/fatigue. Negative for chills, diaphoresis, fever and weight loss.  HENT: Negative for nosebleeds and sore throat.   Eyes: Negative for double vision.  Respiratory: Negative for cough, hemoptysis, sputum production, shortness of breath and wheezing.   Cardiovascular: Negative for chest pain, palpitations, orthopnea and leg swelling.  Gastrointestinal: Negative for abdominal pain, blood in stool, diarrhea, heartburn, melena and vomiting.  Genitourinary: Negative for dysuria, frequency and urgency.  Musculoskeletal: Negative for back pain and joint pain.  Skin: Positive for itching and rash.  Neurological: Positive for tingling. Negative for dizziness, focal weakness, weakness and headaches.  Endo/Heme/Allergies: Does not bruise/bleed easily.  Psychiatric/Behavioral: Negative for depression. The patient is not nervous/anxious and does not have insomnia.      MEDICAL HISTORY:  Past  Medical History:  Diagnosis Date  .  Breast cancer ([place]) 08/05/2020  . Carcinoma of upper-outer quadrant of right breast in female, estrogen receptor negative ([place]) 07/13/2020  . Complication of anesthesia    hard to wake up-very dizzy feeling like she was going to pass out  . GERD (gastroesophageal reflux disease)    no meds  . H/O foot surgery 08/2018  . History of kidney stones    h/o  . Hypertension   . PONV (postoperative nausea and vomiting)    n/v aftter kidney stone surgery    SURGICAL HISTORY: Past Surgical History:  Procedure Laterality Date  . BREAST BIOPSY Right 07/06/2020   u/s bx Q clip path pending  . BREAST RECONSTRUCTION WITH PLACEMENT OF TISSUE EXPANDER AND FLEX HD (ACELLULAR HYDRATED DERMIS) Right 08/05/2020   Procedure: RIGHT BREAST RECONSTRUCTION WITH PLACEMENT OF TISSUE EXPANDER AND FLEX HD (ACELLULAR HYDRATED DERMIS);  Surgeon: Wallace Going, DO;  Location: [facility] ORS;  Service: Plastics;  Laterality: Right;  . BUNIONECTOMY Right    titanium pin  . CERVICAL CONE BIOPSY    . DILATION AND EVACUATION N/A 03/12/2018   Procedure: DILATATION AND EVACUATION;  Surgeon: [name], MD;  Location: [facility] OR[facility];  Service: Gynecology;  Laterality: N/A;  . KIDNEY STONE SURGERY    . LAPAROSCOPIC OVARIAN CYSTECTOMY Right 06/07/2019   Procedure: LAPAROSCOPIC OVARIAN CYSTECTOMY;  Surgeon: [name], MD;  Location: [facility];  Service: Gynecology;  Laterality: Right;  . LAPAROSCOPY N/A 06/07/2019   Procedure: LAPAROSCOPY Luvenia Redden BIOPSIES;  Surgeon: [name], MD;  Location: [facility];  Service: Gynecology;  Laterality: N/A;  . PORTACATH PLACEMENT Left 08/05/2020   Procedure: INSERTION PORT-A-CATH;  Surgeon: Herbert Pun, MD;  Location: [facility];  Service: General;  Laterality: Left;  . TOTAL MASTECTOMY Right 08/05/2020   Procedure: TOTAL MASTECTOMY w/ Sentinel Node;  Surgeon: Herbert Pun, MD;  Location: [facility];  Service: General;  Laterality: Right;    SOCIAL  HISTORY: Social History   Socioeconomic History  . Marital status: Married    Spouse name: [name]  . Number of children: 0  . Years of education: Not on file  . Highest education level: Not on file  Occupational History  . Occupation: Market researcher: [employer]  Tobacco Use  . Smoking status: Never Smoker  . Smokeless tobacco: Never Used  Vaping Use  . Vaping Use: Never used  Substance and Sexual Activity  . Alcohol use: Not Currently  . Drug use: Never  . Sexual activity: Yes    Birth control/protection: Condom  Other Topics Concern  . 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 Lives in [place].    Social Determinants of Health   Financial Resource Strain: Low Risk   . Difficulty of Paying Living Expenses: Not hard at all  Food Insecurity: Not on file  Transportation Needs: No Transportation Needs  . Lack of Transportation (Medical): No  . Lack of Transportation (Non-Medical): No  Physical Activity: Not on file  Stress: Stress Concern Present  . Feeling of Stress : Very much  Social Connections: Not on file  Intimate Partner Violence: Not on file    FAMILY HISTORY: Family History  Problem Relation Age of Onset  . Cancer Other        great aunt- GI cancer  . Breast cancer Neg Hx     ALLERGIES:  has No Known Allergies.  MEDICATIONS:  Current Outpatient Medications  Medication Sig Dispense Refill  . acetaminophen (TYLENOL) 500 MG tablet Take 1 tablet (500 mg total) by mouth every 6 (six) hours as needed. For use AFTER surgery 30 tablet 0  . ALPHA-LIPOIC ACID PO Take 500 mg by mouth daily.     Marland Kitchen amphetamine-dextroamphetamine (ADDERALL XR) 30 MG 24 hr capsule Take 30 mg by mouth daily.     . DPH-Lido-AlHydr-MgHydr-Simeth (FIRST-MOUTHWASH BLM) SUSP     . DULoxetine (CYMBALTA) 20 MG capsule Take 2 capsules (40 mg total) by mouth at bedtime. (Patient taking  differently: Take 20 mg by mouth at bedtime.) 60 capsule 2  . lidocaine-prilocaine (EMLA) cream Apply 1 application topically as needed. On the port generously; 30-45 mins prior to chemo appt. 30 g 3  . lisinopril (ZESTRIL) 5 MG tablet Take 2.5 mg by mouth every evening.    . magic mouthwash w/lidocaine SOLN Take 5 mLs by mouth. 80 ml viscous lidocaine 2%, 80 ml Mylanta, 80 ml Diphenhydramine 12.5 mg/5 ml Elixir, 80 ml Nystatin 100,000 Unit suspension, 80 ml Prednisolone 15 mg/36m, 80 ml Distilled Water. Sig: Swish/Swallow 5-10 ml four times a day as needed. Dispense 480 ml. 3RFs    . ondansetron (ZOFRAN) 8 MG tablet One pill every 8 hours as needed for nausea/vomitting. 40 tablet 3  . pregabalin (LYRICA) 100 MG capsule Take 1 capsule (100 mg total) by mouth 2 (two) times daily. 180 capsule 1  . Probiotic Product (PROBIOTIC ADVANCED PO) Take 1 capsule by mouth daily.     . prochlorperazine (COMPAZINE) 10 MG tablet Take 1 tablet (10 mg total) by mouth every 6 (six) hours as needed for nausea or vomiting. 40 tablet 3  . sodium fluoride (FLUORISHIELD) 1.1 % GEL dental gel Place 1 application onto teeth 2 (two) times daily.    . SODIUM FLUORIDE 5000 PPM 1.1 % PSTE Take by mouth.    . traMADol (ULTRAM) 50 MG tablet Take 1-2 tablets (50-100 mg total) by mouth 2 (two) times daily as needed for severe pain. 60 tablet 1  . trolamine salicylate (ASPERCREME) 10 % cream Apply 1 application topically as needed for muscle pain.     No current facility-administered medications for this visit.      .Marland Kitchen PHYSICAL EXAMINATION: ECOG PERFORMANCE STATUS: 0 - Asymptomatic  There were no vitals filed for this visit. Filed Weights   01/17/21 0934  Weight: 101 lb 6 oz (46 kg)    Physical Exam Constitutional:      Comments: Alone; ambulating independently.   HENT:     Head: Normocephalic and atraumatic.     Mouth/Throat:     Pharynx: No oropharyngeal exudate.  Eyes:     Pupils: Pupils are equal, round, and  reactive to light.  Cardiovascular:     Rate and Rhythm: Normal rate and regular rhythm.  Pulmonary:     Effort: Pulmonary effort is normal. No respiratory distress.     Breath sounds: Normal breath sounds. No wheezing.  Abdominal:     General: Bowel sounds are normal. There is no distension.     Palpations: Abdomen is soft. There is no mass.     Tenderness: There is no abdominal tenderness. There is no guarding or rebound.  Musculoskeletal:        General: No tenderness. Normal range of motion.     Cervical back: Normal range of motion and neck supple.  Skin:    General: Skin is warm.  Comments: Faint maculopapular rash noted on the lower extremities.  Neurological:     Mental Status: She is alert and oriented to person, place, and time.  Psychiatric:        Mood and Affect: Affect normal.      LABORATORY DATA:  I have reviewed the data as listed Lab Results  Component Value Date   WBC 5.1 01/17/2021   HGB 11.1 (L) 01/17/2021   HCT 33.7 (L) 01/17/2021   MCV 93.1 01/17/2021   PLT 259 01/17/2021   Recent Labs    01/03/21 0842 01/10/21 0940 01/17/21 0850  NA 137 137 140  K 3.8 3.6 3.8  CL 104 104 105  CO2 _0 GLUCOSE 103* 112* 94  BUN 26* 19 26*  CREATININE 0.82 0.83 0.96  CALCIUM 9.0 9.3 9.2  GFRNONAA >60 >60 >60  PROT 7.0 6.9 6.9  ALBUMIN 4.0 3.8 4.0  AST _1 ALT _2 ALKPHOS 55 56 70  BILITOT 0.4 0.5 0.3    RADIOGRAPHIC STUDIES: I have personally reviewed the radiological images as listed and agreed with the findings in the report. No results found.  ASSESSMENT & PLAN:   Carcinoma of upper-outer quadrant of right breast in female, estrogen receptor negative ([place]) # Stage I triple negative breast cancer on adjuvant AC- Taxol chemo.  Tolerating chemotherapy with moderate side effects [small fibre neuropathy-se below S/p 4 cycles of AC; currently on Taxol-3 weeks on 1 week off [sec to PN].   #Proceed with Taxol#6 today [60 mg/m2] Labs  today reviewed;  acceptable for treatment today. Tolerating with  moderate worse- PN see below.   Discussed that most of the adjuvant benefit would be from Adriamycin Cytoxan which she finished 4 cycles.  I had a long discussion with patient regarding discontinuation of Taxol-given her progressive worsening neuropathy/especially in context of her stage I breast cancer.  Patient is quite obviously anxious given the above complicated situation.  # Neutropenia-secondary to chemotherapy-ANC 0.8 [4/11]-plan adding Granix post chemo x 2 days.   # Small fiber neuropathy- [Dr.Shah]-on Cymbalta/Lyrica-worsening-[ Lower-dose of Taxol; and also 3 weeks on 1 week off]; pain clinic-tramadol prn Dr.Lateef.  See above-discontinue Taxol  # Fertility preserving- zoladex monthly plan 4/184]; hot flashes- STABLE.   # DISPOSITION:  # taxol as planned # as planned-  4/28& 4/29- zarxio  # follow up as planned in 1 weeks- MD; labs- cbc/cmp; Taxol - 5/5 & 5/6- zarxio;  -Dr.B   All questions were answered. The patient/family knows to call the clinic with any problems, questions or concerns.    Cammie Sickle, MD 01/18/2021 7:43 AM

## 2021-01-19 ENCOUNTER — Inpatient Hospital Stay (HOSPITAL_BASED_OUTPATIENT_CLINIC_OR_DEPARTMENT_OTHER): Payer: BC Managed Care – PPO | Admitting: Internal Medicine

## 2021-01-19 ENCOUNTER — Other Ambulatory Visit: Payer: Self-pay

## 2021-01-19 DIAGNOSIS — C50411 Malignant neoplasm of upper-outer quadrant of right female breast: Secondary | ICD-10-CM

## 2021-01-19 DIAGNOSIS — Z171 Estrogen receptor negative status [ER-]: Secondary | ICD-10-CM | POA: Diagnosis not present

## 2021-01-19 MED ORDER — OXYCODONE HCL 5 MG PO TABS: 5.0000 mg | ORAL_TABLET | ORAL | 0 refills | Status: DC | PRN

## 2021-01-19 MED ORDER — METHYLPREDNISOLONE 4 MG PO TBPK: ORAL_TABLET | ORAL | 1 refills | Status: DC

## 2021-01-19 NOTE — Assessment & Plan Note (Addendum)
#   Stage I triple negative breast cancer on adjuvant chemotherapy with s/p 4 cycles of AC; Taxol s/p 6 treatments.  Tolerating poorly because of worsening neuropathy [see below].  #Recommend stopping/discontinuation of further chemotherapy given the significant worsening of the neuropathy, the risk of continued neuropathy outpatient benefits. Patient finally is in agreement.   # Neutropenia-secondary to chemotherapy-ANC 0.8 [4/11]-plan adding Granix post chemo x 2 days.   # Small fiber neuropathy- [Dr.Shah]-a-worsening-[Lower-dose of Taxol; and also 3 weeks on 1 week off] ; will discontinue further chemotherapy.  Patient given the script for oxycodone 5mg  every 4-6 hours; medrol dose pack; cymblata 40 mg x3 days;  lyrica 200 midAM; 100 at night.  And also use udder cream.  # Fertility preserving- zoladex monthly plan 4/184]; hot flashes- STABLE.   # DISPOSITION:  # as planned-  4/28& 4/29- zarxio  # follow up as planned in 1 weeks- MD; labs- cbc/cmp; CANCEL-Taxol & Zarxio- 5/5 & 5/6 -Dr.B

## 2021-01-19 NOTE — Progress Notes (Signed)
[facility] [place] NOTE  Patient Care Team: [place], [name], [name]  CHIEF COMPLAINTS/PURPOSE OF CONSULTATION: Breast cancer  #  Oncology History Overview Note  # OCT 2021-RIGHT BREAST- TRIPLE NEGATIVE [place]; [US/mammo-69mm]; Dr.Cintron; OCT 2021-USThere is a 7 mm mass in the right breast at 10 o'clock, favored to represent a complicated cyst;  No evidence of right axillary lymphadenopathy].   # NOV 2021- Stage I pT1bp N0 [s/p mastectomy; Dr.Cintron]; G-3;   # DEC 17th, 2021- AC x4; 11/29/2020- Taxol weekly [$RemoveBefor'60mg'xcvvqAQbCEcD$ /m2-3 W-ON & 1 W-OFF sec to PN]  # 2018-Small Fibre neuropathy [Dr.Shah]- skin biopsy- on cymblata+ Lyrica  # DEC 2021-status post evaluation with reproductive endocrinology [9-10% chance of viable pregnancy] # # SURVIVORSHIP:   # GENETICS: BARD  DIAGNOSIS: Breast cancer-triple negative  STAGE:  I       ;  GOALS: cure  CURRENT/MOST RECENT THERAPY : AC-T    Carcinoma of upper-outer quadrant of right breast in female, estrogen receptor negative ([place])  07/13/2020 Initial Diagnosis   Carcinoma of upper-outer quadrant of right breast in female, estrogen receptor negative ([place])    Genetic Testing   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 The report date is 08/02/2020.  The Multi-Cancer Panel offered by [facility] includes sequencing and/or deletion duplication testing of the following 85 genes: AIP, ALK, APC, ATM, AXIN2,BAP1,  BARD1, BLM, BMPR1A, BRCA1, BRCA2, BRIP1, CASR, CDC73, CDH1, CDK4, CDKN1B, CDKN1C, CDKN2A (p14ARF), CDKN2A (p16INK4a), CEBPA, CHEK2, CTNNA1, DICER1, DIS3L2, EGFR (c.2369C>T, p.Thr790Met variant only), EPCAM (Deletion/duplication testing only), FH, FLCN, GATA2, GPC3, GREM1 (Promoter region deletion/duplication testing only), HOXB13 (c.251G>A, p.Gly84Glu), HRAS, KIT, MAX, MEN1, MET, MITF (c.952G>A, p.Glu318Lys variant  only), MLH1, MSH2, MSH3, MSH6, MUTYH, NBN, NF1, NF2, NTHL1, PALB2, PDGFRA, PHOX2B, PMS2, POLD1, POLE, POT1, PRKAR1A, PTCH1, PTEN, RAD50, RAD51C, RAD51D, RB1, RECQL4, RET, RNF43, RUNX1, SDHAF2, SDHA (sequence changes only), SDHB, SDHC, SDHD, SMAD4, SMARCA4, SMARCB1, SMARCE1, STK11, SUFU, TERC, TERT, TMEM127, TP53, TSC1, TSC2, VHL, WRN and WT1.    09/10/2020 -  Chemotherapy    Patient is on Treatment Plan: BREAST ADJUVANT DOSE DENSE AC Q14D / PACLITAXEL Q7D         HISTORY OF PRESENTING ILLNESS:  Beth Wells 45 y.o.  female   with stage I triple negative breast cancer currently currently on adjuvant Taxol is here for follow-up.  Patient received cycle #6  Taxol 2 days ago after much deliberation given to her neuropathy.  Patient noted to have significant worsening of neuropathy over the last 2 nights.  Difficulty sleeping at night she has been using ice; she also took 1 oxycodone.  She has been taking 20 mg of Cymbalta at night/and Lyrica 100 in midmorning; 100 at night.   Review of Systems  Constitutional: Positive for malaise/fatigue. Negative for chills, diaphoresis, fever and weight loss.  HENT: Negative for nosebleeds and sore throat.   Eyes: Negative for double vision.  Respiratory: Negative for cough, hemoptysis, sputum production, shortness of breath and wheezing.   Cardiovascular: Negative for chest pain, palpitations, orthopnea and leg swelling.  Gastrointestinal: Negative for abdominal pain, blood in stool, diarrhea, heartburn, melena and vomiting.  Genitourinary: Negative for dysuria, frequency and urgency.  Musculoskeletal: Negative for back pain and joint pain.  Skin: Positive for itching and rash.  Neurological: Positive for tingling. Negative for dizziness, focal weakness, weakness and headaches.  Endo/Heme/Allergies: Does not bruise/bleed easily.  Psychiatric/Behavioral: Negative for depression.  The patient is not nervous/anxious and does not have insomnia.       MEDICAL HISTORY:  Past Medical History:  Diagnosis Date  . Breast cancer ([place]) 08/05/2020  . Carcinoma of upper-outer quadrant of right breast in female, estrogen receptor negative ([place]) 07/13/2020  . Complication of anesthesia    hard to wake up-very dizzy feeling like she was going to pass out  . GERD (gastroesophageal reflux disease)    no meds  . H/O foot surgery 08/2018  . History of kidney stones    h/o  . Hypertension   . PONV (postoperative nausea and vomiting)    n/v aftter kidney stone surgery    SURGICAL HISTORY: Past Surgical History:  Procedure Laterality Date  . BREAST BIOPSY Right 07/06/2020   u/s bx Q clip path pending  . BREAST RECONSTRUCTION WITH PLACEMENT OF TISSUE EXPANDER AND FLEX HD (ACELLULAR HYDRATED DERMIS) Right 08/05/2020   Procedure: RIGHT BREAST RECONSTRUCTION WITH PLACEMENT OF TISSUE EXPANDER AND FLEX HD (ACELLULAR HYDRATED DERMIS);  Surgeon: Wallace Going, DO;  Location: [facility];  Service: Plastics;  Laterality: Right;  . BUNIONECTOMY Right    titanium pin  . CERVICAL CONE BIOPSY    . DILATION AND EVACUATION N/A 03/12/2018   Procedure: DILATATION AND EVACUATION;  Surgeon: [name], MD;  Location: [facility];  Service: Gynecology;  Laterality: N/A;  . KIDNEY STONE SURGERY    . LAPAROSCOPIC OVARIAN CYSTECTOMY Right 06/07/2019   Procedure: LAPAROSCOPIC OVARIAN CYSTECTOMY;  Surgeon: [name], MD;  Location: [facility];  Service: Gynecology;  Laterality: Right;  . LAPAROSCOPY N/A 06/07/2019   Procedure: LAPAROSCOPY Luvenia Redden BIOPSIES;  Surgeon: [name], MD;  Location: [facility];  Service: Gynecology;  Laterality: N/A;  . PORTACATH PLACEMENT Left 08/05/2020   Procedure: INSERTION PORT-A-CATH;  Surgeon: Herbert Pun, MD;  Location: [facility];  Service: General;  Laterality: Left;  . TOTAL MASTECTOMY Right 08/05/2020   Procedure: TOTAL MASTECTOMY w/ Sentinel Node;  Surgeon: Herbert Pun, MD;   Location: [facility];  Service: General;  Laterality: Right;    SOCIAL HISTORY: Social History   Socioeconomic History  . Marital status: Married    Spouse name: [name]  . Number of children: 0  . Years of education: Not on file  . Highest education level: Not on file  Occupational History  . Occupation: Market researcher: [employer]  Tobacco Use  . Smoking status: Never Smoker  . Smokeless tobacco: Never Used  Vaping Use  . Vaping Use: Never used  Substance and Sexual Activity  . Alcohol use: Not Currently  . Drug use: Never  . Sexual activity: Yes    Birth control/protection: Condom  Other Topics Concern  . 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 Lives in [place].    Social Determinants of Health   Financial Resource Strain: Low Risk   . Difficulty of Paying Living Expenses: Not hard at all  Food Insecurity: Not on file  Transportation Needs: No Transportation Needs  . Lack of Transportation (Medical): No  . Lack of Transportation (Non-Medical): No  Physical Activity: Not on file  Stress: Stress Concern Present  . Feeling of Stress : Very much  Social Connections: Not on file  Intimate Partner Violence: Not on file    FAMILY HISTORY: Family History  Problem Relation Age of Onset  . Cancer Other        great  aunt- GI cancer  . Breast cancer Neg Hx     ALLERGIES:  has No Known Allergies.  MEDICATIONS:  Current Outpatient Medications  Medication Sig Dispense Refill  . acetaminophen (TYLENOL) 500 MG tablet Take 1 tablet (500 mg total) by mouth every 6 (six) hours as needed. For use AFTER surgery 30 tablet 0  . ALPHA-LIPOIC ACID PO Take 500 mg by mouth daily.     Marland Kitchen amphetamine-dextroamphetamine (ADDERALL XR) 30 MG 24 hr capsule Take 30 mg by mouth daily.     . DPH-Lido-AlHydr-MgHydr-Simeth (FIRST-MOUTHWASH BLM) SUSP     . DULoxetine (CYMBALTA) 20 MG  capsule Take 2 capsules (40 mg total) by mouth at bedtime. (Patient taking differently: Take 20 mg by mouth at bedtime.) 60 capsule 2  . lidocaine-prilocaine (EMLA) cream Apply 1 application topically as needed. On the port generously; 30-45 mins prior to chemo appt. 30 g 3  . lisinopril (ZESTRIL) 5 MG tablet Take 2.5 mg by mouth every evening.    . magic mouthwash w/lidocaine SOLN Take 5 mLs by mouth. 80 ml viscous lidocaine 2%, 80 ml Mylanta, 80 ml Diphenhydramine 12.5 mg/5 ml Elixir, 80 ml Nystatin 100,000 Unit suspension, 80 ml Prednisolone 15 mg/59m, 80 ml Distilled Water. Sig: Swish/Swallow 5-10 ml four times a day as needed. Dispense 480 ml. 3RFs    . methylPREDNISolone (MEDROL DOSEPAK) 4 MG TBPK tablet Use as directed. 21 tablet 1  . ondansetron (ZOFRAN) 8 MG tablet One pill every 8 hours as needed for nausea/vomitting. 40 tablet 3  . oxyCODONE (OXY IR/ROXICODONE) 5 MG immediate release tablet Take 1 tablet (5 mg total) by mouth every 4 (four) hours as needed for severe pain. 30 tablet 0  . pregabalin (LYRICA) 100 MG capsule Take 1 capsule (100 mg total) by mouth 2 (two) times daily. 180 capsule 1  . Probiotic Product (PROBIOTIC ADVANCED PO) Take 1 capsule by mouth daily.     . prochlorperazine (COMPAZINE) 10 MG tablet Take 1 tablet (10 mg total) by mouth every 6 (six) hours as needed for nausea or vomiting. 40 tablet 3  . sodium fluoride (FLUORISHIELD) 1.1 % GEL dental gel Place 1 application onto teeth 2 (two) times daily.    . SODIUM FLUORIDE 5000 PPM 1.1 % PSTE Take by mouth.    . traMADol (ULTRAM) 50 MG tablet Take 1-2 tablets (50-100 mg total) by mouth 2 (two) times daily as needed for severe pain. 60 tablet 1  . trolamine salicylate (ASPERCREME) 10 % cream Apply 1 application topically as needed for muscle pain.     No current facility-administered medications for this visit.      .Marland Kitchen PHYSICAL EXAMINATION: ECOG PERFORMANCE STATUS: 0 - Asymptomatic  Vitals:   01/19/21 1038   BP: (!) 135/96  Pulse: 87  Resp: 20  Temp: (!) 97.5 F (36.4 C)   Filed Weights   01/19/21 1038  Weight: 102 lb 11.2 oz (46.6 kg)    Physical Exam Constitutional:      Comments: Alone; ambulating independently.   HENT:     Head: Normocephalic and atraumatic.     Mouth/Throat:     Pharynx: No oropharyngeal exudate.  Eyes:     Pupils: Pupils are equal, round, and reactive to light.  Cardiovascular:     Rate and Rhythm: Normal rate and regular rhythm.  Pulmonary:     Effort: Pulmonary effort is normal. No respiratory distress.     Breath sounds: Normal breath sounds. No wheezing.  Abdominal:  General: Bowel sounds are normal. There is no distension.     Palpations: Abdomen is soft. There is no mass.     Tenderness: There is no abdominal tenderness. There is no guarding or rebound.  Musculoskeletal:        General: No tenderness. Normal range of motion.     Cervical back: Normal range of motion and neck supple.  Skin:    General: Skin is warm.     Comments: Faint maculopapular rash noted on the lower extremities.  Neurological:     Mental Status: She is alert and oriented to person, place, and time.  Psychiatric:        Mood and Affect: Affect normal.      LABORATORY DATA:  I have reviewed the data as listed Lab Results  Component Value Date   WBC 5.1 01/17/2021   HGB 11.1 (L) 01/17/2021   HCT 33.7 (L) 01/17/2021   MCV 93.1 01/17/2021   PLT 259 01/17/2021   Recent Labs    01/03/21 0842 01/10/21 0940 01/17/21 0850  NA 137 137 140  K 3.8 3.6 3.8  CL 104 104 105  CO2 _0 GLUCOSE 103* 112* 94  BUN 26* 19 26*  CREATININE 0.82 0.83 0.96  CALCIUM 9.0 9.3 9.2  GFRNONAA >60 >60 >60  PROT 7.0 6.9 6.9  ALBUMIN 4.0 3.8 4.0  AST _1 ALT _2 ALKPHOS 55 56 70  BILITOT 0.4 0.5 0.3    RADIOGRAPHIC STUDIES: I have personally reviewed the radiological images as listed and agreed with the findings in the report. No results  found.  ASSESSMENT & PLAN:   Carcinoma of upper-outer quadrant of right breast in female, estrogen receptor negative ([place]) # Stage I triple negative breast cancer on adjuvant chemotherapy with s/p 4 cycles of AC; Taxol s/p 6 treatments.  Tolerating poorly because of worsening neuropathy [see below].  #Recommend stopping/discontinuation of further chemotherapy given the significant worsening of the neuropathy, the risk of continued neuropathy outpatient benefits. Patient finally is in agreement.   # Neutropenia-secondary to chemotherapy-ANC 0.8 [4/11]-plan adding Granix post chemo x 2 days.   # Small fiber neuropathy- [Dr.Shah]-a-worsening-[Lower-dose of Taxol; and also 3 weeks on 1 week off] ; will discontinue further chemotherapy.  Patient given the script for oxycodone 43m every 4-6 hours; medrol dose pack; cymblata 40 mg x3 days;  lyrica 200 midAM; 100 at night.  And also use udder cream.  # Fertility preserving- zoladex monthly plan 4/184]; hot flashes- STABLE.   # DISPOSITION:  # as planned-  4/28& 4/29- zarxio  # follow up as planned in 1 weeks- MD; labs- cbc/cmp; CANCEL-Taxol & Zarxio- 5/5 & 5/6 -Dr.B   All questions were answered. The patient/family knows to call the clinic with any problems, questions or concerns.    GCammie Sickle MD 01/19/2021 12:53 PM

## 2021-01-19 NOTE — Patient Instructions (Signed)
#  Start Medrol Dosepak as directed #Take Lyrica 2 pills midday; 1 pill at night #Take Cymbalta 2 pills at night for the next 3 days; if improvement noted then 1 pill at night #Take oxycodone every 4-6 hours as needed.

## 2021-01-20 ENCOUNTER — Other Ambulatory Visit: Payer: Self-pay

## 2021-01-20 ENCOUNTER — Inpatient Hospital Stay: Payer: BC Managed Care – PPO

## 2021-01-20 DIAGNOSIS — C50411 Malignant neoplasm of upper-outer quadrant of right female breast: Secondary | ICD-10-CM

## 2021-01-20 MED ORDER — FILGRASTIM-SNDZ 300 MCG/0.5ML IJ SOSY
300.0000 ug | PREFILLED_SYRINGE | Freq: Once | INTRAMUSCULAR | Status: AC
Start: 2021-01-20 — End: 2021-01-20
  Administered 2021-01-20: 300 ug via SUBCUTANEOUS
  Filled 2021-01-20: qty 0.5

## 2021-01-21 ENCOUNTER — Inpatient Hospital Stay: Payer: BC Managed Care – PPO

## 2021-01-21 DIAGNOSIS — C50411 Malignant neoplasm of upper-outer quadrant of right female breast: Secondary | ICD-10-CM | POA: Diagnosis not present

## 2021-01-21 DIAGNOSIS — Z171 Estrogen receptor negative status [ER-]: Secondary | ICD-10-CM

## 2021-01-21 MED ORDER — FILGRASTIM-SNDZ 300 MCG/0.5ML IJ SOSY
300.0000 ug | PREFILLED_SYRINGE | Freq: Once | INTRAMUSCULAR | Status: AC
  Administered 2021-01-21: 300 ug via SUBCUTANEOUS
  Filled 2021-01-21: qty 0.5

## 2021-01-24 ENCOUNTER — Inpatient Hospital Stay: Payer: BC Managed Care – PPO

## 2021-01-24 ENCOUNTER — Inpatient Hospital Stay: Payer: BC Managed Care – PPO | Attending: Internal Medicine

## 2021-01-24 ENCOUNTER — Telehealth: Payer: Self-pay | Admitting: Student in an Organized Health Care Education/Training Program

## 2021-01-24 ENCOUNTER — Inpatient Hospital Stay (HOSPITAL_BASED_OUTPATIENT_CLINIC_OR_DEPARTMENT_OTHER): Payer: BC Managed Care – PPO | Admitting: Internal Medicine

## 2021-01-24 ENCOUNTER — Encounter: Payer: Self-pay | Admitting: Internal Medicine

## 2021-01-24 DIAGNOSIS — Z1502 Genetic susceptibility to malignant neoplasm of ovary: Secondary | ICD-10-CM | POA: Insufficient documentation

## 2021-01-24 DIAGNOSIS — R232 Flushing: Secondary | ICD-10-CM | POA: Diagnosis not present

## 2021-01-24 DIAGNOSIS — C50411 Malignant neoplasm of upper-outer quadrant of right female breast: Secondary | ICD-10-CM | POA: Diagnosis not present

## 2021-01-24 DIAGNOSIS — I10 Essential (primary) hypertension: Secondary | ICD-10-CM | POA: Insufficient documentation

## 2021-01-24 DIAGNOSIS — Z171 Estrogen receptor negative status [ER-]: Secondary | ICD-10-CM

## 2021-01-24 DIAGNOSIS — Z148 Genetic carrier of other disease: Secondary | ICD-10-CM | POA: Diagnosis not present

## 2021-01-24 DIAGNOSIS — Z9011 Acquired absence of right breast and nipple: Secondary | ICD-10-CM | POA: Diagnosis not present

## 2021-01-24 DIAGNOSIS — K219 Gastro-esophageal reflux disease without esophagitis: Secondary | ICD-10-CM | POA: Diagnosis not present

## 2021-01-24 DIAGNOSIS — R5383 Other fatigue: Secondary | ICD-10-CM | POA: Diagnosis not present

## 2021-01-24 DIAGNOSIS — R5381 Other malaise: Secondary | ICD-10-CM | POA: Diagnosis not present

## 2021-01-24 DIAGNOSIS — Z79899 Other long term (current) drug therapy: Secondary | ICD-10-CM | POA: Insufficient documentation

## 2021-01-24 DIAGNOSIS — G894 Chronic pain syndrome: Secondary | ICD-10-CM | POA: Insufficient documentation

## 2021-01-24 DIAGNOSIS — Z9221 Personal history of antineoplastic chemotherapy: Secondary | ICD-10-CM | POA: Diagnosis not present

## 2021-01-24 LAB — CBC WITH DIFFERENTIAL/PLATELET
Abs Immature Granulocytes: 0.23 10*3/uL — ABNORMAL HIGH (ref 0.00–0.07)
Basophils Absolute: 0 10*3/uL (ref 0.0–0.1)
Basophils Relative: 1 %
Eosinophils Absolute: 0.1 10*3/uL (ref 0.0–0.5)
Eosinophils Relative: 2 %
HCT: 37.6 % (ref 36.0–46.0)
Hemoglobin: 12.2 g/dL (ref 12.0–15.0)
Immature Granulocytes: 3 %
Lymphocytes Relative: 17 %
Lymphs Abs: 1.3 10*3/uL (ref 0.7–4.0)
MCH: 30.7 pg (ref 26.0–34.0)
MCHC: 32.4 g/dL (ref 30.0–36.0)
MCV: 94.5 fL (ref 80.0–100.0)
Monocytes Absolute: 0.4 10*3/uL (ref 0.1–1.0)
Monocytes Relative: 6 %
Neutro Abs: 5.4 10*3/uL (ref 1.7–7.7)
Neutrophils Relative %: 71 %
Platelets: 298 10*3/uL (ref 150–400)
RBC: 3.98 MIL/uL (ref 3.87–5.11)
RDW: 14.4 % (ref 11.5–15.5)
WBC: 7.5 10*3/uL (ref 4.0–10.5)
nRBC: 0 % (ref 0.0–0.2)

## 2021-01-24 LAB — COMPREHENSIVE METABOLIC PANEL
ALT: 26 U/L (ref 0–44)
AST: 17 U/L (ref 15–41)
Albumin: 4 g/dL (ref 3.5–5.0)
Alkaline Phosphatase: 98 U/L (ref 38–126)
Anion gap: 10 (ref 5–15)
BUN: 27 mg/dL — ABNORMAL HIGH (ref 6–20)
CO2: 26 mmol/L (ref 22–32)
Calcium: 9.5 mg/dL (ref 8.9–10.3)
Chloride: 102 mmol/L (ref 98–111)
Creatinine, Ser: 0.79 mg/dL (ref 0.44–1.00)
GFR, Estimated: >60 mL/min (ref >60)
Glucose, Bld: 79 mg/dL (ref 70–99)
Potassium: 4 mmol/L (ref 3.5–5.1)
Sodium: 138 mmol/L (ref 135–145)
Total Bilirubin: 0.3 mg/dL (ref 0.3–1.2)
Total Protein: 7.1 g/dL (ref 6.5–8.1)

## 2021-01-24 NOTE — Progress Notes (Signed)
Neuropathy has improved.

## 2021-01-24 NOTE — Progress Notes (Signed)
one Beth Wells NOTE  Patient Care Team: [place], [name], [name]  CHIEF COMPLAINTS/PURPOSE OF CONSULTATION: Breast cancer  #  Oncology History Overview Note  # OCT 2021-RIGHT BREAST- TRIPLE NEGATIVE [place]; [US/mammo-39m]; Dr.Cintron; OCT 2021-USThere is a 7 mm mass in the right breast at 10 o'clock, favored to represent a complicated cyst;  No evidence of right axillary lymphadenopathy].   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 [Dr.Shah]- skin biopsy- on cymblata+ Lyrica  # DEC 2021-status post evaluation with reproductive endocrinology [9-10% chance of viable pregnancy] # # SURVIVORSHIP:   # GENETICS: BARD  DIAGNOSIS: Breast cancer-triple negative  STAGE:  I       ;  GOALS: cure  CURRENT/MOST RECENT THERAPY : AC-T    Carcinoma of upper-outer quadrant of right breast in [place], estrogen receptor negative [place] Initial Diagnosis   Carcinoma of upper-outer quadrant of right breast in female, estrogen receptor negative ([facility]   Genetic Testing   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 The report date is 08/02/2020.  The Multi-Cancer Panel offered by [facility] includes sequencing and/or deletion duplication testing of the following 85 genes: AIP, ALK, APC, ATM, AXIN2,BAP1,  BARD1, BLM, BMPR1A, BRCA1, BRCA2, BRIP1, CASR, CDC73, CDH1, CDK4, CDKN1B, CDKN1C, CDKN2A (p14ARF), CDKN2A (p16INK4a), CEBPA, CHEK2, CTNNA1, DICER1, DIS3L2, EGFR (c.2369C>T, p.Thr790Met variant only), EPCAM (Deletion/duplication testing only), FH, FLCN, GATA2, GPC3, GREM1 (Promoter region deletion/duplication testing only), HOXB13 (c.251G>A, p.Gly84Glu), HRAS, KIT, MAX, MEN1, MET, MITF (c.952G>A, p.Glu318Lys variant  only), MLH1, MSH2, MSH3, MSH6, MUTYH, NBN, NF1, NF2, NTHL1, PALB2, PDGFRA, PHOX2B, PMS2, POLD1, POLE, POT1, PRKAR1A, PTCH1, PTEN, RAD50, RAD51C, RAD51D, RB1, RECQL4, RET, RNF43, RUNX1, SDHAF2, SDHA (sequence changes only), SDHB, SDHC, SDHD, SMAD4, SMARCA4, SMARCB1, SMARCE1, STK11, SUFU, TERC, TERT, TMEM127, TP53, TSC1, TSC2, VHL, WRN and WT1.    09/10/2020 -  Chemotherapy    Patient is on Treatment Plan: BREAST ADJUVANT DOSE DENSE AC Q14D / PACLITAXEL Q7D         HISTORY OF PRESENTING ILLNESS:  KaMakalya Nave89.o.  female   with stage I triple negative breast cancer currently currently on adjuvant Taxol is here for follow-up.  Patient received cycle #6  Taxol 1 week ago.  Postchemotherapy patient noted to have significant worsening of the neuropathy; patient is currently taking Percocet up to 2-4 times a day.  She is also increase the dose of the Cymbalta/Lyrica.  She is also on Medrol Dosepak.  Patient noted to have improvement of her neuropathy.  Denies any drowsiness or dizzy spells.  Review of Systems  Constitutional: Positive for malaise/fatigue. Negative for chills, diaphoresis, fever and weight loss.  HENT: Negative for nosebleeds and sore throat.   Eyes: Negative for double vision.  Respiratory: Negative for cough, hemoptysis, sputum production, shortness of breath and wheezing.   Cardiovascular: Negative for chest pain, palpitations, orthopnea and leg swelling.  Gastrointestinal: Negative for abdominal pain, blood in stool, diarrhea, heartburn, melena and vomiting.  Genitourinary: Negative for dysuria, frequency and urgency.  Musculoskeletal: Negative for back pain and joint pain.  Skin: Positive for itching and rash.  Neurological: Positive for tingling. Negative for dizziness, focal weakness, weakness and headaches.  Endo/Heme/Allergies: Does not bruise/bleed easily.  Psychiatric/Behavioral: Negative for depression.  The patient is not nervous/anxious and does not have  insomnia.      MEDICAL HISTORY:  Past Medical History:  Diagnosis Date  . Breast cancer ([place]) 08/05/2020  . Carcinoma of upper-outer quadrant of right breast in female, estrogen receptor negative ([place]) 07/13/2020  . Complication of anesthesia    hard to wake up-very dizzy feeling like she was going to pass out  . GERD (gastroesophageal reflux disease)    no meds  . H/O foot surgery 08/2018  . History of kidney stones    h/o  . Hypertension   . PONV (postoperative nausea and vomiting)    n/v aftter kidney stone surgery    SURGICAL HISTORY: Past Surgical History:  Procedure Laterality Date  . BREAST BIOPSY Right 07/06/2020   u/s bx Q clip path pending  . BREAST RECONSTRUCTION WITH PLACEMENT OF TISSUE EXPANDER AND FLEX HD (ACELLULAR HYDRATED DERMIS) Right 08/05/2020   Procedure: RIGHT BREAST RECONSTRUCTION WITH PLACEMENT OF TISSUE EXPANDER AND FLEX HD (ACELLULAR HYDRATED DERMIS);  Surgeon: Wallace Going, DO;  Location: [facility];  Service: Plastics;  Laterality: Right;  . BUNIONECTOMY Right    titanium pin  . CERVICAL CONE BIOPSY    . DILATION AND EVACUATION N/A 03/12/2018   Procedure: DILATATION AND EVACUATION;  Surgeon: [name], MD;  Location: [facility];  Service: Gynecology;  Laterality: N/A;  . KIDNEY STONE SURGERY    . LAPAROSCOPIC OVARIAN CYSTECTOMY Right 06/07/2019   Procedure: LAPAROSCOPIC OVARIAN CYSTECTOMY;  Surgeon: [name], MD;  Location: [facility];  Service: Gynecology;  Laterality: Right;  . LAPAROSCOPY N/A 06/07/2019   Procedure: LAPAROSCOPY Luvenia Redden BIOPSIES;  Surgeon: [name], MD;  Location: [facility];  Service: Gynecology;  Laterality: N/A;  . PORTACATH PLACEMENT Left 08/05/2020   Procedure: INSERTION PORT-A-CATH;  Surgeon: Herbert Pun, MD;  Location: [facility];  Service: General;  Laterality: Left;  . TOTAL MASTECTOMY Right 08/05/2020   Procedure: TOTAL MASTECTOMY w/ Sentinel Node;  Surgeon: Herbert Pun, MD;  Location: [facility];  Service: General;  Laterality: Right;    SOCIAL HISTORY: Social History   Socioeconomic History  . Marital status: Married    Spouse name: [name]  . Number of children: 0  . Years of education: Not on file  . Highest education level: Not on file  Occupational History  . Occupation: Market researcher: [employer]  Tobacco Use  . Smoking status: Never Smoker  . Smokeless tobacco: Never Used  Vaping Use  . Vaping Use: Never used  Substance and Sexual Activity  . Alcohol use: Not Currently  . Drug use: Never  . Sexual activity: Yes    Birth control/protection: Condom  Other Topics Concern  . 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 Lives in [place].    Social Determinants of Health   Financial Resource Strain: Low Risk   . Difficulty of Paying Living Expenses: Not hard at all  Food Insecurity: Not on file  Transportation Needs: No Transportation Needs  . Lack of Transportation (Medical): No  . Lack of Transportation (Non-Medical): No  Physical Activity: Not on file  Stress: Stress Concern Present  . Feeling of Stress : Very much  Social Connections: Not on file  Intimate Partner Violence: Not on file    FAMILY HISTORY: Family History  Problem Relation Age of Onset  . Cancer Other        great  aunt- GI cancer  . Breast cancer Neg Hx     ALLERGIES:  has No Known Allergies.  MEDICATIONS:  Current Outpatient Medications  Medication Sig Dispense Refill  . acetaminophen (TYLENOL) 500 MG tablet Take 1 tablet (500 mg total) by mouth every 6 (six) hours as needed. For use AFTER surgery 30 tablet 0  . ALPHA-LIPOIC ACID PO Take 500 mg by mouth daily.     Marland Kitchen amphetamine-dextroamphetamine (ADDERALL XR) 30 MG 24 hr capsule Take 30 mg by mouth daily.     . DPH-Lido-AlHydr-MgHydr-Simeth (FIRST-MOUTHWASH BLM) SUSP     . DULoxetine  (CYMBALTA) 20 MG capsule Take 2 capsules (40 mg total) by mouth at bedtime. (Patient taking differently: Take 20 mg by mouth at bedtime.) 60 capsule 2  . lidocaine-prilocaine (EMLA) cream Apply 1 application topically as needed. On the port generously; 30-45 mins prior to chemo appt. 30 g 3  . lisinopril (ZESTRIL) 5 MG tablet Take 2.5 mg by mouth every evening.    . magic mouthwash w/lidocaine SOLN Take 5 mLs by mouth. 80 ml viscous lidocaine 2%, 80 ml Mylanta, 80 ml Diphenhydramine 12.5 mg/5 ml Elixir, 80 ml Nystatin 100,000 Unit suspension, 80 ml Prednisolone 15 mg/44m, 80 ml Distilled Water. Sig: Swish/Swallow 5-10 ml four times a day as needed. Dispense 480 ml. 3RFs    . ondansetron (ZOFRAN) 8 MG tablet One pill every 8 hours as needed for nausea/vomitting. 40 tablet 3  . oxyCODONE (OXY IR/ROXICODONE) 5 MG immediate release tablet Take 1 tablet (5 mg total) by mouth every 4 (four) hours as needed for severe pain. 30 tablet 0  . pregabalin (LYRICA) 100 MG capsule Take 1 capsule (100 mg total) by mouth 2 (two) times daily. 180 capsule 1  . Probiotic Product (PROBIOTIC ADVANCED PO) Take 1 capsule by mouth daily.     . prochlorperazine (COMPAZINE) 10 MG tablet Take 1 tablet (10 mg total) by mouth every 6 (six) hours as needed for nausea or vomiting. 40 tablet 3  . sodium fluoride (FLUORISHIELD) 1.1 % GEL dental gel Place 1 application onto teeth 2 (two) times daily.    . SODIUM FLUORIDE 5000 PPM 1.1 % PSTE Take by mouth.    . trolamine salicylate (ASPERCREME) 10 % cream Apply 1 application topically as needed for muscle pain.     No current facility-administered medications for this visit.      .Marland Kitchen PHYSICAL EXAMINATION: ECOG PERFORMANCE STATUS: 0 - Asymptomatic  Vitals:   01/24/21 0850  BP: (!) 135/95  Pulse: 94  Resp: 16  Temp: 98.7 F (37.1 C)  SpO2: 100%   Filed Weights   01/24/21 0850  Weight: 103 lb (46.7 kg)    Physical Exam Constitutional:      Comments: Alone; ambulating  independently.   HENT:     Head: Normocephalic and atraumatic.     Mouth/Throat:     Pharynx: No oropharyngeal exudate.  Eyes:     Pupils: Pupils are equal, round, and reactive to light.  Cardiovascular:     Rate and Rhythm: Normal rate and regular rhythm.  Pulmonary:     Effort: Pulmonary effort is normal. No respiratory distress.     Breath sounds: Normal breath sounds. No wheezing.  Abdominal:     General: Bowel sounds are normal. There is no distension.     Palpations: Abdomen is soft. There is no mass.     Tenderness: There is no abdominal tenderness. There is no guarding or rebound.  Musculoskeletal:  General: No tenderness. Normal range of motion.     Cervical back: Normal range of motion and neck supple.  Skin:    General: Skin is warm.     Comments: Faint maculopapular rash noted on the lower extremities.  Neurological:     Mental Status: She is alert and oriented to person, place, and time.  Psychiatric:        Mood and Affect: Affect normal.      LABORATORY DATA:  I have reviewed the data as listed Lab Results  Component Value Date   WBC 7.5 01/24/2021   HGB 12.2 01/24/2021   HCT 37.6 01/24/2021   MCV 94.5 01/24/2021   PLT 298 01/24/2021   Recent Labs    01/10/21 0940 01/17/21 0850 01/24/21 0830  NA 137 140 138  K 3.6 3.8 4.0  CL 104 105 102  CO2 '26 26 26  ' GLUCOSE 112* 94 79  BUN 19 26* 27*  CREATININE 0.83 0.96 0.79  CALCIUM 9.3 9.2 9.5  GFRNONAA >60 >60 >60  PROT 6.9 6.9 7.1  ALBUMIN 3.8 4.0 4.0  AST '22 26 17  ' ALT '14 21 26  ' ALKPHOS 56 70 98  BILITOT 0.5 0.3 0.3    RADIOGRAPHIC STUDIES: I have personally reviewed the radiological images as listed and agreed with the findings in the report. No results found.  ASSESSMENT & PLAN:   Carcinoma of upper-outer quadrant of right breast in female, estrogen receptor negative ([place]) # Stage I triple negative breast cancer on adjuvant chemotherapy with s/p 4 cycles of AC; Taxol s/p 6  treatments.  Tolerating poorly because of worsening neuropathy [see below].  # DISCONTINUE further Taxol chemotherapy given worsening neuropathy.  Patient s/p 6 cycles of Taxol.  Discussed that patient has more than 90% chance of cure from her cancer.   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  Continue oxycodone 3/day; cymblata 40 mg x3 days;  lyrica 200 midAM; 100 at night.  Taper Lyrica/Cymbalta to regular dose in 1 week if neuropathy improving. Continue tramadol twice daily as per Dr. Zollie Scale.  #Fertility preservation- s/p Zoladex; discussed the possible resumption of menstrual cycles in the next few months.  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  Previously discussed with Dr. Fransisca Connors.  Patient interested in further evaluation; will make a referral.  # DISPOSITION:  # HOLD chemo today; de-access # referral to Gyn-onc re: BARD/? beenfit vs risks of BSO # follow up as planned in 3 month MD; labs- cbc/cmp-Dr.B   All questions were answered. The patient/family knows to call the clinic with any problems, questions or concerns.    Cammie Sickle, MD 01/24/2021 4:30 PM

## 2021-01-24 NOTE — Assessment & Plan Note (Addendum)
#   Stage I triple negative breast cancer on adjuvant chemotherapy with s/p 4 cycles of AC; Taxol s/p 6 treatments.  Tolerating poorly because of worsening neuropathy [see below].  # DISCONTINUE further Taxol chemotherapy given worsening neuropathy.  Patient s/p 6 cycles of Taxol.  Discussed that patient has more than 90% chance of cure from her cancer.   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  Continue oxycodone 3/day; cymblata 40 mg x3 days;  lyrica 200 midAM; 100 at night.  Taper Lyrica/Cymbalta to regular dose in 1 week if neuropathy improving. Continue tramadol twice daily as per Dr. Zollie Scale.  #Fertility preservation- s/p Zoladex; discussed the possible resumption of menstrual cycles in the next few months.  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  Previously discussed with Dr. Fransisca Connors.  Patient interested in further evaluation; will make a referral.  # DISPOSITION:  # HOLD chemo today; de-access # referral to Gyn-onc re: BARD/? beenfit vs risks of BSO # follow up as planned in 3 month MD; labs- cbc/cmp-Dr.B

## 2021-01-24 NOTE — Telephone Encounter (Signed)
Instructions at last visit were to return face to face before 02-04-21. Please call patient to schedule. 

## 2021-01-24 NOTE — Telephone Encounter (Signed)
Instructions at last visit were to return face to face before 02-04-21. Please call patient to schedule.

## 2021-01-25 ENCOUNTER — Ambulatory Visit: Payer: BC Managed Care – PPO | Admitting: Plastic Surgery

## 2021-01-25 ENCOUNTER — Other Ambulatory Visit: Payer: Self-pay

## 2021-01-25 ENCOUNTER — Ambulatory Visit (INDEPENDENT_AMBULATORY_CARE_PROVIDER_SITE_OTHER): Payer: BC Managed Care – PPO | Admitting: Plastic Surgery

## 2021-01-25 ENCOUNTER — Encounter: Payer: Self-pay | Admitting: Plastic Surgery

## 2021-01-25 DIAGNOSIS — C50411 Malignant neoplasm of upper-outer quadrant of right female breast: Secondary | ICD-10-CM

## 2021-01-25 DIAGNOSIS — Z171 Estrogen receptor negative status [ER-]: Secondary | ICD-10-CM

## 2021-01-25 NOTE — Progress Notes (Signed)
   Subjective:    Patient ID: Beth Wells, female    DOB: 01-31-1976, 45 y.o.   MRN: [id number]  The patient is a 45 year old female here for follow-up on her right breast reconstruction.  She had mastectomy on the right that was nipple sparing we have been expanding slowly.  There is no sign of a hematoma or seroma.  The incision is very nicely healed.  She does have a lot of tightness on the lower pole which we need to get relaxed.     Review of Systems  Constitutional: Negative.   Eyes: Negative.   Respiratory: Negative.   Cardiovascular: Negative.   Gastrointestinal: Negative.   Genitourinary: Negative.        Objective:   Physical Exam Vitals and nursing note reviewed.  Constitutional:      Appearance: Normal appearance.  Cardiovascular:     Rate and Rhythm: Normal rate.     Pulses: Normal pulses.  Neurological:     Mental Status: She is alert. Mental status is at baseline.  Psychiatric:        Mood and Affect: Mood normal.        Behavior: Behavior normal.         Assessment & Plan:     ICD-10-CM   1. Carcinoma of upper-outer quadrant of right breast in female, estrogen receptor negative ([place])  C50.411    Z17.1   Will need to do 1 or 2 more fills to get the lower pole of the right breast relaxed.  But we can start picking a date.  We placed injectable saline in the Expander using a sterile technique: Right: 50 cc for a total of 290 / 350 cc

## 2021-01-27 ENCOUNTER — Telehealth: Payer: Self-pay

## 2021-01-27 ENCOUNTER — Inpatient Hospital Stay: Payer: BC Managed Care – PPO

## 2021-01-27 ENCOUNTER — Telehealth: Payer: Self-pay | Admitting: Internal Medicine

## 2021-01-27 NOTE — Telephone Encounter (Signed)
Pt called inquiring about a return back to work note for her to start back on on Monday 5/9. She is asking about restrictions. Wanting to work 30 hrs a week with frequent breaks and rest periods if needed. She is also asking if Dr. B will return a call to her in regards to some questions she has about where she is at with her cancer. Wants to know if she is cured from cancer or in remission.     Dr. Jacinto Reap- can you reach out to patient in regards to her questions and concerns?

## 2021-01-27 NOTE — Telephone Encounter (Signed)
On 5/5- + spoke to patient regarding questions remission; lack of benefit from imaging [unnecessary exposure to radiation]; recommend follow-up as planned.

## 2021-01-28 ENCOUNTER — Inpatient Hospital Stay: Payer: BC Managed Care – PPO

## 2021-01-28 NOTE — Telephone Encounter (Signed)
Note ok to write per Dr. Jacinto Reap. Note written and ready for pick up. Patient is aware.

## 2021-01-31 ENCOUNTER — Encounter: Payer: Self-pay | Admitting: *Deleted

## 2021-02-02 ENCOUNTER — Ambulatory Visit
Payer: BC Managed Care – PPO | Attending: Student in an Organized Health Care Education/Training Program | Admitting: Student in an Organized Health Care Education/Training Program

## 2021-02-02 ENCOUNTER — Other Ambulatory Visit: Payer: Self-pay | Admitting: Student in an Organized Health Care Education/Training Program

## 2021-02-02 ENCOUNTER — Other Ambulatory Visit: Payer: Self-pay

## 2021-02-02 ENCOUNTER — Encounter: Payer: Self-pay | Admitting: Student in an Organized Health Care Education/Training Program

## 2021-02-02 VITALS — BP 141/97 | HR 96 | Temp 97.2°F | Resp 16 | Ht 61.0 in | Wt 102.0 lb

## 2021-02-02 DIAGNOSIS — R202 Paresthesia of skin: Secondary | ICD-10-CM | POA: Insufficient documentation

## 2021-02-02 DIAGNOSIS — G894 Chronic pain syndrome: Secondary | ICD-10-CM

## 2021-02-02 DIAGNOSIS — G629 Polyneuropathy, unspecified: Secondary | ICD-10-CM | POA: Insufficient documentation

## 2021-02-02 DIAGNOSIS — G603 Idiopathic progressive neuropathy: Secondary | ICD-10-CM

## 2021-02-02 DIAGNOSIS — G608 Other hereditary and idiopathic neuropathies: Secondary | ICD-10-CM | POA: Insufficient documentation

## 2021-02-02 DIAGNOSIS — C50411 Malignant neoplasm of upper-outer quadrant of right female breast: Secondary | ICD-10-CM | POA: Diagnosis not present

## 2021-02-02 DIAGNOSIS — Z171 Estrogen receptor negative status [ER-]: Secondary | ICD-10-CM | POA: Diagnosis present

## 2021-02-02 MED ORDER — TRAMADOL HCL 50 MG PO TABS: 50.0000 mg | ORAL_TABLET | Freq: Two times a day (BID) | ORAL | 1 refills | Status: DC | PRN

## 2021-02-02 MED ORDER — PREGABALIN 100 MG PO CAPS: 100.0000 mg | ORAL_CAPSULE | Freq: Two times a day (BID) | ORAL | 1 refills | Status: DC

## 2021-02-02 MED ORDER — TRAMADOL HCL ER 100 MG PO TB24: 100.0000 mg | ORAL_TABLET | Freq: Every day | ORAL | 1 refills | Status: DC

## 2021-02-02 NOTE — Progress Notes (Signed)
Safety precautions to be maintained throughout the outpatient stay will include: orient to surroundings, keep bed in low position, maintain call bell within reach at all times, provide assistance with transfer out of bed and ambulation.  

## 2021-02-02 NOTE — Progress Notes (Signed)
PROVIDER NOTE: Information contained herein reflects review and annotations entered in association with encounter. Interpretation of such information and data should be left to medically-trained personnel. Information provided to patient can be located elsewhere in the medical record under "Patient Instructions". Document created using STT-dictation technology, any transcriptional errors that may result from process are unintentional.    Patient: Beth Wells  Service Category: E/M  Provider: Gillis Santa, MD  DOB: 10-Dec-1975  DOS: 02/02/2021  Specialty: Interventional Pain Management  MRN: [id number]  Setting: Ambulatory outpatient  PCP: Beth Rafter, MD  Type: Established Patient    Referring Provider: Langley Wells Primary Ca*  Location: Office  Delivery: Face-to-face     HPI  Beth Wells, a 45 y.o. year old female, is here today because of her Chronic pain syndrome [G89.4]. Beth Wells primary complain today is Leg Pain and Neck Pain Last encounter: My last encounter with her was on 01/24/2021. Pertinent problems: Beth Wells has Small fiber neuropathy; Paresthesias; and Chronic pain syndrome on their pertinent problem list. Pain Assessment: Severity of Chronic pain is reported as a 6 /10. Location: Leg Anterior/General pain all over but at the momement leg pain is worse. From upper leg into calfs and ankles.. Onset: More than a month ago. Quality: Pins and needles,Burning,Constant. Timing: Constant. Modifying factor(s): Heat helps ease off the pain. Being active helps with pain. Vitals:  height is '5\' 1"'  (1.549 m) and weight is 102 lb (46.3 kg). Her temporal temperature is 97.2 F (36.2 C) (abnormal). Her blood pressure is 141/97 (abnormal) and her pulse is 96. Her respiration is 16 and oxygen saturation is 100%.   Reason for encounter: medication management.    Is having increased pain at times. Stopped Cisplatin treatment. Discussed addition of Tramadol 100 mg ER, continue 50 mg BID  prn breakthrough pain Also discussed a lidocaine infusion for neuropathic pain.  QTC is 456 ms.  We will plan on doing that in the next couple of weeks.  Pharmacotherapy Assessment   Analgesic: Start tramadol 100 mg ER, continue tramadol IR 50 mg twice daily for breakthrough pain    Monitoring: [place] PMP: PDMP reviewed during this encounter.       Pharmacotherapy: No side-effects or adverse reactions reported. Compliance: No problems identified. Effectiveness: Clinically acceptable.  Beth Napoleon, RN  02/02/2021  2:46 PM  Sign when Signing Visit Safety precautions to be maintained throughout the outpatient stay will include: orient to surroundings, keep bed in low position, maintain call bell within reach at all times, provide assistance with transfer out of bed and ambulation.     UDS: No results found for: SUMMARY   ROS  Constitutional: Denies any fever or chills Gastrointestinal: No reported hemesis, hematochezia, vomiting, or acute GI distress Musculoskeletal: Denies any acute onset joint swelling, redness, loss of ROM, or weakness Neurological: No reported episodes of acute onset apraxia, aphasia, dysarthria, agnosia, amnesia, paralysis, loss of coordination, or loss of consciousness  Medication Review  Alpha-Lipoic Acid, DULoxetine, Probiotic Product, amphetamine-dextroamphetamine, lisinopril, pregabalin, sodium fluoride, traMADol, and trolamine salicylate  History Review  Allergy: Beth Wells has No Known Allergies. Drug: Beth Wells  reports no history of drug use. Alcohol:  reports previous alcohol use. Tobacco:  reports that she has never smoked. She has never used smokeless tobacco. Social: Beth Wells  reports that she has never smoked. She has never used smokeless tobacco. She reports previous alcohol use. She reports that she does not use drugs. Medical:  has a past medical history  of Breast cancer ([place]) (08/05/2020), Carcinoma of upper-outer quadrant of right breast in  female, estrogen receptor negative ([place]) (47/65/4650), Complication of anesthesia, GERD (gastroesophageal reflux disease), H/O foot surgery (08/2018), History of kidney stones, Hypertension, and PONV (postoperative nausea and vomiting). Surgical: Beth Wells  has a past surgical history that includes Kidney stone surgery; Cervical cone biopsy; Dilation and evacuation (N/A, 03/12/2018); laparoscopy (N/A, 06/07/2019); Laparoscopic ovarian cystectomy (Right, 06/07/2019); Breast biopsy (Right, 07/06/2020); Bunionectomy (Right); Total mastectomy (Right, 08/05/2020); Portacath placement (Left, 08/05/2020); and Breast reconstruction with placement of tissue expander and flex hd (acellular hydrated dermis) (Right, 08/05/2020). Family: family history includes Cancer in an other family member.  Laboratory Chemistry Profile   Renal Lab Results  Component Value Date   BUN 27 (H) 01/24/2021   CREATININE 0.79 01/24/2021   GFRAA >60 06/07/2019   GFRNONAA >60 01/24/2021     Hepatic Lab Results  Component Value Date   AST 17 01/24/2021   ALT 26 01/24/2021   ALBUMIN 4.0 01/24/2021   ALKPHOS 98 01/24/2021     Electrolytes Lab Results  Component Value Date   NA 138 01/24/2021   K 4.0 01/24/2021   CL 102 01/24/2021   CALCIUM 9.5 01/24/2021     Bone No results found for: VD25OH, [id number], TZ0017CB4, WH6759FM3, 25OHVITD1, 25OHVITD2, 25OHVITD3, TESTOFREE, TESTOSTERONE   Inflammation (CRP: Acute Phase) (ESR: Chronic Phase) Lab Results  Component Value Date   LATICACIDVEN 1.4 10/18/2020       Note: Above Lab results reviewed.  Recent Imaging Review  DG Chest 2 View CLINICAL DATA:  Breast cancer patient with fever today. Chills and weakness. Recent mastectomy.  EXAM: CHEST - 2 VIEW  COMPARISON:  08/05/2020  FINDINGS: Hyperinflation of the lungs. Power port type left central venous catheter with tip over the low SVC region. No pneumothorax. Heart size and pulmonary vascularity are normal.  Postoperative right mastectomy with breast reconstruction. Lungs are clear. No airspace disease, consolidation, or edema. No pleural effusions. No pneumothorax. Mediastinal contours appear intact.  IMPRESSION: No active cardiopulmonary disease.  Electronically Signed   By: Lucienne Capers M.D.   On: 10/18/2020 23:25 Note: Reviewed        Physical Exam  General appearance: Well nourished, well developed, and well hydrated. In no apparent acute distress Mental status: Alert, oriented x 3 (person, place, & time)       Respiratory: No evidence of acute respiratory distress Eyes: PERLA Vitals: BP (!) 141/97   Pulse 96   Temp (!) 97.2 F (36.2 C) (Temporal)   Resp 16   Ht '5\' 1"'  (1.549 m)   Wt 102 lb (46.3 kg)   SpO2 100%   BMI 19.27 kg/m  BMI: Estimated body mass index is 19.27 kg/m as calculated from the following:   Height as of this encounter: '5\' 1"'  (1.549 m).   Weight as of this encounter: 102 lb (46.3 kg). Ideal: Ideal body weight: 47.8 kg (105 lb 6.1 oz)  Neurogenic pain pattern of lower extremity 5 out of 5 strength bilateral lower extremity: Plantar flexion, dorsiflexion, knee flexion, knee extension.   Assessment   Diagnosis  1. Chronic pain syndrome   2. Small fiber neuropathy   3. Idiopathic progressive neuropathy   4. Carcinoma of upper-outer quadrant of right breast in female, estrogen receptor negative ([place])   5. Idiopathic small fiber sensory neuropathy   6. Paresthesias      Updated Problems: Problem  Small Fiber Neuropathy  Paresthesias  Chronic Pain Syndrome  Plan of Care  Ms. Aysiah Jurado has a current medication list which includes the following long-term medication(s): amphetamine-dextroamphetamine, duloxetine, lisinopril, and pregabalin.  Pharmacotherapy (Medications Ordered): Meds ordered this encounter  Medications  . traMADol (ULTRAM-ER) 100 MG 24 hr tablet    Sig: Take 1 tablet (100 mg total) by mouth daily. For chronic pain  syndrome    Dispense:  30 tablet    Refill:  1  . DISCONTD: traMADol (ULTRAM) 50 MG tablet    Sig: Take 1 tablet (50 mg total) by mouth every 12 (twelve) hours as needed for severe pain.    Dispense:  60 tablet    Refill:  1    Fill one day early if pharmacy is closed on scheduled refill date. Do not fill until:  To last until:  . pregabalin (LYRICA) 100 MG capsule    Sig: Take 1 capsule (100 mg total) by mouth 2 (two) times daily.    Dispense:  180 capsule    Refill:  1    Fill one day early if pharmacy is closed on scheduled refill date. May substitute for generic if available.  . traMADol (ULTRAM) 50 MG tablet    Sig: Take 1 tablet (50 mg total) by mouth every 12 (twelve) hours as needed for severe pain.    Dispense:  60 tablet    Refill:  1    To last for 30 days from fill date   Orders:  Orders Placed This Encounter  Procedures  . LIDOCAINE INFUSION    Standing Status:   Future    Standing Expiration Date:   03/04/2021    Scheduling Instructions:     Laterality: Does not apply     Level(s): Peripheral vascular access     Sedation: With IV Sedation     Scheduling Timeframe: As soon as pre-approved     NOTE: Continuous cardiovascular monitoring required (ECG, NIBPM, SpO2)    Order Specific Question:   Where will this procedure be performed?    Answer:   [facility]   Follow-up plan:   Return in about 2 weeks (around 02/16/2021) for Lidocaine infusion.   Recent Visits Date Type Provider Dept  02/02/21 Office Visit Beth Santa, MD [facility]  01/06/21 Telemedicine Beth Santa, MD [facility]  12/13/20 Telemedicine Beth Santa, MD [facility]  Showing recent visits within past 90 days and meeting all other requirements Future Appointments Date Type Provider Dept  02/16/21 Appointment Beth Santa, MD [facility]  03/22/21 Appointment Beth Santa, MD [facility]  Showing future appointments within next 90  days and meeting all other requirements  I discussed the assessment and treatment plan with the patient. The patient was provided an opportunity to ask questions and all were answered. The patient agreed with the plan and demonstrated an understanding of the instructions.  Patient advised to call back or seek an in-person evaluation if the symptoms or condition worsens.  Duration of encounter: 30 minutes.  Note by: Beth Santa, MD Date: 02/02/2021; Time: 10:06 AM

## 2021-02-03 ENCOUNTER — Encounter: Payer: Self-pay | Admitting: Student in an Organized Health Care Education/Training Program

## 2021-02-04 ENCOUNTER — Telehealth: Payer: Self-pay | Admitting: Student in an Organized Health Care Education/Training Program

## 2021-02-04 NOTE — Telephone Encounter (Signed)
Patient called stating her pharmacy does not have the 2nd script for Tramadol that Dr. Holley Raring was supposed to send. She knows her insurnace will not cover the extended release but there was supposed to be sent a regular script. Please check on this and let patient know status.

## 2021-02-04 NOTE — Telephone Encounter (Signed)
Called Pharm. And they stated the prescription for that medication has been ready since 02/02/21. I did note that the prescription was received on the 11th also. Called patient to inform.

## 2021-02-04 NOTE — Telephone Encounter (Signed)
New PA was done on Tramadol ER and it was approved. Called and notified patient

## 2021-02-08 ENCOUNTER — Telehealth: Payer: Self-pay | Admitting: Plastic Surgery

## 2021-02-08 ENCOUNTER — Other Ambulatory Visit: Payer: Self-pay

## 2021-02-08 ENCOUNTER — Encounter: Payer: Self-pay | Admitting: Plastic Surgery

## 2021-02-08 ENCOUNTER — Ambulatory Visit (INDEPENDENT_AMBULATORY_CARE_PROVIDER_SITE_OTHER): Payer: BC Managed Care – PPO | Admitting: Plastic Surgery

## 2021-02-08 DIAGNOSIS — Z171 Estrogen receptor negative status [ER-]: Secondary | ICD-10-CM

## 2021-02-08 DIAGNOSIS — C50411 Malignant neoplasm of upper-outer quadrant of right female breast: Secondary | ICD-10-CM

## 2021-02-08 DIAGNOSIS — Z9011 Acquired absence of right breast and nipple: Secondary | ICD-10-CM

## 2021-02-08 MED ORDER — DIAZEPAM 2 MG PO TABS: 2.0000 mg | ORAL_TABLET | Freq: Two times a day (BID) | ORAL | 0 refills | Status: DC | PRN

## 2021-02-08 NOTE — Progress Notes (Signed)
   Subjective:    Patient ID: Beth Wells, female    DOB: 05-16-1976, 45 y.o.   MRN: [id number]  The patient is a 45 year old female here for follow-up on her breast reconstruction.  Is very tight on that right side.  She needs just a little bit more volume to fill out the lower portion.  We were able to add 30 cc of fluid to the right expander.  She is otherwise doing really well and has good stretch on the upper pole.   Review of Systems  Constitutional: Negative.   Eyes: Negative.   Respiratory: Negative.   Cardiovascular: Negative.   Genitourinary: Negative.        Objective:   Physical Exam Vitals and nursing note reviewed.  Constitutional:      Appearance: Normal appearance.  HENT:     Head: Normocephalic and atraumatic.  Cardiovascular:     Rate and Rhythm: Normal rate.     Pulses: Normal pulses.  Neurological:     General: No focal deficit present.     Mental Status: She is alert.  Psychiatric:        Mood and Affect: Mood normal.        Behavior: Behavior normal.           Assessment & Plan:     ICD-10-CM   1. Acquired absence of right breast  Z90.11   2. Carcinoma of upper-outer quadrant of right breast in female, estrogen receptor negative ([place])  C50.411    Z17.1     We will go ahead and get her orders in for her implants.  She decided on silicone. We placed injectable saline in the Expander using a sterile technique: Right: 30 cc for a total of 330 / 350 cc

## 2021-02-08 NOTE — Telephone Encounter (Signed)
Patient called to follow up on valium rx that was supposed to be called in to [facility] in [place]. Please call patient to advise when called in to the pharmacy

## 2021-02-08 NOTE — Telephone Encounter (Signed)
Patient's medication has been called in from her appointment today

## 2021-02-09 ENCOUNTER — Inpatient Hospital Stay (HOSPITAL_BASED_OUTPATIENT_CLINIC_OR_DEPARTMENT_OTHER): Payer: BC Managed Care – PPO | Admitting: Obstetrics and Gynecology

## 2021-02-09 VITALS — BP 126/92 | HR 90 | Temp 97.8°F | Resp 20 | Wt 101.3 lb

## 2021-02-09 DIAGNOSIS — C50411 Malignant neoplasm of upper-outer quadrant of right female breast: Secondary | ICD-10-CM | POA: Diagnosis not present

## 2021-02-09 DIAGNOSIS — Z1501 Genetic susceptibility to malignant neoplasm of breast: Secondary | ICD-10-CM

## 2021-02-09 DIAGNOSIS — Z1502 Genetic susceptibility to malignant neoplasm of ovary: Secondary | ICD-10-CM | POA: Diagnosis not present

## 2021-02-09 DIAGNOSIS — Z148 Genetic carrier of other disease: Secondary | ICD-10-CM

## 2021-02-09 NOTE — Progress Notes (Signed)
Gynecologic Oncology Consult Visit   Referring Provider: Dr. 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 G1 P0 female who is seen in consultation from Dr. Rogue Bussing for [place] mutation.  Diagnosed with breast cancer 11/21.  Treatment with Dr Rogue Bussing. Total mastectomy and SLN.  Stage I triple negative breast cancer on adjuvant chemotherapy with s/p 4 cycles of AC; Taxol s/p 6 treatments.  Tolerated poorly because of worsening neuropathy.  DISCONTINUE further Taxol chemotherapy 4/22 given worsening neuropathy.  Patient s/p 6 cycles of Taxol.  Discussed that patient has more than 90% chance of cure from her cancer.  Regular menses prior to chemo.  None since.  Has some hot flashes. Took GnrH agonist with chemo to suppress ovaries, but none now. Saw REI and told she had low follicles and had very low chance of pregnancy even with IVF without donor eggs. Not sure yet how she and her husband will proceed.   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  No FH breast or ovarian cancer.   D&E 6/19 for SAB History of ovarian torsion with cystectomy 9/20 Dr Leafy Ro. History of cone biopsy 2015 with negative PAPs since, last 2 years ago.  Problem List: Patient Active Problem List   Diagnosis Date Noted  . Neutropenic fever ([place]) 10/19/2020  . Leukopenia due to antineoplastic chemotherapy ([place]) 10/19/2020  . Acquired absence of breast 08/31/2020  . Breast cancer ([place]) 08/05/2020  . BARD1 gene mutation positive 08/03/2020  . Carcinoma of upper-outer quadrant of right breast in female, estrogen receptor negative ([place]) 07/13/2020  . 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 Small fiber neuropathy 07/24/2019  . Paresthesias 07/24/2019  . Chronic pain syndrome 07/24/2019  . 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  Bilateral foot pain 12/11/2018  . Chromosomal abnormality     Past Medical History: Past Medical History:  Diagnosis Date  . Breast cancer ([place]) 08/05/2020  . Carcinoma of upper-outer quadrant of right breast in female, estrogen receptor negative ([place]) 07/13/2020  . Complication of anesthesia    hard to wake up-very dizzy feeling like she was going to pass out  . GERD (gastroesophageal reflux disease)    no meds  . H/O foot surgery 08/2018  . History of kidney stones    h/o  . Hypertension   . PONV (postoperative nausea and vomiting)    n/v aftter kidney stone surgery    Past Surgical History: Past Surgical History:  Procedure Laterality Date  . BREAST BIOPSY Right 07/06/2020   u/s bx Q clip path pending  . BREAST RECONSTRUCTION WITH PLACEMENT OF TISSUE EXPANDER AND FLEX HD (ACELLULAR HYDRATED DERMIS) Right 08/05/2020   Procedure: RIGHT BREAST RECONSTRUCTION WITH PLACEMENT OF TISSUE EXPANDER AND FLEX HD (ACELLULAR HYDRATED DERMIS);  Surgeon: Wallace Going, DO;  Location: [facility];  Service: Plastics;  Laterality: Right;  . BUNIONECTOMY Right    titanium pin  . CERVICAL CONE BIOPSY    . DILATION AND EVACUATION N/A 03/12/2018   Procedure: DILATATION AND EVACUATION;  Surgeon: [name], MD;  Location: [facility];  Service: Gynecology;  Laterality: N/A;  . KIDNEY STONE SURGERY    . LAPAROSCOPIC OVARIAN CYSTECTOMY Right 06/07/2019   Procedure: LAPAROSCOPIC OVARIAN CYSTECTOMY;  Surgeon: [name], MD;  Location: [facility];  Service: Gynecology;  Laterality: Right;  . LAPAROSCOPY N/A 06/07/2019  Procedure: LAPAROSCOPY DIAGNOSTIC,PERITANEAL BIOPSIES;  Surgeon: [name], MD;  Location: [facility];  Service: Gynecology;  Laterality: N/A;  . PORTACATH PLACEMENT Left 08/05/2020   Procedure: INSERTION PORT-A-CATH;  Surgeon: Herbert Pun, MD;  Location: [facility];  Service: General;  Laterality: Left;  . TOTAL MASTECTOMY Right 08/05/2020   Procedure: TOTAL  MASTECTOMY w/ Sentinel Node;  Surgeon: Herbert Pun, MD;  Location: [facility];  Service: General;  Laterality: Right;       OB History:  OB History  Gravida Para Term Preterm AB Living  1         0  SAB IAB Ectopic Multiple Live Births               # Outcome Date GA Lbr Len/2nd Weight Sex Delivery Anes PTL Lv  1 Gravida             Family History: Family History  Problem Relation Age of Onset  . Cancer Other        great aunt- GI cancer  . Breast cancer Neg Hx     Social History: Social History   Socioeconomic History  . Marital status: Married    Spouse name: [name]  . Number of children: 0  . Years of education: Not on file  . Highest education level: Not on file  Occupational History  . Occupation: Market researcher: [employer]  Tobacco Use  . Smoking status: Never Smoker  . Smokeless tobacco: Never Used  Vaping Use  . Vaping Use: Never used  Substance and Sexual Activity  . Alcohol use: Not Currently  . Drug use: Never  . Sexual activity: Yes    Birth control/protection: Condom  Other Topics Concern  . 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 Lives in [place].    Social Determinants of Health   Financial Resource Strain: Low Risk   . Difficulty of Paying Living Expenses: Not hard at all  Food Insecurity: Not on file  Transportation Needs: No Transportation Needs  . Lack of Transportation (Medical): No  . Lack of Transportation (Non-Medical): No  Physical Activity: Not on file  Stress: Stress Concern Present  . Feeling of Stress : Very much  Social Connections: Not on file  Intimate Partner Violence: Not on file    Allergies: No Known Allergies  Current Medications: Current Outpatient Medications  Medication Sig Dispense Refill  . ALPHA-LIPOIC ACID PO Take 500 mg by mouth daily.     Marland Kitchen amphetamine-dextroamphetamine (ADDERALL  XR) 30 MG 24 hr capsule Take 30 mg by mouth daily.     . diazepam (VALIUM) 2 MG tablet Take 1 tablet (2 mg total) by mouth every 12 (twelve) hours as needed for up to 10 doses for muscle spasms. 10 tablet 0  . DULoxetine (CYMBALTA) 20 MG capsule Take 2 capsules (40 mg total) by mouth at bedtime. (Patient taking differently: Take 20 mg by mouth at bedtime.) 60 capsule 2  . lisinopril (ZESTRIL) 5 MG tablet Take 2.5 mg by mouth every evening.    . pregabalin (LYRICA) 100 MG capsule Take 1 capsule (100 mg total) by mouth 2 (two) times daily. 180 capsule 1  . Probiotic Product (PROBIOTIC ADVANCED PO) Take 1 capsule by mouth daily.     . sodium fluoride (FLUORISHIELD) 1.1 % GEL dental gel Place 1 application onto teeth 2 (two) times daily.    . traMADol (  ULTRAM) 50 MG tablet Take 1 tablet (50 mg total) by mouth every 12 (twelve) hours as needed for severe pain. 60 tablet 1  . traMADol (ULTRAM-ER) 100 MG 24 hr tablet Take 1 tablet (100 mg total) by mouth daily. For chronic pain syndrome 30 tablet 1  . trolamine salicylate (ASPERCREME) 10 % cream Apply 1 application topically as needed for muscle pain.     No current facility-administered medications for this visit.    Review of Systems General: negative for, fevers, chills, fatigue, changes in sleep, changes in weight or appetite Skin: negative for changes in color, texture, moles or lesions Eyes: negative for, changes in vision, pain, diplopia HEENT: negative for, change in hearing, pain, discharge, tinnitus, vertigo, voice changes, sore throat, neck masses Pulmonary: negative for, dyspnea, orthopnea, productive cough Cardiac: negative for, palpitations, syncope, pain, discomfort, pressure Gastrointestinal: negative for, dysphagia, nausea, vomiting, jaundice, pain, constipation, diarrhea, hematemesis, hematochezia Genitourinary/Sexual: negative for, dysuria, discharge, hesitancy, nocturia, retention, stones, infections, STD's,  incontinence Musculoskeletal: negative for, pain, stiffness, swelling, range of motion limitation Hematology: negative for, easy bruising, bleeding Neurologic/Psych: negative for, headaches, seizures, paralysis, weakness, tremor, change in gait, change in sensation, mood swings, depression, anxiety, change in memory  Objective:  Physical Examination:  BP (!) 126/92   Pulse 90   Temp 97.8 F (36.6 C)   Resp 20   Wt 101 lb 4.8 oz (45.9 kg)   SpO2 100%   BMI 19.14 kg/m    ECOG Performance Status: 0 - Asymptomatic  General appearance: alert, cooperative and appears stated age HEENT:PERRLA Lymph node survey: non-palpable, axillary, inguinal, supraclavicular Cardiovascular: regular rate and rhythm, no murmurs or gallops Respiratory: normal air entry, lungs clear to auscultation. Abdomen: soft, non-tender, without masses or organomegaly, no hernias and well healed incision Back: inspection of back is normal Extremities: extremities normal, atraumatic, no cyanosis or edema Skin exam - normal coloration and turgor, no rashes, no suspicious skin lesions noted. Neurological exam reveals alert, oriented, normal speech, no focal findings or movement disorder noted.  Pelvic: exam chaperoned by nurse, EGBUS within normal limits;  Vulva: normal appearing vulva with no masses, tenderness or lesions; Vagina: normal vagina; Adnexa: normal adnexa in size, nontender and no masses; Uterus: uterus is normal size, shape, consistency and nontender; Cervix: anteverted; Rectal: not indicated  Assessment:  Vassie Kugel is a 45 y.o. G1P0 female with pathogenic BARD1 mutation diagnosed with stage I triple negative breast cancer last year and recently completed chemotherapy. Prognosis excellent.   No menses since chemotherapy, but normal prior to that.  She desires fertility and has seen REI and been told her follicles are low and that the change of conception even with IVF is very low.  Donor eggs were  recommended and they are considering this advice.   Referred to discuss ovarian/tubal cancer risk with BARD1 mutation.    Medical co-morbidities complicating care: cone biopsy 2015, ovarian cystectomy 9/20 for torsion.  Plan:   Problem List Items Addressed This Visit      Other   BARD1 gene mutation positive - Primary      We discussed that her pathogenic BARD1 mutation increases risk of breast cancer to about 25%, but there is no strong data suggesting a significant increased ovarian cancer risk.  That said, studies to date cannot rule out a small increase in lifetime risk in the 3-5% range.  Presently, NCCN guidelines do not recommend risk reducing BSO for BARD1 mutation carriers.     At present she will  continue to evaluate her fertility options and follow up with her regular Gyn Dr. Leafy Ro.  Discussed that there was no recommended screening test for ovarian cancer.    She may want to reconsider risk reducing surgery in a few years after fertility is no longer an issue.  Perhaps there will be more data that better quantifies the ovarian/tubal cancer risk at that point to help inform decision making.     The patient's diagnosis, an outline of the further diagnostic and laboratory studies which will be required, the recommendation, and alternatives were discussed.  All questions were answered to the patient's satisfaction.  A total of 40 minutes were spent with the patient/family today; 50% was spent in education, counseling and coordination of care for BARD1 mutation.    Mellody Drown, MD   CC:  Shari Prows, [facility] [place],  [place] [address] [phone]

## 2021-02-16 ENCOUNTER — Encounter: Payer: Self-pay | Admitting: Student in an Organized Health Care Education/Training Program

## 2021-02-16 ENCOUNTER — Ambulatory Visit
Payer: BC Managed Care – PPO | Attending: Student in an Organized Health Care Education/Training Program | Admitting: Student in an Organized Health Care Education/Training Program

## 2021-02-16 ENCOUNTER — Other Ambulatory Visit: Payer: Self-pay

## 2021-02-16 VITALS — BP 147/88 | HR 84 | Temp 97.0°F | Resp 15 | Ht 62.0 in | Wt 102.4 lb

## 2021-02-16 DIAGNOSIS — G629 Polyneuropathy, unspecified: Secondary | ICD-10-CM | POA: Insufficient documentation

## 2021-02-16 DIAGNOSIS — C50411 Malignant neoplasm of upper-outer quadrant of right female breast: Secondary | ICD-10-CM | POA: Insufficient documentation

## 2021-02-16 DIAGNOSIS — R202 Paresthesia of skin: Secondary | ICD-10-CM | POA: Diagnosis present

## 2021-02-16 DIAGNOSIS — G603 Idiopathic progressive neuropathy: Secondary | ICD-10-CM | POA: Insufficient documentation

## 2021-02-16 DIAGNOSIS — G608 Other hereditary and idiopathic neuropathies: Secondary | ICD-10-CM | POA: Diagnosis present

## 2021-02-16 DIAGNOSIS — G894 Chronic pain syndrome: Secondary | ICD-10-CM

## 2021-02-16 DIAGNOSIS — Z171 Estrogen receptor negative status [ER-]: Secondary | ICD-10-CM | POA: Insufficient documentation

## 2021-02-16 MED ORDER — LIDOCAINE IN D5W 4-5 MG/ML-% IV SOLN
3.0000 mg/min | INTRAVENOUS | Status: AC
  Administered 2021-02-16: 3 mg/min via INTRAVENOUS

## 2021-02-16 MED ORDER — LIDOCAINE IN D5W 4-5 MG/ML-% IV SOLN
INTRAVENOUS | Status: AC
  Filled 2021-02-16: qty 500

## 2021-02-16 NOTE — Progress Notes (Signed)
Safety precautions to be maintained throughout the outpatient stay will include: orient to surroundings, keep bed in low position, maintain call bell within reach at all times, provide assistance with transfer out of bed and ambulation.  

## 2021-02-16 NOTE — Progress Notes (Signed)
1235 Patient prepped for Lidocaine infusion. Intralipids 20%  At bedside for emergency. Patient informed about s/s such as ringing in the ears, metallic taste, or anything that does not feel right to her.Asked her  to notify us of during the infusion. Patient with understanding. 1246 Lidocaine infusion started a 45 ml/h 1356 Lidocaine infusion complete no s/s of ringing in ears, or metallic taste noted. Patient doing well.

## 2021-02-16 NOTE — Progress Notes (Signed)
PROVIDER NOTE: Information contained herein reflects review and annotations entered in association with encounter. Interpretation of such information and data should be left to medically-trained personnel. Information provided to patient can be located elsewhere in the medical record under "Patient Instructions". Document created using STT-dictation technology, any transcriptional errors that may result from process are unintentional.    Patient: Beth Wells  Service Category: Procedure  Provider: Gillis Santa, MD  DOB: Nov 14, 1975  DOS: 02/16/2021  Location: [facility]  MRN: [id number]  Setting: Ambulatory - outpatient  Referring Provider: Elza Rafter, *  Type: Established Patient  Specialty: Interventional Pain Management  PCP: Elza Rafter, MD   Primary Reason for Visit: Interventional Pain Management Treatment. CC: neuropathic pain  Procedure:           Type: Palliative Intravenous lidocaine infusion Region: Systemic Level: Upper Extremity IV access Laterality: Please see nurses note.    Indications: 1. Chronic pain syndrome   2. Small fiber neuropathy   3. Idiopathic progressive neuropathy   4. Carcinoma of upper-outer quadrant of right breast in female, estrogen receptor negative ([place])   5. Idiopathic small fiber sensory neuropathy   6. Paresthesias    Pain Score: Pre-procedure: 4 /10 Post-procedure: 3  (States no relief at this time)/10   Pre-op H&P Assessment:  Beth Wells is a 45 y.o. (year old), female patient, seen today for interventional treatment. She  has a past surgical history that includes Kidney stone surgery; Cervical cone biopsy; Dilation and evacuation (N/A, 03/12/2018); laparoscopy (N/A, 06/07/2019); Laparoscopic ovarian cystectomy (Right, 06/07/2019); Breast biopsy (Right, 07/06/2020); Bunionectomy (Right); Total mastectomy (Right, 08/05/2020); Portacath placement (Left, 08/05/2020); and Breast reconstruction with placement of tissue  expander and flex hd (acellular hydrated dermis) (Right, 08/05/2020). Beth Wells has a current medication list which includes the following prescription(s): alpha-lipoic acid, amphetamine-dextroamphetamine, diazepam, duloxetine, lisinopril, pregabalin, probiotic product, tramadol, tramadol, trolamine salicylate, and sodium fluoride. Her primarily concern today is the Generalized Body Aches  Initial Vital Signs:  Pulse/HCG Rate: 79ECG Heart Rate: 81 Temp: (!) 97 F (36.1 C) Resp: 16 BP: 129/86 SpO2: 100 %  BMI: Estimated body mass index is 18.73 kg/m as calculated from the following:   Height as of this encounter: 5\' 2"  (1.575 m).   Weight as of this encounter: 102 lb 6.4 oz (46.4 kg).  Risk Assessment: Allergies: Reviewed. She has No Known Allergies.  Allergy Precautions: None required Coagulopathies: Reviewed. None identified.  Blood-thinner therapy: None at this time Active Infection(s): Reviewed. None identified. Beth Wells is afebrile  Site Confirmation: Beth Wells was asked to confirm the procedure and laterality before marking the site Procedure checklist: Completed Consent: Before the procedure and under the influence of no sedative(s), amnesic(s), or anxiolytics, the patient was informed of the treatment options, risks and possible complications. To fulfill our ethical and legal obligations, as recommended by the American Medical Association's Code of Ethics, I have informed the patient of my clinical impression; the nature and purpose of the treatment or procedure; the risks, benefits, and possible complications of the intervention; the alternatives, including doing nothing; the risk(s) and benefit(s) of the alternative treatment(s) or procedure(s); and the risk(s) and benefit(s) of doing nothing. The patient was provided information about the general risks and possible complications associated with any invasive procedure. These may include, but are not limited to: failure to achieve  desired goals; pain; worsening of initial condition; infections; bleeding; organ or nerve damage; allergic reactions; and death. In addition, the patient was informed of those risks  and complications associated to this procedure, such as failure to decrease pain; infection; bleeding; phlebitis; vascular extravasation; tissue necrosis; tenderness at IV access site; skin or nerve damage with subsequent damage to sensory, motor, and/or autonomic systems; worsening of the pain; persistent pain, numbness, and/or weakness of one or several areas of the body; cardiac dysrhythmias; seizure; stroke; and/or death. Furthermore, the patient was informed of those risks and complications associated with the medications used during the procedure. These include, but are not limited to: allergic reactions (i.e.: anaphylactic or anaphylactoid reactions); cardiac conduction blockade; poisoning; toxicity; CNS depression; cardiovascular depression and collapse; muscle twitching; tonic-clonic seizures; convulsions; loss of consciousness; coma; respiratory depression; arrest; and/or death. Finally, the patient was informed that Medicine is not an exact science; therefore, there is also the possibility of unforeseen or unpredictable risks and/or possible complications that may result in a catastrophic outcome. The patient indicated having understood very clearly. We have given the patient no guarantees and we have made no promises. Enough time was given to the patient to ask questions, all of which were answered to the patient's satisfaction. Beth Wells has indicated that she wanted to continue with the procedure. Attestation: I, the ordering provider, attest that I have discussed with the patient the benefits, risks, side-effects, alternatives, likelihood of achieving goals, and potential problems during recovery for the procedure that I have provided informed consent. Date  Time: 02/16/2021 12:27 PM  Pre-Procedure Preparation:   Monitoring: As per clinic protocol. Respiration, ETCO2, SpO2, BP, heart rate and rhythm monitor placed and checked for adequate function Safety Precautions: Patient was assessed for positional comfort and pressure points before starting the procedure. Time-out: I initiated and conducted the "Time-out" before starting the procedure, as per protocol. The patient was asked to participate by confirming the accuracy of the "Time Out" information. Verification of the correct person, site, and procedure were performed and confirmed by me, the nursing staff, and the patient. "Time-out" conducted as per Joint Commission's Universal Protocol (UP.01.01.01). Time: 1240  Description of Procedure:          Target Area: Intravenous Approach: Intravenous angiocath approach. Area Prepped: Antecubital DuraPrep (Iodine Povacrylex [0.7% available iodine] and Isopropyl Alcohol, 74% w/w) Safety Precautions: Medications properly checked for expiration dates. SDV (single dose vial) medications used.  Dose Calculation: Lidocaine preparation: 2 grams of IV lidocaine in 500 mL of D5W (4 mg/mL) (0.4% Lidocaine) Maximum Lidocaine Dose: 4 mg/kg x 102 lb 6.4 oz (46.4 kg) =  185.6 mg.  Calculated infusion rate: 185.6 mg divided by 4 mg/mL = 46.4, will decrease to 68mL in 60 minutes (45 mL/hr) Weight: 102 lb 6.4 oz (46.4 kg) Set rate: 55mL/hr Duration of Infusion: 1 hour  Description of the Procedure: Protocol guidelines were followed. The patient was placed in position. Informed consent was obtained.  She was cautioned to be on the look out for prodromal symptoms of a possible impending seizure, such as: new onset tinnitus; perioral, circumoral, or tongue numbness; or a metallic taste in her mouth, lightheadedness; dizziness; visual or auditory disturbances; disorientation; profound drowsiness; or muscle twitching. An IV was started and the patient's dose calculated as above. The infusion was carried out with a nurse at her  side 100% of the time, monitoring her vitals as per protocol. Pre-procedure sedation was started 5-10 minutes before infusion and midazolam was kept at bedside during the entire procedure, along with CPR equipment, as per protocol.  Vitals:   02/16/21 1335 02/16/21 1340 02/16/21 1345 02/16/21 1355  BP:  138/88 (!) 141/82 (!) 145/89 (!) 147/88  Pulse: 85 82 80 84  Resp: 15 15 15 15   Temp:      SpO2: 98% 99% 99% 100%  Weight:      Height:        Start Time: 1246 hrs. End Time:   hrs. Materials: IV infusion pump Medication(s): IV Lidocaine.  Post-operative Assessment:  Post-procedure Vital Signs:  Pulse/HCG Rate: 8481 Temp: (!) 97 F (36.1 C) Resp: 15 BP: (!) 147/88 SpO2: 100 %  EBL: None  Complications: No immediate post-treatment complications observed by team, or reported by patient.  Note: The patient tolerated the entire procedure well. A repeat set of vitals were taken after the procedure and the patient was kept under observation following institutional policy, for this type of procedure. Post-procedural neurological assessment was performed, showing return to baseline, prior to discharge. The patient was provided with post-procedure discharge instructions, including a section on how to identify potential problems. Should any problems arise concerning this procedure, the patient was given instructions to immediately contact us, at any time, without hesitation. In any case, we plan to contact the patient by telephone for a follow-up status report regarding this interventional procedure.  Comments:  No additional relevant information.  Plan of Care   Procedure Orders    No procedure(s) ordered today    Medications ordered for procedure: Meds ordered this encounter  Medications  . lidocaine (cardiac) 2000 mg in dextrose 5% 500 mL (4mg /mL) IV infusion    IV Lidocaine 2 grams in 500 mL of Lactated Ringer's(LR). (4 mg/mL) (0.4% Lidocaine)   Medications administered: We  administered lidocaine.  See the medical record for exact dosing rate.  New Prescriptions   No medications on file   Disposition: Discharge home  Discharge Date & Time: 02/16/2021; 1412 hrs.   Physician-requested Follow-up: Return for Keep sch. appt. Recent Visits Date Type Provider Dept  02/02/21 Office Visit Gillis Santa, MD [facility]  01/06/21 Telemedicine Gillis Santa, MD [facility]  12/13/20 Telemedicine Gillis Santa, MD [facility]  Showing recent visits within past 90 days and meeting all other requirements Today's Visits Date Type Provider Dept  02/16/21 Procedure visit Gillis Santa, MD [facility]  Showing today's visits and meeting all other requirements Future Appointments Date Type Provider Dept  03/22/21 Appointment Gillis Santa, MD [facility]  Showing future appointments within next 90 days and meeting all other requirements  Primary Care Physician: Elza Rafter, MD Location: [facility] Note by: Gillis Santa, MD Date: 02/16/2021; Time: 2:15 PM  Disclaimer:  Medicine is not an exact science. The only guarantee in medicine is that nothing is guaranteed. It is important to note that the decision to proceed with this intervention was based on the information collected from the patient. The Data and conclusions were drawn from the patient's questionnaire, the interview, and the physical examination. Because the information was provided in large part by the patient, it cannot be guaranteed that it has not been purposely or unconsciously manipulated. Every effort has been made to obtain as much relevant data as possible for this evaluation. It is important to note that the conclusions that lead to this procedure are derived in large part from the available data. Always take into account that the treatment will also be dependent on availability of resources and existing treatment  guidelines, considered by other Pain Management Practitioners as being common knowledge and practice, at the time of the  intervention. For Medico-Legal purposes, it is also important to point out that variation in procedural techniques and pharmacological choices are the acceptable norm. The indications, contraindications, technique, and results of the above procedure should only be interpreted and judged by a Board-Certified Interventional Pain Specialist with extensive familiarity and expertise in the same exact procedure and technique.

## 2021-02-17 ENCOUNTER — Telehealth: Payer: Self-pay | Admitting: *Deleted

## 2021-02-17 NOTE — Telephone Encounter (Signed)
Spoke with patient after lidocaine infusion on yesterday, no questions or concerns.

## 2021-02-18 ENCOUNTER — Telehealth: Payer: Self-pay

## 2021-02-18 ENCOUNTER — Encounter: Payer: Self-pay | Admitting: Internal Medicine

## 2021-02-18 NOTE — Telephone Encounter (Signed)
Pt sent the following mychart message. Please advise Dr. Jacinto Reap.   Hi!  I sure do miss seeing you guys!  I have a question...  I was checking into botox (for my face- my eyelids are drooping ) before cancer happened.  Before the nurse at the dermatology office proceeds with anything, she wanted me to get the ok from you.  She told me to ask you about both botox and fillers.  Is it OK?

## 2021-02-18 NOTE — Telephone Encounter (Signed)
Ok with botox. No contraindications from oncology stand point. Dr.B

## 2021-02-25 ENCOUNTER — Other Ambulatory Visit: Payer: Self-pay

## 2021-02-25 ENCOUNTER — Encounter: Payer: Self-pay | Admitting: Surgical

## 2021-02-25 ENCOUNTER — Ambulatory Visit (INDEPENDENT_AMBULATORY_CARE_PROVIDER_SITE_OTHER): Payer: BC Managed Care – PPO | Admitting: Surgical

## 2021-02-25 DIAGNOSIS — Z171 Estrogen receptor negative status [ER-]: Secondary | ICD-10-CM

## 2021-02-25 DIAGNOSIS — Z9011 Acquired absence of right breast and nipple: Secondary | ICD-10-CM

## 2021-02-25 DIAGNOSIS — C50411 Malignant neoplasm of upper-outer quadrant of right female breast: Secondary | ICD-10-CM

## 2021-02-25 MED ORDER — CEPHALEXIN 500 MG PO CAPS: 500.0000 mg | ORAL_CAPSULE | Freq: Four times a day (QID) | ORAL | 0 refills | Status: DC

## 2021-02-25 NOTE — H&P (View-Only) (Signed)
Patient ID: Beth Wells, female    DOB: [birth year], 45 y.o.   MRN: [id number]  Chief Complaint  Patient presents with  . Pre-op Exam      ICD-10-CM   1. Acquired absence of right breast  Z90.11   2. Carcinoma of upper-outer quadrant of right breast in female, estrogen receptor negative ([place])  C50.411    Z17.1     History of Present Illness: Beth Wells is a 45 y.o.  female  with a history of right breast reconstruction for carcinoma of right breast.  She presents for preoperative evaluation for upcoming procedure, removal of right breast tissue expander and placement of implant, left breast mastopexy for symmetry, scheduled for 03/09/21 with Dr. Marla Roe.  The patient has had problems with anesthesia: PONV. Reports one episode of low BP afterwards, which improved with fluids. No other complications. Reports scopolamine patch works well. No history of DVT/PE.  No family history of DVT/PE.  No family or personal history of bleeding or clotting disorders.  Patient is not currently taking any blood thinners.  No history of CVA/MI.   Finished chemo end of April 25th. No hx of radiation.  Summary of Previous Visit: Currently has 330/350 cc in her expander on the right. She is interested in silicone implants.  Would like to be smaller than current expander size, preferred size of expander when she was around 250cc.  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  PMH Significant for: Chronic pain, idiopathic small fiber neuropathy. HTN. ADHD.  Patient is on chronic pain regimen managed by Dr. Gillis Santa. Currently on Tramadol ER and Tramadol 50mg .  The patient gave consent to have this visit done by telemedicine / virtual visit, two identifiers were used to identify patient. This is also consent for access the chart and treat the patient via this visit. The patient is located at work.  I, the provider, am at the office.  We spent 30 minutes together for the visit.   Joined by telephone.   Past Medical History: Allergies: No Known Allergies  Current Medications:  Current Outpatient Medications:  .  ALPHA-LIPOIC ACID PO, Take 500 mg by mouth daily. , Disp: , Rfl:  .  amphetamine-dextroamphetamine (ADDERALL XR) 30 MG 24 hr capsule, Take 30 mg by mouth daily. , Disp: , Rfl:  .  diazepam (VALIUM) 2 MG tablet, Take 1 tablet (2 mg total) by mouth every 12 (twelve) hours as needed for up to 10 doses for muscle spasms., Disp: 10 tablet, Rfl: 0 .  DULoxetine (CYMBALTA) 20 MG capsule, Take 2 capsules (40 mg total) by mouth at bedtime. (Patient taking differently: Take 20 mg by mouth at bedtime.), Disp: 60 capsule, Rfl: 2 .  lisinopril (ZESTRIL) 5 MG tablet, Take 2.5 mg by mouth every evening., Disp: , Rfl:  .  pregabalin (LYRICA) 100 MG capsule, Take 1 capsule (100 mg total) by mouth 2 (two) times daily., Disp: 180 capsule, Rfl: 1 .  Probiotic Product (PROBIOTIC ADVANCED PO), Take 1 capsule by mouth daily. , Disp: , Rfl:  .  sodium fluoride (FLUORISHIELD) 1.1 % GEL dental gel, Place 1 application onto teeth 2 (two) times daily., Disp: , Rfl:  .  traMADol (ULTRAM) 50 MG tablet, Take 1 tablet (50 mg total) by mouth every 12 (twelve) hours as needed for severe pain., Disp: 60 tablet, Rfl: 1 .  traMADol (ULTRAM-ER) 100 MG 24 hr tablet, Take 1 tablet (100 mg total) by mouth daily.  For chronic pain syndrome, Disp: 30 tablet, Rfl: 1 .  trolamine salicylate (ASPERCREME) 10 % cream, Apply 1 application topically as needed for muscle pain., Disp: , Rfl:   Past Medical Problems: Past Medical History:  Diagnosis Date  . Breast cancer ([place]) 08/05/2020  . Carcinoma of upper-outer quadrant of right breast in female, estrogen receptor negative ([place]) 07/13/2020  . Complication of anesthesia    hard to wake up-very dizzy feeling like she was going to pass out  . H/O foot surgery 08/2018  . History of kidney stones    h/o  . Hypertension   . PONV (postoperative nausea and  vomiting)    n/v aftter kidney stone surgery    Past Surgical History: Past Surgical History:  Procedure Laterality Date  . BREAST BIOPSY Right 07/06/2020   u/s bx Q clip path pending  . BREAST RECONSTRUCTION WITH PLACEMENT OF TISSUE EXPANDER AND FLEX HD (ACELLULAR HYDRATED DERMIS) Right 08/05/2020   Procedure: RIGHT BREAST RECONSTRUCTION WITH PLACEMENT OF TISSUE EXPANDER AND FLEX HD (ACELLULAR HYDRATED DERMIS);  Surgeon: Wallace Going, DO;  Location: [facility];  Service: Plastics;  Laterality: Right;  . BUNIONECTOMY Right    titanium pin  . CERVICAL CONE BIOPSY    . DILATION AND EVACUATION N/A 03/12/2018   Procedure: DILATATION AND EVACUATION;  Surgeon: [name], MD;  Location: [facility];  Service: Gynecology;  Laterality: N/A;  . KIDNEY STONE SURGERY    . LAPAROSCOPIC OVARIAN CYSTECTOMY Right 06/07/2019   Procedure: LAPAROSCOPIC OVARIAN CYSTECTOMY;  Surgeon: [name], MD;  Location: [facility];  Service: Gynecology;  Laterality: Right;  . LAPAROSCOPY N/A 06/07/2019   Procedure: LAPAROSCOPY Luvenia Redden BIOPSIES;  Surgeon: [name], MD;  Location: [facility];  Service: Gynecology;  Laterality: N/A;  . PORTACATH PLACEMENT Left 08/05/2020   Procedure: INSERTION PORT-A-CATH;  Surgeon: Herbert Pun, MD;  Location: [facility];  Service: General;  Laterality: Left;  . TOTAL MASTECTOMY Right 08/05/2020   Procedure: TOTAL MASTECTOMY w/ Sentinel Node;  Surgeon: Herbert Pun, MD;  Location: [facility];  Service: General;  Laterality: Right;    Social History: Social History   Socioeconomic History  . Marital status: Married    Spouse name: [name]  . Number of children: 0  . Years of education: Not on file  . Highest education level: Not on file  Occupational History  . Occupation: Market researcher: [employer]  Tobacco Use  . Smoking status: Never Smoker  . Smokeless tobacco: Never Used  Vaping Use  . Vaping Use:  Never used  Substance and Sexual Activity  . Alcohol use: Not Currently  . Drug use: Never  . Sexual activity: Yes    Birth control/protection: Condom  Other Topics Concern  . 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 Lives in [place].    Social Determinants of Health   Financial Resource Strain: Low Risk   . Difficulty of Paying Living Expenses: Not hard at all  Food Insecurity: Not on file  Transportation Needs: No Transportation Needs  . Lack of Transportation (Medical): No  . Lack of Transportation (Non-Medical): No  Physical Activity: Not on file  Stress: Stress Concern Present  . Feeling of Stress : Very much  Social Connections: Not on file  Intimate Partner Violence: Not on file    Family History: Family History  Problem Relation Age of Onset  . Cancer Other  great aunt- GI cancer  . Breast cancer Neg Hx     Review of Systems: Review of Systems  Constitutional: Negative.   Respiratory: Negative.   Cardiovascular: Negative.   Gastrointestinal: Negative.   Neurological: Positive for sensory change. Negative for dizziness and headaches.    Physical Exam: Vital Signs There were no vitals taken for this visit.  Physical Exam Psychiatric:        Mood and Affect: Mood normal.        Behavior: Behavior normal.    Assessment/Plan: The patient is scheduled for exchange of right breast expander for right breast implant and left breast mastopexy with Dr. Marla Roe.  Risks, benefits, and alternatives of procedure discussed, questions answered and consent obtained.    Smoking Status: non smoker; Counseling Given? NA Last Mammogram: 07/15/20; Results: negative left breast, no sign of malignancy.  Caprini Score: 6 high; Risk Factors include: age, BMI > 25, hx of right breast cancer and length of planned surgery. Recommendation for mechanical and pharmacological prophylaxis  while hospitalized. Encourage early ambulation.   Pictures obtained: @consult   Post-op Rx sent to pharmacy: keflex. Will await pre-op clearance/pain contract clearance form from Pain management provider Dr. Gillis Santa prior to send post-op pain medications.  Patient was provided with the General Surgical Risk consent document and Pain Medication Agreement prior to their appointment.  They had adequate time to read through the risk consent documents and Pain Medication Agreement.  All of their questions were answered to their satisfaction.  Recommended calling if they have any further questions.  Risk consent form and Pain Medication Agreement to be scanned into patient's chart after reviewing with patient again on day of surgery.  The risks that can be encountered with and after placement of a breast implant were discussed and include the following but not limited to these: bleeding, infection, delayed healing, anesthesia risks, skin sensation changes, injury to structures including nerves, blood vessels, and muscles which may be temporary or permanent, allergies to tape, suture materials and glues, blood products, topical preparations or injected agents, skin contour irregularities, skin discoloration and swelling, deep vein thrombosis, cardiac and pulmonary complications, pain, which may persist, fluid accumulation, wrinkling of the skin over the implanmt, changes in nipple or breast sensation, implant leakage or rupture, faulty position of the implant, persistent pain, formation of tight scar tissue around the implant (capsular contracture).  The risk that can be encountered with mastopexy were discussed and include the following but not limited to these:  Breast asymmetry, fluid accumulation, firmness of the breast, inability to breast feed, loss of nipple or areola, skin loss, decrease or no nipple sensation, fat necrosis of the breast tissue, bleeding, infection, healing delay.  There are risks  of anesthesia, changes to skin sensation and injury to nerves or blood vessels.  The muscle can be temporarily or permanently injured.  You may have an allergic reaction to tape, suture, glue, blood products which can result in skin discoloration, swelling, pain, skin lesions, poor healing.  Any of these can lead to the need for revisonal surgery or stage procedures.  A reduction has potential to interfere with diagnostic procedures. This procedure is best done when the breast is fully developed.  Changes in the breast will continue to occur over time.  Pregnancy can alter the outcomes of previous breast reduction surgery, weight gain and weigh loss can also effect the long term appearance.    Electronically signed by: Carola Rhine Kaien Pezzullo, PA-C 02/25/2021 10:40 AM

## 2021-02-25 NOTE — Progress Notes (Signed)
Patient ID: Beth Wells, female    DOB: Oct 05, 1975, 45 y.o.   MRN: [id number]  Chief Complaint  Patient presents with  . Pre-op Exam      ICD-10-CM   1. Acquired absence of right breast  Z90.11   2. Carcinoma of upper-outer quadrant of right breast in female, estrogen receptor negative ([place])  C50.411    Z17.1     History of Present Illness: Beth Wells is a 45 y.o.  female  with a history of right breast reconstruction for carcinoma of right breast.  She presents for preoperative evaluation for upcoming procedure, removal of right breast tissue expander and placement of implant, left breast mastopexy for symmetry, scheduled for 03/09/21 with Dr. Marla Roe.  The patient has had problems with anesthesia: PONV. Reports one episode of low BP afterwards, which improved with fluids. No other complications. Reports scopolamine patch works well. No history of DVT/PE.  No family history of DVT/PE.  No family or personal history of bleeding or clotting disorders.  Patient is not currently taking any blood thinners.  No history of CVA/MI.   Finished chemo end of April 25th. No hx of radiation.  Summary of Previous Visit: Currently has 330/350 cc in her expander on the right. She is interested in silicone implants.  Would like to be smaller than current expander size, preferred size of expander when she was around 250cc.  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  PMH Significant for: Chronic pain, idiopathic small fiber neuropathy. HTN. ADHD.  Patient is on chronic pain regimen managed by Dr. Gillis Santa. Currently on Tramadol ER and Tramadol 50mg .  The patient gave consent to have this visit done by telemedicine / virtual visit, two identifiers were used to identify patient. This is also consent for access the chart and treat the patient via this visit. The patient is located at work.  I, the provider, am at the office.  We spent 30 minutes together for the visit.   Joined by telephone.   Past Medical History: Allergies: No Known Allergies  Current Medications:  Current Outpatient Medications:  .  ALPHA-LIPOIC ACID PO, Take 500 mg by mouth daily. , Disp: , Rfl:  .  amphetamine-dextroamphetamine (ADDERALL XR) 30 MG 24 hr capsule, Take 30 mg by mouth daily. , Disp: , Rfl:  .  diazepam (VALIUM) 2 MG tablet, Take 1 tablet (2 mg total) by mouth every 12 (twelve) hours as needed for up to 10 doses for muscle spasms., Disp: 10 tablet, Rfl: 0 .  DULoxetine (CYMBALTA) 20 MG capsule, Take 2 capsules (40 mg total) by mouth at bedtime. (Patient taking differently: Take 20 mg by mouth at bedtime.), Disp: 60 capsule, Rfl: 2 .  lisinopril (ZESTRIL) 5 MG tablet, Take 2.5 mg by mouth every evening., Disp: , Rfl:  .  pregabalin (LYRICA) 100 MG capsule, Take 1 capsule (100 mg total) by mouth 2 (two) times daily., Disp: 180 capsule, Rfl: 1 .  Probiotic Product (PROBIOTIC ADVANCED PO), Take 1 capsule by mouth daily. , Disp: , Rfl:  .  sodium fluoride (FLUORISHIELD) 1.1 % GEL dental gel, Place 1 application onto teeth 2 (two) times daily., Disp: , Rfl:  .  traMADol (ULTRAM) 50 MG tablet, Take 1 tablet (50 mg total) by mouth every 12 (twelve) hours as needed for severe pain., Disp: 60 tablet, Rfl: 1 .  traMADol (ULTRAM-ER) 100 MG 24 hr tablet, Take 1 tablet (100 mg total) by mouth daily.  For chronic pain syndrome, Disp: 30 tablet, Rfl: 1 .  trolamine salicylate (ASPERCREME) 10 % cream, Apply 1 application topically as needed for muscle pain., Disp: , Rfl:   Past Medical Problems: Past Medical History:  Diagnosis Date  . Breast cancer ([place]) 08/05/2020  . Carcinoma of upper-outer quadrant of right breast in female, estrogen receptor negative ([place]) 07/13/2020  . Complication of anesthesia    hard to wake up-very dizzy feeling like she was going to pass out  . H/O foot surgery 08/2018  . History of kidney stones    h/o  . Hypertension   . PONV (postoperative nausea and  vomiting)    n/v aftter kidney stone surgery    Past Surgical History: Past Surgical History:  Procedure Laterality Date  . BREAST BIOPSY Right 07/06/2020   u/s bx Q clip path pending  . BREAST RECONSTRUCTION WITH PLACEMENT OF TISSUE EXPANDER AND FLEX HD (ACELLULAR HYDRATED DERMIS) Right 08/05/2020   Procedure: RIGHT BREAST RECONSTRUCTION WITH PLACEMENT OF TISSUE EXPANDER AND FLEX HD (ACELLULAR HYDRATED DERMIS);  Surgeon: Wallace Going, DO;  Location: [facility];  Service: Plastics;  Laterality: Right;  . BUNIONECTOMY Right    titanium pin  . CERVICAL CONE BIOPSY    . DILATION AND EVACUATION N/A 03/12/2018   Procedure: DILATATION AND EVACUATION;  Surgeon: [name], MD;  Location: [facility];  Service: Gynecology;  Laterality: N/A;  . KIDNEY STONE SURGERY    . LAPAROSCOPIC OVARIAN CYSTECTOMY Right 06/07/2019   Procedure: LAPAROSCOPIC OVARIAN CYSTECTOMY;  Surgeon: [name], MD;  Location: [facility];  Service: Gynecology;  Laterality: Right;  . LAPAROSCOPY N/A 06/07/2019   Procedure: LAPAROSCOPY Luvenia Redden BIOPSIES;  Surgeon: [name], MD;  Location: [facility];  Service: Gynecology;  Laterality: N/A;  . PORTACATH PLACEMENT Left 08/05/2020   Procedure: INSERTION PORT-A-CATH;  Surgeon: Herbert Pun, MD;  Location: [facility];  Service: General;  Laterality: Left;  . TOTAL MASTECTOMY Right 08/05/2020   Procedure: TOTAL MASTECTOMY w/ Sentinel Node;  Surgeon: Herbert Pun, MD;  Location: [facility];  Service: General;  Laterality: Right;    Social History: Social History   Socioeconomic History  . Marital status: Married    Spouse name: [name]  . Number of children: 0  . Years of education: Not on file  . Highest education level: Not on file  Occupational History  . Occupation: Market researcher: [employer]  Tobacco Use  . Smoking status: Never Smoker  . Smokeless tobacco: Never Used  Vaping Use  . Vaping Use:  Never used  Substance and Sexual Activity  . Alcohol use: Not Currently  . Drug use: Never  . Sexual activity: Yes    Birth control/protection: Condom  Other Topics Concern  . 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 Lives in [place].    Social Determinants of Health   Financial Resource Strain: Low Risk   . Difficulty of Paying Living Expenses: Not hard at all  Food Insecurity: Not on file  Transportation Needs: No Transportation Needs  . Lack of Transportation (Medical): No  . Lack of Transportation (Non-Medical): No  Physical Activity: Not on file  Stress: Stress Concern Present  . Feeling of Stress : Very much  Social Connections: Not on file  Intimate Partner Violence: Not on file    Family History: Family History  Problem Relation Age of Onset  . Cancer Other  great aunt- GI cancer  . Breast cancer Neg Hx     Review of Systems: Review of Systems  Constitutional: Negative.   Respiratory: Negative.   Cardiovascular: Negative.   Gastrointestinal: Negative.   Neurological: Positive for sensory change. Negative for dizziness and headaches.    Physical Exam: Vital Signs There were no vitals taken for this visit.  Physical Exam Psychiatric:        Mood and Affect: Mood normal.        Behavior: Behavior normal.    Assessment/Plan: The patient is scheduled for exchange of right breast expander for right breast implant and left breast mastopexy with Dr. Marla Roe.  Risks, benefits, and alternatives of procedure discussed, questions answered and consent obtained.    Smoking Status: non smoker; Counseling Given? NA Last Mammogram: 07/15/20; Results: negative left breast, no sign of malignancy.  Caprini Score: 6 high; Risk Factors include: age, BMI > 25, hx of right breast cancer and length of planned surgery. Recommendation for mechanical and pharmacological prophylaxis  while hospitalized. Encourage early ambulation.   Pictures obtained: @consult   Post-op Rx sent to pharmacy: keflex. Will await pre-op clearance/pain contract clearance form from Pain management provider Dr. Gillis Santa prior to send post-op pain medications.  Patient was provided with the General Surgical Risk consent document and Pain Medication Agreement prior to their appointment.  They had adequate time to read through the risk consent documents and Pain Medication Agreement.  All of their questions were answered to their satisfaction.  Recommended calling if they have any further questions.  Risk consent form and Pain Medication Agreement to be scanned into patient's chart after reviewing with patient again on day of surgery.  The risks that can be encountered with and after placement of a breast implant were discussed and include the following but not limited to these: bleeding, infection, delayed healing, anesthesia risks, skin sensation changes, injury to structures including nerves, blood vessels, and muscles which may be temporary or permanent, allergies to tape, suture materials and glues, blood products, topical preparations or injected agents, skin contour irregularities, skin discoloration and swelling, deep vein thrombosis, cardiac and pulmonary complications, pain, which may persist, fluid accumulation, wrinkling of the skin over the implanmt, changes in nipple or breast sensation, implant leakage or rupture, faulty position of the implant, persistent pain, formation of tight scar tissue around the implant (capsular contracture).  The risk that can be encountered with mastopexy were discussed and include the following but not limited to these:  Breast asymmetry, fluid accumulation, firmness of the breast, inability to breast feed, loss of nipple or areola, skin loss, decrease or no nipple sensation, fat necrosis of the breast tissue, bleeding, infection, healing delay.  There are risks  of anesthesia, changes to skin sensation and injury to nerves or blood vessels.  The muscle can be temporarily or permanently injured.  You may have an allergic reaction to tape, suture, glue, blood products which can result in skin discoloration, swelling, pain, skin lesions, poor healing.  Any of these can lead to the need for revisonal surgery or stage procedures.  A reduction has potential to interfere with diagnostic procedures. This procedure is best done when the breast is fully developed.  Changes in the breast will continue to occur over time.  Pregnancy can alter the outcomes of previous breast reduction surgery, weight gain and weigh loss can also effect the long term appearance.    Electronically signed by: Carola Rhine Amma Crear, PA-C 02/25/2021 10:40 AM

## 2021-03-01 ENCOUNTER — Other Ambulatory Visit: Payer: Self-pay

## 2021-03-01 ENCOUNTER — Telehealth: Payer: Self-pay | Admitting: Plastic Surgery

## 2021-03-01 ENCOUNTER — Encounter (HOSPITAL_BASED_OUTPATIENT_CLINIC_OR_DEPARTMENT_OTHER): Payer: Self-pay | Admitting: Plastic Surgery

## 2021-03-01 NOTE — Telephone Encounter (Signed)
Left message for Dr. Elwyn Lade nurse, D. Donneta Romberg, RN, to return my call regarding surgery clearance. Requested advise regarding pain management/pain contract, as it appears Dr. Holley Raring is seeing the patient for chronic pain, but note on clearance states "This should be completed by PCP." Clarification needed for PCP versus Dr. Holley Raring for pain management. Will await return call to discuss the surgery clearance.

## 2021-03-07 ENCOUNTER — Other Ambulatory Visit: Payer: Self-pay

## 2021-03-07 ENCOUNTER — Other Ambulatory Visit: Payer: BC Managed Care – PPO

## 2021-03-07 ENCOUNTER — Other Ambulatory Visit
Admission: RE | Admit: 2021-03-07 | Discharge: 2021-03-07 | Disposition: A | Discharge status: Home / Self Care | Payer: BC Managed Care – PPO | Source: Ambulatory Visit | Attending: Plastic Surgery | Admitting: Plastic Surgery

## 2021-03-07 DIAGNOSIS — Z01812 Encounter for preprocedural laboratory examination: Secondary | ICD-10-CM | POA: Insufficient documentation

## 2021-03-07 DIAGNOSIS — Z20822 Contact with and (suspected) exposure to covid-19: Secondary | ICD-10-CM | POA: Insufficient documentation

## 2021-03-07 LAB — SARS CORONAVIRUS 2 (TAT 6-24 HRS): SARS Coronavirus 2: NEGATIVE

## 2021-03-08 ENCOUNTER — Other Ambulatory Visit: Payer: Self-pay | Admitting: Surgical

## 2021-03-08 MED ORDER — HYDROCODONE-ACETAMINOPHEN 5-325 MG PO TABS: 1.0000 | ORAL_TABLET | Freq: Four times a day (QID) | ORAL | 0 refills | Status: AC | PRN

## 2021-03-08 NOTE — Progress Notes (Signed)
Post-op pain meds, cleared by pain mgmt provider

## 2021-03-09 ENCOUNTER — Encounter (HOSPITAL_BASED_OUTPATIENT_CLINIC_OR_DEPARTMENT_OTHER): Payer: Self-pay | Admitting: Plastic Surgery

## 2021-03-09 ENCOUNTER — Ambulatory Visit (HOSPITAL_BASED_OUTPATIENT_CLINIC_OR_DEPARTMENT_OTHER): Payer: BC Managed Care – PPO | Admitting: Anesthesiology

## 2021-03-09 ENCOUNTER — Encounter (HOSPITAL_BASED_OUTPATIENT_CLINIC_OR_DEPARTMENT_OTHER)
Admission: RE | Disposition: A | Discharge status: Home / Self Care | Payer: Self-pay | Source: Home / Self Care | Attending: Plastic Surgery

## 2021-03-09 ENCOUNTER — Other Ambulatory Visit: Payer: Self-pay

## 2021-03-09 ENCOUNTER — Ambulatory Visit (HOSPITAL_BASED_OUTPATIENT_CLINIC_OR_DEPARTMENT_OTHER)
Admission: RE | Admit: 2021-03-09 | Discharge: 2021-03-09 | Disposition: A | Discharge status: Home / Self Care | Payer: BC Managed Care – PPO | Attending: Plastic Surgery | Admitting: Plastic Surgery

## 2021-03-09 DIAGNOSIS — N651 Disproportion of reconstructed breast: Secondary | ICD-10-CM | POA: Diagnosis not present

## 2021-03-09 DIAGNOSIS — Z9221 Personal history of antineoplastic chemotherapy: Secondary | ICD-10-CM | POA: Insufficient documentation

## 2021-03-09 DIAGNOSIS — F909 Attention-deficit hyperactivity disorder, unspecified type: Secondary | ICD-10-CM | POA: Diagnosis not present

## 2021-03-09 DIAGNOSIS — Z79899 Other long term (current) drug therapy: Secondary | ICD-10-CM | POA: Insufficient documentation

## 2021-03-09 DIAGNOSIS — G8929 Other chronic pain: Secondary | ICD-10-CM | POA: Diagnosis not present

## 2021-03-09 DIAGNOSIS — Z9011 Acquired absence of right breast and nipple: Secondary | ICD-10-CM | POA: Diagnosis not present

## 2021-03-09 DIAGNOSIS — Z853 Personal history of malignant neoplasm of breast: Secondary | ICD-10-CM | POA: Diagnosis not present

## 2021-03-09 DIAGNOSIS — G608 Other hereditary and idiopathic neuropathies: Secondary | ICD-10-CM | POA: Insufficient documentation

## 2021-03-09 DIAGNOSIS — C50411 Malignant neoplasm of upper-outer quadrant of right female breast: Secondary | ICD-10-CM | POA: Diagnosis not present

## 2021-03-09 DIAGNOSIS — I10 Essential (primary) hypertension: Secondary | ICD-10-CM | POA: Insufficient documentation

## 2021-03-09 DIAGNOSIS — Z79891 Long term (current) use of opiate analgesic: Secondary | ICD-10-CM | POA: Diagnosis not present

## 2021-03-09 HISTORY — PX: RECONSTRUCTION BREAST IMMEDIATE / DELAYED W/ TISSUE EXPANDER: SUR1077

## 2021-03-09 HISTORY — PX: MASTOPEXY: SHX5358

## 2021-03-09 HISTORY — PX: REMOVAL OF TISSUE EXPANDER AND PLACEMENT OF IMPLANT: SHX6457

## 2021-03-09 LAB — POCT PREGNANCY, URINE: Preg Test, Ur: NEGATIVE

## 2021-03-09 SURGERY — REMOVAL, TISSUE EXPANDER, BREAST, WITH IMPLANT INSERTION
Anesthesia: General | Site: Breast | Laterality: Right

## 2021-03-09 MED ORDER — EPHEDRINE SULFATE 50 MG/ML IJ SOLN
INTRAMUSCULAR | Status: DC | PRN
  Administered 2021-03-09: 10 mg via INTRAVENOUS

## 2021-03-09 MED ORDER — SODIUM CHLORIDE (PF) 0.9 % IJ SOLN
INTRAMUSCULAR | Status: AC
  Filled 2021-03-09: qty 10

## 2021-03-09 MED ORDER — SUCCINYLCHOLINE CHLORIDE 200 MG/10ML IV SOSY
PREFILLED_SYRINGE | INTRAVENOUS | Status: AC
  Filled 2021-03-09: qty 10

## 2021-03-09 MED ORDER — PHENYLEPHRINE 40 MCG/ML (10ML) SYRINGE FOR IV PUSH (FOR BLOOD PRESSURE SUPPORT)
PREFILLED_SYRINGE | INTRAVENOUS | Status: AC
  Filled 2021-03-09: qty 10

## 2021-03-09 MED ORDER — LIDOCAINE HCL (PF) 1 % IJ SOLN
INTRAMUSCULAR | Status: AC
  Filled 2021-03-09: qty 30

## 2021-03-09 MED ORDER — DROPERIDOL 2.5 MG/ML IJ SOLN
INTRAMUSCULAR | Status: DC | PRN
  Administered 2021-03-09: .625 mg via INTRAVENOUS

## 2021-03-09 MED ORDER — SODIUM CHLORIDE 0.9% FLUSH: 3.0000 mL | Freq: Two times a day (BID) | INTRAVENOUS | Status: DC

## 2021-03-09 MED ORDER — DEXAMETHASONE SODIUM PHOSPHATE 10 MG/ML IJ SOLN
INTRAMUSCULAR | Status: AC
  Filled 2021-03-09: qty 1

## 2021-03-09 MED ORDER — PROPOFOL 10 MG/ML IV BOLUS
INTRAVENOUS | Status: DC | PRN
  Administered 2021-03-09: 100 mg via INTRAVENOUS

## 2021-03-09 MED ORDER — SODIUM CHLORIDE 0.9% FLUSH: 3.0000 mL | INTRAVENOUS | Status: DC | PRN

## 2021-03-09 MED ORDER — EPHEDRINE 5 MG/ML INJ
INTRAVENOUS | Status: AC
  Filled 2021-03-09: qty 10

## 2021-03-09 MED ORDER — FENTANYL CITRATE (PF) 100 MCG/2ML IJ SOLN
INTRAMUSCULAR | Status: DC | PRN
  Administered 2021-03-09 (×2): 50 ug via INTRAVENOUS

## 2021-03-09 MED ORDER — ACETAMINOPHEN 325 MG RE SUPP: 650.0000 mg | RECTAL | Status: DC | PRN

## 2021-03-09 MED ORDER — LACTATED RINGERS IV SOLN: INTRAVENOUS | Status: DC

## 2021-03-09 MED ORDER — OXYCODONE HCL 5 MG PO TABS: 5.0000 mg | ORAL_TABLET | ORAL | Status: DC | PRN

## 2021-03-09 MED ORDER — FENTANYL CITRATE (PF) 100 MCG/2ML IJ SOLN
INTRAMUSCULAR | Status: AC
  Filled 2021-03-09: qty 2

## 2021-03-09 MED ORDER — DEXAMETHASONE SODIUM PHOSPHATE 4 MG/ML IJ SOLN
INTRAMUSCULAR | Status: DC | PRN
  Administered 2021-03-09: 10 mg via INTRAVENOUS

## 2021-03-09 MED ORDER — LIDOCAINE-EPINEPHRINE 1 %-1:100000 IJ SOLN
INTRAMUSCULAR | Status: DC | PRN
  Administered 2021-03-09: 20 mL via INTRAMUSCULAR

## 2021-03-09 MED ORDER — FENTANYL CITRATE (PF) 100 MCG/2ML IJ SOLN
25.0000 ug | INTRAMUSCULAR | Status: DC | PRN
Start: 2021-03-09 — End: 2021-03-09
  Administered 2021-03-09: 50 ug via INTRAVENOUS

## 2021-03-09 MED ORDER — LIDOCAINE-EPINEPHRINE 1 %-1:100000 IJ SOLN
INTRAMUSCULAR | Status: AC
  Filled 2021-03-09: qty 1

## 2021-03-09 MED ORDER — CHLORHEXIDINE GLUCONATE CLOTH 2 % EX PADS: 6.0000 | MEDICATED_PAD | Freq: Once | CUTANEOUS | Status: DC

## 2021-03-09 MED ORDER — MIDAZOLAM HCL 5 MG/5ML IJ SOLN
INTRAMUSCULAR | Status: DC | PRN
  Administered 2021-03-09: 2 mg via INTRAVENOUS

## 2021-03-09 MED ORDER — CEFAZOLIN SODIUM-DEXTROSE 2-4 GM/100ML-% IV SOLN
2.0000 g | INTRAVENOUS | Status: AC
  Administered 2021-03-09: 2 g via INTRAVENOUS

## 2021-03-09 MED ORDER — EPINEPHRINE PF 1 MG/ML IJ SOLN
INTRAMUSCULAR | Status: AC
  Filled 2021-03-09: qty 1

## 2021-03-09 MED ORDER — LIDOCAINE HCL (PF) 2 % IJ SOLN
INTRAMUSCULAR | Status: AC
  Filled 2021-03-09: qty 5

## 2021-03-09 MED ORDER — CEFAZOLIN SODIUM-DEXTROSE 2-4 GM/100ML-% IV SOLN
INTRAVENOUS | Status: AC
  Filled 2021-03-09: qty 100

## 2021-03-09 MED ORDER — ONDANSETRON HCL 4 MG/2ML IJ SOLN
INTRAMUSCULAR | Status: AC
  Filled 2021-03-09: qty 2

## 2021-03-09 MED ORDER — SODIUM CHLORIDE 0.9 % IV SOLN: 250.0000 mL | INTRAVENOUS | Status: DC | PRN

## 2021-03-09 MED ORDER — ACETAMINOPHEN 325 MG PO TABS: 650.0000 mg | ORAL_TABLET | ORAL | Status: DC | PRN

## 2021-03-09 MED ORDER — MIDAZOLAM HCL 2 MG/2ML IJ SOLN
INTRAMUSCULAR | Status: AC
  Filled 2021-03-09: qty 2

## 2021-03-09 SURGICAL SUPPLY — 77 items
ADH SKN CLS APL DERMABOND .7 (GAUZE/BANDAGES/DRESSINGS) ×4
BAG DECANTER FOR FLEXI CONT (MISCELLANEOUS) ×4 IMPLANT
BINDER BREAST LRG (GAUZE/BANDAGES/DRESSINGS) IMPLANT
BINDER BREAST MEDIUM (GAUZE/BANDAGES/DRESSINGS) ×4 IMPLANT
BINDER BREAST XLRG (GAUZE/BANDAGES/DRESSINGS) IMPLANT
BINDER BREAST XXLRG (GAUZE/BANDAGES/DRESSINGS) IMPLANT
BIOPATCH RED 1 DISK 7.0 (GAUZE/BANDAGES/DRESSINGS) IMPLANT
BIOPATCH RED 1IN DISK 7.0MM (GAUZE/BANDAGES/DRESSINGS)
BLADE HEX COATED 2.75 (ELECTRODE) ×4 IMPLANT
BLADE SURG 10 STRL SS (BLADE) ×8 IMPLANT
BLADE SURG 15 STRL LF DISP TIS (BLADE) ×2 IMPLANT
BLADE SURG 15 STRL SS (BLADE) ×4
BNDG GAUZE ELAST 4 BULKY (GAUZE/BANDAGES/DRESSINGS) IMPLANT
CANISTER SUCT 1200ML W/VALVE (MISCELLANEOUS) ×4 IMPLANT
CLOSURE WOUND 1/2 X4 (GAUZE/BANDAGES/DRESSINGS) ×1
COVER BACK TABLE 60X90IN (DRAPES) ×4 IMPLANT
COVER MAYO STAND STRL (DRAPES) ×4 IMPLANT
COVER WAND RF STERILE (DRAPES) IMPLANT
DECANTER SPIKE VIAL GLASS SM (MISCELLANEOUS) IMPLANT
DERMABOND ADVANCED (GAUZE/BANDAGES/DRESSINGS) ×4
DERMABOND ADVANCED .7 DNX12 (GAUZE/BANDAGES/DRESSINGS) ×4 IMPLANT
DRAIN CHANNEL 19F RND (DRAIN) IMPLANT
DRAPE LAPAROSCOPIC ABDOMINAL (DRAPES) ×4 IMPLANT
DRSG PAD ABDOMINAL 8X10 ST (GAUZE/BANDAGES/DRESSINGS) ×8 IMPLANT
DRSG TEGADERM 2-3/8X2-3/4 SM (GAUZE/BANDAGES/DRESSINGS) IMPLANT
ELECT BLADE 4.0 EZ CLEAN MEGAD (MISCELLANEOUS)
ELECT COATED BLADE 2.86 ST (ELECTRODE) ×4 IMPLANT
ELECT REM PT RETURN 9FT ADLT (ELECTROSURGICAL) ×4
ELECTRODE BLDE 4.0 EZ CLN MEGD (MISCELLANEOUS) IMPLANT
ELECTRODE REM PT RTRN 9FT ADLT (ELECTROSURGICAL) ×2 IMPLANT
EVACUATOR SILICONE 100CC (DRAIN) IMPLANT
FUNNEL KELLER 2 DISP (MISCELLANEOUS) ×4 IMPLANT
GAUZE SPONGE 4X4 12PLY STRL (GAUZE/BANDAGES/DRESSINGS) ×4 IMPLANT
GAUZE SPONGE 4X4 12PLY STRL LF (GAUZE/BANDAGES/DRESSINGS) IMPLANT
GLOVE SURG ENC MOIS LTX SZ6.5 (GLOVE) ×24 IMPLANT
GOWN STRL REUS W/ TWL LRG LVL3 (GOWN DISPOSABLE) ×4 IMPLANT
GOWN STRL REUS W/TWL LRG LVL3 (GOWN DISPOSABLE) ×8
IMPL GEL 275CC SMOOTH HIGH (Breast) ×2 IMPLANT
IMPLANT GEL 275CC SMOOTH HIGH (Breast) ×4 IMPLANT
IV NS 1000ML (IV SOLUTION)
IV NS 1000ML BAXH (IV SOLUTION) IMPLANT
NDL SAFETY ECLIPSE 18X1.5 (NEEDLE) ×2 IMPLANT
NEEDLE HYPO 18GX1.5 SHARP (NEEDLE) ×4
NEEDLE HYPO 25X1 1.5 SAFETY (NEEDLE) ×4 IMPLANT
NS IRRIG 1000ML POUR BTL (IV SOLUTION) ×4 IMPLANT
PACK BASIN DAY SURGERY FS (CUSTOM PROCEDURE TRAY) ×4 IMPLANT
PENCIL SMOKE EVACUATOR (MISCELLANEOUS) ×4 IMPLANT
PIN SAFETY STERILE (MISCELLANEOUS) IMPLANT
SIZER BREAST GEL REUSE 300CC (SIZER) ×4
SIZER BREAST REUSE 275CC (SIZER) ×4
SIZER BRST GEL REUSE 300CC (SIZER) ×2 IMPLANT
SIZER BRST REUSE 275CC (SIZER) ×2 IMPLANT
SLEEVE SCD COMPRESS KNEE MED (STOCKING) ×4 IMPLANT
SPONGE LAP 18X18 RF (DISPOSABLE) ×8 IMPLANT
STRIP CLOSURE SKIN 1/2X4 (GAUZE/BANDAGES/DRESSINGS) ×3 IMPLANT
STRIP SUTURE WOUND CLOSURE 1/2 (MISCELLANEOUS) IMPLANT
SUT MNCRL AB 3-0 PS2 18 (SUTURE) ×8 IMPLANT
SUT MNCRL AB 4-0 PS2 18 (SUTURE) ×24 IMPLANT
SUT MON AB 3-0 SH 27 (SUTURE)
SUT MON AB 3-0 SH27 (SUTURE) IMPLANT
SUT MON AB 5-0 PS2 18 (SUTURE) ×8 IMPLANT
SUT PDS 3-0 CT2 (SUTURE) ×4
SUT PDS AB 2-0 CT2 27 (SUTURE) IMPLANT
SUT PDS II 3-0 CT2 27 ABS (SUTURE) ×2 IMPLANT
SUT SILK 3 0 PS 1 (SUTURE) IMPLANT
SUT VIC AB 3-0 SH 27 (SUTURE) ×4
SUT VIC AB 3-0 SH 27X BRD (SUTURE) ×2 IMPLANT
SUT VICRYL 4-0 PS2 18IN ABS (SUTURE) ×4 IMPLANT
SYR BULB IRRIG 60ML STRL (SYRINGE) ×4 IMPLANT
SYR CONTROL 10ML LL (SYRINGE) ×4 IMPLANT
TAPE MEASURE VINYL STERILE (MISCELLANEOUS) ×4 IMPLANT
TOWEL GREEN STERILE FF (TOWEL DISPOSABLE) ×8 IMPLANT
TRAY DSU PREP LF (CUSTOM PROCEDURE TRAY) ×4 IMPLANT
TUBE CONNECTING 20'X1/4 (TUBING) ×1
TUBE CONNECTING 20X1/4 (TUBING) ×3 IMPLANT
UNDERPAD 30X36 HEAVY ABSORB (UNDERPADS AND DIAPERS) ×8 IMPLANT
YANKAUER SUCT BULB TIP NO VENT (SUCTIONS) ×4 IMPLANT

## 2021-03-09 NOTE — Discharge Instructions (Addendum)
INSTRUCTIONS FOR AFTER SURGERY   You will likely have some questions about what to expect following your operation.  The following information will help you and your family understand what to expect when you are discharged from the hospital.  Following these guidelines will help ensure a smooth recovery and reduce risks of complications.  Postoperative instructions include information on: diet, wound care, medications and physical activity.  AFTER SURGERY Expect to go home after the procedure.  In some cases, you may need to spend one night in the hospital for observation.  DIET This surgery does not require a specific diet.  However, I have to mention that the healthier you eat the better your body can start healing. It is important to increasing your protein intake.  This means limiting the foods with added sugar.  Focus on fruits and vegetables and some meat. It is very important to drink water after your surgery.  If your urine is bright yellow, then it is concentrated, and you need to drink more water.  As a general rule after surgery, you should have 8 ounces of water every hour while awake.  If you find you are persistently nauseated or unable to take in liquids let us know.  NO TOBACCO USE or EXPOSURE.  This will slow your healing process and increase the risk of a wound.  WOUND CARE If you don't have a drain: You can shower the day after surgery.  Use fragrance free soap.  Dial, [place], [place] and Cetaphil are usually mild on the skin.  If you have steri-strips / tape directly attached to your skin leave them in place. It is OK to get these wet.  No baths, pools or hot tubs for two weeks. We close your incision to leave the smallest and best-looking scar. No ointment or creams on your incisions until given the go ahead.  Especially not Neosporin (Too many skin reactions with this one).  A few weeks after surgery you can use Mederma and start massaging the scar. We ask you to wear your binder or  sports bra for the first 6 weeks around the clock, including while sleeping. This provides added comfort and helps reduce the fluid accumulation at the surgery site.  ACTIVITY No heavy lifting until cleared by the doctor.  It is OK to walk and climb stairs. In fact, moving your legs is very important to decrease your risk of a blood clot.  It will also help keep you from getting deconditioned.  Every 1 to 2 hours get up and walk for 5 minutes. This will help with a quicker recovery back to normal.  Let pain be your guide so you don't do too much.  NO, you cannot do the spring cleaning and don't plan on taking care of anyone else.  This is your time for TLC.   WORK Everyone returns to work at different times. As a rough guide, most people take at least 1 - 2 weeks off prior to returning to work. If you need documentation for your job, bring the forms to your postoperative follow up visit.  DRIVING Arrange for someone to bring you home from the hospital.  You may be able to drive a few days after surgery but not while taking any narcotics or valium.  BOWEL MOVEMENTS Constipation can occur after anesthesia and while taking pain medication.  It is important to stay ahead for your comfort.  We recommend taking Milk of Magnesia (2 tablespoons; twice a day) while taking  the pain pills.  SEROMA This is fluid your body tried to put in the surgical site.  This is normal but if it creates excessive pain and swelling let us know.  It usually decreases in a few weeks.  MEDICATIONS and PAIN CONTROL At your preoperative visit for you history and physical you were given the following medications: An antibiotic: Start this medication when you get home and take according to the instructions on the bottle. Zofran 4 mg:  This is to treat nausea and vomiting.  You can take this every 6 hours as needed and only if needed. Norco (hydrocodone/acetaminophen) 5/325 mg:  This is only to be used after you have taken the  motrin or the tylenol. Every 8 hours as needed. Over the counter Medication to take: Ibuprofen (Motrin) 600 mg:  Take this every 6 hours.  If you have additional pain then take 500 mg of the tylenol.  Only take the Norco after you have tried these two. Miralax or stool softener of choice: Take this according to the bottle if you take the [place] Call your surgeon's office if any of the following occur:  Fever 101 degrees F or greater  Excessive bleeding or fluid from the incision site.  Pain that increases over time without aid from the medications  Redness, warmth, or pus draining from incision sites  Persistent nausea or inability to take in liquids  Severe misshapen area that underwent the operation.  Post Anesthesia Home Care Instructions  Activity: Get plenty of rest for the remainder of the day. A responsible individual must stay with you for 24 hours following the procedure.  For the next 24 hours, DO NOT: -Drive a car -Paediatric nurse -Drink alcoholic beverages -Take any medication unless instructed by your physician -Make any legal decisions or sign important papers.  Meals: Start with liquid foods such as gelatin or soup. Progress to regular foods as tolerated. Avoid greasy, spicy, heavy foods. If nausea and/or vomiting occur, drink only clear liquids until the nausea and/or vomiting subsides. Call your physician if vomiting continues.  Special Instructions/Symptoms: Your throat may feel dry or sore from the anesthesia or the breathing tube placed in your throat during surgery. If this causes discomfort, gargle with warm salt water. The discomfort should disappear within 24 hours.  If you had a scopolamine patch placed behind your ear for the management of post- operative nausea and/or vomiting:  1. The medication in the patch is effective for 72 hours, after which it should be removed.  Wrap patch in a tissue and discard in the trash. Wash hands thoroughly  with soap and water. 2. You may remove the patch earlier than 72 hours if you experience unpleasant side effects which may include dry mouth, dizziness or visual disturbances. 3. Avoid touching the patch. Wash your hands with soap and water after contact with the patch.

## 2021-03-09 NOTE — Op Note (Signed)
Op report Unilateral Breast Exchange   DATE OF OPERATION:  03/09/2021  LOCATION: [place]  SURGICAL DIVISION: Plastic Surgery  PREOPERATIVE DIAGNOSES:  1. History of right breast cancer.  2. Acquired absence of right breast.  3. Left breast asymmetry.  POSTOPERATIVE DIAGNOSES:  1. History of right breast cancer.  2. Acquired absence of right breast.  3. Left breast asymmetry.  PROCEDURE:  1. Exchange of right tissue expander for implant. 2. Right capsulotomies for implant respositioning. 3. Left breast mastopexy for symmetry.  SURGEON: Kennieth Plotts Sanger Jaedin Regina, DO  ASSISTANT: Roetta Sessions, PA  ANESTHESIA:  General.   COMPLICATIONS: None.   IMPLANTS: Right: [place] Smooth Round High Profile Gel 275cc. Ref #[id number].  Serial Number [id number]  INDICATIONS FOR PROCEDURE:  The patient, Beth Wells, is a 45 y.o. female born on December 26, 1975, is here for further treatment after a mastectomy and placement of a tissue expander. She now presents for exchange of her expander for an implant.  She requires capsulotomies to better position the implant. She would also like a mastopexy/reduction on the left side for symmetry.  MRN: [id number]  CONSENT:  Informed consent was obtained directly from the patient. Risks, benefits and alternatives were fully discussed. Specific risks including but not limited to bleeding, infection, hematoma, seroma, scarring, pain, implant infection, implant extrusion, capsular contracture, asymmetry, wound healing problems, and need for further surgery were all discussed. The patient did have an ample opportunity to have her questions answered to her satisfaction.   DESCRIPTION OF PROCEDURE:  The patient was taken to the operating room. SCDs were placed and IV antibiotics were given. The patient's chest was prepped and draped in a sterile fashion. A time out was performed and the implants to be used were identified.    Right:   Local with epinephrine was used to infiltrate the area. The old mastectomy scar was opened and superior mastectomy and inferior mastectomy flaps were raised over the ADM at the inframammary fold. The ADM was split to expose the tissue expander which was removed. Inspection of the pocket showed a normal healthy capsule and good integration of the biologic matrix. Circumferential capsulotomies were performed to allow for breast pocket expansion.  Measurements were made to confirm adequate pocket size for the implant dimensions.  Hemostasis was ensured with electrocautery.  The pocket was irrigated with antibiotic solution.  New gloves were placed.  The implant was placed in the pocket and oriented appropriately. The 300 cc implant was too large so the smaller one was selected. The capsule was closed with a 3-0 PDS suture. The remaining skin was closed with 3-0 Monocryl deep dermal and 4-0 Monocryl subcuticular stitches.  Dermabond was applied.  A breast binder and ABD was applied.  The patient was allowed to wake from anesthesia and taken to the recovery room in satisfactory condition.   Left: Preoperative markings were confirmed.  Incision lines were injected with local with epinephrine.  After waiting for vasoconstriction, the marked lines were incised.  A Wise-pattern mastopexy was performed by de-epithelializing the periareolar area and the vertical limb.  The bovie was used to loosen the soft tissue from the lateral and medial aspect the limb.  A small portion of inferior fat was excised.  Care was taken to not undermine the breast pedicle. Hemostasis was achieved.  The nipple was raised into position and the soft tissue was closed with 3-0 Monocryl followed by 4-0 Monocryl.  The patient was sat upright and size and shape symmetry  was confirmed.  The vertical limb deep tissue was approximated with 3-0 Monocryl sutures and the skin was closed with deep dermal and subcuticular 4-0 Monocryl sutures.  Dermabond was  applied.  A breast binder and ABDs were placed.  The nipple and skin flaps had good capillary refill at the end of the procedure.  The patient tolerated the procedure well. The patient was allowed to wake from anesthesia and taken to the recovery room in satisfactory condition.  The advanced practice practitioner (APP) assisted throughout the case.  The APP was essential in retraction and counter traction when needed to make the case progress smoothly.  This retraction and assistance made it possible to see the tissue plans for the procedure.  The assistance was needed for blood control, tissue re-approximation and assisted with closure of the incision site.

## 2021-03-09 NOTE — Interval H&P Note (Signed)
History and Physical Interval Note:  03/09/2021 7:38 AM  Beth Wells  has presented today for surgery, with the diagnosis of Carcinoma Of Upper-outer Quadrant Of Right Breast In Female, Estrogen Receptor Negative.  The various methods of treatment have been discussed with the patient and family. After consideration of risks, benefits and other options for treatment, the patient has consented to  Procedure(s) with comments: Removal of right breast expander with placement of implant (Right) - 2.5 hours MASTOPEXY LEFT BREAST (Left) as a surgical intervention.  The patient's history has been reviewed, patient examined, no change in status, stable for surgery.  I have reviewed the patient's chart and labs.  Questions were answered to the patient's satisfaction.     Loel Lofty Emigdio Wildeman

## 2021-03-09 NOTE — Anesthesia Preprocedure Evaluation (Signed)
Anesthesia Evaluation  Patient identified by MRN, date of birth, ID band Patient awake    Reviewed: Allergy & Precautions, NPO status , Patient's Chart, lab work & pertinent test results  History of Anesthesia Complications (+) PONV and history of anesthetic complications  Airway Mallampati: I  TM Distance: >3 FB Neck ROM: Full    Dental  (+) Dental Advisory Given, Teeth Intact   Pulmonary    breath sounds clear to auscultation       Cardiovascular hypertension, Pt. on medications (-) angina(-) Past MI and (-) CHF  Rhythm:Regular     Neuro/Psych neg Seizures  Neuromuscular disease negative psych ROS   GI/Hepatic negative GI ROS, Neg liver ROS,   Endo/Other  negative endocrine ROS  Renal/GU negative Renal ROS     Musculoskeletal negative musculoskeletal ROS (+)   Abdominal   Peds  Hematology negative hematology ROS (+)   Anesthesia Other Findings   Reproductive/Obstetrics                             Anesthesia Physical Anesthesia Plan  ASA: 2  Anesthesia Plan: General   Post-op Pain Management:    Induction: Intravenous  PONV Risk Score and Plan: 4 or greater and Ondansetron, Dexamethasone, Propofol infusion and Treatment may vary due to age or medical condition  Airway Management Planned: Oral ETT  Additional Equipment: None  Intra-op Plan:   Post-operative Plan: Extubation in OR  Informed Consent: I have reviewed the patients History and Physical, chart, labs and discussed the procedure including the risks, benefits and alternatives for the proposed anesthesia with the patient or authorized representative who has indicated his/her understanding and acceptance.     Dental advisory given  Plan Discussed with: CRNA and Surgeon  Anesthesia Plan Comments:         Anesthesia Quick Evaluation

## 2021-03-09 NOTE — Anesthesia Procedure Notes (Signed)
Procedure Name: LMA Insertion Date/Time: 03/09/2021 8:42 AM Performed by: Willa Frater, CRNA Pre-anesthesia Checklist: Patient identified, Emergency Drugs available, Suction available and Patient being monitored Patient Re-evaluated:Patient Re-evaluated prior to induction Oxygen Delivery Method: Circle system utilized Preoxygenation: Pre-oxygenation with 100% oxygen Induction Type: IV induction Ventilation: Mask ventilation without difficulty LMA: LMA inserted LMA Size: 3.0 Number of attempts: 1 Airway Equipment and Method: Bite block Placement Confirmation: positive ETCO2 Tube secured with: Tape Dental Injury: Teeth and Oropharynx as per pre-operative assessment

## 2021-03-09 NOTE — Transfer of Care (Signed)
Immediate Anesthesia Transfer of Care Note  Patient: Beth Wells  Procedure(s) Performed: Removal of right breast expander with placement of implant (Right: Breast) MASTOPEXY LEFT BREAST (Left)  Patient Location: PACU  Anesthesia Type:General  Level of Consciousness: awake, alert , oriented, drowsy and patient cooperative  Airway & Oxygen Therapy: Patient Spontanous Breathing and Patient connected to face mask oxygen  Post-op Assessment: Report given to RN and Post -op Vital signs reviewed and stable  Post vital signs: Reviewed and stable  Last Vitals:  Vitals Value Taken Time  BP 115/73 03/09/21 1008  Temp    Pulse 84 03/09/21 1009  Resp 12 03/09/21 1009  SpO2 98 % 03/09/21 1009  Vitals shown include unvalidated device data.  Last Pain:  Vitals:   03/09/21 0717  TempSrc: Oral  PainSc: 3       Patients Stated Pain Goal: 3 (90/12/22 4114)  Complications: No notable events documented.

## 2021-03-10 ENCOUNTER — Encounter (HOSPITAL_BASED_OUTPATIENT_CLINIC_OR_DEPARTMENT_OTHER): Payer: Self-pay | Admitting: Plastic Surgery

## 2021-03-10 LAB — SURGICAL PATHOLOGY

## 2021-03-13 NOTE — Anesthesia Postprocedure Evaluation (Signed)
Anesthesia Post Note  Patient: Beth Wells  Procedure(s) Performed: Removal of right breast expander with placement of implant (Right: Breast) MASTOPEXY LEFT BREAST (Left: Breast)     Patient location during evaluation: PACU Anesthesia Type: General Level of consciousness: awake and alert Pain management: pain level controlled Vital Signs Assessment: post-procedure vital signs reviewed and stable Respiratory status: spontaneous breathing, nonlabored ventilation, respiratory function stable and patient connected to nasal cannula oxygen Cardiovascular status: blood pressure returned to baseline and stable Postop Assessment: no apparent nausea or vomiting Anesthetic complications: no   No notable events documented.  Last Vitals:  Vitals:   03/09/21 1100 03/09/21 1133  BP: 114/81 (!) 151/81  Pulse: 87 81  Resp: 11 14  Temp:  36.5 C  SpO2: 94% 96%    Last Pain:  Vitals:   03/09/21 1120  TempSrc:   PainSc: 0-No pain                 Candia Kingsbury

## 2021-03-17 ENCOUNTER — Other Ambulatory Visit: Payer: Self-pay

## 2021-03-17 ENCOUNTER — Ambulatory Visit (INDEPENDENT_AMBULATORY_CARE_PROVIDER_SITE_OTHER): Payer: BC Managed Care – PPO | Admitting: Surgical

## 2021-03-17 DIAGNOSIS — C50411 Malignant neoplasm of upper-outer quadrant of right female breast: Secondary | ICD-10-CM

## 2021-03-17 DIAGNOSIS — Z171 Estrogen receptor negative status [ER-]: Secondary | ICD-10-CM

## 2021-03-17 DIAGNOSIS — Z9011 Acquired absence of right breast and nipple: Secondary | ICD-10-CM

## 2021-03-17 NOTE — Progress Notes (Signed)
Patient is a 45 year old female here for follow-up after removal of right breast tissue expander and placement of right breast implant and left breast mastopexy with Dr. Marla Roe on 03/09/2021.  She is 1 week postop.  She had a Mentor smooth round high-profile gel 275 cc implant placed in the right breast.  Reference [phone].  Serial [id number]  She is doing well.  She does have some irritation from the honeycomb dressings.  She thought that the left breast would be much higher than it currently has.  She is not having any infectious symptoms.  She is moving her bowels normally.  Chaperone present on exam On exam bilateral breast incisions are intact.  Steri-Strips are in place.  No erythema noted.  She has some irritation from the honeycomb dressing on the right inferior breast.  No drainage is noted.  No cellulitic changes are noted.  No subcutaneous fluid collections are noted.  She can transition to wearing a sports bra if she would like.  She should wear this 24/7.  Continue to avoid strenuous activities.  She has an appointment scheduled for 2 weeks.  No sign of infection, seroma, hematoma

## 2021-03-18 ENCOUNTER — Encounter: Payer: BC Managed Care – PPO | Admitting: Surgical

## 2021-03-18 ENCOUNTER — Inpatient Hospital Stay: Payer: BC Managed Care – PPO | Attending: Internal Medicine

## 2021-03-18 DIAGNOSIS — Z452 Encounter for adjustment and management of vascular access device: Secondary | ICD-10-CM | POA: Insufficient documentation

## 2021-03-18 DIAGNOSIS — Z95828 Presence of other vascular implants and grafts: Secondary | ICD-10-CM

## 2021-03-18 DIAGNOSIS — C50411 Malignant neoplasm of upper-outer quadrant of right female breast: Secondary | ICD-10-CM | POA: Insufficient documentation

## 2021-03-18 DIAGNOSIS — Z170 Estrogen receptor positive status [ER+]: Secondary | ICD-10-CM | POA: Insufficient documentation

## 2021-03-18 MED ORDER — SODIUM CHLORIDE 0.9% FLUSH
10.0000 mL | INTRAVENOUS | Status: DC | PRN
  Administered 2021-03-18: 10 mL via INTRAVENOUS
  Filled 2021-03-18: qty 10

## 2021-03-18 MED ORDER — HEPARIN SOD (PORK) LOCK FLUSH 100 UNIT/ML IV SOLN
500.0000 [IU] | Freq: Once | INTRAVENOUS | Status: AC
  Administered 2021-03-18: 500 [IU] via INTRAVENOUS
  Filled 2021-03-18: qty 5

## 2021-03-22 ENCOUNTER — Other Ambulatory Visit: Payer: Self-pay

## 2021-03-22 ENCOUNTER — Ambulatory Visit
Payer: BC Managed Care – PPO | Attending: Student in an Organized Health Care Education/Training Program | Admitting: Student in an Organized Health Care Education/Training Program

## 2021-03-22 ENCOUNTER — Encounter: Payer: Self-pay | Admitting: Student in an Organized Health Care Education/Training Program

## 2021-03-22 VITALS — BP 118/84 | HR 100 | Temp 97.0°F | Resp 16 | Ht 62.0 in | Wt 100.0 lb

## 2021-03-22 DIAGNOSIS — R202 Paresthesia of skin: Secondary | ICD-10-CM

## 2021-03-22 DIAGNOSIS — G603 Idiopathic progressive neuropathy: Secondary | ICD-10-CM

## 2021-03-22 DIAGNOSIS — G629 Polyneuropathy, unspecified: Secondary | ICD-10-CM

## 2021-03-22 DIAGNOSIS — G608 Other hereditary and idiopathic neuropathies: Secondary | ICD-10-CM | POA: Diagnosis present

## 2021-03-22 DIAGNOSIS — C50411 Malignant neoplasm of upper-outer quadrant of right female breast: Secondary | ICD-10-CM | POA: Diagnosis present

## 2021-03-22 DIAGNOSIS — G894 Chronic pain syndrome: Secondary | ICD-10-CM

## 2021-03-22 DIAGNOSIS — Z171 Estrogen receptor negative status [ER-]: Secondary | ICD-10-CM

## 2021-03-22 MED ORDER — NONFORMULARY OR COMPOUNDED ITEM: 2 refills | Status: AC

## 2021-03-22 MED ORDER — PREGABALIN 100 MG PO CAPS: 100.0000 mg | ORAL_CAPSULE | Freq: Two times a day (BID) | ORAL | 1 refills | Status: DC

## 2021-03-22 MED ORDER — TRAMADOL HCL ER 200 MG PO TB24: 200.0000 mg | ORAL_TABLET | Freq: Every day | ORAL | 1 refills | Status: DC

## 2021-03-22 MED ORDER — TRAMADOL HCL 50 MG PO TABS: 50.0000 mg | ORAL_TABLET | Freq: Two times a day (BID) | ORAL | 1 refills | Status: DC | PRN

## 2021-03-22 NOTE — Progress Notes (Signed)
PROVIDER NOTE: Information contained herein reflects review and annotations entered in association with encounter. Interpretation of such information and data should be left to medically-trained personnel. Information provided to patient can be located elsewhere in the medical record under "Patient Instructions". Document created using STT-dictation technology, any transcriptional errors that may result from process are unintentional.    Patient: Beth Wells  Service Category: E/M  Provider: Gillis Santa, MD  DOB: 10-05-1975  DOS: 03/22/2021  Specialty: Interventional Pain Management  MRN: [id number]  Setting: Ambulatory outpatient  PCP: Elza Rafter, MD  Type: Established Patient    Referring Provider: Elza Rafter, *  Location: Office  Delivery: Face-to-face     HPI  [name], a 45 y.o. year old female, is here today because of her Chronic pain syndrome [G89.4]. [name] primary complain today is Peripheral Neuropathy Last encounter: My last encounter with her was on 02/16/2021. Pertinent problems: [name] has Small fiber neuropathy; Paresthesias; and Chronic pain syndrome on their pertinent problem list. Pain Assessment: Severity of Chronic pain is reported as a 7 /10. Location: Other (Comment) (generalized)  /pain is everywhere. Onset: More than a month ago. Quality: Burning. Timing: Constant. Modifying factor(s): TENS, heat helps at night, meds. Vitals:  height is '5\' 2"'  (1.575 m) and weight is 100 lb (45.4 kg). Her temporal temperature is 97 F (36.1 C) (abnormal). Her blood pressure is 118/84 and her pulse is 100. Her respiration is 16 and oxygen saturation is 100%.   Reason for encounter: medication management.    Since the patient's last infusion with me, she did have a lidocaine infusion which she states was somewhat helpful.  Patient is interested in repeating Lidocaine infusion She also had removal of her right breast expander on 03/10/2021. Is finding some  benefit with Tramadol ER 100 but still has to take to 2 immediate release tramadol's, 50 mg each in the afternoon most evenings.  We discussed increasing her tramadol ER to 200 mg daily so that she can utilize less as needed tramadol. She is not finding any benefit with Cymbalta at 20 mg wants to discontinue this medication. I instructed her to continue Lyrica as prescribed We will also obtain urine toxicology screen today which is customary for new patients   Pharmacotherapy Assessment   Analgesic: tramadol 200 mg ER, continue tramadol IR 50 mg twice daily for breakthrough pain    Monitoring: [place] PMP: PDMP reviewed during this encounter.       Pharmacotherapy: No side-effects or adverse reactions reported. Compliance: No problems identified. Effectiveness: Clinically acceptable.  Rise Patience, RN  03/22/2021  2:40 PM  Sign when Signing Visit Nursing Pain Medication Assessment:  Safety precautions to be maintained throughout the outpatient stay will include: orient to surroundings, keep bed in low position, maintain call bell within reach at all times, provide assistance with transfer out of bed and ambulation.  Medication Inspection Compliance: Pill count conducted under aseptic conditions, in front of the patient. Neither the pills nor the bottle was removed from the patient's sight at any time. Once count was completed pills were immediately returned to the patient in their original bottle.  Medication #1:  Tramadol 100 mg Pill/Patch Count:  15 of 30 pills remain Pill/Patch Appearance: Markings consistent with prescribed medication Bottle Appearance: Standard pharmacy container. Clearly labeled. Filled Date: 06 / 13 / 2022 Last Medication intake:  Today  Medication #2:  Tramadol 50 mg Pill/Patch Count:  13 of 60 pills remain Pill/Patch Appearance: Markings consistent with  prescribed medication Bottle Appearance: Standard pharmacy container. Clearly labeled. Filled Date: 05 / 11 /   2022 Last Medication intake:  Yesterday      UDS: No results found for: SUMMARY   ROS  Constitutional: Denies any fever or chills Gastrointestinal: No reported hemesis, hematochezia, vomiting, or acute GI distress Musculoskeletal: Denies any acute onset joint swelling, redness, loss of ROM, or weakness Neurological:  Lower extremity neuropathic pain  Medication Review  Alpha-Lipoic Acid, NONFORMULARY OR COMPOUNDED ITEM, Probiotic Product, amphetamine-dextroamphetamine, lisinopril, pregabalin, traMADol, and trolamine salicylate  History Review  Allergy: [name] has No Known Allergies. Drug: [name]  reports no history of drug use. Alcohol:  reports previous alcohol use. Tobacco:  reports that she has never smoked. She has never used smokeless tobacco. Social: [name]  reports that she has never smoked. She has never used smokeless tobacco. She reports previous alcohol use. She reports that she does not use drugs. Medical:  has a past medical history of Breast cancer ([place]) (08/05/2020), Carcinoma of upper-outer quadrant of right breast in female, estrogen receptor negative ([place]) (97/41/6384), Complication of anesthesia, H/O foot surgery (08/2018), History of kidney stones, Hypertension, and PONV (postoperative nausea and vomiting). Surgical: [name]  has a past surgical history that includes Kidney stone surgery; Cervical cone biopsy; Dilation and evacuation (N/A, 03/12/2018); laparoscopy (N/A, 06/07/2019); Laparoscopic ovarian cystectomy (Right, 06/07/2019); Breast biopsy (Right, 07/06/2020); Bunionectomy (Right); Total mastectomy (Right, 08/05/2020); Portacath placement (Left, 08/05/2020); Breast reconstruction with placement of tissue expander and flex hd (acellular hydrated dermis) (Right, 08/05/2020); Removal of tissue expander and placement of implant (Right, 03/09/2021); and Mastopexy (Left, 03/09/2021). Family: family history includes Cancer in an other family member.  Laboratory  Chemistry Profile   Renal Lab Results  Component Value Date   BUN 27 (H) 01/24/2021   CREATININE 0.79 01/24/2021   GFRAA >60 06/07/2019   GFRNONAA >60 01/24/2021     Hepatic Lab Results  Component Value Date   AST 17 01/24/2021   ALT 26 01/24/2021   ALBUMIN 4.0 01/24/2021   ALKPHOS 98 01/24/2021     Electrolytes Lab Results  Component Value Date   NA 138 01/24/2021   K 4.0 01/24/2021   CL 102 01/24/2021   CALCIUM 9.5 01/24/2021     Bone No results found for: VD25OH, TX646OE3OZY, YQ8250IB7, CW8889VQ9, 25OHVITD1, 25OHVITD2, 25OHVITD3, TESTOFREE, TESTOSTERONE   Inflammation (CRP: Acute Phase) (ESR: Chronic Phase) Lab Results  Component Value Date   LATICACIDVEN 1.4 10/18/2020       Note: Above Lab results reviewed.  Recent Imaging Review  DG Chest 2 View CLINICAL DATA:  Breast cancer patient with fever today. Chills and weakness. Recent mastectomy.  EXAM: CHEST - 2 VIEW  COMPARISON:  08/05/2020  FINDINGS: Hyperinflation of the lungs. Power port type left central venous catheter with tip over the low SVC region. No pneumothorax. Heart size and pulmonary vascularity are normal. Postoperative right mastectomy with breast reconstruction. Lungs are clear. No airspace disease, consolidation, or edema. No pleural effusions. No pneumothorax. Mediastinal contours appear intact.  IMPRESSION: No active cardiopulmonary disease.  Electronically Signed   By: Lucienne Capers M.D.   On: 10/18/2020 23:25 Note: Reviewed        Physical Exam  General appearance: Well nourished, well developed, and well hydrated. In no apparent acute distress Mental status: Alert, oriented x 3 (person, place, & time)       Respiratory: No evidence of acute respiratory distress Eyes: PERLA Vitals: BP 118/84 (BP Location: Left Arm,  Patient Position: Sitting, Cuff Size: Normal)   Pulse 100   Temp (!) 97 F (36.1 C) (Temporal)   Resp 16   Ht '5\' 2"'  (1.575 m)   Wt 100 lb (45.4 kg)    SpO2 100%   BMI 18.29 kg/m  BMI: Estimated body mass index is 18.29 kg/m as calculated from the following:   Height as of this encounter: '5\' 2"'  (1.575 m).   Weight as of this encounter: 100 lb (45.4 kg). Ideal: Female patients must weigh at least 45.5 kg to calculate ideal body weight  Neurogenic pain pattern of lower extremity 5 out of 5 strength bilateral lower extremity: Plantar flexion, dorsiflexion, knee flexion, knee extension.   Assessment   Diagnosis  1. Chronic pain syndrome   2. Small fiber neuropathy   3. Idiopathic progressive neuropathy   4. Carcinoma of upper-outer quadrant of right breast in female, estrogen receptor negative ([place])   5. Idiopathic small fiber sensory neuropathy   6. Paresthesias        Plan of Care  [name] has a current medication list which includes the following long-term medication(s): amphetamine-dextroamphetamine, lisinopril, and pregabalin.  Pharmacotherapy (Medications Ordered): Meds ordered this encounter  Medications   pregabalin (LYRICA) 100 MG capsule    Sig: Take 1 capsule (100 mg total) by mouth 2 (two) times daily.    Dispense:  180 capsule    Refill:  1    Fill one day early if pharmacy is closed on scheduled refill date. May substitute for generic if available.   traMADol (ULTRAM) 50 MG tablet    Sig: Take 1 tablet (50 mg total) by mouth every 12 (twelve) hours as needed for severe pain.    Dispense:  60 tablet    Refill:  1    To last for 30 days from fill date   traMADol (ULTRAM-ER) 200 MG 24 hr tablet    Sig: Take 1 tablet (200 mg total) by mouth daily.    Dispense:  30 tablet    Refill:  1    For chronic pain syndrome   NONFORMULARY OR COMPOUNDED ITEM    Sig: Sig: Apply 1-2 gm(s) (2-4 pumps) to affected area, 3-4 times/day. (1 pump = 0.5 gm)    Dispense:  1 each    Refill:  2    Compounded cream: 6% Gabapentin, 2.5% Lidocaine, 0.5% Meloxicam, 5% Methocarbamol, 2.5% Prilocaine. Dispense: 120 gm Pump  Bottle. (Dispenser: 1 pump = 0.5 gm.)    Orders:  Orders Placed This Encounter  Procedures   LIDOCAINE INFUSION    Standing Status:   Future    Standing Expiration Date:   04/21/2021    Scheduling Instructions:     Laterality: Does not apply     Level(s): Peripheral vascular access     Sedation: With IV Sedation     Scheduling Timeframe: As soon as pre-approved     NOTE: Continuous cardiovascular monitoring required (ECG, NIBPM, SpO2)    Order Specific Question:   Where will this procedure be performed?    Answer:   [facility]   ToxASSURE Select 13 (MW), Urine    Volume: 30 ml(s). Minimum 3 ml of urine is needed. Document temperature of fresh sample. Indications: Long term (current) use of opiate analgesic [phone])    Order Specific Question:   Release to patient    Answer:   Immediate    Follow-up plan:   Return in about 8 weeks (around 05/17/2021) for Medication  Management, in person.   Recent Visits Date Type Provider Dept  02/16/21 Procedure visit Gillis Santa, MD [facility]  02/02/21 Office Visit Gillis Santa, MD [facility]  01/06/21 Telemedicine Gillis Santa, MD [facility]  Showing recent visits within past 90 days and meeting all other requirements Today's Visits Date Type Provider Dept  03/22/21 Office Visit Gillis Santa, MD [facility]  Showing today's visits and meeting all other requirements Future Appointments Date Type Provider Dept  03/30/21 Appointment Gillis Santa, MD [facility]  05/12/21 Appointment Gillis Santa, MD [facility]  Showing future appointments within next 90 days and meeting all other requirements I discussed the assessment and treatment plan with the patient. The patient was provided an opportunity to ask questions and all were answered. The patient agreed with the plan and demonstrated an understanding of the instructions.  Patient advised to call back or seek an  in-person evaluation if the symptoms or condition worsens.  Duration of encounter: 30 minutes.  Note by: Gillis Santa, MD Date: 03/22/2021; Time: 3:42 PM

## 2021-03-22 NOTE — Progress Notes (Signed)
Nursing Pain Medication Assessment:  Safety precautions to be maintained throughout the outpatient stay will include: orient to surroundings, keep bed in low position, maintain call bell within reach at all times, provide assistance with transfer out of bed and ambulation.  Medication Inspection Compliance: Pill count conducted under aseptic conditions, in front of the patient. Neither the pills nor the bottle was removed from the patient's sight at any time. Once count was completed pills were immediately returned to the patient in their original bottle.  Medication #1:  Tramadol 100 mg Pill/Patch Count:  15 of 30 pills remain Pill/Patch Appearance: Markings consistent with prescribed medication Bottle Appearance: Standard pharmacy container. Clearly labeled. Filled Date: 06 / 13 / 2022 Last Medication intake:  Today  Medication #2:  Tramadol 50 mg Pill/Patch Count:  13 of 60 pills remain Pill/Patch Appearance: Markings consistent with prescribed medication Bottle Appearance: Standard pharmacy container. Clearly labeled. Filled Date: 05 / 11 /  2022 Last Medication intake:  Yesterday

## 2021-03-24 ENCOUNTER — Telehealth: Payer: Self-pay

## 2021-03-24 NOTE — Telephone Encounter (Signed)
Research RN called patient this am to review the [facility] [place]-[facility]102 protocol. This nurse reviewed the protocol consent with the patient briefly via telephone. The patient states she is interested in learning more about the study and wants a copy of the consent / hipaa. The patient has requested a copy be sent to her email address that she provided to research. She states she is going out of town for the weekend but will return March 29, 2021 and call the research department to let us know if she is interested in participating at that time.  Research nurse thanked the patient for her willingness to review the protocol for consideration and participation. Jeral Fruit, RN 03/24/21 11:12 AM

## 2021-03-25 ENCOUNTER — Encounter: Payer: Self-pay | Admitting: Internal Medicine

## 2021-03-30 ENCOUNTER — Ambulatory Visit
Payer: BC Managed Care – PPO | Attending: Student in an Organized Health Care Education/Training Program | Admitting: Student in an Organized Health Care Education/Training Program

## 2021-03-30 ENCOUNTER — Encounter: Payer: Self-pay | Admitting: Internal Medicine

## 2021-03-30 ENCOUNTER — Encounter: Payer: Self-pay | Admitting: Student in an Organized Health Care Education/Training Program

## 2021-03-30 ENCOUNTER — Other Ambulatory Visit: Payer: Self-pay

## 2021-03-30 VITALS — BP 145/85 | HR 89 | Temp 96.8°F | Resp 16 | Ht 62.0 in | Wt 100.0 lb

## 2021-03-30 DIAGNOSIS — G894 Chronic pain syndrome: Secondary | ICD-10-CM

## 2021-03-30 DIAGNOSIS — G629 Polyneuropathy, unspecified: Secondary | ICD-10-CM

## 2021-03-30 DIAGNOSIS — G608 Other hereditary and idiopathic neuropathies: Secondary | ICD-10-CM | POA: Diagnosis not present

## 2021-03-30 DIAGNOSIS — G603 Idiopathic progressive neuropathy: Secondary | ICD-10-CM

## 2021-03-30 LAB — TOXASSURE SELECT 13 (MW), URINE

## 2021-03-30 MED ORDER — LIDOCAINE IN D5W 4-5 MG/ML-% IV SOLN
INTRAVENOUS | Status: AC
  Filled 2021-03-30: qty 500

## 2021-03-30 MED ORDER — LIDOCAINE IN D5W 4-5 MG/ML-% IV SOLN
4.0000 mg/min | INTRAVENOUS | Status: AC
  Administered 2021-03-30: 3 mg/min via INTRAVENOUS

## 2021-03-30 NOTE — Patient Instructions (Signed)

## 2021-03-30 NOTE — Progress Notes (Signed)
PROVIDER NOTE: Information contained herein reflects review and annotations entered in association with encounter. Interpretation of such information and data should be left to medically-trained personnel. Information provided to patient can be located elsewhere in the medical record under "Patient Instructions". Document created using STT-dictation technology, any transcriptional errors that may result from process are unintentional.    Patient: Beth Wells  Service Category: Procedure  Provider: Gillis Santa, MD  DOB: 03/08/1976  DOS: 03/30/2021  Location: [facility]  MRN: [id number]  Setting: Ambulatory - outpatient  Referring Provider: Elza Rafter, *  Type: Established Patient  Specialty: Interventional Pain Management  PCP: Elza Rafter, MD   Primary Reason for Visit: Interventional Pain Management Treatment. CC: neuropathic pain  Procedure:           Type: Palliative Intravenous lidocaine infusion #2 Region: Systemic Level: Upper Extremity IV access Laterality: Please see nurses note.    Indications: 1. Chronic pain syndrome   2. Small fiber neuropathy   3. Idiopathic progressive neuropathy   4. Idiopathic small fiber sensory neuropathy     Pain Score: Pre-procedure: 3 /10 Post-procedure: 1  (leg pain has improved)/10   Pre-op H&P Assessment:  Beth Wells is a 45 y.o. (year old), female patient, seen today for interventional treatment. She  has a past surgical history that includes Kidney stone surgery; Cervical cone biopsy; Dilation and evacuation (N/A, 03/12/2018); laparoscopy (N/A, 06/07/2019); Laparoscopic ovarian cystectomy (Right, 06/07/2019); Breast biopsy (Right, 07/06/2020); Bunionectomy (Right); Total mastectomy (Right, 08/05/2020); Portacath placement (Left, 08/05/2020); Breast reconstruction with placement of tissue expander and flex hd (acellular hydrated dermis) (Right, 08/05/2020); Removal of tissue expander and placement of implant  (Right, 03/09/2021); Mastopexy (Left, 03/09/2021); and Reconstruction breast immediate / delayed w/ tissue expander (Right, 03/09/2021). Beth Wells has a current medication list which includes the following prescription(s): alpha-lipoic acid, amphetamine-dextroamphetamine, lisinopril, NONFORMULARY OR COMPOUNDED ITEM, pregabalin, probiotic product, tramadol, tramadol, and trolamine salicylate. Her primarily concern today is the Peripheral Neuropathy  Initial Vital Signs:  Pulse/HCG Rate: 89ECG Heart Rate: 85 Temp: (!) 96.8 F (36 C) Resp: 18 BP: 114/80 SpO2: 100 %  BMI: Estimated body mass index is 18.29 kg/m as calculated from the following:   Height as of this encounter: 5\' 2"  (1.575 m).   Weight as of this encounter: 100 lb (45.4 kg).  Risk Assessment: Allergies: Reviewed. She has No Known Allergies.  Allergy Precautions: None required Coagulopathies: Reviewed. None identified.  Blood-thinner therapy: None at this time Active Infection(s): Reviewed. None identified. Beth Wells is afebrile  Site Confirmation: Beth Wells was asked to confirm the procedure and laterality before marking the site Procedure checklist: Completed Consent: Before the procedure and under the influence of no sedative(s), amnesic(s), or anxiolytics, the patient was informed of the treatment options, risks and possible complications. To fulfill our ethical and legal obligations, as recommended by the American Medical Association's Code of Ethics, I have informed the patient of my clinical impression; the nature and purpose of the treatment or procedure; the risks, benefits, and possible complications of the intervention; the alternatives, including doing nothing; the risk(s) and benefit(s) of the alternative treatment(s) or procedure(s); and the risk(s) and benefit(s) of doing nothing. The patient was provided information about the general risks and possible complications associated with any invasive procedure. These may  include, but are not limited to: failure to achieve desired goals; pain; worsening of initial condition; infections; bleeding; organ or nerve damage; allergic reactions; and death. In addition, the patient was informed of those risks  and complications associated to this procedure, such as failure to decrease pain; infection; bleeding; phlebitis; vascular extravasation; tissue necrosis; tenderness at IV access site; skin or nerve damage with subsequent damage to sensory, motor, and/or autonomic systems; worsening of the pain; persistent pain, numbness, and/or weakness of one or several areas of the body; cardiac dysrhythmias; seizure; stroke; and/or death. Furthermore, the patient was informed of those risks and complications associated with the medications used during the procedure. These include, but are not limited to: allergic reactions (i.e.: anaphylactic or anaphylactoid reactions); cardiac conduction blockade; poisoning; toxicity; CNS depression; cardiovascular depression and collapse; muscle twitching; tonic-clonic seizures; convulsions; loss of consciousness; coma; respiratory depression; arrest; and/or death. Finally, the patient was informed that Medicine is not an exact science; therefore, there is also the possibility of unforeseen or unpredictable risks and/or possible complications that may result in a catastrophic outcome. The patient indicated having understood very clearly. We have given the patient no guarantees and we have made no promises. Enough time was given to the patient to ask questions, all of which were answered to the patient's satisfaction. Beth Wells has indicated that she wanted to continue with the procedure. Attestation: I, the ordering provider, attest that I have discussed with the patient the benefits, risks, side-effects, alternatives, likelihood of achieving goals, and potential problems during recovery for the procedure that I have provided informed consent. Date  Time:  03/30/2021  8:00 AM  Pre-Procedure Preparation:  Monitoring: As per clinic protocol. Respiration, ETCO2, SpO2, BP, heart rate and rhythm monitor placed and checked for adequate function Safety Precautions: Patient was assessed for positional comfort and pressure points before starting the procedure. Time-out: I initiated and conducted the "Time-out" before starting the procedure, as per protocol. The patient was asked to participate by confirming the accuracy of the "Time Out" information. Verification of the correct person, site, and procedure were performed and confirmed by me, the nursing Wells, and the patient. "Time-out" conducted as per Joint Commission's Universal Protocol (UP.01.01.01). Time: 0827  Description of Procedure:          Target Area: Intravenous Approach: Intravenous angiocath approach. Area Prepped: Antecubital DuraPrep (Iodine Povacrylex [0.7% available iodine] and Isopropyl Alcohol, 74% w/w) Safety Precautions: Medications properly checked for expiration dates. SDV (single dose vial) medications used.  Dose Calculation: Lidocaine preparation: 2 grams of IV lidocaine in 500 mL of D5W (4 mg/mL) (0.4% Lidocaine) Maximum Lidocaine Dose: 4 mg/kg x 100 lb (45.4 kg) =  181.6 mg.  Calculated infusion rate: 181.6 mg divided by 4 mg/mL = 45.4, will decrease to 45 mL in 60 minutes (45 mL/hr) Weight: 100 lb (45.4 kg) Set rate: 55mL/hr Duration of Infusion: 1 hour  Description of the Procedure: Protocol guidelines were followed. The patient was placed in position. Informed consent was obtained.  She was cautioned to be on the look out for prodromal symptoms of a possible impending seizure, such as: new onset tinnitus; perioral, circumoral, or tongue numbness; or a metallic taste in her mouth, lightheadedness; dizziness; visual or auditory disturbances; disorientation; profound drowsiness; or muscle twitching. An IV was started and the patient's dose calculated as above. The infusion  was carried out with a nurse at her side 100% of the time, monitoring her vitals as per protocol. Pre-procedure sedation was started 5-10 minutes before infusion and midazolam was kept at bedside during the entire procedure, along with CPR equipment, as per protocol.  Vitals:   03/30/21 0917 03/30/21 0922 03/30/21 0928 03/30/21 0935  BP: (!) 155/92 Marland Kitchen)  155/84 (!) 163/90 (!) 145/85  Pulse:      Resp: 16 14 17 16   Temp:      TempSrc:      SpO2: 100% 100% 100% 100%  Weight:      Height:        Start Time: 0827 hrs. End Time:   hrs. Materials: IV infusion pump Medication(s): IV Lidocaine.  Post-operative Assessment:  Post-procedure Vital Signs:  Pulse/HCG Rate: 8987 Temp: (!) 96.8 F (36 C) Resp: 16 BP: (!) 145/85 SpO2: 100 %  EBL: None  Complications: No immediate post-treatment complications observed by team, or reported by patient.  Note: The patient tolerated the entire procedure well. A repeat set of vitals were taken after the procedure and the patient was kept under observation following institutional policy, for this type of procedure. Post-procedural neurological assessment was performed, showing return to baseline, prior to discharge. The patient was provided with post-procedure discharge instructions, including a section on how to identify potential problems. Should any problems arise concerning this procedure, the patient was given instructions to immediately contact us, at any time, without hesitation. In any case, we plan to contact the patient by telephone for a follow-up status report regarding this interventional procedure.  Comments:  No additional relevant information.  Plan of Care   Procedure Orders    No procedure(s) ordered today    Medications ordered for procedure: Meds ordered this encounter  Medications   lidocaine (cardiac) 2000 mg in dextrose 5% 500 mL (4mg /mL) IV infusion    IV Lidocaine 2 grams in 500 mL of Lactated Ringer's(LR). (4 mg/mL) (0.4%  Lidocaine)    Medications administered: We administered lidocaine.  See the medical record for exact dosing rate.  New Prescriptions   No medications on file   Disposition: Discharge home  Discharge Date & Time: 03/30/2021; 0845 hrs.   Physician-requested Follow-up: Return for Keep sch. appt. Recent Visits Date Type Provider Dept  03/22/21 Office Visit Gillis Santa, MD [facility]  02/16/21 Procedure visit Gillis Santa, MD [facility]  02/02/21 Office Visit Gillis Santa, MD [facility]  01/06/21 Telemedicine Gillis Santa, MD [facility]  Showing recent visits within past 90 days and meeting all other requirements Today's Visits Date Type Provider Dept  03/30/21 Procedure visit Gillis Santa, MD [facility]  Showing today's visits and meeting all other requirements Future Appointments Date Type Provider Dept  05/12/21 Appointment Gillis Santa, MD [facility]  Showing future appointments within next 90 days and meeting all other requirements Primary Care Physician: Elza Rafter, MD Location: [facility] Note by: Gillis Santa, MD Date: 03/30/2021; Time: 10:13 AM  Disclaimer:  Medicine is not an exact science. The only guarantee in medicine is that nothing is guaranteed. It is important to note that the decision to proceed with this intervention was based on the information collected from the patient. The Data and conclusions were drawn from the patient's questionnaire, the interview, and the physical examination. Because the information was provided in large part by the patient, it cannot be guaranteed that it has not been purposely or unconsciously manipulated. Every effort has been made to obtain as much relevant data as possible for this evaluation. It is important to note that the conclusions that lead to this procedure are derived in large part from the available data. Always take  into account that the treatment will also be dependent on availability of resources and existing treatment guidelines, considered by  other Pain Management Practitioners as being common knowledge and practice, at the time of the intervention. For Medico-Legal purposes, it is also important to point out that variation in procedural techniques and pharmacological choices are the acceptable norm. The indications, contraindications, technique, and results of the above procedure should only be interpreted and judged by a Board-Certified Interventional Pain Specialist with extensive familiarity and expertise in the same exact procedure and technique.

## 2021-03-30 NOTE — Progress Notes (Signed)
Safety precautions to be maintained throughout the outpatient stay will include: orient to surroundings, keep bed in low position, maintain call bell within reach at all times, provide assistance with transfer out of bed and ambulation.    6629 Patient VSS. Denies any toxicity symptoms. Talking appropriately. JCS  4765 Infusion complete. No complicaions. JCS  [id number] Dr. Holley Raring in to see.  0945 Discharged.

## 2021-03-31 ENCOUNTER — Telehealth: Payer: Self-pay

## 2021-03-31 NOTE — Telephone Encounter (Signed)
Post procedure phone call.  Patient states she is doing well.  

## 2021-04-01 ENCOUNTER — Other Ambulatory Visit: Payer: Self-pay

## 2021-04-01 ENCOUNTER — Ambulatory Visit (INDEPENDENT_AMBULATORY_CARE_PROVIDER_SITE_OTHER): Payer: BC Managed Care – PPO | Admitting: Plastic Surgery

## 2021-04-01 ENCOUNTER — Encounter: Payer: Self-pay | Admitting: Plastic Surgery

## 2021-04-01 DIAGNOSIS — C50411 Malignant neoplasm of upper-outer quadrant of right female breast: Secondary | ICD-10-CM

## 2021-04-01 DIAGNOSIS — Z9011 Acquired absence of right breast and nipple: Secondary | ICD-10-CM

## 2021-04-01 DIAGNOSIS — Z171 Estrogen receptor negative status [ER-]: Secondary | ICD-10-CM

## 2021-04-01 NOTE — Progress Notes (Signed)
Patient is a 45 year old female here for follow-up after breast reconstruction.  She had a right breast expander removal with implant placement and left breast mastopexy.  She looks much better.  There is still a lot of tightness on the right breast.  I think this is can take more time to relax.  The tightness is in the exact distribution of the Flex HD.  No sign of infection.  The incisions are healing well.  She will likely need fat grafting.  I would like to see the patient back in 2 to 3 months for further evaluation.  Patient is in agreement.  Pictures were obtained of the patient and placed in the chart with the patient's or guardian's permission.

## 2021-04-12 ENCOUNTER — Encounter: Payer: Self-pay | Admitting: Student in an Organized Health Care Education/Training Program

## 2021-04-13 ENCOUNTER — Other Ambulatory Visit: Payer: Self-pay

## 2021-04-13 ENCOUNTER — Ambulatory Visit
Payer: BC Managed Care – PPO | Attending: Student in an Organized Health Care Education/Training Program | Admitting: Student in an Organized Health Care Education/Training Program

## 2021-04-13 ENCOUNTER — Encounter: Payer: Self-pay | Admitting: Student in an Organized Health Care Education/Training Program

## 2021-04-13 DIAGNOSIS — G603 Idiopathic progressive neuropathy: Secondary | ICD-10-CM

## 2021-04-13 DIAGNOSIS — G608 Other hereditary and idiopathic neuropathies: Secondary | ICD-10-CM | POA: Diagnosis not present

## 2021-04-13 DIAGNOSIS — G894 Chronic pain syndrome: Secondary | ICD-10-CM

## 2021-04-13 DIAGNOSIS — G629 Polyneuropathy, unspecified: Secondary | ICD-10-CM | POA: Diagnosis not present

## 2021-04-13 NOTE — Progress Notes (Signed)
Patient: Beth Wells  Service Category: E/M  Provider: Gillis Santa, MD  DOB: 02-22-1976  DOS: 04/13/2021  Location: Office  MRN: [id number]  Setting: Ambulatory outpatient  Referring Provider: Elza Rafter, *  Type: Established Patient  Specialty: Interventional Pain Management  PCP: Elza Rafter, MD  Location: Home  Delivery: TeleHealth     Virtual Encounter - Pain Management PROVIDER NOTE: Information contained herein reflects review and annotations entered in association with encounter. Interpretation of such information and data should be left to medically-trained personnel. Information provided to patient can be located elsewhere in the medical record under "Patient Instructions". Document created using STT-dictation technology, any transcriptional errors that may result from process are unintentional.    Contact & Pharmacy Preferred: [phone] Home: [phone] (home) Mobile: [phone] (mobile) E-mail: [email or web address]  [id number] [place] [address]Phone: [phone]: [phone]  Pre-screening  Beth Wells offered "in-person" vs "virtual" encounter. She indicated preferring virtual for this encounter.   Reason COVID-19*  Social distancing based on CDC and AMA recommendations.   I contacted Beth Wells 04/13/2021 via video conference.      I clearly identified myself as BGillis Santa MD. I verified that I was speaking with the correct person using two identifiers (Name: KNickisha Hum and date of birth: 7Nov 23, 1977.  Consent I sought verbal advanced consent from KBabette Relicfor virtual visit interactions. I informed Beth Wells of possible security and privacy concerns, risks, and limitations associated with providing "not-in-person" medical evaluation and management services. I also informed Beth Wells of the availability of "in-person" appointments. Finally, I informed her that there would be a  charge for the virtual visit and that she could be  personally, fully or partially, financially responsible for it. Ms. LMaddingexpressed understanding and agreed to proceed.   Historic Elements   Beth Wells a 45y.o. year old, female patient evaluated today after our last contact on 03/30/2021. Ms. LLague has a past medical history of [place] ([place] ([place]), Carcinoma of upper-outer quadrant of right breast in female, estrogen receptor negative ([place] (187/68/1157, Complication of anesthesia, H/O foot surgery (08/2018), History of kidney stones, Hypertension, and PONV (postoperative nausea and vomiting). She also  has a past surgical history that includes Kidney stone surgery; Cervical cone biopsy; Dilation and evacuation (N/A, 03/12/2018); laparoscopy (N/A, 06/07/2019); Laparoscopic ovarian cystectomy (Right, 06/07/2019); Breast biopsy (Right, 07/06/2020); Bunionectomy (Right); Total mastectomy (Right, 08/05/2020); Portacath placement (Left, 08/05/2020); Breast reconstruction with placement of tissue expander and flex hd (acellular hydrated dermis) (Right, 08/05/2020); Removal of tissue expander and placement of implant (Right, 03/09/2021); Mastopexy (Left, 03/09/2021); and Reconstruction breast immediate / delayed w/ tissue expander (Right, 03/09/2021). Ms. LWillisonhas a current medication list which includes the following prescription(s): alpha-lipoic acid, amphetamine-dextroamphetamine, lisinopril, NONFORMULARY OR COMPOUNDED ITEM, pregabalin, probiotic product, tramadol, tramadol, and trolamine salicylate. She  reports that she has never smoked. She has never used smokeless tobacco. She reports previous alcohol use. She reports that she does not use drugs. Ms. LSenegalhas No Known Allergies.   HPI  Today, she is being contacted for a post-procedure assessment.   Post-Procedure Evaluation  Procedure (03/30/2021):  Type: Palliative Intravenous lidocaine infusion #2 Region: Systemic Level:  Upper Extremity IV access  Laterality: Please see nurses note. Anxiolysis: Please see nurses note.  Effectiveness during initial hour after procedure (Ultra-Short Term Relief): 0 %   Local anesthetic used: Long-acting (4-6 hours) Effectiveness: Defined as any  analgesic benefit obtained secondary to the administration of local anesthetics. This carries significant diagnostic value as to the etiological location, or anatomical origin, of the pain. Duration of benefit is expected to coincide with the duration of the local anesthetic used.  Effectiveness during initial 4-6 hours after procedure (Short-Term Relief): 0 %   Long-term benefit: Defined as any relief past the pharmacologic duration of the local anesthetics.  Effectiveness past the initial 6 hours after procedure (Long-Term Relief): 50 %   Benefits, current: Defined as benefit present at the time of this evaluation.   Analgesia:  50% improved    Pharmacotherapy Assessment   Analgesic: tramadol 100 mg ER, continue tramadol IR 50 mg twice daily for breakthrough pain    Monitoring: [place] PMP: PDMP not reviewed this encounter.       Pharmacotherapy: No side-effects or adverse reactions reported. Compliance: No problems identified. Effectiveness: Clinically acceptable. Plan: Refer to "POC". UDS:  Summary  Date Value Ref Range Status  03/22/2021 Note  Final    Comment:    ==================================================================== [employer] Select 13 (MW) ==================================================================== Test                             Result       Flag       Units  Drug Present and Declared for Prescription Verification   Amphetamine                    >5263        EXPECTED   ng/mg creat    Amphetamine is available as a schedule II prescription drug.    Tramadol                       >2632        EXPECTED   ng/mg creat   O-Desmethyltramadol            >2632        EXPECTED   ng/mg creat    N-Desmethyltramadol            >2632        EXPECTED   ng/mg creat    Source of tramadol is a prescription medication. O-desmethyltramadol    and N-desmethyltramadol are expected metabolites of tramadol.  ==================================================================== Test                      Result    Flag   Units      Ref Range   Creatinine              190              mg/dL      >=20 ==================================================================== Declared Medications:  The flagging and interpretation on this report are based on the  following declared medications.  Unexpected results may arise from  inaccuracies in the declared medications.   **Note: The testing scope of this panel includes these medications:   Amphetamine (Adderall)  Tramadol (Ultram)   **Note: The testing scope of this panel does not include the  following reported medications:   Lisinopril (Zestril)  Pregabalin (Lyrica)  Trolamine Salicylate (topical) (Aspercreme) ==================================================================== For clinical consultation, please call [phone]. ====================================================================      Laboratory Chemistry Profile   Renal Lab Results  Component Value Date   BUN 27 (H) 01/24/2021   CREATININE 0.79 01/24/2021   GFRAA >60 06/07/2019   GFRNONAA >  60 01/24/2021    Hepatic Lab Results  Component Value Date   AST 17 01/24/2021   ALT 26 01/24/2021   ALBUMIN 4.0 01/24/2021   ALKPHOS 98 01/24/2021    Electrolytes Lab Results  Component Value Date   NA 138 01/24/2021   K 4.0 01/24/2021   CL 102 01/24/2021   CALCIUM 9.5 01/24/2021    Bone No results found for: VD25OH, ZD664QI3KVQ, QV9563OV5, IE3329JJ8, 25OHVITD1, 25OHVITD2, 25OHVITD3, TESTOFREE, TESTOSTERONE  Inflammation (CRP: Acute Phase) (ESR: Chronic Phase) Lab Results  Component Value Date   LATICACIDVEN 1.4 10/18/2020         Note: Above Lab results  reviewed.  Imaging  DG Chest 2 View CLINICAL DATA:  Breast cancer patient with fever today. Chills and weakness. Recent mastectomy.  EXAM: CHEST - 2 VIEW  COMPARISON:  08/05/2020  FINDINGS: Hyperinflation of the lungs. Power port type left central venous catheter with tip over the low SVC region. No pneumothorax. Heart size and pulmonary vascularity are normal. Postoperative right mastectomy with breast reconstruction. Lungs are clear. No airspace disease, consolidation, or edema. No pleural effusions. No pneumothorax. Mediastinal contours appear intact.  IMPRESSION: No active cardiopulmonary disease.  Electronically Signed   By: Lucienne Capers M.D.   On: 10/18/2020 23:25  Assessment  The primary encounter diagnosis was Chronic pain syndrome. Diagnoses of Small fiber neuropathy, Idiopathic progressive neuropathy, and Idiopathic small fiber sensory neuropathy were also pertinent to this visit.  Plan of Care   Patient continues to have persistent neuropathic pain primarily affecting her lower extremities.  She is on alpha lipoic acid 500 mg daily, Lyrica 100 mg twice daily which I recommended she decrease her nighttime dose to 200 mg in continue her daytime dose at 100 mg.  She is also on tramadol extended release 20 mg daily as well as 50 mg twice daily as needed for breakthrough pain.  She is status post lidocaine infusion.  I have informed her that I have optimized her pain regimen especially after increasing her Lyrica.  She is also tried lidocaine infusion with mild response.  We can consider repeating that.  At this point, I have limited options for her.  I instructed her to reach out to Dr. Brigitte Pulse with neurology to see if he has any other recommendations or even seek a second opinion.  Patient endorsed understanding.  I informed her that I will see her back August 18 for medication management.  Follow-up plan:   Return for Keep sch. appt.    Recent Visits Date Type Provider  Dept  03/30/21 Procedure visit Gillis Santa, MD [facility]  03/22/21 Office Visit Gillis Santa, MD [facility]  02/16/21 Procedure visit Gillis Santa, MD [facility]  02/02/21 Office Visit Gillis Santa, MD [facility]  Showing recent visits within past 90 days and meeting all other requirements Today's Visits Date Type Provider Dept  04/13/21 Telemedicine Gillis Santa, MD [facility]  Showing today's visits and meeting all other requirements Future Appointments Date Type Provider Dept  05/12/21 Appointment Gillis Santa, MD [facility]  Showing future appointments within next 90 days and meeting all other requirements I discussed the assessment and treatment plan with the patient. The patient was provided an opportunity to ask questions and all were answered. The patient agreed with the plan and demonstrated an understanding of the instructions.  Patient advised to call back or seek an in-person evaluation if the symptoms or condition worsens.  Duration of encounter:  51mnutes.  Note by: BGillis Santa MD Date: 04/13/2021; Time: 2:25 PM

## 2021-04-25 ENCOUNTER — Inpatient Hospital Stay (HOSPITAL_BASED_OUTPATIENT_CLINIC_OR_DEPARTMENT_OTHER): Payer: BC Managed Care – PPO | Admitting: Internal Medicine

## 2021-04-25 ENCOUNTER — Encounter: Payer: Self-pay | Admitting: Internal Medicine

## 2021-04-25 ENCOUNTER — Inpatient Hospital Stay: Payer: BC Managed Care – PPO | Attending: Internal Medicine

## 2021-04-25 DIAGNOSIS — Z171 Estrogen receptor negative status [ER-]: Secondary | ICD-10-CM | POA: Diagnosis not present

## 2021-04-25 DIAGNOSIS — I10 Essential (primary) hypertension: Secondary | ICD-10-CM | POA: Insufficient documentation

## 2021-04-25 DIAGNOSIS — Z79899 Other long term (current) drug therapy: Secondary | ICD-10-CM | POA: Insufficient documentation

## 2021-04-25 DIAGNOSIS — C50411 Malignant neoplasm of upper-outer quadrant of right female breast: Secondary | ICD-10-CM

## 2021-04-25 DIAGNOSIS — D709 Neutropenia, unspecified: Secondary | ICD-10-CM | POA: Insufficient documentation

## 2021-04-25 DIAGNOSIS — Z853 Personal history of malignant neoplasm of breast: Secondary | ICD-10-CM | POA: Diagnosis not present

## 2021-04-25 DIAGNOSIS — G629 Polyneuropathy, unspecified: Secondary | ICD-10-CM | POA: Insufficient documentation

## 2021-04-25 LAB — CBC WITH DIFFERENTIAL/PLATELET
Abs Immature Granulocytes: 0.01 10*3/uL (ref 0.00–0.07)
Basophils Absolute: 0 10*3/uL (ref 0.0–0.1)
Basophils Relative: 0 %
Eosinophils Absolute: 0.1 10*3/uL (ref 0.0–0.5)
Eosinophils Relative: 4 %
HCT: 36 % (ref 36.0–46.0)
Hemoglobin: 11.9 g/dL — ABNORMAL LOW (ref 12.0–15.0)
Immature Granulocytes: 0 %
Lymphocytes Relative: 29 %
Lymphs Abs: 0.8 10*3/uL (ref 0.7–4.0)
MCH: 30.1 pg (ref 26.0–34.0)
MCHC: 33.1 g/dL (ref 30.0–36.0)
MCV: 90.9 fL (ref 80.0–100.0)
Monocytes Absolute: 0.3 10*3/uL (ref 0.1–1.0)
Monocytes Relative: 11 %
Neutro Abs: 1.5 10*3/uL — ABNORMAL LOW (ref 1.7–7.7)
Neutrophils Relative %: 56 %
Platelets: 238 10*3/uL (ref 150–400)
RBC: 3.96 MIL/uL (ref 3.87–5.11)
RDW: 14.8 % (ref 11.5–15.5)
WBC: 2.8 10*3/uL — ABNORMAL LOW (ref 4.0–10.5)
nRBC: 0 % (ref 0.0–0.2)

## 2021-04-25 LAB — COMPREHENSIVE METABOLIC PANEL
ALT: 21 U/L (ref 0–44)
AST: 22 U/L (ref 15–41)
Albumin: 4.4 g/dL (ref 3.5–5.0)
Alkaline Phosphatase: 64 U/L (ref 38–126)
Anion gap: 8 (ref 5–15)
BUN: 35 mg/dL — ABNORMAL HIGH (ref 6–20)
CO2: 26 mmol/L (ref 22–32)
Calcium: 9.6 mg/dL (ref 8.9–10.3)
Chloride: 102 mmol/L (ref 98–111)
Creatinine, Ser: 0.96 mg/dL (ref 0.44–1.00)
GFR, Estimated: >60 mL/min (ref >60)
Glucose, Bld: 92 mg/dL (ref 70–99)
Potassium: 4.6 mmol/L (ref 3.5–5.1)
Sodium: 136 mmol/L (ref 135–145)
Total Bilirubin: 0.3 mg/dL (ref 0.3–1.2)
Total Protein: 7 g/dL (ref 6.5–8.1)

## 2021-04-25 NOTE — Progress Notes (Signed)
[facility] CONSULT NOTE  Patient Care Team: Jose-Mathews, Jessnie, MD as PCP - General (Family Medicine) Brahmanday, Govinda R, [name] Lateef, Bilal, MD as Consulting Physician (Pain Medicine)  CHIEF COMPLAINTS/PURPOSE OF CONSULTATION: Breast cancer  #  Oncology History Overview Note  # OCT 2021-RIGHT BREAST- TRIPLE NEGATIVE IMC; [US/mammo-7mm]; Dr.Cintron; OCT 2021-USThere is a 7 mm mass in the right breast at 10 o'clock, favored to represent a complicated cyst;  No evidence of right axillary lymphadenopathy].   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 [Dr.Shah]- skin biopsy- on cymblata+ Lyrica  # DEC 2021-status post evaluation with reproductive endocrinology [9-10% chance of viable pregnancy] # # SURVIVORSHIP:   # GENETICS: BARD  DIAGNOSIS: Breast cancer-triple negative  STAGE:  I       ;  GOALS: cure  CURRENT/MOST RECENT THERAPY : AC-T    Carcinoma of upper-outer quadrant of right breast in female, estrogen receptor negative (HCC)  07/13/2020 Initial Diagnosis   Carcinoma of upper-outer quadrant of right breast in female, estrogen receptor negative (HCC)    Genetic Testing   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 The report date is 08/02/2020.  The Multi-Cancer Panel offered by [facility] includes sequencing and/or deletion duplication testing of the following 85 genes: AIP, ALK, APC, ATM, AXIN2,BAP1,  BARD1, BLM, BMPR1A, BRCA1, BRCA2, BRIP1, CASR, CDC73, CDH1, CDK4, CDKN1B, CDKN1C, CDKN2A (p14ARF), CDKN2A (p16INK4a), CEBPA, CHEK2, CTNNA1, DICER1, DIS3L2, EGFR (c.2369C>T, p.Thr790Met variant only), EPCAM (Deletion/duplication testing only), FH, FLCN, GATA2, GPC3, GREM1 (Promoter region deletion/duplication testing only), HOXB13 (c.251G>A,  p.Gly84Glu), HRAS, KIT, MAX, MEN1, MET, MITF (c.952G>A, p.Glu318Lys variant only), MLH1, MSH2, MSH3, MSH6, MUTYH, NBN, NF1, NF2, NTHL1, PALB2, PDGFRA, PHOX2B, PMS2, POLD1, POLE, POT1, PRKAR1A, PTCH1, PTEN, RAD50, RAD51C, RAD51D, RB1, RECQL4, RET, RNF43, RUNX1, SDHAF2, SDHA (sequence changes only), SDHB, SDHC, SDHD, SMAD4, SMARCA4, SMARCB1, SMARCE1, STK11, SUFU, TERC, TERT, TMEM127, TP53, TSC1, TSC2, VHL, WRN and WT1.    09/10/2020 -  Chemotherapy    Patient is on Treatment Plan: BREAST ADJUVANT DOSE DENSE AC Q14D / PACLITAXEL Q7D          HISTORY OF PRESENTING ILLNESS:  Beth Wells 45 y.o.  female   with stage I triple negative breast cancer s/p adjuvant chemotherapy is here for follow-up.  In the interim patient underwent breast reconstruction.  Patient's Taxol chemotherapy was discontinued after cycle #6 because of poor tolerance worsening of neuropathy.  Patient is about 3 months post chemotherapy  Unfortunately patient continues to have burning pain all over.  She has been evaluated by neurology; also followed with pain clinic s/p lidocaine infusion.  She is currently on Lyrica only; and tramadol for pain.  She is quite distraught.  Tearful.  Complains of hot flashes.  Complains of painful coitus.  Also complains of small joints hurting.  Review of Systems  Constitutional:  Positive for malaise/fatigue. Negative for chills, diaphoresis, fever and weight loss.  HENT:  Negative for nosebleeds and sore throat.   Eyes:  Negative for double vision.  Respiratory:  Negative for cough, hemoptysis, sputum production, shortness of breath and wheezing.   Cardiovascular:  Negative for chest pain, palpitations, orthopnea and leg swelling.  Gastrointestinal:  Negative for abdominal pain, blood in stool, diarrhea, heartburn, melena and vomiting.  Genitourinary:  Negative for dysuria, frequency   and urgency.  Musculoskeletal:  Positive for joint pain and myalgias. Negative for back pain.  Skin:   Positive for itching and rash.  Neurological:  Positive for tingling. Negative for dizziness, focal weakness, weakness and headaches.  Endo/Heme/Allergies:  Does not bruise/bleed easily.  Psychiatric/Behavioral:  Negative for depression. The patient is not nervous/anxious and does not have insomnia.     MEDICAL HISTORY:  Past Medical History:  Diagnosis Date   Breast cancer (HCC) 08/05/2020   Carcinoma of upper-outer quadrant of right breast in female, estrogen receptor negative (HCC) 07/13/2020   Complication of anesthesia    hard to wake up-very dizzy feeling like she was going to pass out   H/O foot surgery 08/2018   History of kidney stones    h/o   Hypertension    PONV (postoperative nausea and vomiting)    n/v aftter kidney stone surgery    SURGICAL HISTORY: Past Surgical History:  Procedure Laterality Date   BREAST BIOPSY Right 07/06/2020   u/s bx Q clip path pending   BREAST RECONSTRUCTION WITH PLACEMENT OF TISSUE EXPANDER AND FLEX HD (ACELLULAR HYDRATED DERMIS) Right 08/05/2020   Procedure: RIGHT BREAST RECONSTRUCTION WITH PLACEMENT OF TISSUE EXPANDER AND FLEX HD (ACELLULAR HYDRATED DERMIS);  Surgeon: Dillingham, Claire S, DO;  Location: [facility];  Service: Plastics;  Laterality: Right;   BUNIONECTOMY Right    titanium pin   CERVICAL CONE BIOPSY     DILATION AND EVACUATION N/A 03/12/2018   Procedure: DILATATION AND EVACUATION;  Surgeon: Beasley, Bethany, MD;  Location: [facility];  Service: Gynecology;  Laterality: N/A;   KIDNEY STONE SURGERY     LAPAROSCOPIC OVARIAN CYSTECTOMY Right 06/07/2019   Procedure: LAPAROSCOPIC OVARIAN CYSTECTOMY;  Surgeon: Beasley, Bethany, MD;  Location: [facility];  Service: Gynecology;  Laterality: Right;   LAPAROSCOPY N/A 06/07/2019   Procedure: LAPAROSCOPY DIAGNOSTIC,PERITANEAL BIOPSIES;  Surgeon: Beasley, Bethany, MD;  Location: [facility];  Service: Gynecology;  Laterality: N/A;   MASTOPEXY Left 03/09/2021   Procedure: MASTOPEXY LEFT BREAST;   Surgeon: Dillingham, Claire S, DO;  Location: [facility];  Service: Plastics;  Laterality: Left;   PORTACATH PLACEMENT Left 08/05/2020   Procedure: INSERTION PORT-A-CATH;  Surgeon: Cintron-Diaz, Edgardo, MD;  Location: [facility];  Service: General;  Laterality: Left;   RECONSTRUCTION BREAST IMMEDIATE / DELAYED W/ TISSUE EXPANDER Right 03/09/2021   REMOVAL OF TISSUE EXPANDER AND PLACEMENT OF IMPLANT Right 03/09/2021   Procedure: Removal of right breast expander with placement of implant;  Surgeon: Dillingham, Claire S, DO;  Location: [facility];  Service: Plastics;  Laterality: Right;  2.5 hours   TOTAL MASTECTOMY Right 08/05/2020   Procedure: TOTAL MASTECTOMY w/ Sentinel Node;  Surgeon: Cintron-Diaz, Edgardo, MD;  Location: [facility];  Service: General;  Laterality: Right;    SOCIAL HISTORY: Social History   Socioeconomic History   Marital status: Married    Spouse name: Doug   Number of children: 0   Years of education: Not on file   Highest education level: Not on file  Occupational History   Occupation: Speech Language Pathologist    Employer: [employer]  Tobacco Use   Smoking status: Never   Smokeless tobacco: Never  Vaping Use   Vaping Use: Never used  Substance and Sexual Activity   Alcohol use: Not Currently   Drug use: Never   Sexual activity: Yes    Birth control/protection: Condom  Other Topics Concern   Not on file  Social History Narrative   Speech   language pathologist/[employer]; never smoked; rare alcohol. No children. Lives in [place].    Social Determinants of Health   Financial Resource Strain: Low Risk    Difficulty of Paying Living Expenses: Not hard at all  Food Insecurity: Not on file  Transportation Needs: No Transportation Needs   Lack of Transportation (Medical): No   Lack of Transportation (Non-Medical): No  Physical Activity: Not on file  Stress: Stress Concern Present   Feeling of Stress : Very much   Social Connections: Not on file  Intimate Partner Violence: Not on file    FAMILY HISTORY: Family History  Problem Relation Age of Onset   Cancer Other        great aunt- GI cancer   Breast cancer Neg Hx     ALLERGIES:  has No Known Allergies.  MEDICATIONS:  Current Outpatient Medications  Medication Sig Dispense Refill   ALPHA-LIPOIC ACID PO Take 500 mg by mouth daily.      amphetamine-dextroamphetamine (ADDERALL XR) 30 MG 24 hr capsule Take 30 mg by mouth daily.      lisinopril (ZESTRIL) 5 MG tablet Take 5 mg by mouth every evening.     NONFORMULARY OR COMPOUNDED ITEM Sig: Apply 1-2 gm(s) (2-4 pumps) to affected area, 3-4 times/day. (1 pump = 0.5 gm) 1 each 2   pregabalin (LYRICA) 100 MG capsule Take 1 capsule (100 mg total) by mouth 2 (two) times daily. 180 capsule 1   Probiotic Product (PROBIOTIC ADVANCED PO) Take 1 capsule by mouth daily.      traMADol (ULTRAM) 50 MG tablet Take 1 tablet (50 mg total) by mouth every 12 (twelve) hours as needed for severe pain. 60 tablet 1   traMADol (ULTRAM-ER) 200 MG 24 hr tablet Take 1 tablet (200 mg total) by mouth daily. 30 tablet 1   trolamine salicylate (ASPERCREME) 10 % cream Apply 1 application topically as needed for muscle pain.     No current facility-administered medications for this visit.      .  PHYSICAL EXAMINATION: ECOG PERFORMANCE STATUS: 0 - Asymptomatic  Vitals:   04/25/21 1024  BP: (!) 128/97  Pulse: 87  Resp: 16  Temp: 98.4 F (36.9 C)  SpO2: 100%   Filed Weights   04/25/21 1024  Weight: 101 lb (45.8 kg)    Physical Exam Constitutional:      Comments: Alone; ambulating independently.   HENT:     Head: Normocephalic and atraumatic.     Mouth/Throat:     Pharynx: No oropharyngeal exudate.  Eyes:     Pupils: Pupils are equal, round, and reactive to light.  Cardiovascular:     Rate and Rhythm: Normal rate and regular rhythm.  Pulmonary:     Effort: Pulmonary effort is normal. No respiratory  distress.     Breath sounds: Normal breath sounds. No wheezing.  Abdominal:     General: Bowel sounds are normal. There is no distension.     Palpations: Abdomen is soft. There is no mass.     Tenderness: There is no abdominal tenderness. There is no guarding or rebound.  Musculoskeletal:        General: No tenderness. Normal range of motion.     Cervical back: Normal range of motion and neck supple.  Skin:    General: Skin is warm.  Neurological:     Mental Status: She is alert and oriented to person, place, and time.  Psychiatric:        Mood and Affect:   Affect normal.     LABORATORY DATA:  I have reviewed the data as listed Lab Results  Component Value Date   WBC 2.8 (L) 04/25/2021   HGB 11.9 (L) 04/25/2021   HCT 36.0 04/25/2021   MCV 90.9 04/25/2021   PLT 238 04/25/2021   Recent Labs    01/17/21 0850 01/24/21 0830 04/25/21 0956  NA 140 138 136  K 3.8 4.0 4.6  CL 105 102 102  CO2 _0 GLUCOSE 94 79 92  BUN 26* 27* 35*  CREATININE 0.96 0.79 0.96  CALCIUM 9.2 9.5 9.6  GFRNONAA >60 >60 >60  PROT 6.9 7.1 7.0  ALBUMIN 4.0 4.0 4.4  AST _1 ALT _2 ALKPHOS 70 98 64  BILITOT 0.3 0.3 0.3    RADIOGRAPHIC STUDIES: I have personally reviewed the radiological images as listed and agreed with the findings in the report. No results found.  ASSESSMENT & PLAN:   Carcinoma of upper-outer quadrant of right breast in female, estrogen receptor negative ([place]) # Stage I triple negative breast cancer on adjuvant chemotherapy with s/p 4 cycles of AC; Taxol s/p 6 treatments. DISCONTINUED further Taxol chemotherapy given worsening neuropathy.  Clinically stable no evidence of any recurrence.   #Neutropenia ANC 1.5; asymptomatic; other stable hemoglobin/platelets.  Monitor for now.  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 Lyrica 200 midAM; 100 at night.  On Tramadol ER 200 mg; s/p  lidocaine infusion [Dr.Lateef].  Recommend considering further evaluation at [facility].  We will check with breast navigator regarding acupuncture again.  #Low estrogen state-s/p Lupron [ovarian protection]-no menstrual cycles yet [Dr. Beasley] hot flashes 2 to 3/day-discussed regarding use of black cohosh/painful coitus-reasonable to try vaginal estrogens.  Given the quite severe symptoms/and given triple negative disease-I think it is reasonable to proceed with above measures.  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 Toy Baker 2022]-hold off prophylactic BSO until  #Small joint arthritis-question low estrogen state versus others.  Recommend calcium plus vitamin D  # medi-port/IV access- Stable; discussed re: pro and cons of keeping the port vs. Explantation.recommend port explantation.  # DISPOSITION:  # refer to Dr.Cintron re: port explanation.  # follow up as planned in 4 month MD; labs- cbc/cmp-Dr.B   All questions were answered. The patient/family knows to call the clinic with any problems, questions or concerns.    Cammie Sickle, MD 04/25/2021 11:37 PM

## 2021-04-25 NOTE — Assessment & Plan Note (Addendum)
#   Stage I triple negative breast cancer on adjuvant chemotherapy with s/p 4 cycles of AC; Taxol s/p 6 treatments. DISCONTINUED further Taxol chemotherapy given worsening neuropathy.  Clinically stable no evidence of any recurrence.   #Neutropenia ANC 1.5; asymptomatic; other stable hemoglobin/platelets.  Monitor for now.  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 Lyrica 200 midAM; 100 at night.  On Tramadol ER 200 mg; s/p lidocaine infusion [Dr.Lateef].  Recommend considering further evaluation at [facility].  We will check with breast navigator regarding acupuncture again.  #Low estrogen state-s/p Lupron [ovarian protection]-no menstrual cycles yet [Dr. Beasley] hot flashes 2 to 3/day-discussed regarding use of black cohosh/painful coitus-reasonable to try vaginal estrogens.  Given the quite severe symptoms/and given triple negative disease-I think it is reasonable to proceed with above measures.  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 Toy Baker 2022]-hold off prophylactic BSO until  #Small joint arthritis-question low estrogen state versus others.  Recommend calcium plus vitamin D  # medi-port/IV access- Stable; discussed re: pro and cons of keeping the port vs. Explantation.recommend port explantation.  # DISPOSITION:  # refer to Dr.Cintron re: port explanation.  # follow up as planned in 4 month MD; labs- cbc/cmp-Dr.B

## 2021-04-25 NOTE — Progress Notes (Signed)
Has had breast reconstruction surgery since last seen on 06/15.

## 2021-05-02 ENCOUNTER — Ambulatory Visit: Payer: BC Managed Care – PPO | Admitting: Student in an Organized Health Care Education/Training Program

## 2021-05-04 ENCOUNTER — Encounter: Payer: Self-pay | Admitting: Student in an Organized Health Care Education/Training Program

## 2021-05-04 ENCOUNTER — Ambulatory Visit
Payer: BC Managed Care – PPO | Attending: Student in an Organized Health Care Education/Training Program | Admitting: Student in an Organized Health Care Education/Training Program

## 2021-05-04 ENCOUNTER — Other Ambulatory Visit: Payer: Self-pay

## 2021-05-04 VITALS — BP 142/75 | HR 90 | Temp 98.2°F | Resp 16 | Ht 62.0 in | Wt 102.6 lb

## 2021-05-04 DIAGNOSIS — C50411 Malignant neoplasm of upper-outer quadrant of right female breast: Secondary | ICD-10-CM | POA: Insufficient documentation

## 2021-05-04 DIAGNOSIS — G629 Polyneuropathy, unspecified: Secondary | ICD-10-CM

## 2021-05-04 DIAGNOSIS — G894 Chronic pain syndrome: Secondary | ICD-10-CM | POA: Diagnosis present

## 2021-05-04 DIAGNOSIS — Z171 Estrogen receptor negative status [ER-]: Secondary | ICD-10-CM | POA: Diagnosis not present

## 2021-05-04 DIAGNOSIS — R202 Paresthesia of skin: Secondary | ICD-10-CM

## 2021-05-04 DIAGNOSIS — G603 Idiopathic progressive neuropathy: Secondary | ICD-10-CM | POA: Diagnosis not present

## 2021-05-04 DIAGNOSIS — G608 Other hereditary and idiopathic neuropathies: Secondary | ICD-10-CM | POA: Diagnosis not present

## 2021-05-04 MED ORDER — LIDOCAINE IN D5W 4-5 MG/ML-% IV SOLN
4.0000 mg/min | INTRAVENOUS | Status: AC
  Administered 2021-05-04: 4 mg/min via INTRAVENOUS

## 2021-05-04 NOTE — Progress Notes (Signed)
PROVIDER NOTE: Information contained herein reflects review and annotations entered in association with encounter. Interpretation of such information and data should be left to medically-trained personnel. Information provided to patient can be located elsewhere in the medical record under "Patient Instructions". Document created using STT-dictation technology, any transcriptional errors that may result from process are unintentional.    Patient: Beth Wells  Service Category: Procedure  Provider: Gillis Santa, MD  DOB: 08-01-1976  DOS: 05/04/2021  Location: [facility]  MRN: [id number]  Setting: Ambulatory - outpatient  Referring Provider: Elza Rafter, *  Type: Established Patient  Specialty: Interventional Pain Management  PCP: Elza Rafter, MD   Primary Reason for Visit: Interventional Pain Management Treatment. CC: neuropathic pain  Procedure:           Type: Palliative Intravenous lidocaine infusion #3 Region: Systemic Level: Upper Extremity IV access Laterality: Please see nurses note.    Indications: 1. Chronic pain syndrome   2. Small fiber neuropathy   3. Idiopathic progressive neuropathy   4. Idiopathic small fiber sensory neuropathy   5. Carcinoma of upper-outer quadrant of right breast in female, estrogen receptor negative ([place])   6. Paresthesias     Pain Score: Pre-procedure: 5 /10 Post-procedure: 5 /10   Pre-op H&P Assessment:  Beth Wells is a 45 y.o. (year old), female patient, seen today for interventional treatment. She  has a past surgical history that includes Kidney stone surgery; Cervical cone biopsy; Dilation and evacuation (N/A, 03/12/2018); laparoscopy (N/A, 06/07/2019); Laparoscopic ovarian cystectomy (Right, 06/07/2019); Breast biopsy (Right, 07/06/2020); Bunionectomy (Right); Total mastectomy (Right, 08/05/2020); Portacath placement (Left, 08/05/2020); Breast reconstruction with placement of tissue expander and flex hd  (acellular hydrated dermis) (Right, 08/05/2020); Removal of tissue expander and placement of implant (Right, 03/09/2021); Mastopexy (Left, 03/09/2021); and Reconstruction breast immediate / delayed w/ tissue expander (Right, 03/09/2021). Beth Wells has a current medication list which includes the following prescription(s): alpha-lipoic acid, amphetamine-dextroamphetamine, lisinopril, NONFORMULARY OR COMPOUNDED ITEM, pregabalin, probiotic product, tramadol, tramadol, and trolamine salicylate. Her primarily concern today is the Leg Pain  Initial Vital Signs:  Pulse/HCG Rate: 90ECG Heart Rate: 92 Temp: 98.2 F (36.8 C) Resp: 16 BP: 133/68 SpO2: 98 %  BMI: Estimated body mass index is 18.77 kg/m as calculated from the following:   Height as of this encounter: '5\' 2"'$  (1.575 m).   Weight as of this encounter: 102 lb 9.6 oz (46.5 kg).  Risk Assessment: Allergies: Reviewed. She has No Known Allergies.  Allergy Precautions: None required Coagulopathies: Reviewed. None identified.  Blood-thinner therapy: None at this time Active Infection(s): Reviewed. None identified. Beth Wells is afebrile  Site Confirmation: Beth Wells was asked to confirm the procedure and laterality before marking the site Procedure checklist: Completed Consent: Before the procedure and under the influence of no sedative(s), amnesic(s), or anxiolytics, the patient was informed of the treatment options, risks and possible complications. To fulfill our ethical and legal obligations, as recommended by the American Medical Association's Code of Ethics, I have informed the patient of my clinical impression; the nature and purpose of the treatment or procedure; the risks, benefits, and possible complications of the intervention; the alternatives, including doing nothing; the risk(s) and benefit(s) of the alternative treatment(s) or procedure(s); and the risk(s) and benefit(s) of doing nothing. The patient was provided information about the  general risks and possible complications associated with any invasive procedure. These may include, but are not limited to: failure to achieve desired goals; pain; worsening of initial condition; infections; bleeding;  organ or nerve damage; allergic reactions; and death. In addition, the patient was informed of those risks and complications associated to this procedure, such as failure to decrease pain; infection; bleeding; phlebitis; vascular extravasation; tissue necrosis; tenderness at IV access site; skin or nerve damage with subsequent damage to sensory, motor, and/or autonomic systems; worsening of the pain; persistent pain, numbness, and/or weakness of one or several areas of the body; cardiac dysrhythmias; seizure; stroke; and/or death. Furthermore, the patient was informed of those risks and complications associated with the medications used during the procedure. These include, but are not limited to: allergic reactions (i.e.: anaphylactic or anaphylactoid reactions); cardiac conduction blockade; poisoning; toxicity; CNS depression; cardiovascular depression and collapse; muscle twitching; tonic-clonic seizures; convulsions; loss of consciousness; coma; respiratory depression; arrest; and/or death. Finally, the patient was informed that Medicine is not an exact science; therefore, there is also the possibility of unforeseen or unpredictable risks and/or possible complications that may result in a catastrophic outcome. The patient indicated having understood very clearly. We have given the patient no guarantees and we have made no promises. Enough time was given to the patient to ask questions, all of which were answered to the patient's satisfaction. Beth Wells has indicated that she wanted to continue with the procedure. Attestation: I, the ordering provider, attest that I have discussed with the patient the benefits, risks, side-effects, alternatives, likelihood of achieving goals, and potential  problems during recovery for the procedure that I have provided informed consent. Date  Time: 05/04/2021  1:13 PM  Pre-Procedure Preparation:  Monitoring: As per clinic protocol. Respiration, ETCO2, SpO2, BP, heart rate and rhythm monitor placed and checked for adequate function Safety Precautions: Patient was assessed for positional comfort and pressure points before starting the procedure. Time-out: I initiated and conducted the "Time-out" before starting the procedure, as per protocol. The patient was asked to participate by confirming the accuracy of the "Time Out" information. Verification of the correct person, site, and procedure were performed and confirmed by me, the nursing staff, and the patient. "Time-out" conducted as per Joint Commission's Universal Protocol (UP.01.01.01). Time:    Description of Procedure:          Target Area: Intravenous Approach: Intravenous angiocath approach. Area Prepped: Antecubital DuraPrep (Iodine Povacrylex [0.7% available iodine] and Isopropyl Alcohol, 74% w/w) Safety Precautions: Medications properly checked for expiration dates. SDV (single dose vial) medications used.  Dose Calculation: Lidocaine preparation: 2 grams of IV lidocaine in 500 mL of D5W (4 mg/mL) (0.4% Lidocaine) Maximum Lidocaine Dose: 4 mg/kg x 102 lb 9.6 oz (46.5 kg) =  181.6 mg.  Calculated infusion rate: 181.6 mg divided by 4 mg/mL = 45.4, will decrease to 45 mL in 60 minutes (45 mL/hr) Weight: 102 lb 9.6 oz (46.5 kg) Set rate: 48m/hr Duration of Infusion: 1 hour  Description of the Procedure: Protocol guidelines were followed. The patient was placed in position. Informed consent was obtained.  She was cautioned to be on the look out for prodromal symptoms of a possible impending seizure, such as: new onset tinnitus; perioral, circumoral, or tongue numbness; or a metallic taste in her mouth, lightheadedness; dizziness; visual or auditory disturbances; disorientation; profound  drowsiness; or muscle twitching. An IV was started and the patient's dose calculated as above. The infusion was carried out with a nurse at her side 100% of the time, monitoring her vitals as per protocol. Pre-procedure sedation was started 5-10 minutes before infusion and midazolam was kept at bedside during the entire procedure, along  with CPR equipment, as per protocol.  Vitals:   05/04/21 1420 05/04/21 1425 05/04/21 1430 05/04/21 1436  BP: 132/72 134/74 (!) 150/76 (!) 142/75  Pulse:      Resp: '16 16 16 16  '$ Temp:      SpO2: 98% 99% 99% 99%  Weight:      Height:        Start Time:   hrs. End Time:   hrs. Materials: IV infusion pump Medication(s): IV Lidocaine.  Post-operative Assessment:  Post-procedure Vital Signs:  Pulse/HCG Rate: 9090 Temp: 98.2 F (36.8 C) Resp: 16 BP: (!) 142/75 SpO2: 99 %  EBL: None  Complications: No immediate post-treatment complications observed by team, or reported by patient.  Note: The patient tolerated the entire procedure well. A repeat set of vitals were taken after the procedure and the patient was kept under observation following institutional policy, for this type of procedure. Post-procedural neurological assessment was performed, showing return to baseline, prior to discharge. The patient was provided with post-procedure discharge instructions, including a section on how to identify potential problems. Should any problems arise concerning this procedure, the patient was given instructions to immediately contact us, at any time, without hesitation. In any case, we plan to contact the patient by telephone for a follow-up status report regarding this interventional procedure.  Comments:  No additional relevant information.  Plan of Care     Medications ordered for procedure: Meds ordered this encounter  Medications   lidocaine (cardiac) 2000 mg in dextrose 5% 500 mL ('4mg'$ /mL) IV infusion    IV Lidocaine 2 grams in 500 mL of Lactated  Ringer's(LR). (4 mg/mL) (0.4% Lidocaine)     Medications administered: We administered lidocaine.  See the medical record for exact dosing rate.   Disposition: Discharge home  Discharge Date & Time: 05/04/2021; 1440 hrs.   Physician-requested Follow-up: Return for Keep sch. appt. Recent Visits Date Type Provider Dept  04/13/21 Telemedicine Gillis Santa, MD [facility]  03/30/21 Procedure visit Gillis Santa, MD [facility]  03/22/21 Office Visit Gillis Santa, MD [facility]  02/16/21 Procedure visit Gillis Santa, MD [facility]  Showing recent visits within past 90 days and meeting all other requirements Today's Visits Date Type Provider Dept  05/04/21 Procedure visit Gillis Santa, MD [facility]  Showing today's visits and meeting all other requirements Future Appointments Date Type Provider Dept  05/12/21 Appointment Gillis Santa, MD [facility]  Showing future appointments within next 90 days and meeting all other requirements Primary Care Physician: Elza Rafter, MD Location: [facility] Note by: Gillis Santa, MD Date: 05/04/2021; Time: 2:58 PM  Disclaimer:  Medicine is not an exact science. The only guarantee in medicine is that nothing is guaranteed. It is important to note that the decision to proceed with this intervention was based on the information collected from the patient. The Data and conclusions were drawn from the patient's questionnaire, the interview, and the physical examination. Because the information was provided in large part by the patient, it cannot be guaranteed that it has not been purposely or unconsciously manipulated. Every effort has been made to obtain as much relevant data as possible for this evaluation. It is important to note that the conclusions that lead to this procedure are derived in large part from the available data. Always take into  account that the treatment will also be dependent on availability of resources and existing treatment guidelines, considered by other Pain Management  Practitioners as being common knowledge and practice, at the time of the intervention. For Medico-Legal purposes, it is also important to point out that variation in procedural techniques and pharmacological choices are the acceptable norm. The indications, contraindications, technique, and results of the above procedure should only be interpreted and judged by a Board-Certified Interventional Pain Specialist with extensive familiarity and expertise in the same exact procedure and technique.

## 2021-05-05 ENCOUNTER — Telehealth: Payer: Self-pay

## 2021-05-05 NOTE — Telephone Encounter (Signed)
Post procedure phone call.  LM 

## 2021-05-12 ENCOUNTER — Other Ambulatory Visit: Payer: Self-pay

## 2021-05-12 ENCOUNTER — Ambulatory Visit
Payer: BC Managed Care – PPO | Attending: Student in an Organized Health Care Education/Training Program | Admitting: Student in an Organized Health Care Education/Training Program

## 2021-05-12 ENCOUNTER — Encounter: Payer: Self-pay | Admitting: Student in an Organized Health Care Education/Training Program

## 2021-05-12 VITALS — BP 125/88 | HR 98 | Temp 97.2°F | Resp 16 | Ht 62.0 in | Wt 102.0 lb

## 2021-05-12 DIAGNOSIS — G894 Chronic pain syndrome: Secondary | ICD-10-CM | POA: Insufficient documentation

## 2021-05-12 DIAGNOSIS — G603 Idiopathic progressive neuropathy: Secondary | ICD-10-CM | POA: Diagnosis not present

## 2021-05-12 DIAGNOSIS — G629 Polyneuropathy, unspecified: Secondary | ICD-10-CM | POA: Diagnosis not present

## 2021-05-12 MED ORDER — TRAMADOL HCL ER 200 MG PO TB24: 200.0000 mg | ORAL_TABLET | Freq: Every day | ORAL | 2 refills | Status: DC

## 2021-05-12 MED ORDER — TRAMADOL HCL 50 MG PO TABS: 50.0000 mg | ORAL_TABLET | Freq: Two times a day (BID) | ORAL | 2 refills | Status: DC | PRN

## 2021-05-12 MED ORDER — CAPSAICIN 0.15 % EX LIQD: CUTANEOUS | 1 refills | Status: DC

## 2021-05-12 NOTE — Addendum Note (Signed)
Addended by: Gillis Santa on: 05/12/2021 03:06 PM   Modules accepted: Orders

## 2021-05-12 NOTE — Progress Notes (Addendum)
PROVIDER NOTE: Information contained herein reflects review and annotations entered in association with encounter. Interpretation of such information and data should be left to medically-trained personnel. Information provided to patient can be located elsewhere in the medical record under "Patient Instructions". Document created using STT-dictation technology, any transcriptional errors that may result from process are unintentional.    Patient: Beth Wells  Service Category: E/M  Provider: Gillis Santa, MD  DOB: [birth year]  DOS: 05/12/2021  Specialty: Interventional Pain Management  MRN: [id number]  Setting: Ambulatory outpatient  PCP: Elza Rafter, MD  Type: Established Patient    Referring Provider: Elza Rafter, *  Location: Office  Delivery: Face-to-face     HPI  Ms. Magdalyn Arenivas, a 45 y.o. year old female, is here today because of her Small fiber neuropathy [G62.9]. Ms. Klang primary complain today is Leg Pain Last encounter: My last encounter with her was on 02/16/2021. Pertinent problems: Ms. Neyer has Small fiber neuropathy; Paresthesias; and Chronic pain syndrome on their pertinent problem list. Pain Assessment: Severity of Chronic pain is reported as a 5 /10. Location: Leg Right, Left/pain is everywhere. Onset: More than a month ago. Quality: Burning. Timing: Constant. Modifying factor(s):  Marland Kitchen Vitals:  height is '5\' 2"'  (1.575 m) and weight is 102 lb (46.3 kg). Her temperature is 97.2 F (36.2 C) (abnormal). Her blood pressure is 125/88 and her pulse is 98. Her respiration is 16 and oxygen saturation is 100%.   Reason for encounter: medication management.    Patient is unclear if the lidocaine infusions are helping her lower extremity neuropathic pain.  She states that she may do some more to determine analgesic response. She is utilizing capsaicin 0.1% lotion which she states is helpful.  We discussed Qutenza application which is 8% capsaicin however that is only  approved for diabetic neuropathy or postherpetic neuralgia.  I will contact the pharmaceutical rep to see if there is any way we can consider this therapy for the patient since she is already receiving a positive response from over-the-counter capsaicin.  Of note, patient has tried gabapentin in the past with limited response.  She is currently on Lyrica 100 mg twice a day.  She is also tried Cymbalta in the past with limited response.  She is also completed a trial of TCA with limited response.  Otherwise I will refill her tramadol, both extended release and immediate release as prescribed.  No change in dose.  I also recommend that she try 0.15% capsaicin cream.  We will hopefully try and get Qutenza covered.   Pharmacotherapy Assessment  Analgesic: tramadol 100 mg ER, continue tramadol IR 50 mg twice daily for breakthrough pain    Monitoring: [place] PMP: PDMP reviewed during this encounter.       Pharmacotherapy: No side-effects or adverse reactions reported. Compliance: No problems identified. Effectiveness: Clinically acceptable.  UDS:  Summary  Date Value Ref Range Status  03/22/2021 Note  Final    Comment:    ==================================================================== [employer] [facility] 13 (MW) ==================================================================== Test                             Result       Flag       Units  Drug Present and Declared for Prescription Verification   Amphetamine                    >5263        EXPECTED  ng/mg creat    Amphetamine is available as a schedule II prescription drug.    Tramadol                       >2632        EXPECTED   ng/mg creat   O-Desmethyltramadol            >2632        EXPECTED   ng/mg creat   N-Desmethyltramadol            >2632        EXPECTED   ng/mg creat    Source of tramadol is a prescription medication. O-desmethyltramadol    and N-desmethyltramadol are expected metabolites of  tramadol.  ==================================================================== Test                      Result    Flag   Units      Ref Range   Creatinine              190              mg/dL      >=20 ==================================================================== Declared Medications:  The flagging and interpretation on this report are based on the  following declared medications.  Unexpected results may arise from  inaccuracies in the declared medications.   **Note: The testing scope of this panel includes these medications:   Amphetamine (Adderall)  Tramadol (Ultram)   **Note: The testing scope of this panel does not include the  following reported medications:   Lisinopril (Zestril)  Pregabalin (Lyrica)  Trolamine Salicylate (topical) (Aspercreme) ==================================================================== For clinical consultation, please call [phone]. ====================================================================      ROS  Constitutional: Denies any fever or chills Gastrointestinal: No reported hemesis, hematochezia, vomiting, or acute GI distress Musculoskeletal: Denies any acute onset joint swelling, redness, loss of ROM, or weakness Neurological:  Lower extremity neuropathic pain  Medication Review  Alpha-Lipoic Acid, Capsaicin, NONFORMULARY OR COMPOUNDED ITEM, Probiotic Product, amphetamine-dextroamphetamine, lisinopril, pregabalin, traMADol, and trolamine salicylate  History Review  Allergy: Ms. Vita has No Known Allergies. Drug: Ms. Dowda  reports no history of drug use. Alcohol:  reports that she does not currently use alcohol. Tobacco:  reports that she has never smoked. She has never used smokeless tobacco. Social: Ms. Cienfuegos  reports that she has never smoked. She has never used smokeless tobacco. She reports that she does not currently use alcohol. She reports that she does not use drugs. Medical:  has a past medical history of  Breast cancer ([place]) (08/05/2020), Carcinoma of upper-outer quadrant of right breast in female, estrogen receptor negative ([place]) (37/62/8315), Complication of anesthesia, H/O foot surgery (08/2018), History of kidney stones, Hypertension, and PONV (postoperative nausea and vomiting). Surgical: Ms. Mcnicholas  has a past surgical history that includes Kidney stone surgery; Cervical cone biopsy; Dilation and evacuation (N/A, 03/12/2018); laparoscopy (N/A, 06/07/2019); Laparoscopic ovarian cystectomy (Right, 06/07/2019); Breast biopsy (Right, 07/06/2020); Bunionectomy (Right); Total mastectomy (Right, 08/05/2020); Portacath placement (Left, 08/05/2020); Breast reconstruction with placement of tissue expander and flex hd (acellular hydrated dermis) (Right, 08/05/2020); Removal of tissue expander and placement of implant (Right, 03/09/2021); Mastopexy (Left, 03/09/2021); and Reconstruction breast immediate / delayed w/ tissue expander (Right, 03/09/2021). Family: family history includes Cancer in an other family member.  Laboratory Chemistry Profile   Renal Lab Results  Component Value Date   BUN 35 (H) 04/25/2021   CREATININE 0.96 04/25/2021   GFRAA >60 06/07/2019  GFRNONAA >60 04/25/2021     Hepatic Lab Results  Component Value Date   AST 22 04/25/2021   ALT 21 04/25/2021   ALBUMIN 4.4 04/25/2021   ALKPHOS 64 04/25/2021     Electrolytes Lab Results  Component Value Date   NA 136 04/25/2021   K 4.6 04/25/2021   CL 102 04/25/2021   CALCIUM 9.6 04/25/2021     Bone No results found for: VD25OH, TM226JF3LKT, GY5638LH7, DS2876OT1, 25OHVITD1, 25OHVITD2, 25OHVITD3, TESTOFREE, TESTOSTERONE   Inflammation (CRP: Acute Phase) (ESR: Chronic Phase) Lab Results  Component Value Date   LATICACIDVEN 1.4 10/18/2020       Note: Above Lab results reviewed.  Recent Imaging Review  DG Chest 2 View CLINICAL DATA:  Breast cancer patient with fever today. Chills and weakness. Recent  mastectomy.  EXAM: CHEST - 2 VIEW  COMPARISON:  08/05/2020  FINDINGS: Hyperinflation of the lungs. Power port type left central venous catheter with tip over the low SVC region. No pneumothorax. Heart size and pulmonary vascularity are normal. Postoperative right mastectomy with breast reconstruction. Lungs are clear. No airspace disease, consolidation, or edema. No pleural effusions. No pneumothorax. Mediastinal contours appear intact.  IMPRESSION: No active cardiopulmonary disease.  Electronically Signed   By: Lucienne Capers M.D.   On: 10/18/2020 23:25 Note: Reviewed        Physical Exam  General appearance: Well nourished, well developed, and well hydrated. In no apparent acute distress Mental status: Alert, oriented x 3 (person, place, & time)       Respiratory: No evidence of acute respiratory distress Eyes: PERLA Vitals: BP 125/88   Pulse 98   Temp (!) 97.2 F (36.2 C)   Resp 16   Ht '5\' 2"'  (1.575 m)   Wt 102 lb (46.3 kg)   SpO2 100%   BMI 18.66 kg/m  BMI: Estimated body mass index is 18.66 kg/m as calculated from the following:   Height as of this encounter: '5\' 2"'  (1.575 m).   Weight as of this encounter: 102 lb (46.3 kg). Ideal: Ideal body weight: 50.1 kg (110 lb 7.2 oz)  Neurogenic pain pattern of lower extremity 5 out of 5 strength bilateral lower extremity: Plantar flexion, dorsiflexion, knee flexion, knee extension.   Assessment   Diagnosis  1. Small fiber neuropathy   2. Chronic pain syndrome   3. Idiopathic progressive neuropathy         Plan of Care  Ms. Kecia Swoboda has a current medication list which includes the following long-term medication(s): amphetamine-dextroamphetamine, lisinopril, and pregabalin.  Pharmacotherapy (Medications Ordered): Meds ordered this encounter  Medications   traMADol (ULTRAM-ER) 200 MG 24 hr tablet    Sig: Take 1 tablet (200 mg total) by mouth daily.    Dispense:  30 tablet    Refill:  2    For  chronic pain syndrome   traMADol (ULTRAM) 50 MG tablet    Sig: Take 1 tablet (50 mg total) by mouth every 12 (twelve) hours as needed for severe pain.    Dispense:  60 tablet    Refill:  2    To last for 30 days from fill date   Capsaicin 0.15 % LIQD    Sig: Please apply small amount to affected area twice daily as needed    Dispense:  40 mL    Refill:  1     Orders:  Orders Placed This Encounter  Procedures   Radiofrequency ablation, other    Standing Status:   Future  Standing Expiration Date:   11/12/2021    Scheduling Instructions:     Bilateral Qutenza application    Order Specific Question:   Where will this procedure be performed?    Answer:   [facility]     Follow-up plan:   Return in about 3 months (around 08/12/2021) for Medication Management, in person.   Recent Visits Date Type Provider Dept  05/04/21 Procedure visit Gillis Santa, MD [facility]  04/13/21 Telemedicine Gillis Santa, MD [facility]  03/30/21 Procedure visit Gillis Santa, MD [facility]  03/22/21 Office Visit Gillis Santa, MD [facility]  02/16/21 Procedure visit Gillis Santa, MD [facility]  Showing recent visits within past 90 days and meeting all other requirements Today's Visits Date Type Provider Dept  05/12/21 Office Visit Gillis Santa, MD [facility]  Showing today's visits and meeting all other requirements Future Appointments Date Type Provider Dept  08/09/21 Appointment Gillis Santa, MD [facility]  Showing future appointments within next 90 days and meeting all other requirements I discussed the assessment and treatment plan with the patient. The patient was provided an opportunity to ask questions and all were answered. The patient agreed with the plan and demonstrated an understanding of the instructions.  Patient advised to call back or seek an in-person evaluation if the symptoms or condition  worsens.  Duration of encounter: 30 minutes.  Note by: Gillis Santa, MD Date: 05/12/2021; Time: 3:06 PM

## 2021-05-12 NOTE — Progress Notes (Signed)
Nursing Pain Medication Assessment:  Safety precautions to be maintained throughout the outpatient stay will include: orient to surroundings, keep bed in low position, maintain call bell within reach at all times, provide assistance with transfer out of bed and ambulation.  Medication Inspection Compliance: [name] did not comply with our request to bring her pills to be counted. She was reminded that bringing the medication bottles, even when empty, is a requirement.  Medication: None brought in. Pill/Patch Count: None available to be counted. Bottle Appearance: No container available. Did not bring bottle(s) to appointment. Filled Date: N/A Last Medication intake:  Today

## 2021-06-14 ENCOUNTER — Encounter: Payer: Self-pay | Admitting: Plastic Surgery

## 2021-06-14 ENCOUNTER — Ambulatory Visit (INDEPENDENT_AMBULATORY_CARE_PROVIDER_SITE_OTHER): Payer: BC Managed Care – PPO | Admitting: Plastic Surgery

## 2021-06-14 ENCOUNTER — Other Ambulatory Visit: Payer: Self-pay

## 2021-06-14 DIAGNOSIS — C50411 Malignant neoplasm of upper-outer quadrant of right female breast: Secondary | ICD-10-CM

## 2021-06-14 DIAGNOSIS — Z9011 Acquired absence of right breast and nipple: Secondary | ICD-10-CM

## 2021-06-14 DIAGNOSIS — Z171 Estrogen receptor negative status [ER-]: Secondary | ICD-10-CM

## 2021-06-14 NOTE — Progress Notes (Signed)
   Subjective:    Patient ID: Beth Wells, female    DOB: Apr 22, 1976, 45 y.o.   MRN: [id number]  The patient is a 45 year old female here for follow-up on her breast surgery.  In June she underwent exchange of her expander for an implant on the right and a breast lift on the left.  Her right breast implant is a Mentor smooth round high-profile gel 275 cc implant.  The implant has settled a little but still has a defined line between the pectoralis major muscle and the ADM. She also has some hollowing on the upper medial aspect of the right breast.   Review of Systems  Constitutional: Negative.   Eyes: Negative.   Respiratory: Negative.    Cardiovascular: Negative.   Gastrointestinal: Negative.   Endocrine: Negative.   Genitourinary: Negative.   Musculoskeletal: Negative.   Skin: Negative.   Hematological: Negative.   Psychiatric/Behavioral: Negative.        Objective:   Physical Exam Vitals and nursing note reviewed.  Constitutional:      Appearance: Normal appearance.  HENT:     Head: Normocephalic and atraumatic.  Cardiovascular:     Rate and Rhythm: Normal rate.     Pulses: Normal pulses.  Pulmonary:     Effort: Pulmonary effort is normal.  Musculoskeletal:        General: No swelling.  Skin:    General: Skin is warm.     Capillary Refill: Capillary refill takes less than 2 seconds.     Coloration: Skin is not jaundiced.     Findings: No bruising.  Neurological:     General: No focal deficit present.     Mental Status: She is alert.       Assessment & Plan:     ICD-10-CM   1. Carcinoma of upper-outer quadrant of right breast in female, estrogen receptor negative ([place])  C50.411    Z17.1     2. Acquired absence of right breast  Z90.11       Pictures were obtained of the patient and placed in the chart with the patient's or guardian's permission.  Plan for follow up in 3 months and we can plan on lipofilling to right breast.

## 2021-07-27 ENCOUNTER — Other Ambulatory Visit: Payer: Self-pay | Admitting: General Surgery

## 2021-07-27 DIAGNOSIS — Z853 Personal history of malignant neoplasm of breast: Secondary | ICD-10-CM

## 2021-07-27 DIAGNOSIS — Z1231 Encounter for screening mammogram for malignant neoplasm of breast: Secondary | ICD-10-CM

## 2021-08-09 ENCOUNTER — Other Ambulatory Visit: Payer: Self-pay

## 2021-08-09 ENCOUNTER — Encounter: Payer: Self-pay | Admitting: Student in an Organized Health Care Education/Training Program

## 2021-08-09 ENCOUNTER — Ambulatory Visit
Payer: BC Managed Care – PPO | Attending: Student in an Organized Health Care Education/Training Program | Admitting: Student in an Organized Health Care Education/Training Program

## 2021-08-09 VITALS — BP 104/72 | HR 86 | Temp 96.6°F | Resp 16 | Ht 62.0 in | Wt 102.0 lb

## 2021-08-09 DIAGNOSIS — Z171 Estrogen receptor negative status [ER-]: Secondary | ICD-10-CM | POA: Diagnosis present

## 2021-08-09 DIAGNOSIS — G603 Idiopathic progressive neuropathy: Secondary | ICD-10-CM | POA: Diagnosis present

## 2021-08-09 DIAGNOSIS — C50411 Malignant neoplasm of upper-outer quadrant of right female breast: Secondary | ICD-10-CM | POA: Diagnosis present

## 2021-08-09 DIAGNOSIS — G894 Chronic pain syndrome: Secondary | ICD-10-CM | POA: Insufficient documentation

## 2021-08-09 DIAGNOSIS — G608 Other hereditary and idiopathic neuropathies: Secondary | ICD-10-CM | POA: Insufficient documentation

## 2021-08-09 DIAGNOSIS — R202 Paresthesia of skin: Secondary | ICD-10-CM | POA: Insufficient documentation

## 2021-08-09 DIAGNOSIS — G629 Polyneuropathy, unspecified: Secondary | ICD-10-CM | POA: Insufficient documentation

## 2021-08-09 MED ORDER — PREGABALIN 100 MG PO CAPS: 100.0000 mg | ORAL_CAPSULE | Freq: Two times a day (BID) | ORAL | 1 refills | Status: DC

## 2021-08-09 MED ORDER — TRAMADOL HCL 50 MG PO TABS: 50.0000 mg | ORAL_TABLET | Freq: Two times a day (BID) | ORAL | 2 refills | Status: DC | PRN

## 2021-08-09 MED ORDER — TRAMADOL HCL ER 200 MG PO TB24: 200.0000 mg | ORAL_TABLET | Freq: Every day | ORAL | 2 refills | Status: DC

## 2021-08-09 NOTE — Progress Notes (Signed)
Nursing Pain Medication Assessment:  Safety precautions to be maintained throughout the outpatient stay will include: orient to surroundings, keep bed in low position, maintain call bell within reach at all times, provide assistance with transfer out of bed and ambulation.  Medication Inspection Compliance: Beth Wells did not comply with our request to bring her pills to be counted. She was reminded that bringing the medication bottles, even when empty, is a requirement.  Medication: None brought in. Pill/Patch Count: None available to be counted. Bottle Appearance: No container available. Did not bring bottle(s) to appointment. Filled Date: N/A Last Medication intake:  Today

## 2021-08-09 NOTE — Progress Notes (Signed)
PROVIDER NOTE: Information contained herein reflects review and annotations entered in association with encounter. Interpretation of such information and data should be left to medically-trained personnel. Information provided to patient can be located elsewhere in the medical record under "Patient Instructions". Document created using STT-dictation technology, any transcriptional errors that may result from process are unintentional.    Patient: Beth Wells  Service Category: E/M  Provider: Gillis Santa, MD  DOB: 1976/04/20  DOS: 08/09/2021  Specialty: Interventional Pain Management  MRN: [id number]  Setting: Ambulatory outpatient  PCP: Elza Rafter, MD  Type: Established Patient    Referring Provider: Elza Rafter, *  Location: Office  Delivery: Face-to-face     HPI  Beth Wells, a 45 y.o. year old female, is here today because of her Chronic pain syndrome [G89.4]. [name] primary complain today is Leg Pain (Inner thighs bilat) and Foot Pain (bilat) Last encounter: My last encounter with her was on 05/12/21 Pertinent problems: [name] has Small fiber neuropathy; Paresthesias; and Chronic pain syndrome on their pertinent problem list. Pain Assessment: Severity of Chronic pain is reported as a 5 /10. Location: Leg Right, Left (inner thighs; AND feet bilat)/ . Onset: More than a month ago. Quality: Burning. Timing: Constant. Modifying factor(s): meds and topicals are used regularly. Vitals:  height is _0  (1.575 m) and weight is 102 lb (46.3 kg). Her temporal temperature is 96.6 F (35.9 C) (abnormal). Her blood pressure is 104/72 and her pulse is 86. Her respiration is 16 and oxygen saturation is 100%.   Reason for encounter: medication management.    No change in medical history since last visit.  Patient's pain is at baseline.  Patient continues multimodal pain regimen as prescribed.  States that it provides pain relief and improvement in functional status. Is  undergoing treatment with chiropractor for neuropathy which she thinks is helping (doing infared and water therapy). Have discussed Qutenza treatment with her which we'll discuss further at her next follow up    Pharmacotherapy Assessment  Analgesic: tramadol 100 mg ER, continue tramadol IR 50 mg twice daily for breakthrough pain    Monitoring:  PMP: PDMP reviewed during this encounter.       Pharmacotherapy: No side-effects or adverse reactions reported. Compliance: No problems identified. Effectiveness: Clinically acceptable.  UDS:  Summary  Date Value Ref Range Status  03/22/2021 Note  Final    Comment:    ==================================================================== [employer] 13 (MW) ==================================================================== Test                             Result       Flag       Units  Drug Present and Declared for Prescription Verification   Amphetamine                    >5263        EXPECTED   ng/mg creat    Amphetamine is available as a schedule II prescription drug.    Tramadol                       >2632        EXPECTED   ng/mg creat   O-Desmethyltramadol            >2632        EXPECTED   ng/mg creat   N-Desmethyltramadol            [phone]  EXPECTED   ng/mg creat    Source of tramadol is a prescription medication. O-desmethyltramadol    and N-desmethyltramadol are expected metabolites of tramadol.  ==================================================================== Test                      Result    Flag   Units      Ref Range   Creatinine              190              mg/dL      >=20 ==================================================================== Declared Medications:  The flagging and interpretation on this report are based on the  following declared medications.  Unexpected results may arise from  inaccuracies in the declared medications.   **Note: The testing scope of this panel includes these  medications:   Amphetamine (Adderall)  Tramadol (Ultram)   **Note: The testing scope of this panel does not include the  following reported medications:   Lisinopril (Zestril)  Pregabalin (Lyrica)  Trolamine Salicylate (topical) (Aspercreme) ==================================================================== For clinical consultation, please call [phone]. ====================================================================      ROS  Constitutional: Denies any fever or chills Gastrointestinal: No reported hemesis, hematochezia, vomiting, or acute GI distress Musculoskeletal: Denies any acute onset joint swelling, redness, loss of ROM, or weakness Neurological:  Lower extremity neuropathic pain  Medication Review  Alpha-Lipoic Acid, Capsaicin, Probiotic Product, amphetamine-dextroamphetamine, capsicum, lisinopril, pregabalin, traMADol, and trolamine salicylate  History Review  Allergy: Ms. Solan has No Known Allergies. Drug: Ms. Pamintuan  reports no history of drug use. Alcohol:  reports that she does not currently use alcohol. Tobacco:  reports that she has never smoked. She has never used smokeless tobacco. Social: Ms. Circle  reports that she has never smoked. She has never used smokeless tobacco. She reports that she does not currently use alcohol. She reports that she does not use drugs. Medical:  has a past medical history of Breast cancer ([place]) (08/05/2020), Carcinoma of upper-outer quadrant of right breast in female, estrogen receptor negative ([place]) (21/97/5883), Complication of anesthesia, H/O foot surgery (08/2018), History of kidney stones, Hypertension, and PONV (postoperative nausea and vomiting). Surgical: Ms. Boullion  has a past surgical history that includes Kidney stone surgery; Cervical cone biopsy; Dilation and evacuation (N/A, 03/12/2018); laparoscopy (N/A, 06/07/2019); Laparoscopic ovarian cystectomy (Right, 06/07/2019); Breast biopsy (Right, 07/06/2020);  Bunionectomy (Right); Total mastectomy (Right, 08/05/2020); Portacath placement (Left, 08/05/2020); Breast reconstruction with placement of tissue expander and flex hd (acellular hydrated dermis) (Right, 08/05/2020); Removal of tissue expander and placement of implant (Right, 03/09/2021); Mastopexy (Left, 03/09/2021); and Reconstruction breast immediate / delayed w/ tissue expander (Right, 03/09/2021). Family: family history includes Cancer in an other family member.  Laboratory Chemistry Profile   Renal Lab Results  Component Value Date   BUN 35 (H) 04/25/2021   CREATININE 0.96 04/25/2021   GFRAA >60 06/07/2019   GFRNONAA >60 04/25/2021     Hepatic Lab Results  Component Value Date   AST 22 04/25/2021   ALT 21 04/25/2021   ALBUMIN 4.4 04/25/2021   ALKPHOS 64 04/25/2021     Electrolytes Lab Results  Component Value Date   NA 136 04/25/2021   K 4.6 04/25/2021   CL 102 04/25/2021   CALCIUM 9.6 04/25/2021     Bone No results found for: VD25OH, VD125OH2TOT, GP4982ME1, RA3094MH6, 25OHVITD1, 25OHVITD2, 25OHVITD3, TESTOFREE, TESTOSTERONE   Inflammation (CRP: Acute Phase) (ESR: Chronic Phase) Lab Results  Component Value Date   LATICACIDVEN 1.4 10/18/2020  Note: Above Lab results reviewed.  Recent Imaging Review  DG Chest 2 View CLINICAL DATA:  Breast cancer patient with fever today. Chills and weakness. Recent mastectomy.  EXAM: CHEST - 2 VIEW  COMPARISON:  08/05/2020  FINDINGS: Hyperinflation of the lungs. Power port type left central venous catheter with tip over the low SVC region. No pneumothorax. Heart size and pulmonary vascularity are normal. Postoperative right mastectomy with breast reconstruction. Lungs are clear. No airspace disease, consolidation, or edema. No pleural effusions. No pneumothorax. Mediastinal contours appear intact.  IMPRESSION: No active cardiopulmonary disease.  Electronically Signed   By: Lucienne Capers M.D.   On:  10/18/2020 23:25 Note: Reviewed        Physical Exam  General appearance: Well nourished, well developed, and well hydrated. In no apparent acute distress Mental status: Alert, oriented x 3 (person, place, & time)       Respiratory: No evidence of acute respiratory distress Eyes: PERLA Vitals: BP 104/72 (BP Location: Left Arm, Patient Position: Sitting, Cuff Size: Normal)   Pulse 86   Temp (!) 96.6 F (35.9 C) (Temporal)   Resp 16   Ht $R'5\' 2"'mN$  (1.575 m)   Wt 102 lb (46.3 kg)   SpO2 100%   BMI 18.66 kg/m  BMI: Estimated body mass index is 18.66 kg/m as calculated from the following:   Height as of this encounter: $RemoveBeforeD'5\' 2"'aKeZnNQHPOsSgJ$  (1.575 m).   Weight as of this encounter: 102 lb (46.3 kg). Ideal: Ideal body weight: 50.1 kg (110 lb 7.2 oz)  Neurogenic pain pattern of lower extremity 5 out of 5 strength bilateral lower extremity: Plantar flexion, dorsiflexion, knee flexion, knee extension.   Assessment   Diagnosis  1. Chronic pain syndrome   2. Small fiber neuropathy   3. Idiopathic progressive neuropathy   4. Idiopathic small fiber sensory neuropathy   5. Carcinoma of upper-outer quadrant of right breast in female, estrogen receptor negative ([place])   6. Paresthesias         Plan of Care  Ms. Zeina Akkerman has a current medication list which includes the following long-term medication(s): amphetamine-dextroamphetamine, lisinopril, and pregabalin.  Pharmacotherapy (Medications Ordered): Meds ordered this encounter  Medications   traMADol (ULTRAM) 50 MG tablet    Sig: Take 1 tablet (50 mg total) by mouth every 12 (twelve) hours as needed for severe pain.    Dispense:  60 tablet    Refill:  2    To last for 30 days from fill date   traMADol (ULTRAM-ER) 200 MG 24 hr tablet    Sig: Take 1 tablet (200 mg total) by mouth daily.    Dispense:  30 tablet    Refill:  2    For chronic pain syndrome   pregabalin (LYRICA) 100 MG capsule    Sig: Take 1 capsule (100 mg total) by mouth 2  (two) times daily.    Dispense:  180 capsule    Refill:  1    Fill one day early if pharmacy is closed on scheduled refill date. May substitute for generic if available.   Continue with capsaicin cream Consider Qutenza in future   Orders:  No orders of the defined types were placed in this encounter.    Follow-up plan:   Return in about 3 months (around 11/17/2021) for Medication Management, in person.   Recent Visits Date Type Provider Dept  05/12/21 Office Visit Gillis Santa, MD [facility]  Showing recent visits within past 90 days and meeting  all other requirements Today's Visits Date Type Provider Dept  08/09/21 Office Visit Gillis Santa, MD [facility]  Showing today's visits and meeting all other requirements Future Appointments No visits were found meeting these conditions. Showing future appointments within next 90 days and meeting all other requirements I discussed the assessment and treatment plan with the patient. The patient was provided an opportunity to ask questions and all were answered. The patient agreed with the plan and demonstrated an understanding of the instructions.  Patient advised to call back or seek an in-person evaluation if the symptoms or condition worsens.  Duration of encounter: 30 minutes.  Note by: Gillis Santa, MD Date: 08/09/2021; Time: 3:29 PM

## 2021-08-24 ENCOUNTER — Telehealth: Payer: Self-pay

## 2021-08-24 NOTE — Telephone Encounter (Signed)
Patient would like to know if it is ok to get a mammogram on 08/30/2021. She had breast reconstruction surgery on 03/09/2021.

## 2021-08-24 NOTE — Telephone Encounter (Signed)
Returned patients call. Removal of right expander with implant was performed on 03/09/2021 with Dr. Marla Roe. She is 9 days short from 6 months. Advised she may have MMG 6 months from surgery. Patient would like to keep her appointment 08/30/2021.

## 2021-08-25 NOTE — Telephone Encounter (Signed)
Ideally she would wait 12 months from time of implant placement.  If she could push an additional 3-6 months, that would be preferred.

## 2021-08-26 NOTE — Telephone Encounter (Signed)
Dr. Ferrel Logan order MMG

## 2021-08-29 ENCOUNTER — Inpatient Hospital Stay: Payer: BC Managed Care – PPO | Attending: Internal Medicine

## 2021-08-29 ENCOUNTER — Inpatient Hospital Stay (HOSPITAL_BASED_OUTPATIENT_CLINIC_OR_DEPARTMENT_OTHER): Payer: BC Managed Care – PPO | Admitting: Internal Medicine

## 2021-08-29 ENCOUNTER — Encounter: Payer: Self-pay | Admitting: Internal Medicine

## 2021-08-29 ENCOUNTER — Other Ambulatory Visit: Payer: Self-pay

## 2021-08-29 DIAGNOSIS — C50411 Malignant neoplasm of upper-outer quadrant of right female breast: Secondary | ICD-10-CM

## 2021-08-29 DIAGNOSIS — I10 Essential (primary) hypertension: Secondary | ICD-10-CM | POA: Insufficient documentation

## 2021-08-29 DIAGNOSIS — G629 Polyneuropathy, unspecified: Secondary | ICD-10-CM | POA: Insufficient documentation

## 2021-08-29 DIAGNOSIS — Z800 Family history of malignant neoplasm of digestive organs: Secondary | ICD-10-CM | POA: Diagnosis not present

## 2021-08-29 DIAGNOSIS — Z171 Estrogen receptor negative status [ER-]: Secondary | ICD-10-CM

## 2021-08-29 LAB — CBC WITH DIFFERENTIAL/PLATELET
Abs Immature Granulocytes: 0.01 10*3/uL (ref 0.00–0.07)
Basophils Absolute: 0 10*3/uL (ref 0.0–0.1)
Basophils Relative: 1 %
Eosinophils Absolute: 0.1 10*3/uL (ref 0.0–0.5)
Eosinophils Relative: 3 %
HCT: 31.7 % — ABNORMAL LOW (ref 36.0–46.0)
Hemoglobin: 10.8 g/dL — ABNORMAL LOW (ref 12.0–15.0)
Immature Granulocytes: 0 %
Lymphocytes Relative: 31 %
Lymphs Abs: 1.3 10*3/uL (ref 0.7–4.0)
MCH: 31.9 pg (ref 26.0–34.0)
MCHC: 34.1 g/dL (ref 30.0–36.0)
MCV: 93.5 fL (ref 80.0–100.0)
Monocytes Absolute: 0.3 10*3/uL (ref 0.1–1.0)
Monocytes Relative: 6 %
Neutro Abs: 2.5 10*3/uL (ref 1.7–7.7)
Neutrophils Relative %: 59 %
Platelets: 202 10*3/uL (ref 150–400)
RBC: 3.39 MIL/uL — ABNORMAL LOW (ref 3.87–5.11)
RDW: 12.1 % (ref 11.5–15.5)
WBC: 4.2 10*3/uL (ref 4.0–10.5)
nRBC: 0 % (ref 0.0–0.2)

## 2021-08-29 LAB — COMPREHENSIVE METABOLIC PANEL
ALT: 12 U/L (ref 0–44)
AST: 19 U/L (ref 15–41)
Albumin: 4.1 g/dL (ref 3.5–5.0)
Alkaline Phosphatase: 72 U/L (ref 38–126)
Anion gap: 8 (ref 5–15)
BUN: 27 mg/dL — ABNORMAL HIGH (ref 6–20)
CO2: 25 mmol/L (ref 22–32)
Calcium: 8.5 mg/dL — ABNORMAL LOW (ref 8.9–10.3)
Chloride: 100 mmol/L (ref 98–111)
Creatinine, Ser: 0.68 mg/dL (ref 0.44–1.00)
GFR, Estimated: >60 mL/min (ref >60)
Glucose, Bld: 100 mg/dL — ABNORMAL HIGH (ref 70–99)
Potassium: 3.7 mmol/L (ref 3.5–5.1)
Sodium: 133 mmol/L — ABNORMAL LOW (ref 135–145)
Total Bilirubin: 0.4 mg/dL (ref 0.3–1.2)
Total Protein: 6.8 g/dL (ref 6.5–8.1)

## 2021-08-29 NOTE — Assessment & Plan Note (Addendum)
#   Stage I triple negative breast cancer on adjuvant chemotherapy with s/p 4 cycles of AC; Taxol s/p 6 treatments. DISCONTINUED further Taxol chemotherapy given worsening neuropathy.  STABLE. Awaiting mammo- tomorrow/Dr.Cintron appt.   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  On Tramadol ER 200 mg; s/p lidocaine infusion [Dr.Lateef].STABLE.   # Low estrogen state-s/p Lupron [ovarian protection]-menstrual cycles resumed.  [Dr. Leafy Ro;     # BARD-1: S/p evaluation with Dr. Toy Baker 2022]-hold off prophylactic BSO.   # DISPOSITION:  # follow up as planned in 83 month MD; labs- cbc/cmp-Dr.B

## 2021-08-29 NOTE — Progress Notes (Signed)
Survivorship Care Plan visit completed.  Treatment summary reviewed and given to patient.  ASCO answers booklet reviewed and given to patient.  CARE program and Cancer Transitions discussed with patient along with other resources cancer center offers to patients and caregivers.  Patient verbalized understanding.   Patient is signing up for Cancer Transition and appointment made for Lymphedema screening with [facility].

## 2021-08-29 NOTE — Progress Notes (Signed)
Patient reports that neuropathy seems to have gotten worse in the past 2-3 weeks.

## 2021-08-29 NOTE — Progress Notes (Signed)
[facility] NOTE  Patient Care Team: Beth Wells, Beth Boozer, Wells as PCP - General (Family Medicine) Beth Sickle, Wells as Medical Oncologist (Oncology) Beth Santa, Wells as Consulting Physician (Pain Medicine) Beth Pun, Wells as Consulting Physician (General Surgery) Beth Wells, Beth Lofty, Wells as Attending Physician (Plastic Surgery)  CHIEF COMPLAINTS/PURPOSE OF CONSULTATION: Breast cancer  #  Oncology History Overview Note  # OCT 2021-RIGHT BREAST- TRIPLE NEGATIVE [place]; [US/mammo-38m]; Dr.Cintron; OCT 2021-USThere is a 7 mm mass in the right breast at 10 o'clock, favored to represent a complicated cyst;  No evidence of right axillary lymphadenopathy].   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 [Dr.Shah]- skin biopsy- on cymblata+ Lyrica  # DEC 2021-status post evaluation with reproductive endocrinology [9-10% chance of viable pregnancy] # # SURVIVORSHIP:   # GENETICS: BARD  DIAGNOSIS: Breast cancer-triple negative  STAGE:  I       ;  GOALS: cure  CURRENT/MOST RECENT THERAPY : AC-T    Carcinoma of upper-outer quadrant of right breast in [place], estrogen receptor negative [place] 07/13/2020 Initial Diagnosis   Carcinoma of upper-outer quadrant of right breast in female, estrogen receptor negative ([place]   Genetic Testing   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 The report date is 08/02/2020.  The Multi-Cancer Panel offered by [facility] includes sequencing and/or deletion duplication testing of the following 85 genes: AIP, ALK, APC, ATM, AXIN2,BAP1,  BARD1, BLM, BMPR1A, BRCA1, BRCA2, BRIP1, CASR, CDC73, CDH1, CDK4, CDKN1B, CDKN1C, CDKN2A (p14ARF), CDKN2A (p16INK4a), CEBPA, CHEK2, CTNNA1, DICER1, DIS3L2, EGFR (c.2369C>T, p.Thr790Met variant only), EPCAM  (Deletion/duplication testing only), FH, FLCN, GATA2, GPC3, GREM1 (Promoter region deletion/duplication testing only), HOXB13 (c.251G>A, p.Gly84Glu), HRAS, KIT, MAX, MEN1, MET, MITF (c.952G>A, p.Glu318Lys variant only), MLH1, MSH2, MSH3, MSH6, MUTYH, NBN, NF1, NF2, NTHL1, PALB2, PDGFRA, PHOX2B, PMS2, POLD1, POLE, POT1, PRKAR1A, PTCH1, PTEN, RAD50, RAD51C, RAD51D, RB1, RECQL4, RET, RNF43, RUNX1, SDHAF2, SDHA (sequence changes only), SDHB, SDHC, SDHD, SMAD4, SMARCA4, SMARCB1, SMARCE1, STK11, SUFU, TERC, TERT, TMEM127, TP53, TSC1, TSC2, VHL, WRN and WT1.    09/10/2020 -  Chemotherapy    Patient is on Treatment Plan: BREAST ADJUVANT DOSE DENSE AC Q14D / PACLITAXEL Q7D          HISTORY OF PRESENTING ILLNESS:  Beth Sarwar89.o.  female   with stage I triple negative breast cancer Wells/p adjuvant chemotherapy is here for follow-up.  In the interim patient underwent explantation of her port.  Patient continues to complain of tingling and numbness in upper extremities/burning pain.  She has been evaluated by complementary medicine.    Review of Systems  Constitutional:  Positive for malaise/fatigue. Negative for chills, diaphoresis, fever and weight loss.  HENT:  Negative for nosebleeds and sore throat.   Eyes:  Negative for double vision.  Respiratory:  Negative for cough, hemoptysis, sputum production, shortness of breath and wheezing.   Cardiovascular:  Negative for chest pain, palpitations, orthopnea and leg swelling.  Gastrointestinal:  Negative for abdominal pain, blood in stool, diarrhea, heartburn, melena and vomiting.  Genitourinary:  Negative for dysuria, frequency and urgency.  Musculoskeletal:  Positive for joint pain and myalgias. Negative for back pain.  Skin:  Positive for itching and rash.  Neurological:  Positive for tingling. Negative for dizziness, focal weakness, weakness and headaches.  Endo/Heme/Allergies:  Does not  bruise/bleed easily.  Psychiatric/Behavioral:  Negative  for depression. The patient is not nervous/anxious and does not have insomnia.     MEDICAL HISTORY:  Past Medical History:  Diagnosis Date   Breast cancer (HCC) 08/05/2020   Carcinoma of upper-outer quadrant of right breast in female, estrogen receptor negative (HCC) 07/13/2020   Complication of anesthesia    hard to wake up-very dizzy feeling like she was going to pass out   H/O foot surgery 08/2018   History of kidney stones    h/o   Hypertension    PONV (postoperative nausea and vomiting)    n/v aftter kidney stone surgery    SURGICAL HISTORY: Past Surgical History:  Procedure Laterality Date   BREAST BIOPSY Right 07/06/2020   u/Wells bx Q clip path pending   BREAST RECONSTRUCTION WITH PLACEMENT OF TISSUE EXPANDER AND FLEX HD (ACELLULAR HYDRATED DERMIS) Right 08/05/2020   Procedure: RIGHT BREAST RECONSTRUCTION WITH PLACEMENT OF TISSUE EXPANDER AND FLEX HD (ACELLULAR HYDRATED DERMIS);  Surgeon: Beth Wells, Beth Wells;  Location: [facility];  Service: Plastics;  Laterality: Right;   BUNIONECTOMY Right    titanium pin   CERVICAL CONE BIOPSY     DILATION AND EVACUATION N/A 03/12/2018   Procedure: DILATATION AND EVACUATION;  Surgeon: Beasley, Beth Wells;  Location: [facility];  Service: Gynecology;  Laterality: N/A;   KIDNEY STONE SURGERY     LAPAROSCOPIC OVARIAN CYSTECTOMY Right 06/07/2019   Procedure: LAPAROSCOPIC OVARIAN CYSTECTOMY;  Surgeon: Beasley, Beth Wells;  Location: [facility];  Service: Gynecology;  Laterality: Right;   LAPAROSCOPY N/A 06/07/2019   Procedure: LAPAROSCOPY DIAGNOSTIC,PERITANEAL BIOPSIES;  Surgeon: Beasley, Beth Wells;  Location: [facility];  Service: Gynecology;  Laterality: N/A;   MASTOPEXY Left 03/09/2021   Procedure: MASTOPEXY LEFT BREAST;  Surgeon: Beth Wells, Beth Wells;  Location: [facility];  Service: Plastics;  Laterality: Left;   PORTACATH PLACEMENT Left 08/05/2020   Procedure: INSERTION PORT-A-CATH;  Surgeon: Beth Wells, Beth Wells;   Location: [facility];  Service: General;  Laterality: Left;   RECONSTRUCTION BREAST IMMEDIATE / DELAYED W/ TISSUE EXPANDER Right 03/09/2021   REMOVAL OF TISSUE EXPANDER AND PLACEMENT OF IMPLANT Right 03/09/2021   Procedure: Removal of right breast expander with placement of implant;  Surgeon: Beth Wells, Beth Wells;  Location: [facility];  Service: Plastics;  Laterality: Right;  2.5 hours   TOTAL MASTECTOMY Right 08/05/2020   Procedure: TOTAL MASTECTOMY w/ Sentinel Node;  Surgeon: Beth Wells, Beth Wells;  Location: [facility];  Service: General;  Laterality: Right;    SOCIAL HISTORY: Social History   Socioeconomic History   Marital status: Married    Spouse name: Doug   Number of children: 0   Years of education: Not on file   Highest education level: Not on file  Occupational History   Occupation: Speech Language Pathologist    Employer: [employer]  Tobacco Use   Smoking status: Never   Smokeless tobacco: Never  Vaping Use   Vaping Use: Never used  Substance and Sexual Activity   Alcohol use: Not Currently   Drug use: Never   Sexual activity: Yes    Birth control/protection: Condom  Other Topics Concern   Not on file  Social History Narrative   Speech language pathologist/[employer]; never smoked; rare alcohol. No children. Lives in [place].    Social Determinants of Health   Financial Resource Strain: Low Risk    Difficulty of Paying Living Expenses: Not hard at all  Food Insecurity:   Not on file  Transportation Needs: No Transportation Needs   Lack of Transportation (Medical): No   Lack of Transportation (Non-Medical): No  Physical Activity: Not on file  Stress: Stress Concern Present   Feeling of Stress : Very much  Social Connections: Not on file  Intimate Partner Violence: Not on file    FAMILY HISTORY: Family History  Problem Relation Age of Onset   Cancer Other        great aunt- GI cancer   Breast cancer Neg Hx     ALLERGIES:   has No Known Allergies.  MEDICATIONS:  Current Outpatient Medications  Medication Sig Dispense Refill   ALPHA-LIPOIC ACID PO Take 500 mg by mouth daily.      amphetamine-dextroamphetamine (ADDERALL XR) 30 MG 24 hr capsule Take 30 mg by mouth daily.      Capsaicin 0.15 % LIQD Please apply small amount to affected area twice daily as needed 40 mL 1   capsicum (ZOSTRIX) 0.075 % topical cream Apply 1 application topically 2 (two) times daily.     estradiol (ESTRACE) 0.1 MG/GM vaginal cream Place vaginally once a week.     lisinopril (ZESTRIL) 5 MG tablet Take 5 mg by mouth every evening.     pregabalin (LYRICA) 100 MG capsule Take 1 capsule (100 mg total) by mouth 2 (two) times daily. 180 capsule 1   Probiotic Product (PROBIOTIC ADVANCED PO) Take 1 capsule by mouth daily.      traMADol (ULTRAM) 50 MG tablet Take 1 tablet (50 mg total) by mouth every 12 (twelve) hours as needed for severe pain. 60 tablet 2   traMADol (ULTRAM-ER) 200 MG 24 hr tablet Take 1 tablet (200 mg total) by mouth daily. 30 tablet 2   traZODone (DESYREL) 50 MG tablet Take 50 mg by mouth at bedtime.     trolamine salicylate (ASPERCREME) 10 % cream Apply 1 application topically as needed for muscle pain.     No current facility-administered medications for this visit.      Marland Kitchen  PHYSICAL EXAMINATION: ECOG PERFORMANCE STATUS: 0 - Asymptomatic  Vitals:   08/29/21 1511  BP: 116/73  Pulse: 83  Resp: 16  Temp: 97.9 F (36.6 C)   Filed Weights   08/29/21 1511  Weight: 100 lb 12.8 oz (45.7 kg)    Physical Exam Constitutional:      Comments: Alone; ambulating independently.   HENT:     Head: Normocephalic and atraumatic.     Mouth/Throat:     Pharynx: No oropharyngeal exudate.  Eyes:     Pupils: Pupils are equal, round, and reactive to light.  Cardiovascular:     Rate and Rhythm: Normal rate and regular rhythm.  Pulmonary:     Effort: Pulmonary effort is normal. No respiratory distress.     Breath sounds:  Normal breath sounds. No wheezing.  Abdominal:     General: Bowel sounds are normal. There is no distension.     Palpations: Abdomen is soft. There is no mass.     Tenderness: There is no abdominal tenderness. There is no guarding or rebound.  Musculoskeletal:        General: No tenderness. Normal range of motion.     Cervical back: Normal range of motion and neck supple.  Skin:    General: Skin is warm.  Neurological:     Mental Status: She is alert and oriented to person, place, and time.  Psychiatric:        Mood and Affect:  Affect normal.     LABORATORY DATA:  I have reviewed the data as listed Lab Results  Component Value Date   WBC 4.2 08/29/2021   HGB 10.8 (L) 08/29/2021   HCT 31.7 (L) 08/29/2021   MCV 93.5 08/29/2021   PLT 202 08/29/2021   Recent Labs    01/24/21 0830 04/25/21 0956 08/29/21 1449  NA 138 136 133*  K 4.0 4.6 3.7  CL 102 102 100  CO2 26 26 25  GLUCOSE 79 92 100*  BUN 27* 35* 27*  CREATININE 0.79 0.96 0.68  CALCIUM 9.5 9.6 8.5*  GFRNONAA >60 >60 >60  PROT 7.1 7.0 6.8  ALBUMIN 4.0 4.4 4.1  AST 17 22 19  ALT 26 21 12  ALKPHOS 98 64 72  BILITOT 0.3 0.3 0.4    RADIOGRAPHIC STUDIES: I have personally reviewed the radiological images as listed and agreed with the findings in the report. No results found.  ASSESSMENT & PLAN:   Carcinoma of upper-outer quadrant of right breast in female, estrogen receptor negative (HCC) # Stage I triple negative breast cancer on adjuvant chemotherapy with Wells/p 4 cycles of AC; Taxol Wells/p 6 treatments. DISCONTINUED further Taxol chemotherapy given worsening neuropathy.  STABLE. Awaiting mammo- tomorrow/Dr.Cintron appt.   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  On Tramadol ER 200 mg; Wells/p lidocaine infusion [Dr.Lateef].STABLE.   # Low estrogen state-Wells/p Lupron [ovarian protection]-menstrual cycles resumed.  [Dr.  Beasley];     # BARD-1: Wells/p evaluation with Dr. Berchuck [may 2022]-hold off prophylactic BSO.   # DISPOSITION:  # follow up as planned in 6 month Wells; labs- cbc/cmp-Dr.B   All questions were answered. The patient/family knows to call the clinic with any problems, questions or concerns.    Govinda R Brahmanday, Wells 08/29/2021 8:00 PM    

## 2021-08-30 ENCOUNTER — Other Ambulatory Visit: Payer: Self-pay | Admitting: General Surgery

## 2021-08-30 ENCOUNTER — Ambulatory Visit
Admission: RE | Admit: 2021-08-30 | Discharge: 2021-08-30 | Disposition: A | Discharge status: Home / Self Care | Payer: BC Managed Care – PPO | Source: Ambulatory Visit | Attending: General Surgery | Admitting: General Surgery

## 2021-08-30 ENCOUNTER — Ambulatory Visit: Payer: BC Managed Care – PPO

## 2021-08-30 DIAGNOSIS — Z1231 Encounter for screening mammogram for malignant neoplasm of breast: Secondary | ICD-10-CM | POA: Diagnosis present

## 2021-08-30 DIAGNOSIS — Z853 Personal history of malignant neoplasm of breast: Secondary | ICD-10-CM

## 2021-09-13 ENCOUNTER — Ambulatory Visit: Payer: BC Managed Care – PPO | Admitting: Plastic Surgery

## 2021-09-13 ENCOUNTER — Other Ambulatory Visit: Payer: Self-pay

## 2021-09-13 ENCOUNTER — Encounter: Payer: Self-pay | Admitting: Plastic Surgery

## 2021-09-13 DIAGNOSIS — Z171 Estrogen receptor negative status [ER-]: Secondary | ICD-10-CM

## 2021-09-13 DIAGNOSIS — Q999 Chromosomal abnormality, unspecified: Secondary | ICD-10-CM | POA: Diagnosis not present

## 2021-09-13 DIAGNOSIS — C50411 Malignant neoplasm of upper-outer quadrant of right female breast: Secondary | ICD-10-CM | POA: Diagnosis not present

## 2021-09-13 DIAGNOSIS — Z9011 Acquired absence of right breast and nipple: Secondary | ICD-10-CM

## 2021-09-14 ENCOUNTER — Encounter: Payer: Self-pay | Admitting: Plastic Surgery

## 2021-09-14 NOTE — Progress Notes (Signed)
° °  Subjective:    Patient ID: Beth Wells, female    DOB: Jun 15, 1976, 45 y.o.   MRN: [id number]  The patient is a 45 yrs old female here for follow up on her breast reconstruction.  She had right breast cancer and underwent a mastectomy with implant based reconstruction.  The implant was placed in June 2022.  She had a left mastopexy as the same time. She had a high riding implant and it has improved over the past several months.  The breast is much softer today.  She finished Chemo in April and did not required any radiation.  The right implant is a Mentor smooth round high profile 275 cc gel implant. The right breast still has a definitive line at the juncture between the pectoralis muscle and the ADM.  She is not pleased with the look.     Review of Systems  Constitutional: Negative.   Eyes: Negative.   Respiratory: Negative.    Cardiovascular: Negative.  Negative for leg swelling.  Gastrointestinal: Negative.  Negative for abdominal pain.  Endocrine: Negative.   Genitourinary: Negative.   Musculoskeletal: Negative.   Hematological: Negative.   Psychiatric/Behavioral: Negative.        Objective:   Physical Exam Constitutional:      Appearance: Normal appearance.  Cardiovascular:     Rate and Rhythm: Normal rate.     Pulses: Normal pulses.  Pulmonary:     Effort: Pulmonary effort is normal. No respiratory distress.  Abdominal:     Palpations: Abdomen is soft. There is no mass.  Skin:    General: Skin is warm.     Capillary Refill: Capillary refill takes less than 2 seconds.     Coloration: Skin is not jaundiced.     Findings: No bruising or lesion.  Neurological:     Mental Status: She is alert and oriented to person, place, and time.  Psychiatric:        Mood and Affect: Mood normal.        Behavior: Behavior normal.        Thought Content: Thought content normal.      Assessment & Plan:     ICD-10-CM   1. Acquired absence of right breast  Z90.11     2. Carcinoma  of upper-outer quadrant of right breast in female, estrogen receptor negative ([place])  C50.411    Z17.1     3. 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  She would like to try to lower the NAC first.  The next step would be fat injection at the lower poll of the breast to correct the line.  Pictures were obtained of the patient and placed in the chart with the patient's or guardian's permission.

## 2021-10-05 ENCOUNTER — Inpatient Hospital Stay: Payer: BC Managed Care – PPO | Attending: Internal Medicine | Admitting: Occupational Therapy

## 2021-10-21 ENCOUNTER — Encounter: Payer: Self-pay | Admitting: Internal Medicine

## 2021-10-23 IMAGING — MG MM BREAST LOCALIZATION CLIP
4 series · 4 of 12 positions shown · non-contrast
Comparison: Previous exam(s).

CLINICAL DATA: 44-year-old female status post ultrasound-guided
biopsy of the right breast.

EXAM:
DIAGNOSTIC RIGHT MAMMOGRAM POST ULTRASOUND BIOPSY

[R ML synth-2D]
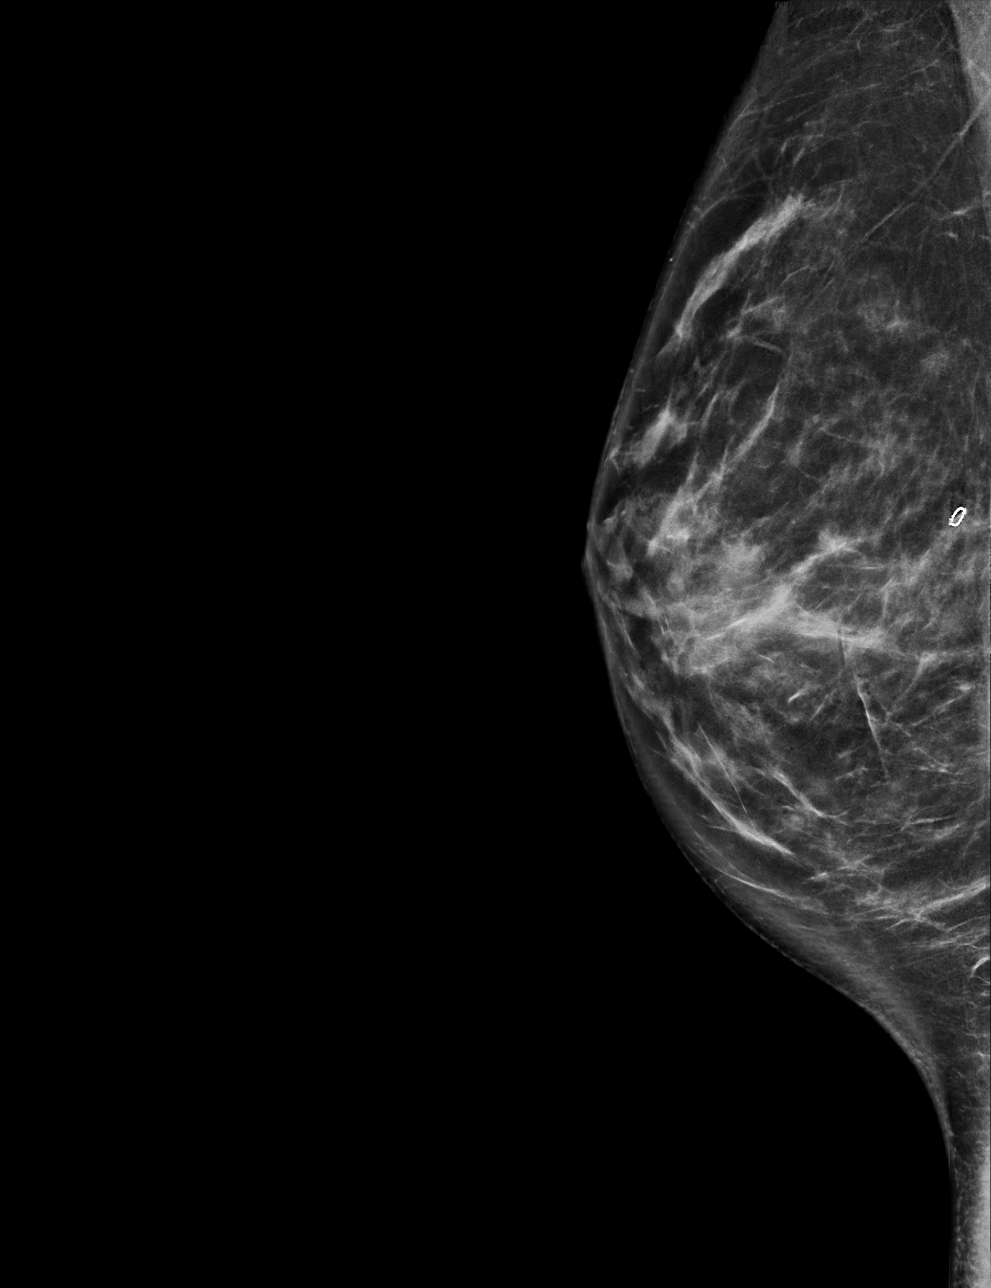

[R CC synth-2D]
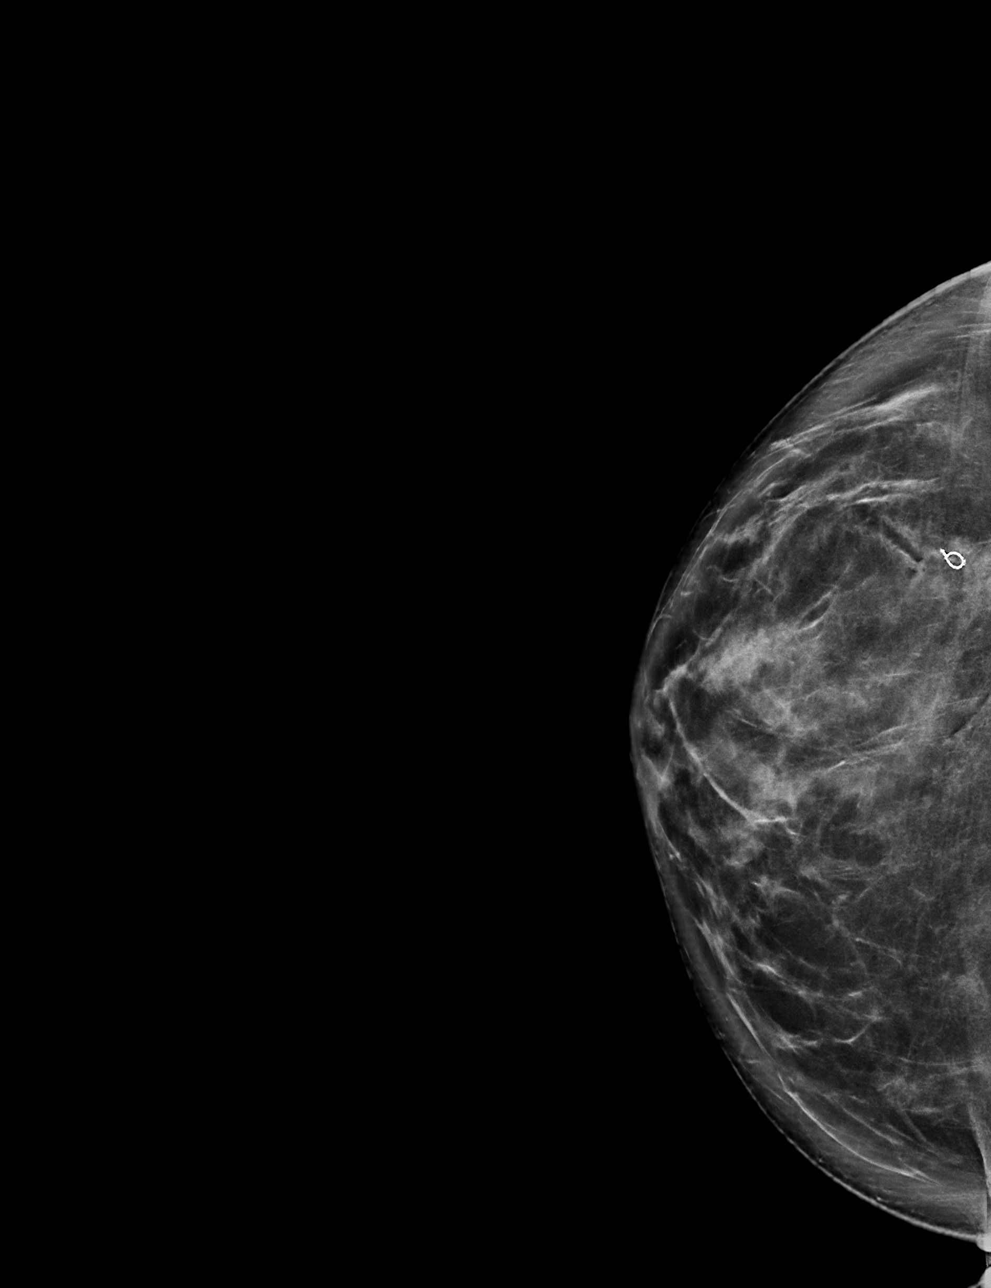

[R CC tomo · tomo slice 43/84.0]
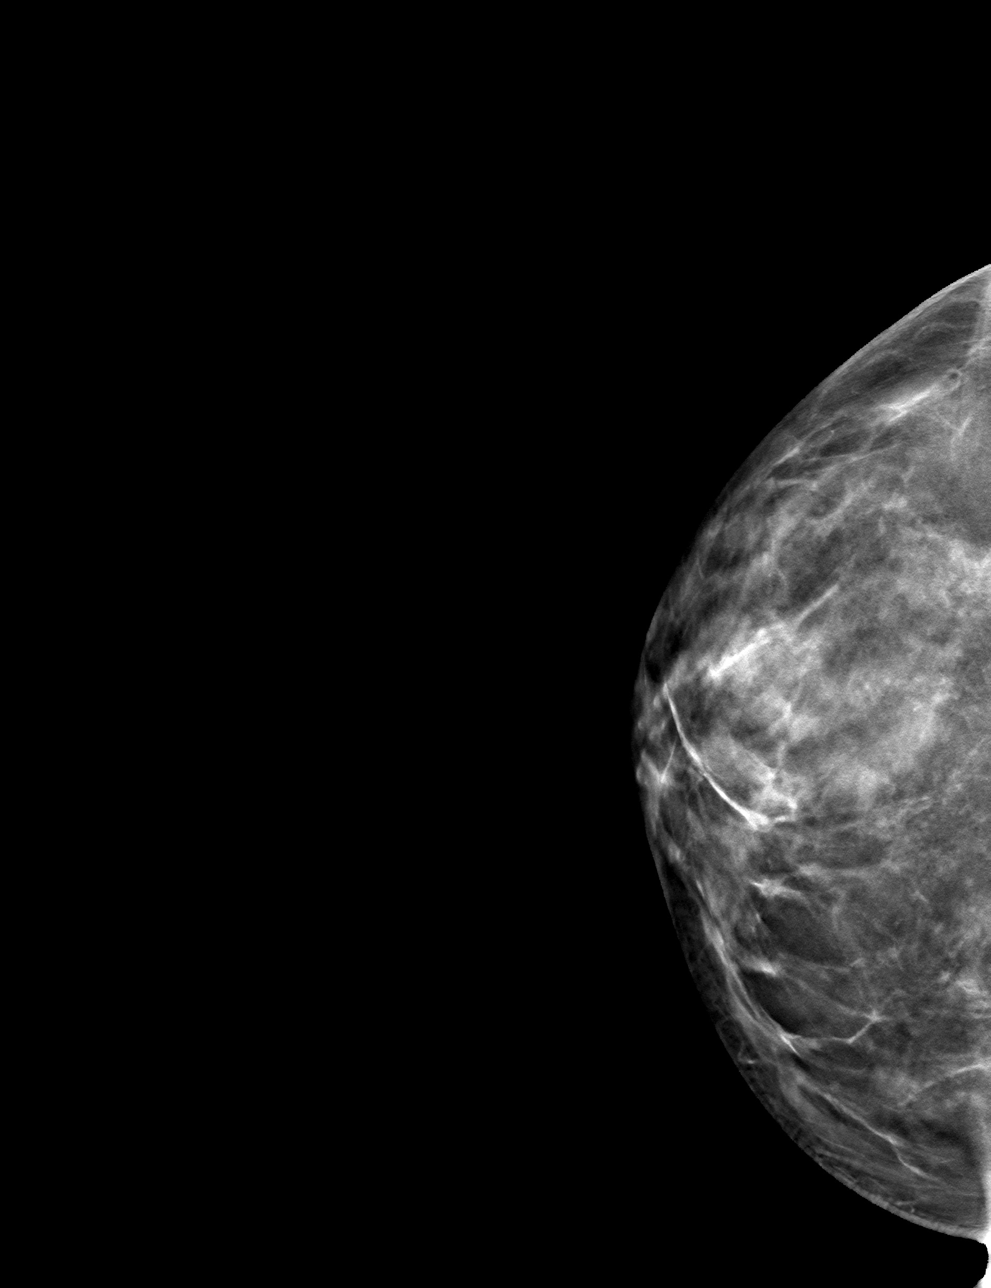

[R ML tomo · tomo slice 39/77.0]
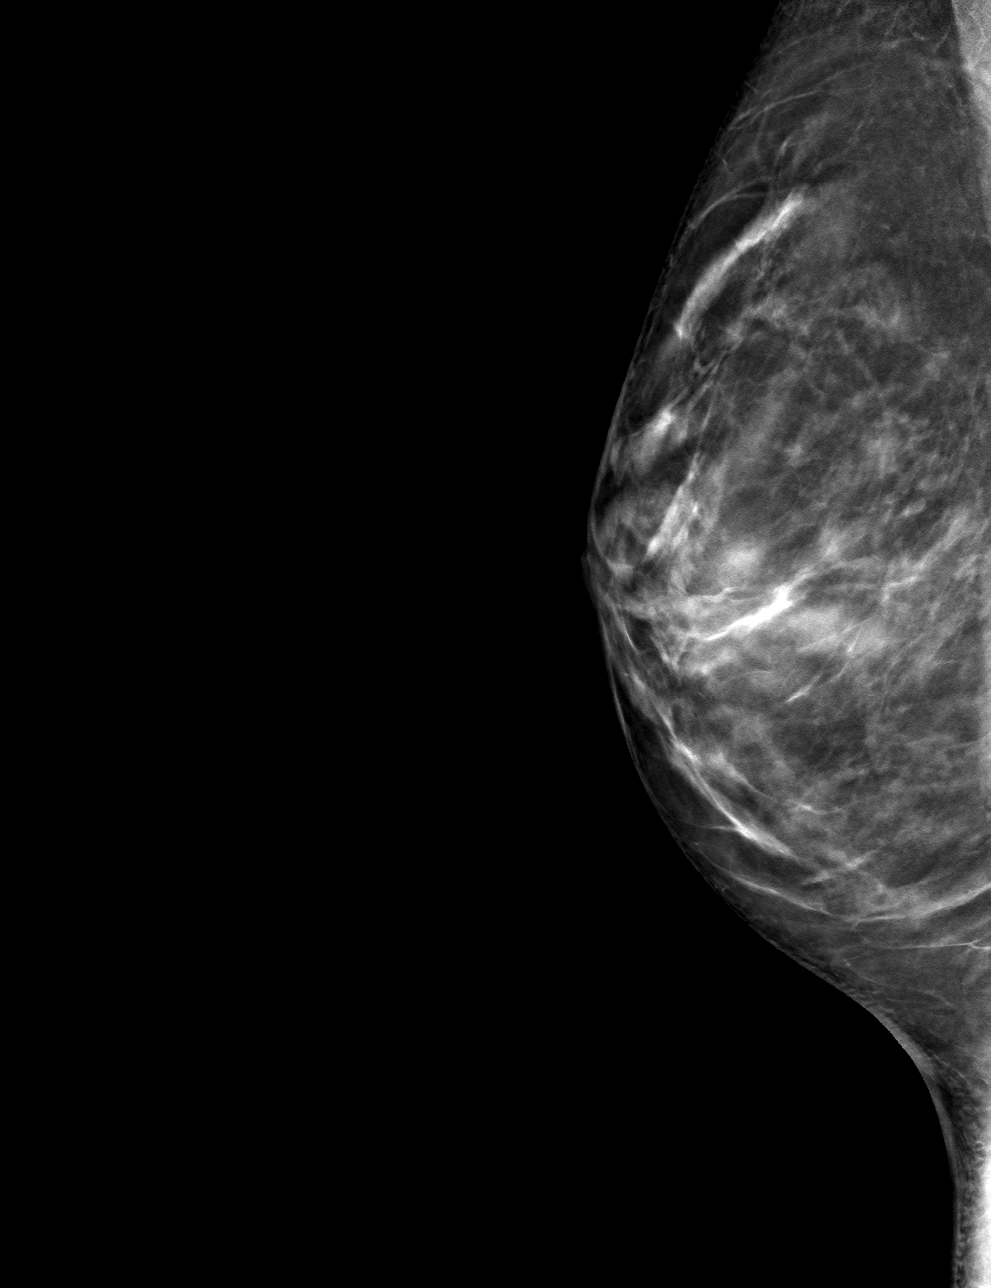

[4 of 12 positions shown; findings below may reference images not displayed]

FINDINGS: Mammographic images were obtained following ultrasound guided biopsy
of the right breast. The biopsy marking clip is in expected position
at the site of biopsy.
IMPRESSION: Appropriate positioning of the Q shaped biopsy marking clip at the
site of biopsy in the far posterior, upper-outer right breast.

Final Assessment: Post Procedure Mammograms for Marker Placement

## 2021-10-25 ENCOUNTER — Telehealth: Payer: Self-pay | Admitting: Internal Medicine

## 2021-10-25 NOTE — Telephone Encounter (Signed)
I called pt on 1/31; unable to reach pt. Left voicemail for pt to reach out to Dr.shah's office re: work accomodation.   Will speak to Dr.Shah also.

## 2021-10-27 ENCOUNTER — Encounter: Payer: Self-pay | Admitting: Internal Medicine

## 2021-10-27 NOTE — Telephone Encounter (Signed)
Please see MD phone note on 10/25/21.

## 2021-10-28 ENCOUNTER — Telehealth: Payer: Self-pay | Admitting: Internal Medicine

## 2021-10-28 NOTE — Telephone Encounter (Signed)
I discussed with Dr. Manuella Ghazi; patient neurologist regarding patient's need for accommodation at work given the neuropathy.  Their office will take care of it.  I sent a message to the patient on MyChart.

## 2021-11-04 ENCOUNTER — Encounter: Payer: Self-pay | Admitting: Plastic Surgery

## 2021-11-04 ENCOUNTER — Other Ambulatory Visit: Payer: Self-pay

## 2021-11-04 ENCOUNTER — Ambulatory Visit (INDEPENDENT_AMBULATORY_CARE_PROVIDER_SITE_OTHER): Payer: BC Managed Care – PPO | Admitting: Plastic Surgery

## 2021-11-04 DIAGNOSIS — Z9011 Acquired absence of right breast and nipple: Secondary | ICD-10-CM | POA: Diagnosis not present

## 2021-11-04 DIAGNOSIS — Z171 Estrogen receptor negative status [ER-]: Secondary | ICD-10-CM | POA: Diagnosis not present

## 2021-11-04 DIAGNOSIS — C50411 Malignant neoplasm of upper-outer quadrant of right female breast: Secondary | ICD-10-CM | POA: Diagnosis not present

## 2021-11-04 NOTE — Progress Notes (Signed)
° °  Subjective:    Patient ID: Beth Wells, female    DOB: [birth year], 46 y.o.   MRN: [id number]  The patient is a 46 year old female here for follow-up on her right breast reconstruction. The patient was diagnosed with right invasive mammary carcinoma.  It was triple negative.  She is 5 feet 2 inches tall and weighs around 96 pPounds.  Her preoperative bra size was a 32 B.  Her previous surgeries and clued kidney stone removal, ovarian cyst removal, D&C and a bunionectomy.  She underwent a right-sided mastectomy with expander placement in November 2021.  She had exchanged to an implant in June 2022 and had a high-profile 275 cc Mentor expander placed. She has done so well with her process.  She had a very tight right pectoralis muscle.  It has loosened a small bit but still has a prominent line at her pectoralis muscle.  There is a loss of volume in the inferior pole.  She also has a loss of volume in the superior pole.     Review of Systems  Constitutional: Negative.   Eyes: Negative.   Respiratory: Negative.    Cardiovascular: Negative.   Gastrointestinal: Negative.   Endocrine: Negative.   Genitourinary: Negative.   Musculoskeletal: Negative.       Objective:   Physical Exam Constitutional:      Appearance: Normal appearance.  Cardiovascular:     Rate and Rhythm: Normal rate.     Pulses: Normal pulses.  Pulmonary:     Effort: Pulmonary effort is normal.  Abdominal:     General: There is no distension.     Palpations: Abdomen is soft.  Musculoskeletal:        General: No swelling or deformity.  Skin:    General: Skin is warm.     Capillary Refill: Capillary refill takes less than 2 seconds.     Coloration: Skin is not jaundiced.     Findings: No bruising or lesion.  Neurological:     Mental Status: She is alert and oriented to person, place, and time.  Psychiatric:        Mood and Affect: Mood normal.        Behavior: Behavior normal.        Thought Content: Thought  content normal.       Assessment & Plan:     ICD-10-CM   1. Carcinoma of upper-outer quadrant of right breast in female, estrogen receptor negative ([place])  C50.411    Z17.1     2. Acquired absence of right breast  Z90.11       I would like to do some fat grafting.  She is having a really hard time with work and some stress.  She has lost a few pounds.  She will need to gain at least 5 pounds in order for [place] to be able to do the fat grafting. I gave her some suggestions for increasing her weight in a healthy way with increasing her protein like fair life protein drinks.  I would like her to work on this over the next 3 to 6 months and then we will do a telemetry visit and reevaluate.    Pictures were obtained of the patient and placed in the chart with the patient's or guardian's permission.

## 2021-11-15 ENCOUNTER — Other Ambulatory Visit: Payer: Self-pay

## 2021-11-15 ENCOUNTER — Encounter: Payer: Self-pay | Admitting: Student in an Organized Health Care Education/Training Program

## 2021-11-15 ENCOUNTER — Ambulatory Visit
Payer: BC Managed Care – PPO | Attending: Student in an Organized Health Care Education/Training Program | Admitting: Student in an Organized Health Care Education/Training Program

## 2021-11-15 VITALS — BP 116/80 | HR 85 | Temp 98.0°F | Resp 16 | Ht 62.0 in | Wt 96.0 lb

## 2021-11-15 DIAGNOSIS — G608 Other hereditary and idiopathic neuropathies: Secondary | ICD-10-CM | POA: Insufficient documentation

## 2021-11-15 DIAGNOSIS — R202 Paresthesia of skin: Secondary | ICD-10-CM | POA: Insufficient documentation

## 2021-11-15 DIAGNOSIS — G629 Polyneuropathy, unspecified: Secondary | ICD-10-CM | POA: Diagnosis not present

## 2021-11-15 DIAGNOSIS — C50411 Malignant neoplasm of upper-outer quadrant of right female breast: Secondary | ICD-10-CM | POA: Insufficient documentation

## 2021-11-15 DIAGNOSIS — G894 Chronic pain syndrome: Secondary | ICD-10-CM | POA: Diagnosis not present

## 2021-11-15 DIAGNOSIS — Z171 Estrogen receptor negative status [ER-]: Secondary | ICD-10-CM | POA: Insufficient documentation

## 2021-11-15 DIAGNOSIS — G603 Idiopathic progressive neuropathy: Secondary | ICD-10-CM | POA: Diagnosis not present

## 2021-11-15 MED ORDER — TRAMADOL HCL ER 200 MG PO TB24: 200.0000 mg | ORAL_TABLET | Freq: Every day | ORAL | 2 refills | Status: DC

## 2021-11-15 MED ORDER — TRAMADOL HCL 50 MG PO TABS: 50.0000 mg | ORAL_TABLET | Freq: Two times a day (BID) | ORAL | 2 refills | Status: DC | PRN

## 2021-11-15 NOTE — Progress Notes (Signed)
PROVIDER NOTE: Information contained herein reflects review and annotations entered in association with encounter. Interpretation of such information and data should be left to medically-trained personnel. Information provided to patient can be located elsewhere in the medical record under "Patient Instructions". Document created using STT-dictation technology, any transcriptional errors that may result from process are unintentional.    Patient: Beth Wells  Service Category: E/M  Provider: Gillis Santa, MD  DOB: 01/31/1976  DOS: 11/15/2021  Specialty: Interventional Pain Management  MRN: [id number]  Setting: Ambulatory outpatient  PCP: Elza Rafter, MD  Type: Established Patient    Referring Provider: Elza Rafter, *  Location: Office  Delivery: Face-to-face     HPI  Beth Wells, a 46 y.o. year old female, is here today because of her Chronic pain syndrome [G89.4]. Ms. Blanchett primary complain today is Leg Pain (bilateral)  Last encounter: My last encounter with her was on 08/09/21  Pertinent problems: Ms. Wormley has Small fiber neuropathy; Paresthesias; and Chronic pain syndrome on their pertinent problem list. Pain Assessment: Severity of Chronic pain is reported as a 6 /10. Location: Leg Right, Left/left worse than right. Onset: More than a month ago. Quality: Burning. Timing: Constant. Modifying factor(s): tigers balm, medication. Vitals:  height is 5' 2" (1.575 m) and weight is 96 lb (43.5 kg). Her temperature is 98 F (36.7 C). Her blood pressure is 116/80 and her pulse is 85. Her respiration is 16 and oxygen saturation is 100%.   Reason for encounter: medication management.    No change in medical history since last visit.  Patient's pain is at baseline.  Patient continues multimodal pain regimen as prescribed.  States that it provides pain relief and improvement in functional status. Has tried Capsaicin topical treatment with limited response   Pharmacotherapy  Assessment  Analgesic: tramadol 100 mg ER, continue tramadol IR 50 mg twice daily for breakthrough pain    Monitoring: [place] PMP: PDMP reviewed during this encounter.       Pharmacotherapy: No side-effects or adverse reactions reported. Compliance: No problems identified. Effectiveness: Clinically acceptable.  UDS:  Summary  Date Value Ref Range Status  03/22/2021 Note  Final    Comment:    ==================================================================== [employer] (MW) ==================================================================== Test                             Result       Flag       Units  Drug Present and Declared for Prescription Verification   Amphetamine                    >5263        EXPECTED   ng/mg creat    Amphetamine is available as a schedule II prescription drug.    Tramadol                       >2632        EXPECTED   ng/mg creat   O-Desmethyltramadol            >2632        EXPECTED   ng/mg creat   N-Desmethyltramadol            >2632        EXPECTED   ng/mg creat    Source of tramadol is a prescription medication. O-desmethyltramadol    and N-desmethyltramadol are expected metabolites of tramadol.  ==================================================================== Test  Result    Flag   Units      Ref Range   Creatinine              190              mg/dL      >=20 ==================================================================== Declared Medications:  The flagging and interpretation on this report are based on the  following declared medications.  Unexpected results may arise from  inaccuracies in the declared medications.   **Note: The testing scope of this panel includes these medications:   Amphetamine (Adderall)  Tramadol (Ultram)   **Note: The testing scope of this panel does not include the  following reported medications:   Lisinopril (Zestril)  Pregabalin (Lyrica)  Trolamine Salicylate (topical)  (Aspercreme) ==================================================================== For clinical consultation, please call [phone]. ====================================================================      ROS  Constitutional: Denies any fever or chills Gastrointestinal: No reported hemesis, hematochezia, vomiting, or acute GI distress Musculoskeletal: Denies any acute onset joint swelling, redness, loss of ROM, or weakness Neurological:  Lower extremity neuropathic pain  Medication Review  Alpha-Lipoic Acid, Probiotic Product, amphetamine-dextroamphetamine, capsicum, estradiol, lisinopril, pregabalin, traMADol, traZODone, and trolamine salicylate  History Review  Allergy: Ms. Avellino has No Known Allergies. Drug: Ms. Pakula  reports no history of drug use. Alcohol:  reports that she does not currently use alcohol. Tobacco:  reports that she has never smoked. She has never used smokeless tobacco. Social: Ms. Greenwood  reports that she has never smoked. She has never used smokeless tobacco. She reports that she does not currently use alcohol. She reports that she does not use drugs. Medical:  has a past medical history of Breast cancer ([place]) (08/05/2020), Carcinoma of upper-outer quadrant of right breast in female, estrogen receptor negative ([place]) (98/33/8250), Complication of anesthesia, H/O foot surgery (08/2018), History of kidney stones, Hypertension, and PONV (postoperative nausea and vomiting). Surgical: Ms. Morganti  has a past surgical history that includes Kidney stone surgery; Cervical cone biopsy; Dilation and evacuation (N/A, 03/12/2018); laparoscopy (N/A, 06/07/2019); Laparoscopic ovarian cystectomy (Right, 06/07/2019); Breast biopsy (Right, 07/06/2020); Bunionectomy (Right); Total mastectomy (Right, 08/05/2020); Portacath placement (Left, 08/05/2020); Breast reconstruction with placement of tissue expander and flex hd (acellular hydrated dermis) (Right, 08/05/2020); Removal of tissue  expander and placement of implant (Right, 03/09/2021); Mastopexy (Left, 03/09/2021); Reconstruction breast immediate / delayed w/ tissue expander (Right, 03/09/2021); Mastectomy (Right, 08/05/2020); and Breast reduction with mastopexy. Family: family history includes Cancer in an other family member.  Laboratory Chemistry Profile   Renal Lab Results  Component Value Date   BUN 27 (H) 08/29/2021   CREATININE 0.68 08/29/2021   GFRAA >60 06/07/2019   GFRNONAA >60 08/29/2021     Hepatic Lab Results  Component Value Date   AST 19 08/29/2021   ALT 12 08/29/2021   ALBUMIN 4.1 08/29/2021   ALKPHOS 72 08/29/2021     Electrolytes Lab Results  Component Value Date   NA 133 (L) 08/29/2021   K 3.7 08/29/2021   CL 100 08/29/2021   CALCIUM 8.5 (L) 08/29/2021     Bone No results found for: VD25OH, [id number], XT0240XB3, ZH2992EQ6, 25OHVITD1, 25OHVITD2, 25OHVITD3, TESTOFREE, TESTOSTERONE   Inflammation (CRP: Acute Phase) (ESR: Chronic Phase) Lab Results  Component Value Date   LATICACIDVEN 1.4 10/18/2020       Note: Above Lab results reviewed.  Recent Imaging Review  MM DIAG BREAST TOMO UNI LEFT CLINICAL DATA:  46 year old who underwent malignant RIGHT mastectomy in November, 2021, with LEFT breast mastopexy at that  time. She presents with intermittent diffuse LEFT breast pain. Recent Port-A-Cath removal from the LEFT chest wall.  EXAM: DIGITAL DIAGNOSTIC UNILATERAL LEFT MAMMOGRAM WITH TOMOSYNTHESIS AND CAD  TECHNIQUE: Left digital diagnostic mammography and breast tomosynthesis was performed. The images were evaluated with computer-aided detection.  COMPARISON:  Previous exam(s).  ACR Breast Density Category c: The breast tissue is heterogeneously dense, which may obscure small masses.  FINDINGS: Full field CC and MLO views and a spot compression view of a mammographic finding in the upper breast at posterior depth were performed.  An asymmetry in the upper breast  at posterior depth on the full field MLO view is shown on the spot compression view to represent post surgical changes at the site of the Port-A-Cath removal, as the scar at the Port-A-Cath removal site was marked with an opaque linear marker.  No findings suspicious for malignancy.  IMPRESSION: No mammographic evidence of malignancy involving the LEFT breast.  RECOMMENDATION: Screening LEFT mammogram in one year.(Code:[id number])  I have discussed the findings and recommendations with the patient. If applicable, a reminder letter will be sent to the patient regarding the next appointment.  BI-RADS CATEGORY  2: Benign.  Electronically Signed   By: Evangeline Dakin M.D.   On: 08/30/2021 11:50  Note: Reviewed        Physical Exam  General appearance: Well nourished, well developed, and well hydrated. In no apparent acute distress Mental status: Alert, oriented x 3 (person, place, & time)       Respiratory: No evidence of acute respiratory distress Eyes: PERLA Vitals: BP 116/80    Pulse 85    Temp 98 F (36.7 C)    Resp 16    Ht 5' 2" (1.575 m)    Wt 96 lb (43.5 kg)    SpO2 100%    BMI 17.56 kg/m  BMI: Estimated body mass index is 17.56 kg/m as calculated from the following:   Height as of this encounter: 5' 2" (1.575 m).   Weight as of this encounter: 96 lb (43.5 kg). Ideal: Female patients must weigh at least 45.5 kg to calculate ideal body weight  Neurogenic pain pattern of lower extremity 5 out of 5 strength bilateral lower extremity: Plantar flexion, dorsiflexion, knee flexion, knee extension.   Assessment   Diagnosis  1. Chronic pain syndrome   2. Small fiber neuropathy   3. Idiopathic progressive neuropathy   4. Idiopathic small fiber sensory neuropathy   5. Carcinoma of upper-outer quadrant of right breast in female, estrogen receptor negative ([place])   6. Paresthesias          Plan of Care  Ms. Lidwina Kaner has a current medication list which includes  the following long-term medication(s): amphetamine-dextroamphetamine, lisinopril, pregabalin, and trazodone.  Pharmacotherapy (Medications Ordered): Meds ordered this encounter  Medications   traMADol (ULTRAM) 50 MG tablet    Sig: Take 1 tablet (50 mg total) by mouth every 12 (twelve) hours as needed for severe pain.    Dispense:  60 tablet    Refill:  2    To last for 30 days from fill date   traMADol (ULTRAM-ER) 200 MG 24 hr tablet    Sig: Take 1 tablet (200 mg total) by mouth daily.    Dispense:  30 tablet    Refill:  2    For chronic pain syndrome   Continue with capsaicin cream Consider Qutenza in future   Orders:  No orders of the defined types were placed in  this encounter.     Follow-up plan:   Return in about 3 months (around 02/12/2022) for Medication Management, in person.   Recent Visits No visits were found meeting these conditions. Showing recent visits within past 90 days and meeting all other requirements Today's Visits Date Type Provider Dept  11/15/21 Office Visit Gillis Santa, MD [facility]  Showing today's visits and meeting all other requirements Future Appointments Date Type Provider Dept  02/07/22 Appointment Gillis Santa, MD [facility]  Showing future appointments within next 90 days and meeting all other requirements  I discussed the assessment and treatment plan with the patient. The patient was provided an opportunity to ask questions and all were answered. The patient agreed with the plan and demonstrated an understanding of the instructions.  Patient advised to call back or seek an in-person evaluation if the symptoms or condition worsens.  Duration of encounter: 30 minutes.  Note by: Gillis Santa, MD Date: 11/15/2021; Time: 3:22 PM

## 2021-11-15 NOTE — Progress Notes (Signed)
Nursing Pain Medication Assessment:  Safety precautions to be maintained throughout the outpatient stay will include: orient to surroundings, keep bed in low position, maintain call bell within reach at all times, provide assistance with transfer out of bed and ambulation.  Medication Inspection Compliance: Beth Wells did not comply with our request to bring her pills to be counted. She was reminded that bringing the medication bottles, even when empty, is a requirement.  Medication: None brought in. Pill/Patch Count: None available to be counted. Bottle Appearance: No container available. Did not bring bottle(s) to appointment. Filled Date: N/A Last Medication intake:  Today

## 2022-02-07 ENCOUNTER — Ambulatory Visit
Payer: BC Managed Care – PPO | Attending: Student in an Organized Health Care Education/Training Program | Admitting: Student in an Organized Health Care Education/Training Program

## 2022-02-07 ENCOUNTER — Encounter: Payer: Self-pay | Admitting: Student in an Organized Health Care Education/Training Program

## 2022-02-07 ENCOUNTER — Other Ambulatory Visit: Payer: Self-pay

## 2022-02-07 ENCOUNTER — Telehealth: Payer: BC Managed Care – PPO | Admitting: Plastic Surgery

## 2022-02-07 DIAGNOSIS — R202 Paresthesia of skin: Secondary | ICD-10-CM | POA: Insufficient documentation

## 2022-02-07 DIAGNOSIS — G603 Idiopathic progressive neuropathy: Secondary | ICD-10-CM | POA: Insufficient documentation

## 2022-02-07 DIAGNOSIS — G894 Chronic pain syndrome: Secondary | ICD-10-CM | POA: Insufficient documentation

## 2022-02-07 DIAGNOSIS — G629 Polyneuropathy, unspecified: Secondary | ICD-10-CM | POA: Insufficient documentation

## 2022-02-07 MED ORDER — TRAMADOL HCL ER 200 MG PO TB24: 200.0000 mg | ORAL_TABLET | Freq: Every day | ORAL | 2 refills | Status: DC

## 2022-02-07 MED ORDER — PREGABALIN 100 MG PO CAPS: 100.0000 mg | ORAL_CAPSULE | Freq: Two times a day (BID) | ORAL | 1 refills | Status: DC

## 2022-02-07 NOTE — Progress Notes (Signed)
PROVIDER NOTE: Information contained herein reflects review and annotations entered in association with encounter. Interpretation of such information and data should be left to medically-trained personnel. Information provided to patient can be located elsewhere in the medical record under "Patient Instructions". Document created using STT-dictation technology, any transcriptional errors that may result from process are unintentional.  ?  ?Patient: Beth Wells  Service Category: E/M  Provider: Gillis Santa, MD  ?DOB: Sep 19, 1976  DOS: 02/07/2022  Specialty: Interventional Pain Management  ?MRN: [id number]  Setting: Ambulatory outpatient  PCP: Elza Rafter, MD  ?Type: Established Patient    Referring Provider: Elza Rafter, *  ?Location: Office  Delivery: Face-to-face    ? ?HPI  ?Beth Wells, a 46 y.o. year old female, is here today because of her No primary diagnosis found.. Beth Wells primary complain today is Other (Small fiber neuropathy/Chemo induced chemo neuropathy ) ? ?Last encounter: My last encounter with her was on 11/15/21 ? ?Pertinent problems: Beth Wells has Small fiber neuropathy; Paresthesias; and Chronic pain syndrome on their pertinent problem list. ?Pain Assessment: Severity of Chronic pain is reported as a 5 /10. Location: Leg (thighs, more so on the left.  patient has began to notice burning in her left cheek and neck also on her back.) Left, Right/burning can jump around from place to place.  always in the legs and now into the feet, feet have improved.. Onset: More than a month ago. Quality: Burning, Constant, Discomfort, Tingling (temperature changes causing an icy feeling.). Timing: Constant. Modifying factor(s): chiropractor program.  infrared light therapy.  was using heat but has caused mottling in her legs.Marland Kitchen ?Vitals:  height is '5\' 2"'  (1.575 m) and weight is 95 lb (43.1 kg). Her temporal temperature is 98.1 ?F (36.7 ?C). Her blood pressure is 112/80 and her pulse is 94.  Her respiration is 16 and oxygen saturation is 100%.  ? ?Reason for encounter: medication management.   ? ?No change in medical history since last visit.  Patient's pain is at baseline.  Patient continues multimodal pain regimen as prescribed.  States that it provides pain relief and improvement in functional status. ?She is taking less immediate release tramadol and is finding better baseline pain relief with tramadol 200 mg ER.  She is having increased dry mouth.  She is wondering if this is coming from Lyrica and I instructed her to reduce her dose to 100 mg nightly to see if that helps. ?I will refill her tramadol ER and Lyrica as below, no refills needed on tramadol IR since patient is taking this less frequently. ? ?Pharmacotherapy Assessment  ?Analgesic: tramadol 200 mg ER, continue tramadol IR 50 mg twice daily as needed for breakthrough pain   ? ?Monitoring: ?[place] PMP: PDMP reviewed during this encounter.       ?Pharmacotherapy: No side-effects or adverse reactions reported. ?Compliance: No problems identified. ?Effectiveness: Clinically acceptable.  UDS:  ?Summary  ?Date Value Ref Range Status  ?03/22/2021 Note  Final  ?  Comment:  ?  ==================================================================== ?[employer] Select 13 (MW) ?==================================================================== ?Test                             Result       Flag       Units ? ?Drug Present and Declared for Prescription Verification ?  Amphetamine                    [phone]  EXPECTED   ng/mg creat ?   Amphetamine is available as a schedule II prescription drug. ? ?  Tramadol                       >2632        EXPECTED   ng/mg creat ?  O-Desmethyltramadol            >2632        EXPECTED   ng/mg creat ?  N-Desmethyltramadol            >2632        EXPECTED   ng/mg creat ?   Source of tramadol is a prescription medication. O-desmethyltramadol ?   and N-desmethyltramadol are expected metabolites of  tramadol. ? ?==================================================================== ?Test                      Result    Flag   Units      Ref Range ?  Creatinine              190              mg/dL      >=20 ?==================================================================== ?Declared Medications: ? The flagging and interpretation on this report are based on the ? following declared medications.  Unexpected results may arise from ? inaccuracies in the declared medications. ? ? **Note: The testing scope of this panel includes these medications: ? ? Amphetamine (Adderall) ? Tramadol (Ultram) ? ? **Note: The testing scope of this panel does not include the ? following reported medications: ? ? Lisinopril (Zestril) ? Pregabalin (Lyrica) ? Trolamine Salicylate (topical) (Aspercreme) ?==================================================================== ?For clinical consultation, please call [phone]. ?==================================================================== ?  ?  ? ?ROS  ?Constitutional: Denies any fever or chills ?Gastrointestinal: No reported hemesis, hematochezia, vomiting, or acute GI distress ?Musculoskeletal: Denies any acute onset joint swelling, redness, loss of ROM, or weakness ?Neurological:  Lower extremity neuropathic pain ? ?Medication Review  ?Alpha-Lipoic Acid, Probiotic Product, amphetamine-dextroamphetamine, capsicum, estradiol, pregabalin, traMADol, traZODone, and trolamine salicylate ? ?History Review  ?Allergy: Beth Wells has No Known Allergies. ?Drug: Beth Wells  reports no history of drug use. ?Alcohol:  reports that she does not currently use alcohol. ?Tobacco:  reports that she has never smoked. She has never used smokeless tobacco. ?Social: Beth Wells  reports that she has never smoked. She has never used smokeless tobacco. She reports that she does not currently use alcohol. She reports that she does not use drugs. ?Medical:  has a past medical history of Breast cancer ([place])  (08/05/2020), Carcinoma of upper-outer quadrant of right breast in female, estrogen receptor negative ([place]) (32/99/2426), Complication of anesthesia, H/O foot surgery (08/2018), History of kidney stones, Hypertension, and PONV (postoperative nausea and vomiting). ?Surgical: Beth Wells  has a past surgical history that includes Kidney stone surgery; Cervical cone biopsy; Dilation and evacuation (N/A, 03/12/2018); laparoscopy (N/A, 06/07/2019); Laparoscopic ovarian cystectomy (Right, 06/07/2019); Breast biopsy (Right, 07/06/2020); Bunionectomy (Right); Total mastectomy (Right, 08/05/2020); Portacath placement (Left, 08/05/2020); Breast reconstruction with placement of tissue expander and flex hd (acellular hydrated dermis) (Right, 08/05/2020); Removal of tissue expander and placement of implant (Right, 03/09/2021); Mastopexy (Left, 03/09/2021); Reconstruction breast immediate / delayed w/ tissue expander (Right, 03/09/2021); Mastectomy (Right, 08/05/2020); and Breast reduction with mastopexy. ?Family: family history includes Cancer in an other family member. ? ?Laboratory Chemistry Profile  ? ?Renal ?Lab Results  ?Component Value Date  ? BUN 27 (H) 08/29/2021  ? CREATININE  0.68 08/29/2021  ? GFRAA >60 06/07/2019  ? GFRNONAA >60 08/29/2021  ? ?  Hepatic ?Lab Results  ?Component Value Date  ? AST 19 08/29/2021  ? ALT 12 08/29/2021  ? ALBUMIN 4.1 08/29/2021  ? ALKPHOS 72 08/29/2021  ? ?  ?Electrolytes ?Lab Results  ?Component Value Date  ? NA 133 (L) 08/29/2021  ? K 3.7 08/29/2021  ? CL 100 08/29/2021  ? CALCIUM 8.5 (L) 08/29/2021  ? ?  Bone ?No results found for: [place], [id number], [id number], [id number], 25OHVITD1, 25OHVITD2, 25OHVITD3, TESTOFREE, TESTOSTERONE ?  ?Inflammation (CRP: Acute Phase) (ESR: Chronic Phase) ?Lab Results  ?Component Value Date  ? LATICACIDVEN 1.4 10/18/2020  ? ?    ?Note: Above Lab results reviewed. ? ?Recent Imaging Review  ?MM DIAG BREAST TOMO UNI LEFT ?CLINICAL DATA:  46 year old who  underwent malignant RIGHT mastectomy ?in November, 2021, with LEFT breast mastopexy at that time. She ?presents with intermittent diffuse LEFT breast pain. Recent ?Port-A-Cath removal from the LEFT chest wall. ? ?EXAM: ?DIGITAL DIAGNOS

## 2022-02-07 NOTE — Progress Notes (Signed)
Nursing Pain Medication Assessment:  ?Safety precautions to be maintained throughout the outpatient stay will include: orient to surroundings, keep bed in low position, maintain call bell within reach at all times, provide assistance with transfer out of bed and ambulation.  ?Medication Inspection Compliance: Beth Wells did not comply with our request to bring her pills to be counted. She was reminded that bringing the medication bottles, even when empty, is a requirement. ? ?Medication: None brought in. ?Pill/Patch Count: None available to be counted. ?Bottle Appearance: No container available. Did not bring bottle(s) to appointment. ?Filled Date: N/A ?Last Medication intake:  Today ? ?Tramadol ER 200 mg daily ?Tramadol 50 mg q12 hours prn severe pain.  ?

## 2022-02-24 ENCOUNTER — Other Ambulatory Visit: Payer: Self-pay

## 2022-02-24 DIAGNOSIS — Z171 Estrogen receptor negative status [ER-]: Secondary | ICD-10-CM

## 2022-02-27 ENCOUNTER — Inpatient Hospital Stay: Payer: BC Managed Care – PPO | Attending: Internal Medicine

## 2022-02-27 ENCOUNTER — Inpatient Hospital Stay (HOSPITAL_BASED_OUTPATIENT_CLINIC_OR_DEPARTMENT_OTHER): Payer: BC Managed Care – PPO | Admitting: Internal Medicine

## 2022-02-27 ENCOUNTER — Encounter: Payer: Self-pay | Admitting: Internal Medicine

## 2022-02-27 DIAGNOSIS — G629 Polyneuropathy, unspecified: Secondary | ICD-10-CM | POA: Insufficient documentation

## 2022-02-27 DIAGNOSIS — Z171 Estrogen receptor negative status [ER-]: Secondary | ICD-10-CM

## 2022-02-27 DIAGNOSIS — I10 Essential (primary) hypertension: Secondary | ICD-10-CM | POA: Diagnosis not present

## 2022-02-27 DIAGNOSIS — Z800 Family history of malignant neoplasm of digestive organs: Secondary | ICD-10-CM | POA: Insufficient documentation

## 2022-02-27 DIAGNOSIS — C50411 Malignant neoplasm of upper-outer quadrant of right female breast: Secondary | ICD-10-CM

## 2022-02-27 DIAGNOSIS — Z803 Family history of malignant neoplasm of breast: Secondary | ICD-10-CM | POA: Insufficient documentation

## 2022-02-27 LAB — CBC WITH DIFFERENTIAL/PLATELET
Abs Immature Granulocytes: 0.01 10*3/uL (ref 0.00–0.07)
Basophils Absolute: 0 10*3/uL (ref 0.0–0.1)
Basophils Relative: 1 %
Eosinophils Absolute: 0.1 10*3/uL (ref 0.0–0.5)
Eosinophils Relative: 2 %
HCT: 37 % (ref 36.0–46.0)
Hemoglobin: 12.6 g/dL (ref 12.0–15.0)
Immature Granulocytes: 0 %
Lymphocytes Relative: 27 %
Lymphs Abs: 1.2 10*3/uL (ref 0.7–4.0)
MCH: 32.1 pg (ref 26.0–34.0)
MCHC: 34.1 g/dL (ref 30.0–36.0)
MCV: 94.4 fL (ref 80.0–100.0)
Monocytes Absolute: 0.4 10*3/uL (ref 0.1–1.0)
Monocytes Relative: 10 %
Neutro Abs: 2.7 10*3/uL (ref 1.7–7.7)
Neutrophils Relative %: 60 %
Platelets: 182 10*3/uL (ref 150–400)
RBC: 3.92 MIL/uL (ref 3.87–5.11)
RDW: 12.1 % (ref 11.5–15.5)
WBC: 4.4 10*3/uL (ref 4.0–10.5)
nRBC: 0 % (ref 0.0–0.2)

## 2022-02-27 LAB — COMPREHENSIVE METABOLIC PANEL
ALT: 13 U/L (ref 0–44)
AST: 18 U/L (ref 15–41)
Albumin: 4 g/dL (ref 3.5–5.0)
Alkaline Phosphatase: 47 U/L (ref 38–126)
Anion gap: 4 — ABNORMAL LOW (ref 5–15)
BUN: 31 mg/dL — ABNORMAL HIGH (ref 6–20)
CO2: 28 mmol/L (ref 22–32)
Calcium: 8.7 mg/dL — ABNORMAL LOW (ref 8.9–10.3)
Chloride: 102 mmol/L (ref 98–111)
Creatinine, Ser: 0.88 mg/dL (ref 0.44–1.00)
GFR, Estimated: >60 mL/min (ref >60)
Glucose, Bld: 110 mg/dL — ABNORMAL HIGH (ref 70–99)
Potassium: 4.3 mmol/L (ref 3.5–5.1)
Sodium: 134 mmol/L — ABNORMAL LOW (ref 135–145)
Total Bilirubin: 0.3 mg/dL (ref 0.3–1.2)
Total Protein: 6.8 g/dL (ref 6.5–8.1)

## 2022-02-27 NOTE — Progress Notes (Signed)
[facility] [place] NOTE  Patient Care Team: Jose-Mathews, Nada Boozer, MD as PCP - General (Family Medicine) Cammie Sickle, [name] Gillis Santa, MD as Consulting Physician (Pain Medicine) Herbert Pun, MD as Consulting Physician (General Surgery) Dillingham, Loel Lofty, DO as Attending Physician (Plastic Surgery)  CHIEF COMPLAINTS/PURPOSE OF CONSULTATION: Breast cancer  #  Oncology History Overview Note  # OCT 2021-RIGHT BREAST- TRIPLE NEGATIVE [place]; [US/mammo-29m]; Dr.Cintron; OCT 2021-USThere is a 7 mm mass in the right breast at 10 o'clock, favored to represent a complicated cyst;  No evidence of right axillary lymphadenopathy].   # NOV 2021- Stage I pT1bp N0 [s/p mastectomy; Dr.Cintron]; G-3;   # DEC 17th, 2021- AC x4; 11/29/2020- Taxol weekly [672mm2-3 W-ON & 1 W-OFF sec to PN]  # 2018-Small Fibre neuropathy [Dr.Shah]- skin biopsy- on cymblata+ Lyrica  # DEC 2021-status post evaluation with reproductive endocrinology [9-10% chance of viable pregnancy] # # SURVIVORSHIP:   # GENETICS: BARD  DIAGNOSIS: Breast cancer-triple negative  STAGE:  I       ;  GOALS: cure  CURRENT/MOST RECENT THERAPY : AC-T    Carcinoma of upper-outer quadrant of right breast in female, estrogen receptor negative ([place] 07/13/2020 Initial Diagnosis   Carcinoma of upper-outer quadrant of right breast in female, estrogen receptor negative ([facility] [place]   Genetic Testing   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 The report date is 08/02/2020.  The Multi-Cancer Panel offered by [facility] includes sequencing and/or deletion duplication testing of the following 85 genes: AIP, ALK, APC, ATM, AXIN2,BAP1,  BARD1, BLM, BMPR1A, BRCA1, BRCA2, BRIP1, CASR, CDC73, CDH1, CDK4, CDKN1B, CDKN1C, CDKN2A (p14ARF), CDKN2A (p16INK4a), CEBPA, CHEK2, CTNNA1, DICER1, DIS3L2, EGFR (c.2369C>T, p.Thr790Met variant only), EPCAM  (Deletion/duplication testing only), FH, FLCN, GATA2, GPC3, GREM1 (Promoter region deletion/duplication testing only), HOXB13 (c.251G>A, p.Gly84Glu), HRAS, KIT, MAX, MEN1, MET, MITF (c.952G>A, p.Glu318Lys variant only), MLH1, MSH2, MSH3, MSH6, MUTYH, NBN, NF1, NF2, NTHL1, PALB2, PDGFRA, PHOX2B, PMS2, POLD1, POLE, POT1, PRKAR1A, PTCH1, PTEN, RAD50, RAD51C, RAD51D, RB1, RECQL4, RET, RNF43, RUNX1, SDHAF2, SDHA (sequence changes only), SDHB, SDHC, SDHD, SMAD4, SMARCA4, SMARCB1, SMARCE1, STK11, SUFU, TERC, TERT, TMEM127, TP53, TSC1, TSC2, VHL, WRN and WT1.    09/10/2020 -  Chemotherapy    Patient is on Treatment Plan: BREAST ADJUVANT DOSE DENSE AC Q14D / PACLITAXEL Q7D          HISTORY OF PRESENTING ILLNESS: Alone.  Ambulating independently. KaGarry Bochicchio89.o.  female   with stage I triple negative breast cancer s/p adjuvant chemotherapy is here for follow-up.  Patient has been evaluated and treated by chiropractor.  Noted to have improvement of her neuropathy in the legs.  However she continues to be quite distractible at work given her history of ADD and complicated by chemotherapy.   Review of Systems  Constitutional:  Positive for malaise/fatigue. Negative for chills, diaphoresis, fever and weight loss.  HENT:  Negative for nosebleeds and sore throat.   Eyes:  Negative for double vision.  Respiratory:  Negative for cough, hemoptysis, sputum production, shortness of breath and wheezing.   Cardiovascular:  Negative for chest pain, palpitations, orthopnea and leg swelling.  Gastrointestinal:  Negative for abdominal pain, blood in stool, diarrhea, heartburn, melena and vomiting.  Genitourinary:  Negative for dysuria, frequency and urgency.  Musculoskeletal:  Positive for joint pain and myalgias. Negative for back pain.  Skin:  Positive for itching and rash.  Neurological:  Positive for tingling. Negative for dizziness,  focal weakness, weakness and headaches.  Endo/Heme/Allergies:  Does not  bruise/bleed easily.  Psychiatric/Behavioral:  Negative for depression. The patient is not nervous/anxious and does not have insomnia.     MEDICAL HISTORY:  Past Medical History:  Diagnosis Date   Breast cancer ([place]) 08/05/2020   Carcinoma of upper-outer quadrant of right breast in female, estrogen receptor negative ([place]) 95/05/3266   Complication of anesthesia    hard to wake up-very dizzy feeling like she was going to pass out   H/O foot surgery 08/2018   History of kidney stones    h/o   Hypertension    PONV (postoperative nausea and vomiting)    n/v aftter kidney stone surgery    SURGICAL HISTORY: Past Surgical History:  Procedure Laterality Date   BREAST BIOPSY Right 07/06/2020   u/s bx Q clip  INVASIVE MAMMARY CARCINOMA, NO SPECIAL   BREAST RECONSTRUCTION WITH PLACEMENT OF TISSUE EXPANDER AND FLEX HD (ACELLULAR HYDRATED DERMIS) Right 08/05/2020   Procedure: RIGHT BREAST RECONSTRUCTION WITH PLACEMENT OF TISSUE EXPANDER AND FLEX HD (ACELLULAR HYDRATED DERMIS);  Surgeon: Wallace Going, DO;  Location: [facility];  Service: Plastics;  Laterality: Right;   BREAST REDUCTION WITH MASTOPEXY     BUNIONECTOMY Right    titanium pin   CERVICAL CONE BIOPSY     DILATION AND EVACUATION N/A 03/12/2018   Procedure: DILATATION AND EVACUATION;  Surgeon: [name], MD;  Location: [facility];  Service: Gynecology;  Laterality: N/A;   KIDNEY STONE SURGERY     LAPAROSCOPIC OVARIAN CYSTECTOMY Right 06/07/2019   Procedure: LAPAROSCOPIC OVARIAN CYSTECTOMY;  Surgeon: [name], MD;  Location: [facility];  Service: Gynecology;  Laterality: Right;   LAPAROSCOPY N/A 06/07/2019   Procedure: LAPAROSCOPY Luvenia Redden BIOPSIES;  Surgeon: [name], MD;  Location: [facility];  Service: Gynecology;  Laterality: N/A;   MASTECTOMY Right 08/05/2020   RIGHT mastectomy with implant 03/09/2021   MASTOPEXY Left 03/09/2021   Procedure: MASTOPEXY LEFT BREAST;  Surgeon: Wallace Going, DO;  Location: [facility];  Service: Plastics;  Laterality: Left;   PORTACATH PLACEMENT Left 08/05/2020   Procedure: INSERTION PORT-A-CATH;  Surgeon: Herbert Pun, MD;  Location: [facility];  Service: General;  Laterality: Left;   RECONSTRUCTION BREAST IMMEDIATE / DELAYED W/ TISSUE EXPANDER Right 03/09/2021   REMOVAL OF TISSUE EXPANDER AND PLACEMENT OF IMPLANT Right 03/09/2021   Procedure: Removal of right breast expander with placement of implant;  Surgeon: Wallace Going, DO;  Location: [facility];  Service: Plastics;  Laterality: Right;  2.5 hours   TOTAL MASTECTOMY Right 08/05/2020   Procedure: TOTAL MASTECTOMY w/ Sentinel Node;  Surgeon: Herbert Pun, MD;  Location: [facility];  Service: General;  Laterality: Right;    SOCIAL HISTORY: Social History   Socioeconomic History   Marital status: Married    Spouse name: Doug   Number of children: 0   Years of education: Not on file   Highest education level: Not on file  Occupational History   Occupation: Market researcher: [employer]  Tobacco Use   Smoking status: Never   Smokeless tobacco: Never  Vaping Use   Vaping Use: Never used  Substance and Sexual Activity   Alcohol use: Not Currently   Drug use: Never   Sexual activity: Yes    Birth control/protection: Condom  Other Topics Concern   Not on file  Social History Narrative   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  No children. Lives in [place].    Social Determinants of Health   Financial Resource Strain: Not on file  Food Insecurity: Not on file  Transportation Needs: Not on file  Physical Activity: Not on file  Stress: Not on file  Social Connections: Not on file  Intimate Partner Violence: Not on file    FAMILY HISTORY: Family History  Problem Relation Age of Onset   Cancer Other        great aunt- GI cancer   Breast cancer Neg Hx      ALLERGIES:  has No Known Allergies.  MEDICATIONS:  Current Outpatient Medications  Medication Sig Dispense Refill   ALPHA-LIPOIC ACID PO Take 500 mg by mouth daily.      amphetamine-dextroamphetamine (ADDERALL XR) 30 MG 24 hr capsule Take 30 mg by mouth daily.      capsicum (ZOSTRIX) 0.075 % topical cream Apply 1 application topically 2 (two) times daily.     estradiol (ESTRACE) 0.1 MG/GM vaginal cream Place vaginally once a week.     pregabalin (LYRICA) 100 MG capsule Take 1 capsule (100 mg total) by mouth 2 (two) times daily. 180 capsule 1   Probiotic Product (PROBIOTIC ADVANCED PO) Take 1 capsule by mouth daily.      [START ON 03/04/2022] traMADol (ULTRAM-ER) 200 MG 24 hr tablet Take 1 tablet (200 mg total) by mouth daily. 30 tablet 2   traZODone (DESYREL) 50 MG tablet Take 50 mg by mouth at bedtime.     trolamine salicylate (ASPERCREME) 10 % cream Apply 1 application topically as needed for muscle pain.     No current facility-administered medications for this visit.      Marland Kitchen  PHYSICAL EXAMINATION: ECOG PERFORMANCE STATUS: 0 - Asymptomatic  Vitals:   02/27/22 1501  BP: (!) 119/92  Pulse: 86  Resp: 18  Temp: 97.8 F (36.6 C)  SpO2: 100%   There were no vitals filed for this visit.   Physical Exam Constitutional:      Comments: Alone; ambulating independently.   HENT:     Head: Normocephalic and atraumatic.     Mouth/Throat:     Pharynx: No oropharyngeal exudate.  Eyes:     Pupils: Pupils are equal, round, and reactive to light.  Cardiovascular:     Rate and Rhythm: Normal rate and regular rhythm.  Pulmonary:     Effort: Pulmonary effort is normal. No respiratory distress.     Breath sounds: Normal breath sounds. No wheezing.  Abdominal:     General: Bowel sounds are normal. There is no distension.     Palpations: Abdomen is soft. There is no mass.     Tenderness: There is no abdominal tenderness. There is no guarding or rebound.  Musculoskeletal:         General: No tenderness. Normal range of motion.     Cervical back: Normal range of motion and neck supple.  Skin:    General: Skin is warm.  Neurological:     Mental Status: She is alert and oriented to person, place, and time.  Psychiatric:        Mood and Affect: Affect normal.     LABORATORY DATA:  I have reviewed the data as listed Lab Results  Component Value Date   WBC 4.4 02/27/2022   HGB 12.6 02/27/2022   HCT 37.0 02/27/2022   MCV 94.4 02/27/2022   PLT 182 02/27/2022   Recent Labs    04/25/21 0956 08/29/21 1449 02/27/22 1438  NA  136 133* 134*  K 4.6 3.7 4.3  CL 102 100 102  CO2 _0 GLUCOSE 92 100* 110*  BUN 35* 27* 31*  CREATININE 0.96 0.68 0.88  CALCIUM 9.6 8.5* 8.7*  GFRNONAA >60 >60 >60  PROT 7.0 6.8 6.8  ALBUMIN 4.4 4.1 4.0  AST _1 ALT _2 ALKPHOS 64 72 47  BILITOT 0.3 0.4 0.3    RADIOGRAPHIC STUDIES: I have personally reviewed the radiological images as listed and agreed with the findings in the report. No results found.  ASSESSMENT & PLAN:   Carcinoma of upper-outer quadrant of right breast in female, estrogen receptor negative ([place]) # Stage I triple negative breast cancer on adjuvant chemotherapy with s/p 4 cycles of AC; Taxol s/p 6 treatments. DISCONTINUED further Taxol chemotherapy given worsening neuropathy. STABLE; DEC 2022- UNI LAT LEFT-MAMMO- WNL [Dr.cintron].   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  On Tramadol ER 200 mg; improvement noted but not resolved.  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 Toy Baker 2022]-hold off prophylactic BSO.   #ADD worsened with chemotherapy/ongoing fatigue.  ADA disability forms; signed.   # DISPOSITION:  # follow up as planned in 81  month MD; labs- cbc/cmp-Dr.B   All questions were answered. The patient/family knows to call the clinic with any problems, questions or concerns.     Cammie Sickle, MD 02/27/2022 10:41 PM

## 2022-02-27 NOTE — Progress Notes (Signed)
Patient here for oncology follow-up appointment, concerns of chronic neuropathy  

## 2022-02-27 NOTE — Assessment & Plan Note (Addendum)
#   Stage I triple negative breast cancer on adjuvant chemotherapy with s/p 4 cycles of AC; Taxol s/p 6 treatments. DISCONTINUED further Taxol chemotherapy given worsening neuropathy. STABLE; DEC 2022- UNI LAT LEFT-MAMMO- WNL [Dr.cintron].   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  On Tramadol ER 200 mg; improvement noted but not resolved.  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 Toy Baker 2022]-hold off prophylactic BSO.   #ADD worsened with chemotherapy/ongoing fatigue.  ADA disability forms; signed.   # DISPOSITION:  # follow up as planned in 47  month MD; labs- cbc/cmp-Dr.B

## 2022-03-07 ENCOUNTER — Telehealth: Payer: Self-pay | Admitting: Student in an Organized Health Care Education/Training Program

## 2022-03-07 NOTE — Telephone Encounter (Signed)
Patient notified per voicemail that PA for Tramadol ER has been submitted.

## 2022-03-07 NOTE — Telephone Encounter (Signed)
Patient called her pharmacy to fill scripts and was told they need prior auth. Please notify when this PA gets done

## 2022-03-09 ENCOUNTER — Ambulatory Visit (INDEPENDENT_AMBULATORY_CARE_PROVIDER_SITE_OTHER): Payer: BC Managed Care – PPO | Admitting: Plastic Surgery

## 2022-03-09 ENCOUNTER — Encounter: Payer: Self-pay | Admitting: Plastic Surgery

## 2022-03-09 DIAGNOSIS — Z9011 Acquired absence of right breast and nipple: Secondary | ICD-10-CM

## 2022-03-09 DIAGNOSIS — C50411 Malignant neoplasm of upper-outer quadrant of right female breast: Secondary | ICD-10-CM | POA: Diagnosis not present

## 2022-03-09 DIAGNOSIS — Z171 Estrogen receptor negative status [ER-]: Secondary | ICD-10-CM

## 2022-03-09 NOTE — Progress Notes (Signed)
   Subjective:    Patient ID: Beth Wells, female    DOB: [birth year], 46 y.o.   MRN: [id number]  The patient is a 46 year old female here for evaluation of her breasts.She underwent right-sided mastectomy with reconstruction starting in November 2021.  She had exchange of the right breast expander to an implant in June 2022.  She has a Product manager smooth round high-profile 275 cc expander.  She also has a left breast mastopexy for symmetry.  She has a ridge on the right breast right at the pectoralis major muscle and the ADM.  I would like to do fat grafting to minimize the ridge.  We had not done it previously because of her weight and lack of fat.  She is 95 pounds today which is the same weight she was at her last visit.  She states she has been trying to gain weight and has not been able to do it.  I made some recommendations protein drinks that might help.    Review of Systems  Constitutional: Negative.   Eyes: Negative.   Respiratory: Negative.  Negative for chest tightness.   Cardiovascular: Negative.  Negative for leg swelling.  Gastrointestinal: Negative.   Endocrine: Negative.   Genitourinary: Negative.   Musculoskeletal: Negative.        Objective:   Physical Exam Vitals reviewed.  Constitutional:      Appearance: Normal appearance.  HENT:     Head: Normocephalic and atraumatic.  Cardiovascular:     Rate and Rhythm: Normal rate.     Pulses: Normal pulses.  Pulmonary:     Effort: Pulmonary effort is normal.  Abdominal:     General: There is no distension.     Palpations: Abdomen is soft.  Musculoskeletal:        General: No swelling or deformity.  Skin:    General: Skin is warm.     Capillary Refill: Capillary refill takes less than 2 seconds.     Coloration: Skin is not jaundiced.  Neurological:     Mental Status: She is oriented to person, place, and time.  Psychiatric:        Mood and Affect: Mood normal.        Behavior: Behavior normal.        Thought Content:  Thought content normal.        Judgment: Judgment normal.       Assessment & Plan:     ICD-10-CM   1. Carcinoma of upper-outer quadrant of right breast in female, estrogen receptor negative ([place])  C50.411    Z17.1     2. Acquired absence of right breast  Z90.11        I willing to do the fat grafting and think she would have a nice result if she can get some more fat.  The only place I see the viable is posterior legs which would just be logistically slightly difficult for the surgery.  She is going to keep working on her weight increase and come and see [place] in 3 to 6 months.  Pictures were obtained of the patient and placed in the chart with the patient's or guardian's permission.

## 2022-03-10 ENCOUNTER — Telehealth: Payer: BC Managed Care – PPO | Admitting: Plastic Surgery

## 2022-04-17 ENCOUNTER — Other Ambulatory Visit: Payer: Self-pay

## 2022-05-16 ENCOUNTER — Encounter: Payer: Self-pay | Admitting: Student in an Organized Health Care Education/Training Program

## 2022-05-16 ENCOUNTER — Ambulatory Visit
Payer: BC Managed Care – PPO | Attending: Student in an Organized Health Care Education/Training Program | Admitting: Student in an Organized Health Care Education/Training Program

## 2022-05-16 VITALS — BP 122/86 | HR 88 | Temp 97.1°F | Resp 16 | Ht 62.0 in | Wt 96.0 lb

## 2022-05-16 DIAGNOSIS — C50411 Malignant neoplasm of upper-outer quadrant of right female breast: Secondary | ICD-10-CM | POA: Insufficient documentation

## 2022-05-16 DIAGNOSIS — G629 Polyneuropathy, unspecified: Secondary | ICD-10-CM | POA: Diagnosis not present

## 2022-05-16 DIAGNOSIS — G603 Idiopathic progressive neuropathy: Secondary | ICD-10-CM | POA: Diagnosis not present

## 2022-05-16 DIAGNOSIS — G608 Other hereditary and idiopathic neuropathies: Secondary | ICD-10-CM | POA: Insufficient documentation

## 2022-05-16 DIAGNOSIS — R202 Paresthesia of skin: Secondary | ICD-10-CM | POA: Diagnosis not present

## 2022-05-16 DIAGNOSIS — G894 Chronic pain syndrome: Secondary | ICD-10-CM | POA: Diagnosis not present

## 2022-05-16 DIAGNOSIS — Z171 Estrogen receptor negative status [ER-]: Secondary | ICD-10-CM | POA: Insufficient documentation

## 2022-05-16 MED ORDER — TRAMADOL HCL ER 200 MG PO TB24: 200.0000 mg | ORAL_TABLET | Freq: Every day | ORAL | 2 refills | Status: DC

## 2022-05-16 MED ORDER — TRAMADOL HCL 50 MG PO TABS: 50.0000 mg | ORAL_TABLET | Freq: Two times a day (BID) | ORAL | 1 refills | Status: AC | PRN

## 2022-05-16 NOTE — Patient Instructions (Signed)

## 2022-05-16 NOTE — Progress Notes (Signed)
Nursing Pain Medication Assessment:  Safety precautions to be maintained throughout the outpatient stay will include: orient to surroundings, keep bed in low position, maintain call bell within reach at all times, provide assistance with transfer out of bed and ambulation.  Medication Inspection Compliance: Pill count conducted under aseptic conditions, in front of the patient. Neither the pills nor the bottle was removed from the patient's sight at any time. Once count was completed pills were immediately returned to the patient in their original bottle.  Medication: Tramadol (Ultram) Pill/Patch Count:  22 of 60 pills remain Pill/Patch Appearance: Markings consistent with prescribed medication Bottle Appearance: Standard pharmacy container. Clearly labeled. Filled Date: 03 / 30 / 2023 Last Medication intake:  Day before yesterday  Tramadol 200 mg 23/30 Filled 05-14-2022 Last took today

## 2022-05-16 NOTE — Progress Notes (Signed)
PROVIDER NOTE: Information contained herein reflects review and annotations entered in association with encounter. Interpretation of such information and data should be left to medically-trained personnel. Information provided to patient can be located elsewhere in the medical record under "Patient Instructions". Document created using STT-dictation technology, any transcriptional errors that may result from process are unintentional.    Patient: Beth Wells  Service Category: E/M  Provider: Gillis Santa, MD  DOB: [birth year]  DOS: 05/16/2022  Specialty: Interventional Pain Management  MRN: [id number]  Setting: Ambulatory outpatient  PCP: Elza Rafter, MD  Type: Established Patient    Referring Provider: Elza Rafter, *  Location: Office  Delivery: Face-to-face     HPI  [name], a 46 y.o. year old female, is here today because of her Chronic pain syndrome [G89.4]. [name] primary complain today is Foot Pain, Back Pain, and Neck Pain  Last encounter: My last encounter with her was on 02/07/2022  Pertinent problems: [name] has Small fiber neuropathy; Paresthesias; and Chronic pain syndrome on their pertinent problem list. Pain Assessment: Severity of Chronic pain is reported as a 7 /10. Location: Foot Right, Left/ . Onset: More than a month ago. Quality: Burning, Tingling. Timing: Constant. Modifying factor(s): meds. Vitals:  height is '5\' 2"'  (1.575 m) and weight is 96 lb (43.5 kg). Her temperature is 97.1 F (36.2 C) (abnormal). Her blood pressure is 122/86 and her pulse is 88. Her respiration is 16 and oxygen saturation is 100%.   Reason for encounter: medication management.    Patient presents today for medication management.  She is having a flareup of her lower extremity neuropathic pain and is also having increased pain in her low lumbar and thoracic spine.  She thinks that this is related to chemotherapy.  She is hoping to establish with a new neurologist at  [facility].  She inquired about spinal cord stimulation therapy.  We spent some time talking about spinal cord stimulation, procedural details, potential benefits and associated risks.  She states that she will think about this further.  I informed her that she will need a psych eval if we are to pursue SCS trial.  In the interim, we will refill tramadol extended release and IR as below.  She is utilizing less immediate release tramadol as she has experience better pain relief with the Tramadol ER 200.   Pharmacotherapy Assessment  Analgesic: tramadol 200 mg ER, continue tramadol IR 50 mg twice daily as needed for breakthrough pain    Monitoring: [place] PMP: PDMP reviewed during this encounter.       Pharmacotherapy: No side-effects or adverse reactions reported. Compliance: No problems identified. Effectiveness: Clinically acceptable.  UDS:  Summary  Date Value Ref Range Status  03/22/2021 Note  Final    Comment:    ==================================================================== Tox[employer]SU[employer] Select 13 (MW) ==================================================================== Test                             Result       Flag       Units  Drug Present and Declared for Prescription Verification   Amphetamine                    >5263        EXPECTED   ng/mg creat    Amphetamine is available as a schedule II prescription drug.    Tramadol                       >  2632        EXPECTED   ng/mg creat   O-Desmethyltramadol            >2632        EXPECTED   ng/mg creat   N-Desmethyltramadol            >2632        EXPECTED   ng/mg creat    Source of tramadol is a prescription medication. O-desmethyltramadol    and N-desmethyltramadol are expected metabolites of tramadol.  ==================================================================== Test                      Result    Flag   Units      Ref Range   Creatinine              190              mg/dL       >=20 ==================================================================== Declared Medications:  The flagging and interpretation on this report are based on the  following declared medications.  Unexpected results may arise from  inaccuracies in the declared medications.   **Note: The testing scope of this panel includes these medications:   Amphetamine (Adderall)  Tramadol (Ultram)   **Note: The testing scope of this panel does not include the  following reported medications:   Lisinopril (Zestril)  Pregabalin (Lyrica)  Trolamine Salicylate (topical) (Aspercreme) ==================================================================== For clinical consultation, please call [phone]. ====================================================================      ROS  Constitutional: Denies any fever or chills Gastrointestinal: No reported hemesis, hematochezia, vomiting, or acute GI distress Musculoskeletal: Denies any acute onset joint swelling, redness, loss of ROM, or weakness Neurological:  Lower extremity neuropathic pain, allodynia  Medication Review  Alpha-Lipoic Acid, Probiotic Product, amphetamine-dextroamphetamine, capsicum, estradiol, pregabalin, traMADol, traZODone, and trolamine salicylate  History Review  Allergy: [name] has No Known Allergies. Drug: [name]  reports no history of drug use. Alcohol:  reports that she does not currently use alcohol. Tobacco:  reports that she has never smoked. She has never used smokeless tobacco. Social: [name]  reports that she has never smoked. She has never used smokeless tobacco. She reports that she does not currently use alcohol. She reports that she does not use drugs. Medical:  has a past medical history of Breast cancer ([place]) (08/05/2020), Carcinoma of upper-outer quadrant of right breast in female, estrogen receptor negative ([place]) (25/95/6387), Complication of anesthesia, H/O foot surgery (08/2018), History of kidney  stones, Hypertension, and PONV (postoperative nausea and vomiting). Surgical: [name]  has a past surgical history that includes Kidney stone surgery; Cervical cone biopsy; Dilation and evacuation (N/A, 03/12/2018); laparoscopy (N/A, 06/07/2019); Laparoscopic ovarian cystectomy (Right, 06/07/2019); Breast biopsy (Right, 07/06/2020); Bunionectomy (Right); Total mastectomy (Right, 08/05/2020); Portacath placement (Left, 08/05/2020); Breast reconstruction with placement of tissue expander and flex hd (acellular hydrated dermis) (Right, 08/05/2020); Removal of tissue expander and placement of implant (Right, 03/09/2021); Mastopexy (Left, 03/09/2021); Reconstruction breast immediate / delayed w/ tissue expander (Right, 03/09/2021); Mastectomy (Right, 08/05/2020); and Breast reduction with mastopexy. Family: family history includes Cancer in an other family member.  Laboratory Chemistry Profile   Renal Lab Results  Component Value Date   BUN 31 (H) 02/27/2022   CREATININE 0.88 02/27/2022   GFRAA >60 06/07/2019   GFRNONAA >60 02/27/2022     Hepatic Lab Results  Component Value Date   AST 18 02/27/2022   ALT 13 02/27/2022   ALBUMIN 4.0 02/27/2022   ALKPHOS 47  02/27/2022     Electrolytes Lab Results  Component Value Date   NA 134 (L) 02/27/2022   K 4.3 02/27/2022   CL 102 02/27/2022   CALCIUM 8.7 (L) 02/27/2022     Bone No results found for: "VD25OH", "VD125OH2TOT", "TK2409BD5", "HG9924QA8", "25OHVITD1", "25OHVITD2", "25OHVITD3", "TESTOFREE", "TESTOSTERONE"   Inflammation (CRP: Acute Phase) (ESR: Chronic Phase) Lab Results  Component Value Date   LATICACIDVEN 1.4 10/18/2020       Note: Above Lab results reviewed.  Recent Imaging Review  MM DIAG BREAST TOMO UNI LEFT CLINICAL DATA:  46 year old who underwent malignant RIGHT mastectomy in November, 2021, with LEFT breast mastopexy at that time. She presents with intermittent diffuse LEFT breast pain. Recent Port-A-Cath removal  from the LEFT chest wall.  EXAM: DIGITAL DIAGNOSTIC UNILATERAL LEFT MAMMOGRAM WITH TOMOSYNTHESIS AND CAD  TECHNIQUE: Left digital diagnostic mammography and breast tomosynthesis was performed. The images were evaluated with computer-aided detection.  COMPARISON:  Previous exam(s).  ACR Breast Density Category c: The breast tissue is heterogeneously dense, which may obscure small masses.  FINDINGS: Full field CC and MLO views and a spot compression view of a mammographic finding in the upper breast at posterior depth were performed.  An asymmetry in the upper breast at posterior depth on the full field MLO view is shown on the spot compression view to represent post surgical changes at the site of the Port-A-Cath removal, as the scar at the Port-A-Cath removal site was marked with an opaque linear marker.  No findings suspicious for malignancy.  IMPRESSION: No mammographic evidence of malignancy involving the LEFT breast.  RECOMMENDATION: Screening LEFT mammogram in one year.(Code:SM-B-01Y)  I have discussed the findings and recommendations with the patient. If applicable, a reminder letter will be sent to the patient regarding the next appointment.  BI-RADS CATEGORY  2: Benign.  Electronically Signed   By: Evangeline Dakin M.D.   On: 08/30/2021 11:50  Note: Reviewed        Physical Exam  General appearance: Well nourished, well developed, and well hydrated. In no apparent acute distress Mental status: Alert, oriented x 3 (person, place, & time)       Respiratory: No evidence of acute respiratory distress Eyes: PERLA Vitals: BP 122/86   Pulse 88   Temp (!) 97.1 F (36.2 C)   Resp 16   Ht '5\' 2"'  (1.575 m)   Wt 96 lb (43.5 kg)   SpO2 100%   BMI 17.56 kg/m  BMI: Estimated body mass index is 17.56 kg/m as calculated from the following:   Height as of this encounter: '5\' 2"'  (1.575 m).   Weight as of this encounter: 96 lb (43.5 kg). Ideal: Female patients must  weigh at least 45.5 kg to calculate ideal body weight  Neurogenic pain pattern of lower extremity, allodynia noted  5 out of 5 strength bilateral lower extremity: Plantar flexion, dorsiflexion, knee flexion, knee extension.   Assessment   Diagnosis  1. Chronic pain syndrome   2. Small fiber neuropathy   3. Idiopathic progressive neuropathy   4. Paresthesias   5. Idiopathic small fiber sensory neuropathy   6. Carcinoma of upper-outer quadrant of right breast in female, estrogen receptor negative ([place])       Plan of Care  [name] has a current medication list which includes the following long-term medication(s): amphetamine-dextroamphetamine, pregabalin, and trazodone.  Pharmacotherapy (Medications Ordered): Meds ordered this encounter  Medications   traMADol (ULTRAM) 50 MG tablet    Sig: Take  1 tablet (50 mg total) by mouth every 12 (twelve) hours as needed.    Dispense:  60 tablet    Refill:  1   traMADol (ULTRAM-ER) 200 MG 24 hr tablet    Sig: Take 1 tablet (200 mg total) by mouth daily.    Dispense:  30 tablet    Refill:  2    For chronic pain syndrome   Consider Qutenza in future Discussed SCS therapy with pt.   Follow-up plan:   Return in about 3 months (around 08/16/2022) for Medication Management, in person.   Recent Visits No visits were found meeting these conditions. Showing recent visits within past 90 days and meeting all other requirements Today's Visits Date Type Provider Dept  05/16/22 Office Visit Gillis Santa, MD [facility]  Showing today's visits and meeting all other requirements Future Appointments Date Type Provider Dept  08/10/22 Appointment Gillis Santa, MD [facility]  Showing future appointments within next 90 days and meeting all other requirements  I discussed the assessment and treatment plan with the patient. The patient was provided an opportunity to ask questions and all were answered. The patient  agreed with the plan and demonstrated an understanding of the instructions.  Patient advised to call back or seek an in-person evaluation if the symptoms or condition worsens.  Duration of encounter: 30 minutes.  Note by: Gillis Santa, MD Date: 05/16/2022; Time: 2:53 PM

## 2022-08-07 ENCOUNTER — Other Ambulatory Visit: Payer: Self-pay | Admitting: General Surgery

## 2022-08-07 DIAGNOSIS — Z1231 Encounter for screening mammogram for malignant neoplasm of breast: Secondary | ICD-10-CM

## 2022-08-10 ENCOUNTER — Ambulatory Visit
Payer: BC Managed Care – PPO | Attending: Student in an Organized Health Care Education/Training Program | Admitting: Student in an Organized Health Care Education/Training Program

## 2022-08-10 ENCOUNTER — Encounter: Payer: Self-pay | Admitting: Student in an Organized Health Care Education/Training Program

## 2022-08-10 VITALS — BP 130/89 | HR 99 | Temp 97.5°F | Ht 62.0 in | Wt 96.0 lb

## 2022-08-10 DIAGNOSIS — G608 Other hereditary and idiopathic neuropathies: Secondary | ICD-10-CM | POA: Diagnosis present

## 2022-08-10 DIAGNOSIS — C50411 Malignant neoplasm of upper-outer quadrant of right female breast: Secondary | ICD-10-CM | POA: Insufficient documentation

## 2022-08-10 DIAGNOSIS — G603 Idiopathic progressive neuropathy: Secondary | ICD-10-CM | POA: Diagnosis present

## 2022-08-10 DIAGNOSIS — G629 Polyneuropathy, unspecified: Secondary | ICD-10-CM | POA: Diagnosis present

## 2022-08-10 DIAGNOSIS — Z171 Estrogen receptor negative status [ER-]: Secondary | ICD-10-CM | POA: Diagnosis present

## 2022-08-10 DIAGNOSIS — R202 Paresthesia of skin: Secondary | ICD-10-CM | POA: Diagnosis present

## 2022-08-10 DIAGNOSIS — G894 Chronic pain syndrome: Secondary | ICD-10-CM | POA: Diagnosis present

## 2022-08-10 MED ORDER — PREGABALIN 100 MG PO CAPS: 100.0000 mg | ORAL_CAPSULE | Freq: Two times a day (BID) | ORAL | 1 refills | Status: DC

## 2022-08-10 MED ORDER — TRAMADOL HCL ER 200 MG PO TB24: 200.0000 mg | ORAL_TABLET | Freq: Every day | ORAL | 2 refills | Status: DC

## 2022-08-10 NOTE — Progress Notes (Signed)
PROVIDER NOTE: Information contained herein reflects review and annotations entered in association with encounter. Interpretation of such information and data should be left to medically-trained personnel. Information provided to patient can be located elsewhere in the medical record under "Patient Instructions". Document created using STT-dictation technology, any transcriptional errors that may result from process are unintentional.    Patient: Beth Wells  Service Category: E/M  Provider: Gillis Santa, MD  DOB: 1976-08-29  DOS: 08/10/2022  Specialty: Interventional Pain Management  MRN: [id number]  Setting: Ambulatory outpatient  PCP: Elza Rafter, MD  Type: Established Patient    Referring Provider: Elza Rafter, *  Location: Office  Delivery: Face-to-face     HPI  [name], a 46 y.o. year old female, is here today because of her Chronic pain syndrome [G89.4]. [name] primary complain today is Generalized Body Aches  Last encounter: My last encounter with her was on 05/16/22  Pertinent problems: Beth Wells has Small fiber neuropathy; Paresthesias; and Chronic pain syndrome on their pertinent problem list. Pain Assessment: Severity of Chronic pain is reported as a 5 /10. Location: Generalized Left, Right, Upper, Lower/pain radiaities everywhere. Onset: More than a month ago. Quality: Burning, Constant, Tingling. Timing: Constant. Modifying factor(s): heat, meds, cream, TEN's. Vitals:  height is _0  (1.575 m) and weight is 96 lb (43.5 kg). Her temperature is 97.5 F (36.4 C) (abnormal). Her blood pressure is 130/89 and her pulse is 99. Her oxygen saturation is 100%.   Reason for encounter: medication management.    Patient presents today for medication management.  No significant change in her medical history.  She saw neurology and she has an EMG/nerve conduction velocity scheduled for tomorrow.  She continues Lyrica 100 mg twice a day.  She is concerned about  dose escalation given risk of sedation.  She continues tramadol 20 mg ER.   Pharmacotherapy Assessment  Analgesic: tramadol 200 mg ER    Monitoring: [place] PMP: PDMP reviewed during this encounter.       Pharmacotherapy: No side-effects or adverse reactions reported. Compliance: No problems identified. Effectiveness: Clinically acceptable.  UDS:  Summary  Date Value Ref Range Status  03/22/2021 Note  Final    Comment:    ==================================================================== ToxASSURE Select 13 (MW) ==================================================================== Test                             Result       Flag       Units  Drug Present and Declared for Prescription Verification   Amphetamine                    >5263        EXPECTED   ng/mg creat    Amphetamine is available as a schedule II prescription drug.    Tramadol                       >2632        EXPECTED   ng/mg creat   O-Desmethyltramadol            >2632        EXPECTED   ng/mg creat   N-Desmethyltramadol            >2632        EXPECTED   ng/mg creat    Source of tramadol is a prescription medication. O-desmethyltramadol    and N-desmethyltramadol are expected metabolites of tramadol.  ====================================================================  Test                      Result    Flag   Units      Ref Range   Creatinine              190              mg/dL      >=20 ==================================================================== Declared Medications:  The flagging and interpretation on this report are based on the  following declared medications.  Unexpected results may arise from  inaccuracies in the declared medications.   **Note: The testing scope of this panel includes these medications:   Amphetamine (Adderall)  Tramadol (Ultram)   **Note: The testing scope of this panel does not include the  following reported medications:   Lisinopril (Zestril)  Pregabalin (Lyrica)   Trolamine Salicylate (topical) (Aspercreme) ==================================================================== For clinical consultation, please call [phone]. ====================================================================      ROS  Constitutional: Denies any fever or chills Gastrointestinal: No reported hemesis, hematochezia, vomiting, or acute GI distress Musculoskeletal: Denies any acute onset joint swelling, redness, loss of ROM, or weakness Neurological:  Lower extremity neuropathic pain, allodynia  Medication Review  Alpha-Lipoic Acid, Probiotic Product, amphetamine-dextroamphetamine, capsicum, estradiol, pregabalin, traMADol, traZODone, and trolamine salicylate  History Review  Allergy: Beth Wells has No Known Allergies. Drug: Beth Wells  reports no history of drug use. Alcohol:  reports that she does not currently use alcohol. Tobacco:  reports that she has never smoked. She has never used smokeless tobacco. Social: Beth Wells  reports that she has never smoked. She has never used smokeless tobacco. She reports that she does not currently use alcohol. She reports that she does not use drugs. Medical:  has a past medical history of Breast cancer ([place]) (08/05/2020), Carcinoma of upper-outer quadrant of right breast in female, estrogen receptor negative ([place]) (67/61/9509), Complication of anesthesia, H/O foot surgery (08/2018), History of kidney stones, Hypertension, and PONV (postoperative nausea and vomiting). Surgical: Beth Wells  has a past surgical history that includes Kidney stone surgery; Cervical cone biopsy; Dilation and evacuation (N/A, 03/12/2018); laparoscopy (N/A, 06/07/2019); Laparoscopic ovarian cystectomy (Right, 06/07/2019); Breast biopsy (Right, 07/06/2020); Bunionectomy (Right); Total mastectomy (Right, 08/05/2020); Portacath placement (Left, 08/05/2020); Breast reconstruction with placement of tissue expander and flex hd (acellular hydrated dermis) (Right,  08/05/2020); Removal of tissue expander and placement of implant (Right, 03/09/2021); Mastopexy (Left, 03/09/2021); Reconstruction breast immediate / delayed w/ tissue expander (Right, 03/09/2021); Mastectomy (Right, 08/05/2020); and Breast reduction with mastopexy. Family: family history includes Cancer in an other family member.  Laboratory Chemistry Profile   Renal Lab Results  Component Value Date   BUN 31 (H) 02/27/2022   CREATININE 0.88 02/27/2022   GFRAA >60 06/07/2019   GFRNONAA >60 02/27/2022     Hepatic Lab Results  Component Value Date   AST 18 02/27/2022   ALT 13 02/27/2022   ALBUMIN 4.0 02/27/2022   ALKPHOS 47 02/27/2022     Electrolytes Lab Results  Component Value Date   NA 134 (L) 02/27/2022   K 4.3 02/27/2022   CL 102 02/27/2022   CALCIUM 8.7 (L) 02/27/2022     Bone No results found for: "VD25OH", "VD125OH2TOT", "TO6712WP8", "KD9833AS5", "25OHVITD1", "25OHVITD2", "25OHVITD3", "TESTOFREE", "TESTOSTERONE"   Inflammation (CRP: Acute Phase) (ESR: Chronic Phase) Lab Results  Component Value Date   LATICACIDVEN 1.4 10/18/2020       Note: Above Lab results reviewed.  Recent Imaging Review  MM DIAG  BREAST TOMO UNI LEFT CLINICAL DATA:  46 year old who underwent malignant RIGHT mastectomy in November, 2021, with LEFT breast mastopexy at that time. She presents with intermittent diffuse LEFT breast pain. Recent Port-A-Cath removal from the LEFT chest wall.  EXAM: DIGITAL DIAGNOSTIC UNILATERAL LEFT MAMMOGRAM WITH TOMOSYNTHESIS AND CAD  TECHNIQUE: Left digital diagnostic mammography and breast tomosynthesis was performed. The images were evaluated with computer-aided detection.  COMPARISON:  Previous exam(s).  ACR Breast Density Category c: The breast tissue is heterogeneously dense, which may obscure small masses.  FINDINGS: Full field CC and MLO views and a spot compression view of a mammographic finding in the upper breast at posterior depth  were performed.  An asymmetry in the upper breast at posterior depth on the full field MLO view is shown on the spot compression view to represent post surgical changes at the site of the Port-A-Cath removal, as the scar at the Port-A-Cath removal site was marked with an opaque linear marker.  No findings suspicious for malignancy.  IMPRESSION: No mammographic evidence of malignancy involving the LEFT breast.  RECOMMENDATION: Screening LEFT mammogram in one year.(Code:SM-B-01Y)  I have discussed the findings and recommendations with the patient. If applicable, a reminder letter will be sent to the patient regarding the next appointment.  BI-RADS CATEGORY  2: Benign.  Electronically Signed   By: Evangeline Dakin M.D.   On: 08/30/2021 11:50  Note: Reviewed        Physical Exam  General appearance: Well nourished, well developed, and well hydrated. In no apparent acute distress Mental status: Alert, oriented x 3 (person, place, & time)       Respiratory: No evidence of acute respiratory distress Eyes: PERLA Vitals: BP 130/89   Pulse 99   Temp (!) 97.5 F (36.4 C)   Ht _0  (1.575 m)   Wt 96 lb (43.5 kg)   SpO2 100%   BMI 17.56 kg/m  BMI: Estimated body mass index is 17.56 kg/m as calculated from the following:   Height as of this encounter: _1  (1.575 m).   Weight as of this encounter: 96 lb (43.5 kg). Ideal: Female patients must weigh at least 45.5 kg to calculate ideal body weight  Neurogenic pain pattern of lower extremity, allodynia noted  5 out of 5 strength bilateral lower extremity: Plantar flexion, dorsiflexion, knee flexion, knee extension.   Assessment   Diagnosis  1. Chronic pain syndrome   2. Small fiber neuropathy   3. Idiopathic progressive neuropathy   4. Paresthesias   5. Idiopathic small fiber sensory neuropathy   6. Carcinoma of upper-outer quadrant of right breast in female, estrogen receptor negative ([place])       Plan of Care  Ms.  Wells Wells has a current medication list which includes the following long-term medication(s): amphetamine-dextroamphetamine, trazodone, and pregabalin.  Pharmacotherapy (Medications Ordered): Meds ordered this encounter  Medications   traMADol (ULTRAM-ER) 200 MG 24 hr tablet    Sig: Take 1 tablet (200 mg total) by mouth daily.    Dispense:  30 tablet    Refill:  2    For chronic pain syndrome   pregabalin (LYRICA) 100 MG capsule    Sig: Take 1 capsule (100 mg total) by mouth 2 (two) times daily.    Dispense:  180 capsule    Refill:  1    Fill one day early if pharmacy is closed on scheduled refill date. May substitute for generic if available.   Consider Qutenza in future Discussed  SCS therapy with pt.   Follow-up plan:   Return in about 3 months (around 11/10/2022) for Medication Management, in person.   Recent Visits Date Type Provider Dept  05/16/22 Office Visit Gillis Santa, MD [facility]  Showing recent visits within past 90 days and meeting all other requirements Today's Visits Date Type Provider Dept  08/10/22 Office Visit Gillis Santa, MD [facility]  Showing today's visits and meeting all other requirements Future Appointments Date Type Provider Dept  11/07/22 Appointment Gillis Santa, MD [facility]  Showing future appointments within next 90 days and meeting all other requirements  I discussed the assessment and treatment plan with the patient. The patient was provided an opportunity to ask questions and all were answered. The patient agreed with the plan and demonstrated an understanding of the instructions.  Patient advised to call back or seek an in-person evaluation if the symptoms or condition worsens.  Duration of encounter: 30 minutes.  Note by: Gillis Santa, MD Date: 08/10/2022; Time: 3:07 PM

## 2022-08-10 NOTE — Progress Notes (Signed)
Nursing Pain Medication Assessment:  Safety precautions to be maintained throughout the outpatient stay will include: orient to surroundings, keep bed in low position, maintain call bell within reach at all times, provide assistance with transfer out of bed and ambulation.  Medication Inspection Compliance: Beth Wells did not comply with our request to bring her pills to be counted. She was reminded that bringing the medication bottles, even when empty, is a requirement.  Medication: None brought in. Pill/Patch Count: None available to be counted. Bottle Appearance: No container available. Did not bring bottle(s) to appointment. Filled Date: N/A Last Medication intake:  TodaySafety precautions to be maintained throughout the outpatient stay will include: orient to surroundings, keep bed in low position, maintain call bell within reach at all times, provide assistance with transfer out of bed and ambulation.

## 2022-08-16 LAB — TOXASSURE SELECT 13 (MW), URINE

## 2022-08-31 ENCOUNTER — Ambulatory Visit
Admission: RE | Admit: 2022-08-31 | Discharge: 2022-08-31 | Disposition: A | Discharge status: Home / Self Care | Payer: BC Managed Care – PPO | Source: Ambulatory Visit | Attending: General Surgery | Admitting: General Surgery

## 2022-08-31 DIAGNOSIS — Z1231 Encounter for screening mammogram for malignant neoplasm of breast: Secondary | ICD-10-CM | POA: Insufficient documentation

## 2022-09-05 ENCOUNTER — Ambulatory Visit: Payer: BC Managed Care – PPO | Admitting: Plastic Surgery

## 2022-11-07 ENCOUNTER — Ambulatory Visit
Payer: BC Managed Care – PPO | Attending: Student in an Organized Health Care Education/Training Program | Admitting: Student in an Organized Health Care Education/Training Program

## 2022-11-07 ENCOUNTER — Encounter: Payer: Self-pay | Admitting: Student in an Organized Health Care Education/Training Program

## 2022-11-07 VITALS — BP 139/96 | HR 91 | Temp 97.5°F | Resp 17 | Ht 62.0 in | Wt 91.0 lb

## 2022-11-07 DIAGNOSIS — G603 Idiopathic progressive neuropathy: Secondary | ICD-10-CM | POA: Insufficient documentation

## 2022-11-07 DIAGNOSIS — G629 Polyneuropathy, unspecified: Secondary | ICD-10-CM | POA: Insufficient documentation

## 2022-11-07 DIAGNOSIS — R202 Paresthesia of skin: Secondary | ICD-10-CM | POA: Diagnosis present

## 2022-11-07 DIAGNOSIS — G608 Other hereditary and idiopathic neuropathies: Secondary | ICD-10-CM | POA: Diagnosis present

## 2022-11-07 DIAGNOSIS — G894 Chronic pain syndrome: Secondary | ICD-10-CM | POA: Insufficient documentation

## 2022-11-07 MED ORDER — TRAMADOL HCL ER 200 MG PO TB24: 200.0000 mg | ORAL_TABLET | Freq: Every day | ORAL | 3 refills | Status: DC

## 2022-11-07 MED ORDER — PREGABALIN 100 MG PO CAPS: 100.0000 mg | ORAL_CAPSULE | Freq: Every day | ORAL | 1 refills | Status: DC

## 2022-11-07 NOTE — Progress Notes (Signed)
Nursing Pain Medication Assessment:  Safety precautions to be maintained throughout the outpatient stay will include: orient to surroundings, keep bed in low position, maintain call bell within reach at all times, provide assistance with transfer out of bed and ambulation.  Medication Inspection Compliance: Pill count conducted under aseptic conditions, in front of the patient. Neither the pills nor the bottle was removed from the patient's sight at any time. Once count was completed pills were immediately returned to the patient in their original bottle.  Medication: Tramadol (Ultram) Pill/Patch Count:  16 of 30 pills remain Pill/Patch Appearance: Markings consistent with prescribed medication Bottle Appearance: pharmacy label missing Filled Date: pharmacy label missing /  / Last Medication intake:  Today

## 2022-11-07 NOTE — Progress Notes (Signed)
PROVIDER NOTE: Information contained herein reflects review and annotations entered in association with encounter. Interpretation of such information and data should be left to medically-trained personnel. Information provided to patient can be located elsewhere in the medical record under "Patient Instructions". Document created using STT-dictation technology, any transcriptional errors that may result from process are unintentional.    Patient: Beth Wells  Service Category: E/M  Provider: Gillis Santa, MD  DOB: [birth year]  DOS: 11/07/2022  Specialty: Interventional Pain Management  MRN: [id number]  Setting: Ambulatory outpatient  PCP: Sherre Scarlet, PA-C  Type: Established Patient    Referring Provider: Elza Rafter, *  Location: Office  Delivery: Face-to-face     HPI  [name], a 47 y.o. year old female, is here today because of her Chronic pain syndrome [G89.4]. [name] primary complain today is Pain (Generalized body pain; always in legs; other locations vary day to day)  Last encounter: My last encounter with her was on 08/10/22  Pertinent problems: Ms. Natali has Small fiber neuropathy; Paresthesias; and Chronic pain syndrome on their pertinent problem list. Pain Assessment: Severity of Chronic pain is reported as a 7 /10. Location: Other (Comment) (legs, shoulders; "locations vary day to day")  / . Onset:  . Quality: Burning. Timing:  . Modifying factor(s): meds, moving. Vitals:  height is 5' 2"$  (1.575 m) and weight is 91 lb (41.3 kg). Her temporal temperature is 97.5 F (36.4 C) (abnormal). Her blood pressure is 139/96 (abnormal) and her pulse is 91. Her respiration is 17 and oxygen saturation is 100%.   Reason for encounter: medication management.    Patient presents today for medication management.  No significant change in her medical history.  She saw neurology and had a normal EMG/nerve conduction velocity study.  She takes 100 mg Lyrica.  She continues  tramadol 200 mg ER.   Pharmacotherapy Assessment  Analgesic: tramadol 200 mg ER    Monitoring: [place] PMP: PDMP reviewed during this encounter.       Pharmacotherapy: No side-effects or adverse reactions reported. Compliance: No problems identified. Effectiveness: Clinically acceptable.  UDS:  Summary  Date Value Ref Range Status  08/10/2022 Note  Final    Comment:    ==================================================================== [employer] [facility] 13 (MW) ==================================================================== Test                             Result       Flag       Units  Drug Present and Declared for Prescription Verification   Amphetamine                    >10204       EXPECTED   ng/mg creat    Amphetamine is available as a schedule II prescription drug.    Tramadol                       >5102        EXPECTED   ng/mg creat   O-Desmethyltramadol            >5102        EXPECTED   ng/mg creat   N-Desmethyltramadol            >5102        EXPECTED   ng/mg creat    Source of tramadol is a prescription medication. O-desmethyltramadol    and N-desmethyltramadol are expected metabolites of tramadol.  ====================================================================  Test                      Result    Flag   Units      Ref Range   Creatinine              98               mg/dL      >=20 ==================================================================== Declared Medications:  The flagging and interpretation on this report are based on the  following declared medications.  Unexpected results may arise from  inaccuracies in the declared medications.   **Note: The testing scope of this panel includes these medications:   Amphetamine (Adderall)  Tramadol (Ultram)   **Note: The testing scope of this panel does not include the  following reported medications:   Alpha Lipoic Acid (Alpha Lipoic Acid)  Capsaicin (Zostrix)  Estradiol (Estrace)  Pregabalin  (Lyrica)  Probiotic  Trazodone (Desyrel)  Trolamine Salicylate (topical) (Aspercreme) ==================================================================== For clinical consultation, please call [phone]. ====================================================================      ROS  Constitutional: Denies any fever or chills Gastrointestinal: No reported hemesis, hematochezia, vomiting, or acute GI distress Musculoskeletal: Denies any acute onset joint swelling, redness, loss of ROM, or weakness Neurological:  Lower extremity neuropathic pain, allodynia  Medication Review  Alpha-Lipoic Acid, Probiotic Product, amphetamine-dextroamphetamine, capsicum, estradiol, pregabalin, traMADol, traZODone, and trolamine salicylate  History Review  Allergy: Ms. Meins has No Known Allergies. Drug: Ms. Toller  reports no history of drug use. Alcohol:  reports that she does not currently use alcohol. Tobacco:  reports that she has never smoked. She has never used smokeless tobacco. Social: Ms. Rissler  reports that she has never smoked. She has never used smokeless tobacco. She reports that she does not currently use alcohol. She reports that she does not use drugs. Medical:  has a past medical history of Breast cancer ([place]) (08/05/2020), Carcinoma of upper-outer quadrant of right breast in female, estrogen receptor negative ([place]) ([id number]), Complication of anesthesia, H/O foot surgery (08/2018), History of kidney stones, Hypertension, and PONV (postoperative nausea and vomiting). Surgical: Ms. Costantino  has a past surgical history that includes Kidney stone surgery; Cervical cone biopsy; Dilation and evacuation (N/A, 03/12/2018); laparoscopy (N/A, 06/07/2019); Laparoscopic ovarian cystectomy (Right, 06/07/2019); Breast biopsy (Right, 07/06/2020); Bunionectomy (Right); Total mastectomy (Right, 08/05/2020); Portacath placement (Left, 08/05/2020); Breast reconstruction with placement of tissue expander and  flex hd (acellular hydrated dermis) (Right, 08/05/2020); Removal of tissue expander and placement of implant (Right, 03/09/2021); Mastopexy (Left, 03/09/2021); Reconstruction breast immediate / delayed w/ tissue expander (Right, 03/09/2021); Mastectomy (Right, 08/05/2020); and Breast reduction with mastopexy. Family: family history includes Cancer in an other family member.  Laboratory Chemistry Profile   Renal Lab Results  Component Value Date   BUN 31 (H) 02/27/2022   CREATININE 0.88 02/27/2022   GFRAA >60 06/07/2019   GFRNONAA >60 02/27/2022     Hepatic Lab Results  Component Value Date   AST 18 02/27/2022   ALT 13 02/27/2022   ALBUMIN 4.0 02/27/2022   ALKPHOS 47 02/27/2022     Electrolytes Lab Results  Component Value Date   NA 134 (L) 02/27/2022   K 4.3 02/27/2022   CL 102 02/27/2022   CALCIUM 8.7 (L) 02/27/2022     Bone No results found for: "VD25OH", "VD125OH2TOT", "[id number]", "[id number]", "25OHVITD1", "25OHVITD2", "25OHVITD3", "TESTOFREE", "TESTOSTERONE"   Inflammation (CRP: Acute Phase) (ESR: Chronic Phase) Lab Results  Component Value Date   LATICACIDVEN 1.4 10/18/2020  Note: Above Lab results reviewed.  Recent Imaging Review  MM 3D SCREEN BREAST UNI LEFT CLINICAL DATA:  Screening.  EXAM: DIGITAL SCREENING UNILATERAL LEFT MAMMOGRAM WITH CAD AND TOMOSYNTHESIS  TECHNIQUE: Left screening digital craniocaudal and mediolateral oblique mammograms were obtained. Left screening digital breast tomosynthesis was performed. The images were evaluated with computer-aided detection.  COMPARISON:  Previous exam(s).  ACR Breast Density Category b: There are scattered areas of fibroglandular density.  FINDINGS: There are no findings suspicious for malignancy.  IMPRESSION: No mammographic evidence of malignancy. A result letter of this screening mammogram will be mailed directly to the patient.  RECOMMENDATION: Screening mammogram in one year.  (Code:SM-B-01Y)  BI-RADS CATEGORY  1: Negative.  Electronically Signed   By: Marin Olp M.D.   On: 09/01/2022 10:27  Note: Reviewed        Physical Exam  General appearance: Well nourished, well developed, and well hydrated. In no apparent acute distress Mental status: Alert, oriented x 3 (person, place, & time)       Respiratory: No evidence of acute respiratory distress Eyes: PERLA Vitals: BP (!) 139/96   Pulse 91   Temp (!) 97.5 F (36.4 C) (Temporal)   Resp 17   Ht 5' 2"$  (1.575 m)   Wt 91 lb (41.3 kg)   LMP 04/08/2022 (Exact Date)   SpO2 100%   BMI 16.64 kg/m  BMI: Estimated body mass index is 16.64 kg/m as calculated from the following:   Height as of this encounter: 5' 2"$  (1.575 m).   Weight as of this encounter: 91 lb (41.3 kg). Ideal: Female patients must weigh at least 45.5 kg to calculate ideal body weight  Neurogenic pain pattern of lower extremity, allodynia noted  5 out of 5 strength bilateral lower extremity: Plantar flexion, dorsiflexion, knee flexion, knee extension.   Assessment   Diagnosis  1. Chronic pain syndrome   2. Small fiber neuropathy   3. Idiopathic progressive neuropathy   4. Paresthesias   5. Idiopathic small fiber sensory neuropathy        Plan of Care  Ms. Jaidence Wingrove has a current medication list which includes the following long-term medication(s): amphetamine-dextroamphetamine, trazodone, and pregabalin.  Pharmacotherapy (Medications Ordered): Meds ordered this encounter  Medications   traMADol (ULTRAM-ER) 200 MG 24 hr tablet    Sig: Take 1 tablet (200 mg total) by mouth daily.    Dispense:  30 tablet    Refill:  3    For chronic pain syndrome   pregabalin (LYRICA) 100 MG capsule    Sig: Take 1 capsule (100 mg total) by mouth daily.    Dispense:  180 capsule    Refill:  1    Fill one day early if pharmacy is closed on scheduled refill date. May substitute for generic if available.   Consider Qutenza in  future Discussed SCS therapy with pt.   Follow-up plan:   Return in about 4 months (around 03/15/2023) for Medication Management, in person.   Recent Visits Date Type Provider Dept  08/10/22 Office Visit Gillis Santa, MD [facility]  Showing recent visits within past 90 days and meeting all other requirements Today's Visits Date Type Provider Dept  11/07/22 Office Visit Gillis Santa, MD [facility]  Showing today's visits and meeting all other requirements Future Appointments No visits were found meeting these conditions. Showing future appointments within next 90 days and meeting all other requirements  I discussed the assessment and treatment plan with the patient.  The patient was provided an opportunity to ask questions and all were answered. The patient agreed with the plan and demonstrated an understanding of the instructions.  Patient advised to call back or seek an in-person evaluation if the symptoms or condition worsens.  Duration of encounter: 30 minutes.  Note by: Gillis Santa, MD Date: 11/07/2022; Time: 3:11 PM

## 2022-11-28 ENCOUNTER — Ambulatory Visit: Payer: BC Managed Care – PPO | Admitting: Plastic Surgery

## 2022-11-28 ENCOUNTER — Encounter: Payer: Self-pay | Admitting: Plastic Surgery

## 2022-11-28 VITALS — BP 157/101 | HR 82 | Ht 62.0 in | Wt 94.2 lb

## 2022-11-28 DIAGNOSIS — Z171 Estrogen receptor negative status [ER-]: Secondary | ICD-10-CM

## 2022-11-28 DIAGNOSIS — Z9011 Acquired absence of right breast and nipple: Secondary | ICD-10-CM

## 2022-11-28 DIAGNOSIS — N651 Disproportion of reconstructed breast: Secondary | ICD-10-CM | POA: Diagnosis not present

## 2022-11-28 DIAGNOSIS — C50411 Malignant neoplasm of upper-outer quadrant of right female breast: Secondary | ICD-10-CM | POA: Diagnosis not present

## 2022-11-28 NOTE — Progress Notes (Signed)
   Subjective:    Patient ID: Beth Wells, female    DOB: 1976/09/12, 47 y.o.   MRN: [id number]  Patient is a 47 year old female here for evaluation of her breasts.  In September 2021 she was seen for breast cancer after diagnostic mammogram and biopsy showed right invasive mammary carcinoma.  It was estrogen progesterone and HER2 negative.  She is 5 feet 2 inches tall and weighs around 100 pounds.  She underwent a right mastectomy with expander placement in November 2021.  She then had the expander removed and a 275 cc Mentor smooth round high-profile gel implant placed in June 2022.  She has some asymmetry with loss of volume on the lower portion of the right breast.  The contracture has improved but the symmetry has not.  She is interested in improved symmetry.  The challenges that she has little to no excess body fat.  She finds it very difficult to eat regularly and has not been able to increase her weight since the last time she saw me.  She is now 94 pounds.  She has seen her primary care physician about this as well.      Review of Systems  Constitutional: Negative.   Eyes: Negative.   Respiratory: Negative.  Negative for chest tightness and shortness of breath.   Cardiovascular: Negative.   Gastrointestinal: Negative.   Endocrine: Negative.   Genitourinary: Negative.   Musculoskeletal: Negative.        Objective:   Physical Exam Vitals and nursing note reviewed.  Constitutional:      Appearance: Normal appearance.  HENT:     Head: Atraumatic.  Cardiovascular:     Rate and Rhythm: Normal rate.     Pulses: Normal pulses.  Pulmonary:     Effort: Pulmonary effort is normal.  Abdominal:     Palpations: Abdomen is soft.  Skin:    General: Skin is warm.     Capillary Refill: Capillary refill takes less than 2 seconds.     Coloration: Skin is not jaundiced.     Findings: No bruising.  Neurological:     Mental Status: She is alert and oriented to person, place, and time.   Psychiatric:        Mood and Affect: Mood normal.        Behavior: Behavior normal.        Assessment & Plan:     ICD-10-CM   1. Carcinoma of upper-outer quadrant of right breast in female, estrogen receptor negative ([place])  C50.411    Z17.1     2. Acquired absence of right breast  Z90.11     3. Breast asymmetry following reconstructive surgery  N65.1       I think the patient's breast would do very well with fat grafting.  The challenges is that she does not have excess fat to give me at least not enough that would make a difference.  I have encouraged her to continue to try and eat and put on some weight and come back and see me in 6 to 9 months.  At the moment I cannot think of another realistic solution to get better symmetry.  I do not think a muscle flap would be the greatest idea for her but we did talk about it.  Pictures were obtained of the patient and placed in the chart with the patient's or guardian's permission.

## 2023-01-24 ENCOUNTER — Ambulatory Visit: Payer: BC Managed Care – PPO

## 2023-01-24 DIAGNOSIS — K635 Polyp of colon: Secondary | ICD-10-CM

## 2023-01-24 DIAGNOSIS — Z1211 Encounter for screening for malignant neoplasm of colon: Secondary | ICD-10-CM

## 2023-01-24 HISTORY — PX: COLONOSCOPY: SHX174

## 2023-02-28 ENCOUNTER — Inpatient Hospital Stay: Payer: BC Managed Care – PPO | Attending: Internal Medicine | Admitting: Internal Medicine

## 2023-02-28 ENCOUNTER — Encounter: Payer: Self-pay | Admitting: Internal Medicine

## 2023-02-28 ENCOUNTER — Inpatient Hospital Stay: Payer: BC Managed Care – PPO

## 2023-02-28 VITALS — BP 140/92 | HR 78 | Temp 97.2°F | Ht 62.0 in | Wt 97.0 lb

## 2023-02-28 DIAGNOSIS — Z171 Estrogen receptor negative status [ER-]: Secondary | ICD-10-CM | POA: Insufficient documentation

## 2023-02-28 DIAGNOSIS — G620 Drug-induced polyneuropathy: Secondary | ICD-10-CM | POA: Diagnosis not present

## 2023-02-28 DIAGNOSIS — R5383 Other fatigue: Secondary | ICD-10-CM | POA: Insufficient documentation

## 2023-02-28 DIAGNOSIS — D72819 Decreased white blood cell count, unspecified: Secondary | ICD-10-CM | POA: Insufficient documentation

## 2023-02-28 DIAGNOSIS — C50411 Malignant neoplasm of upper-outer quadrant of right female breast: Secondary | ICD-10-CM | POA: Diagnosis present

## 2023-02-28 DIAGNOSIS — Z800 Family history of malignant neoplasm of digestive organs: Secondary | ICD-10-CM | POA: Diagnosis not present

## 2023-02-28 LAB — COMPREHENSIVE METABOLIC PANEL
ALT: 17 U/L (ref 0–44)
AST: 18 U/L (ref 15–41)
Albumin: 4.4 g/dL (ref 3.5–5.0)
Alkaline Phosphatase: 44 U/L (ref 38–126)
Anion gap: 8 (ref 5–15)
BUN: 25 mg/dL — ABNORMAL HIGH (ref 6–20)
CO2: 28 mmol/L (ref 22–32)
Calcium: 9.3 mg/dL (ref 8.9–10.3)
Chloride: 100 mmol/L (ref 98–111)
Creatinine, Ser: 0.83 mg/dL (ref 0.44–1.00)
GFR, Estimated: >60 mL/min (ref >60)
Glucose, Bld: 128 mg/dL — ABNORMAL HIGH (ref 70–99)
Potassium: 4.4 mmol/L (ref 3.5–5.1)
Sodium: 136 mmol/L (ref 135–145)
Total Bilirubin: 0.4 mg/dL (ref 0.3–1.2)
Total Protein: 7.1 g/dL (ref 6.5–8.1)

## 2023-02-28 LAB — CBC WITH DIFFERENTIAL/PLATELET
Abs Immature Granulocytes: 0.01 10*3/uL (ref 0.00–0.07)
Basophils Absolute: 0 10*3/uL (ref 0.0–0.1)
Basophils Relative: 1 %
Eosinophils Absolute: 0.1 10*3/uL (ref 0.0–0.5)
Eosinophils Relative: 3 %
HCT: 36.4 % (ref 36.0–46.0)
Hemoglobin: 12.2 g/dL (ref 12.0–15.0)
Immature Granulocytes: 0 %
Lymphocytes Relative: 38 %
Lymphs Abs: 1.4 10*3/uL (ref 0.7–4.0)
MCH: 31 pg (ref 26.0–34.0)
MCHC: 33.5 g/dL (ref 30.0–36.0)
MCV: 92.6 fL (ref 80.0–100.0)
Monocytes Absolute: 0.4 10*3/uL (ref 0.1–1.0)
Monocytes Relative: 10 %
Neutro Abs: 1.8 10*3/uL (ref 1.7–7.7)
Neutrophils Relative %: 48 %
Platelets: 192 10*3/uL (ref 150–400)
RBC: 3.93 MIL/uL (ref 3.87–5.11)
RDW: 11.6 % (ref 11.5–15.5)
WBC: 3.7 10*3/uL — ABNORMAL LOW (ref 4.0–10.5)
nRBC: 0 % (ref 0.0–0.2)

## 2023-02-28 NOTE — Progress Notes (Signed)
[facility] CONSULT NOTE  Patient Care Team: Roslynn Amble as PCP - General (Physician Assistant) Earna Coder, [name] Edward Jolly, MD as Consulting Physician (Pain Medicine) Carolan Shiver, MD as Consulting Physician (General Surgery) Dillingham, Alena Bills, DO as Attending Physician (Plastic Surgery)  CHIEF COMPLAINTS/PURPOSE OF CONSULTATION: Breast cancer  #  Oncology History Overview Note  # OCT 2021-RIGHT BREAST- TRIPLE NEGATIVE IMC; [US/mammo-39mm]; Dr.Cintron; OCT 2021-USThere is a 7 mm mass in the right breast at 10 o'clock, favored to represent a complicated cyst;  No evidence of right axillary lymphadenopathy].   # NOV 2021- Stage I pT1bp N0 [s/p mastectomy; Dr.Cintron]; G-3;   # DEC 17th, 2021- AC x4; 11/29/2020- Taxol weekly [60mg /m2-3 W-ON & 1 W-OFF sec to PN]  # 2018-Small Fibre neuropathy [Dr.Shah]- skin biopsy- on cymblata+ Lyrica  # DEC 2021-status post evaluation with reproductive endocrinology [9-10% chance of viable pregnancy] # # SURVIVORSHIP:   # GENETICS: BARD  DIAGNOSIS: Breast cancer-triple negative  STAGE:  I       ;  GOALS: cure  CURRENT/MOST RECENT THERAPY : AC-T    Carcinoma of upper-outer quadrant of right breast in female, estrogen receptor negative (HCC)  07/13/2020 Initial Diagnosis   Carcinoma of upper-outer quadrant of right breast in female, estrogen receptor negative (HCC)    Genetic Testing   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 The report date is 08/02/2020.  The Multi-Cancer Panel offered by [facility] includes sequencing and/or deletion duplication testing of the following 85 genes: AIP, ALK, APC, ATM, AXIN2,BAP1,  BARD1, BLM, BMPR1A, BRCA1, BRCA2, BRIP1, CASR, CDC73, CDH1, CDK4, CDKN1B, CDKN1C, CDKN2A (p14ARF), CDKN2A (p16INK4a), CEBPA, CHEK2, CTNNA1, DICER1, DIS3L2, EGFR (c.2369C>T, p.Thr790Met variant only),  EPCAM (Deletion/duplication testing only), FH, FLCN, GATA2, GPC3, GREM1 (Promoter region deletion/duplication testing only), HOXB13 (c.251G>A, p.Gly84Glu), HRAS, KIT, MAX, MEN1, MET, MITF (c.952G>A, p.Glu318Lys variant only), MLH1, MSH2, MSH3, MSH6, MUTYH, NBN, NF1, NF2, NTHL1, PALB2, PDGFRA, PHOX2B, PMS2, POLD1, POLE, POT1, PRKAR1A, PTCH1, PTEN, RAD50, RAD51C, RAD51D, RB1, RECQL4, RET, RNF43, RUNX1, SDHAF2, SDHA (sequence changes only), SDHB, SDHC, SDHD, SMAD4, SMARCA4, SMARCB1, SMARCE1, STK11, SUFU, TERC, TERT, TMEM127, TP53, TSC1, TSC2, VHL, WRN and WT1.    09/10/2020 - 01/17/2021 Chemotherapy   Patient is on Treatment Plan : BREAST ADJUVANT DOSE DENSE AC q14d / PACLitaxel q7d       HISTORY OF PRESENTING ILLNESS: Alone.  Ambulating independently.  Shekeria Osuna 47 y.o.  female   with stage I triple negative breast cancer s/p adjuvant chemotherapy is here for follow-up.  Continues to have neuropathy. Noted to have improvement of her neuropathy in the hands.   C/o ADD is worse since chemo.  Review of Systems  Constitutional:  Positive for malaise/fatigue. Negative for chills, diaphoresis, fever and weight loss.  HENT:  Negative for nosebleeds and sore throat.   Eyes:  Negative for double vision.  Respiratory:  Negative for cough, hemoptysis, sputum production, shortness of breath and wheezing.   Cardiovascular:  Negative for chest pain, palpitations, orthopnea and leg swelling.  Gastrointestinal:  Negative for abdominal pain, blood in stool, diarrhea, heartburn, melena and vomiting.  Genitourinary:  Negative for dysuria, frequency and urgency.  Musculoskeletal:  Positive for joint pain and myalgias. Negative for back pain.  Skin:  Positive for itching and rash.  Neurological:  Positive for tingling. Negative for dizziness, focal weakness, weakness and headaches.  Endo/Heme/Allergies:  Does not bruise/bleed easily.  Psychiatric/Behavioral:  Negative for depression.  The patient is not  nervous/anxious and does not have insomnia.      MEDICAL HISTORY:  Past Medical History:  Diagnosis Date   Breast cancer (HCC) 08/05/2020   Carcinoma of upper-outer quadrant of right breast in female, estrogen receptor negative (HCC) 07/13/2020   Complication of anesthesia    hard to wake up-very dizzy feeling like she was going to pass out   H/O foot surgery 08/2018   History of kidney stones    h/o   Hypertension    PONV (postoperative nausea and vomiting)    n/v aftter kidney stone surgery    SURGICAL HISTORY: Past Surgical History:  Procedure Laterality Date   BREAST BIOPSY Right 07/06/2020   u/s bx Q clip  INVASIVE MAMMARY CARCINOMA, NO SPECIAL   BREAST RECONSTRUCTION WITH PLACEMENT OF TISSUE EXPANDER AND FLEX HD (ACELLULAR HYDRATED DERMIS) Right 08/05/2020   Procedure: RIGHT BREAST RECONSTRUCTION WITH PLACEMENT OF TISSUE EXPANDER AND FLEX HD (ACELLULAR HYDRATED DERMIS);  Surgeon: Peggye Form, DO;  Location: [facility];  Service: Plastics;  Laterality: Right;   BREAST REDUCTION WITH MASTOPEXY     BUNIONECTOMY Right    titanium pin   CERVICAL CONE BIOPSY     COLONOSCOPY  01/24/2023   at [facility]   DILATION AND EVACUATION N/A 03/12/2018   Procedure: DILATATION AND EVACUATION;  Surgeon: Christeen Douglas, MD;  Location: [facility];  Service: Gynecology;  Laterality: N/A;   KIDNEY STONE SURGERY     LAPAROSCOPIC OVARIAN CYSTECTOMY Right 06/07/2019   Procedure: LAPAROSCOPIC OVARIAN CYSTECTOMY;  Surgeon: Christeen Douglas, MD;  Location: [facility];  Service: Gynecology;  Laterality: Right;   LAPAROSCOPY N/A 06/07/2019   Procedure: LAPAROSCOPY Addison Lank BIOPSIES;  Surgeon: Christeen Douglas, MD;  Location: [facility];  Service: Gynecology;  Laterality: N/A;   MASTECTOMY Right 08/05/2020   RIGHT mastectomy with implant 03/09/2021   MASTOPEXY Left 03/09/2021   Procedure: MASTOPEXY LEFT BREAST;  Surgeon: Peggye Form, DO;  Location: [facility];   Service: Plastics;  Laterality: Left;   PORTACATH PLACEMENT Left 08/05/2020   Procedure: INSERTION PORT-A-CATH;  Surgeon: Carolan Shiver, MD;  Location: [facility];  Service: General;  Laterality: Left;   RECONSTRUCTION BREAST IMMEDIATE / DELAYED W/ TISSUE EXPANDER Right 03/09/2021   REMOVAL OF TISSUE EXPANDER AND PLACEMENT OF IMPLANT Right 03/09/2021   Procedure: Removal of right breast expander with placement of implant;  Surgeon: Peggye Form, DO;  Location: [facility];  Service: Plastics;  Laterality: Right;  2.5 hours   TOTAL MASTECTOMY Right 08/05/2020   Procedure: TOTAL MASTECTOMY w/ Sentinel Node;  Surgeon: Carolan Shiver, MD;  Location: [facility];  Service: General;  Laterality: Right;    SOCIAL HISTORY: Social History   Socioeconomic History   Marital status: Married    Spouse name: Doug   Number of children: 0   Years of education: Not on file   Highest education level: Not on file  Occupational History   Occupation: Regulatory affairs officer: [employer]  Tobacco Use   Smoking status: Never   Smokeless tobacco: Never  Vaping Use   Vaping Use: Never used  Substance and Sexual Activity   Alcohol use: Not Currently   Drug use: Never   Sexual activity: Yes    Birth control/protection: Condom  Other Topics Concern   Not on file  Social History Narrative   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 No children. Lives in [place].  Social Determinants of Health   Financial Resource Strain: Low Risk  (08/30/2020)   Overall Financial Resource Strain (CARDIA)    Difficulty of Paying Living Expenses: Not hard at all  Food Insecurity: Not on file  Transportation Needs: No Transportation Needs (08/30/2020)   PRAPARE - Administrator, Civil Service (Medical): No    Lack of Transportation (Non-Medical): No  Physical Activity: Not on file  Stress: Stress Concern Present (08/30/2020)    [employer] of Occupational Health - Occupational Stress Questionnaire    Feeling of Stress : Very much  Social Connections: Not on file  Intimate Partner Violence: Not on file    FAMILY HISTORY: Family History  Problem Relation Age of Onset   Cancer Other        great aunt- GI cancer   Breast cancer Neg Hx     ALLERGIES:  has No Known Allergies.  MEDICATIONS:  Current Outpatient Medications  Medication Sig Dispense Refill   ALPHA-LIPOIC ACID PO Take 500 mg by mouth daily.      amphetamine-dextroamphetamine (ADDERALL XR) 30 MG 24 hr capsule Take 30 mg by mouth daily.      capsicum (ZOSTRIX) 0.075 % topical cream Apply 1 application topically 2 (two) times daily.     estradiol (ESTRACE) 0.1 MG/GM vaginal cream Place vaginally.     lisinopril (ZESTRIL) 5 MG tablet Take 5 mg by mouth daily. Takes 2.5 mg qd     pregabalin (LYRICA) 100 MG capsule Take 1 capsule (100 mg total) by mouth daily. 180 capsule 1   Probiotic Product (PROBIOTIC ADVANCED PO) Take 1 capsule by mouth daily.      traMADol (ULTRAM-ER) 200 MG 24 hr tablet Take 1 tablet (200 mg total) by mouth daily. 30 tablet 3   traZODone (DESYREL) 50 MG tablet Take 50 mg by mouth at bedtime.     trolamine salicylate (ASPERCREME) 10 % cream Apply 1 application topically as needed for muscle pain.     No current facility-administered medications for this visit.      Marland Kitchen  PHYSICAL EXAMINATION: ECOG PERFORMANCE STATUS: 0 - Asymptomatic  Vitals:   02/28/23 1432  BP: (!) 140/92  Pulse: 78  Temp: (!) 97.2 F (36.2 C)  SpO2: 100%   Filed Weights   02/28/23 1432  Weight: 97 lb (44 kg)     Physical Exam Constitutional:      Comments: Alone; ambulating independently.   HENT:     Head: Normocephalic and atraumatic.     Mouth/Throat:     Pharynx: No oropharyngeal exudate.  Eyes:     Pupils: Pupils are equal, round, and reactive to light.  Cardiovascular:     Rate and Rhythm: Normal rate and regular rhythm.   Pulmonary:     Effort: Pulmonary effort is normal. No respiratory distress.     Breath sounds: Normal breath sounds. No wheezing.  Abdominal:     General: Bowel sounds are normal. There is no distension.     Palpations: Abdomen is soft. There is no mass.     Tenderness: There is no abdominal tenderness. There is no guarding or rebound.  Musculoskeletal:        General: No tenderness. Normal range of motion.     Cervical back: Normal range of motion and neck supple.  Skin:    General: Skin is warm.  Neurological:     Mental Status: She is alert and oriented to person, place, and time.  Psychiatric:  Mood and Affect: Affect normal.    LABORATORY DATA:  I have reviewed the data as listed Lab Results  Component Value Date   WBC 3.7 (L) 02/28/2023   HGB 12.2 02/28/2023   HCT 36.4 02/28/2023   MCV 92.6 02/28/2023   PLT 192 02/28/2023   Recent Labs    02/28/23 1434  NA 136  K 4.4  CL 100  CO2 28  GLUCOSE 128*  BUN 25*  CREATININE 0.83  CALCIUM 9.3  GFRNONAA >60  PROT 7.1  ALBUMIN 4.4  AST 18  ALT 17  ALKPHOS 44  BILITOT 0.4    RADIOGRAPHIC STUDIES: I have personally reviewed the radiological images as listed and agreed with the findings in the report. No results found.  ASSESSMENT & PLAN:   Carcinoma of upper-outer quadrant of right breast in female, estrogen receptor negative (HCC) # Stage I triple negative breast cancer on adjuvant chemotherapy with s/p 4 cycles of AC; Taxol s/p 6 treatments. DISCONTINUED further Taxol chemotherapy given worsening neuropathy.   DEC 2023- UNI LAT LEFT-MAMMO- WNL [Dr.cintron]. Stable.  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  On Tramadol ER 200 mg; improvement noted but not resolved. Stable.    # ADD worsened with chemotherapy/ongoing fatigue. Defer to Neurology; Dr.Shah.  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 Mervin Hack  2022]-hold off prophylactic BSO.   # Mil leucopenia- monitor for now.   # DISPOSITION:  # follow up as planned in 12  month MD; labs- cbc/cmp-Dr.B    All questions were answered. The patient/family knows to call the clinic with any problems, questions or concerns.    Earna Coder, MD 02/28/2023 3:27 PM

## 2023-02-28 NOTE — Assessment & Plan Note (Addendum)
#   Stage I triple negative breast cancer on adjuvant chemotherapy with s/p 4 cycles of AC; Taxol s/p 6 treatments. DISCONTINUED further Taxol chemotherapy given worsening neuropathy.   DEC 2023- UNI LAT LEFT-MAMMO- WNL [Dr.cintron]. Stable.  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  On Tramadol ER 200 mg; improvement noted but not resolved. Stable.    # ADD worsened with chemotherapy/ongoing fatigue. Defer to Neurology; Dr.Shah.  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 Mervin Hack 2022]-hold off prophylactic BSO.   # Mil leucopenia- monitor for now.   # DISPOSITION:  # follow up as planned in 12  month MD; labs- cbc/cmp-Dr.B

## 2023-02-28 NOTE — Progress Notes (Signed)
No concerns today.  C/o ADD is worse since chemo.

## 2023-03-06 ENCOUNTER — Ambulatory Visit
Payer: BC Managed Care – PPO | Attending: Student in an Organized Health Care Education/Training Program | Admitting: Student in an Organized Health Care Education/Training Program

## 2023-03-06 ENCOUNTER — Encounter: Payer: Self-pay | Admitting: Student in an Organized Health Care Education/Training Program

## 2023-03-06 VITALS — BP 119/90 | HR 93 | Temp 97.5°F | Resp 16 | Ht 62.0 in | Wt 95.0 lb

## 2023-03-06 DIAGNOSIS — G894 Chronic pain syndrome: Secondary | ICD-10-CM | POA: Insufficient documentation

## 2023-03-06 DIAGNOSIS — R202 Paresthesia of skin: Secondary | ICD-10-CM | POA: Insufficient documentation

## 2023-03-06 DIAGNOSIS — G608 Other hereditary and idiopathic neuropathies: Secondary | ICD-10-CM | POA: Insufficient documentation

## 2023-03-06 DIAGNOSIS — G603 Idiopathic progressive neuropathy: Secondary | ICD-10-CM | POA: Diagnosis not present

## 2023-03-06 DIAGNOSIS — G629 Polyneuropathy, unspecified: Secondary | ICD-10-CM | POA: Insufficient documentation

## 2023-03-06 MED ORDER — TRAMADOL HCL ER 200 MG PO TB24
200.0000 mg | ORAL_TABLET | Freq: Every day | ORAL | 3 refills | Status: DC
Start: 2023-04-25 — End: 2023-08-02

## 2023-03-06 MED ORDER — PREGABALIN 100 MG PO CAPS
100.0000 mg | ORAL_CAPSULE | Freq: Every day | ORAL | 1 refills | Status: DC
Start: 2023-03-06 — End: 2023-08-02

## 2023-03-06 NOTE — Progress Notes (Signed)
Nursing Pain Medication Assessment:  Safety precautions to be maintained throughout the outpatient stay will include: orient to surroundings, keep bed in low position, maintain call bell within reach at all times, provide assistance with transfer out of bed and ambulation.  Medication Inspection Compliance: Pill count conducted under aseptic conditions, in front of the patient. Neither the pills nor the bottle was removed from the patient's sight at any time. Once count was completed pills were immediately returned to the patient in their original bottle.  Medication: Tramadol (Ultram) Pill/Patch Count:  13 of 30 pills remain Pill/Patch Appearance: Markings consistent with prescribed medication Bottle Appearance: Standard pharmacy container. Clearly labeled. Filled Date: 6 / 1 / 2024 Last Medication intake:  Today

## 2023-03-06 NOTE — Progress Notes (Signed)
PROVIDER NOTE: Information contained herein reflects review and annotations entered in association with encounter. Interpretation of such information and data should be left to medically-trained personnel. Information provided to patient can be located elsewhere in the medical record under "Patient Instructions". Document created using STT-dictation technology, any transcriptional errors that may result from process are unintentional.    Patient: Beth Wells  Service Category: E/M  Provider: Edward Jolly, MD  DOB: 1976-08-23  DOS: 03/06/2023  Specialty: Interventional Pain Management  MRN: [id number]  Setting: Ambulatory outpatient  PCP: Gardiner Coins, PA-C  Type: Established Patient    Referring Provider: Judeth Cornfield*  Location: Office  Delivery: Face-to-face     HPI  Beth Wells, a 47 y.o. year old female, is here today because of her Chronic pain syndrome [G89.4]. Ms. Marlett primary complain today is Leg Pain (Lower legs)  Last encounter: My last encounter with her was on 11/07/22  Pertinent problems: Ms. Golembeski has Small fiber neuropathy; Paresthesias; and Chronic pain syndrome on their pertinent problem list. Pain Assessment: Severity of Chronic pain is reported as a 5 /10. Location: Leg Right, Left, Lower/sometimes to toes. Onset: More than a month ago. Quality: Burning. Timing: Constant. Modifying factor(s): meds. Vitals:  height is 5\' 2"  (1.575 m) and weight is 95 lb (43.1 kg). Her temporal temperature is 97.5 F (36.4 C) (abnormal). Her blood pressure is 119/90 (abnormal) and her pulse is 93. Her respiration is 16 and oxygen saturation is 99%.   Reason for encounter: medication management.    Patient presents today for medication management.  No significant change in her medical history.   She takes 100 mg Lyrica.  She continues tramadol 200 mg ER.   Pharmacotherapy Assessment  Analgesic: tramadol 200 mg ER    Monitoring: [place] PMP: PDMP reviewed during this  encounter.       Pharmacotherapy: No side-effects or adverse reactions reported. Compliance: No problems identified. Effectiveness: Clinically acceptable.  UDS:  Summary  Date Value Ref Range Status  08/10/2022 Note  Final    Comment:    ==================================================================== [employer] Select 13 (MW) ==================================================================== Test                             Result       Flag       Units  Drug Present and Declared for Prescription Verification   Amphetamine                    >10204       EXPECTED   ng/mg creat    Amphetamine is available as a schedule II prescription drug.    Tramadol                       >5102        EXPECTED   ng/mg creat   O-Desmethyltramadol            >5102        EXPECTED   ng/mg creat   N-Desmethyltramadol            >5102        EXPECTED   ng/mg creat    Source of tramadol is a prescription medication. O-desmethyltramadol    and N-desmethyltramadol are expected metabolites of tramadol.  ==================================================================== Test                      Result  Flag   Units      Ref Range   Creatinine              98               mg/dL      >=47 ==================================================================== Declared Medications:  The flagging and interpretation on this report are based on the  following declared medications.  Unexpected results may arise from  inaccuracies in the declared medications.   **Note: The testing scope of this panel includes these medications:   Amphetamine (Adderall)  Tramadol (Ultram)   **Note: The testing scope of this panel does not include the  following reported medications:   Alpha Lipoic Acid (Alpha Lipoic Acid)  Capsaicin (Zostrix)  Estradiol (Estrace)  Pregabalin (Lyrica)  Probiotic  Trazodone (Desyrel)  Trolamine Salicylate (topical)  (Aspercreme) ==================================================================== For clinical consultation, please call [phone]. ====================================================================      ROS  Constitutional: Denies any fever or chills Gastrointestinal: No reported hemesis, hematochezia, vomiting, or acute GI distress Musculoskeletal: Denies any acute onset joint swelling, redness, loss of ROM, or weakness Neurological:  Lower extremity neuropathic pain, allodynia  Medication Review  Alpha-Lipoic Acid, Probiotic Product, amphetamine-dextroamphetamine, capsicum, estradiol, lisinopril, pregabalin, traMADol, traZODone, and trolamine salicylate  History Review  Allergy: Ms. Martinson has No Known Allergies. Drug: Ms. Asano  reports no history of drug use. Alcohol:  reports that she does not currently use alcohol. Tobacco:  reports that she has never smoked. She has never used smokeless tobacco. Social: Ms. Craun  reports that she has never smoked. She has never used smokeless tobacco. She reports that she does not currently use alcohol. She reports that she does not use drugs. Medical:  has a past medical history of Breast cancer (HCC) (08/05/2020), Carcinoma of upper-outer quadrant of right breast in female, estrogen receptor negative (HCC) (07/13/2020), Complication of anesthesia, H/O foot surgery (08/2018), History of kidney stones, Hypertension, and PONV (postoperative nausea and vomiting). Surgical: Ms. Nitsch  has a past surgical history that includes Kidney stone surgery; Cervical cone biopsy; Dilation and evacuation (N/A, 03/12/2018); laparoscopy (N/A, 06/07/2019); Laparoscopic ovarian cystectomy (Right, 06/07/2019); Breast biopsy (Right, 07/06/2020); Bunionectomy (Right); Total mastectomy (Right, 08/05/2020); Portacath placement (Left, 08/05/2020); Breast reconstruction with placement of tissue expander and flex hd (acellular hydrated dermis) (Right, 08/05/2020); Removal  of tissue expander and placement of implant (Right, 03/09/2021); Mastopexy (Left, 03/09/2021); Reconstruction breast immediate / delayed w/ tissue expander (Right, 03/09/2021); Mastectomy (Right, 08/05/2020); Breast reduction with mastopexy; and Colonoscopy (01/24/2023). Family: family history includes Cancer in an other family member.  Laboratory Chemistry Profile   Renal Lab Results  Component Value Date   BUN 25 (H) 02/28/2023   CREATININE 0.83 02/28/2023   GFRAA >60 06/07/2019   GFRNONAA >60 02/28/2023     Hepatic Lab Results  Component Value Date   AST 18 02/28/2023   ALT 17 02/28/2023   ALBUMIN 4.4 02/28/2023   ALKPHOS 44 02/28/2023     Electrolytes Lab Results  Component Value Date   NA 136 02/28/2023   K 4.4 02/28/2023   CL 100 02/28/2023   CALCIUM 9.3 02/28/2023     Bone No results found for: "VD25OH", "VD125OH2TOT", "MV7846NG2", "XB2841LK4", "25OHVITD1", "25OHVITD2", "25OHVITD3", "TESTOFREE", "TESTOSTERONE"   Inflammation (CRP: Acute Phase) (ESR: Chronic Phase) Lab Results  Component Value Date   LATICACIDVEN 1.4 10/18/2020       Note: Above Lab results reviewed.  Recent Imaging Review  MM 3D SCREEN BREAST UNI LEFT CLINICAL DATA:  Screening.  EXAM: DIGITAL SCREENING UNILATERAL LEFT MAMMOGRAM WITH CAD AND TOMOSYNTHESIS  TECHNIQUE: Left screening digital craniocaudal and mediolateral oblique mammograms were obtained. Left screening digital breast tomosynthesis was performed. The images were evaluated with computer-aided detection.  COMPARISON:  Previous exam(s).  ACR Breast Density Category b: There are scattered areas of fibroglandular density.  FINDINGS: There are no findings suspicious for malignancy.  IMPRESSION: No mammographic evidence of malignancy. A result letter of this screening mammogram will be mailed directly to the patient.  RECOMMENDATION: Screening mammogram in one year. (Code:SM-B-01Y)  BI-RADS CATEGORY  1:  Negative.  Electronically Signed   By: Elberta Fortis M.D.   On: 09/01/2022 10:27  Note: Reviewed        Physical Exam  General appearance: Well nourished, well developed, and well hydrated. In no apparent acute distress Mental status: Alert, oriented x 3 (person, place, & time)       Respiratory: No evidence of acute respiratory distress Eyes: PERLA Vitals: BP (!) 119/90   Pulse 93   Temp (!) 97.5 F (36.4 C) (Temporal)   Resp 16   Ht 5\' 2"  (1.575 m)   Wt 95 lb (43.1 kg)   LMP  (LMP Unknown)   SpO2 99%   BMI 17.38 kg/m  BMI: Estimated body mass index is 17.38 kg/m as calculated from the following:   Height as of this encounter: 5\' 2"  (1.575 m).   Weight as of this encounter: 95 lb (43.1 kg). Ideal: Female patients must weigh at least 45.5 kg to calculate ideal body weight  Neurogenic pain pattern of lower extremity, allodynia noted  5 out of 5 strength bilateral lower extremity: Plantar flexion, dorsiflexion, knee flexion, knee extension.   Assessment   Diagnosis  1. Chronic pain syndrome   2. Small fiber neuropathy   3. Idiopathic progressive neuropathy   4. Idiopathic small fiber sensory neuropathy   5. Paresthesias        Plan of Care  Ms. Infant Cotler has a current medication list which includes the following long-term medication(s): amphetamine-dextroamphetamine, trazodone, and pregabalin.  Pharmacotherapy (Medications Ordered): Meds ordered this encounter  Medications   traMADol (ULTRAM-ER) 200 MG 24 hr tablet    Sig: Take 1 tablet (200 mg total) by mouth daily.    Dispense:  30 tablet    Refill:  3    For chronic pain syndrome   pregabalin (LYRICA) 100 MG capsule    Sig: Take 1 capsule (100 mg total) by mouth daily.    Dispense:  180 capsule    Refill:  1    Fill one day early if pharmacy is closed on scheduled refill date. May substitute for generic if available.   Discussed SCS therapy with pt.   Follow-up plan:   Return in about 5 months  (around 08/06/2023) for Medication Management, in person.   Recent Visits No visits were found meeting these conditions. Showing recent visits within past 90 days and meeting all other requirements Today's Visits Date Type Provider Dept  03/06/23 Office Visit Edward Jolly, MD [facility]  Showing today's visits and meeting all other requirements Future Appointments No visits were found meeting these conditions. Showing future appointments within next 90 days and meeting all other requirements  I discussed the assessment and treatment plan with the patient. The patient was provided an opportunity to ask questions and all were answered. The patient agreed with the plan and demonstrated an understanding of the instructions.  Patient advised to call back or  seek an in-person evaluation if the symptoms or condition worsens.  Duration of encounter: 30 minutes.  Note by: Edward Jolly, MD Date: 03/06/2023; Time: 3:34 PM

## 2023-04-09 ENCOUNTER — Telehealth: Payer: Self-pay | Admitting: Student in an Organized Health Care Education/Training Program

## 2023-04-09 NOTE — Telephone Encounter (Signed)
PT called states that she will be going out of town , wants to see if Dr. Cherylann Ratel will allow pahramcy to fill her tramadol early. Please give patient a call. TY

## 2023-04-10 ENCOUNTER — Telehealth: Payer: Self-pay | Admitting: Student in an Organized Health Care Education/Training Program

## 2023-04-10 ENCOUNTER — Other Ambulatory Visit: Payer: Self-pay

## 2023-04-10 NOTE — Telephone Encounter (Signed)
 Patient called and informed

## 2023-04-10 NOTE — Telephone Encounter (Signed)
Patient notified

## 2023-04-10 NOTE — Telephone Encounter (Signed)
Patient called back to see if Dr. Cherylann Ratel will allow her to pick up medication early, due to the fact that she is going out of town for 2 weeks. Please give patient a call. TY

## 2023-08-02 ENCOUNTER — Encounter: Payer: Self-pay | Admitting: Student in an Organized Health Care Education/Training Program

## 2023-08-02 ENCOUNTER — Ambulatory Visit
Payer: BC Managed Care – PPO | Attending: Student in an Organized Health Care Education/Training Program | Admitting: Student in an Organized Health Care Education/Training Program

## 2023-08-02 ENCOUNTER — Inpatient Hospital Stay: Payer: BC Managed Care – PPO | Attending: Internal Medicine

## 2023-08-02 VITALS — BP 136/90 | HR 93 | Temp 97.3°F | Resp 16 | Ht 62.0 in | Wt 96.0 lb

## 2023-08-02 DIAGNOSIS — G603 Idiopathic progressive neuropathy: Secondary | ICD-10-CM | POA: Insufficient documentation

## 2023-08-02 DIAGNOSIS — G608 Other hereditary and idiopathic neuropathies: Secondary | ICD-10-CM | POA: Diagnosis present

## 2023-08-02 DIAGNOSIS — G629 Polyneuropathy, unspecified: Secondary | ICD-10-CM | POA: Diagnosis present

## 2023-08-02 DIAGNOSIS — G894 Chronic pain syndrome: Secondary | ICD-10-CM | POA: Insufficient documentation

## 2023-08-02 DIAGNOSIS — R202 Paresthesia of skin: Secondary | ICD-10-CM | POA: Diagnosis present

## 2023-08-02 MED ORDER — TRAMADOL HCL ER 200 MG PO TB24
200.0000 mg | ORAL_TABLET | Freq: Every day | ORAL | 5 refills | Status: DC
Start: 2023-08-02 — End: 2024-01-28

## 2023-08-02 MED ORDER — PREGABALIN 100 MG PO CAPS
100.0000 mg | ORAL_CAPSULE | Freq: Every day | ORAL | 1 refills | Status: DC
Start: 2023-08-02 — End: 2024-01-28

## 2023-08-02 NOTE — Progress Notes (Signed)
PROVIDER NOTE: Information contained herein reflects review and annotations entered in association with encounter. Interpretation of such information and data should be left to medically-trained personnel. Information provided to patient can be located elsewhere in the medical record under "Patient Instructions". Document created using STT-dictation technology, any transcriptional errors that may result from process are unintentional.    Patient: Beth Wells  Service Category: E/M  Provider: Edward Jolly, MD  DOB: [birth year]  DOS: 08/02/2023  Specialty: Interventional Pain Management  MRN: [id number]  Setting: Ambulatory outpatient  PCP: Gardiner Coins, PA-C  Type: Established Patient    Referring Provider: Judeth Cornfield*  Location: Office  Delivery: Face-to-face     HPI  Beth Wells, a 47 y.o. year old female, is here today because of her Chronic pain syndrome [G89.4]. Beth Wells primary complain today is Foot Pain (Small fiber neuropathy )  Last encounter: My last encounter with her was on 03/06/23  Pertinent problems: Beth Wells has Small fiber neuropathy; Paresthesias; and Chronic pain syndrome on their pertinent problem list. Pain Assessment: Severity of Chronic pain is reported as a 8 /10. Location: Foot (arms, hands feet and legs) Left, Right/denies. Onset: More than a month ago. Quality: Burning, Constant, Discomfort. Timing: Constant. Modifying factor(s): moving around or change of position, warm shoiwers, infra red lights. Vitals:  height is 5\' 2"  (1.575 m) and weight is 96 lb (43.5 kg). Her temporal temperature is 97.3 F (36.3 C) (abnormal). Her blood pressure is 136/90 (abnormal) and her pulse is 93. Her respiration is 16 and oxygen saturation is 100%.   Reason for encounter: medication management.    Patient presents today for medication management.  No significant change in her medical history.   She takes 100 mg Lyrica qhs.  She continues tramadol 200 mg  ER.   Pharmacotherapy Assessment  Analgesic: tramadol 200 mg ER    Monitoring: [place] PMP: PDMP reviewed during this encounter.       Pharmacotherapy: No side-effects or adverse reactions reported. Compliance: No problems identified. Effectiveness: Clinically acceptable.  UDS:  Summary  Date Value Ref Range Status  08/10/2022 Note  Final    Comment:    ==================================================================== [employer] Select 13 (MW) ==================================================================== Test                             Result       Flag       Units  Drug Present and Declared for Prescription Verification   Amphetamine                    >10204       EXPECTED   ng/mg creat    Amphetamine is available as a schedule II prescription drug.    Tramadol                       >5102        EXPECTED   ng/mg creat   O-Desmethyltramadol            >5102        EXPECTED   ng/mg creat   N-Desmethyltramadol            >5102        EXPECTED   ng/mg creat    Source of tramadol is a prescription medication. O-desmethyltramadol    and N-desmethyltramadol are expected metabolites of tramadol.  ==================================================================== Test  Result    Flag   Units      Ref Range   Creatinine              98               mg/dL      >=16 ==================================================================== Declared Medications:  The flagging and interpretation on this report are based on the  following declared medications.  Unexpected results may arise from  inaccuracies in the declared medications.   **Note: The testing scope of this panel includes these medications:   Amphetamine (Adderall)  Tramadol (Ultram)   **Note: The testing scope of this panel does not include the  following reported medications:   Alpha Lipoic Acid (Alpha Lipoic Acid)  Capsaicin (Zostrix)  Estradiol (Estrace)  Pregabalin (Lyrica)  Probiotic   Trazodone (Desyrel)  Trolamine Salicylate (topical) (Aspercreme) ==================================================================== For clinical consultation, please call [phone]. ====================================================================      ROS  Constitutional: Denies any fever or chills Gastrointestinal: No reported hemesis, hematochezia, vomiting, or acute GI distress Musculoskeletal: Denies any acute onset joint swelling, redness, loss of ROM, or weakness Neurological:  Lower extremity neuropathic pain, allodynia  Medication Review  Alpha-Lipoic Acid, Probiotic Product, amphetamine-dextroamphetamine, capsicum, estradiol, lisinopril, pregabalin, traMADol, traZODone, and trolamine salicylate  History Review  Allergy: [name] has No Known Allergies. Drug: Beth Wells  reports no history of drug use. Alcohol:  reports that she does not currently use alcohol. Tobacco:  reports that she has never smoked. She has never used smokeless tobacco. Social: [name]  reports that she has never smoked. She has never used smokeless tobacco. She reports that she does not currently use alcohol. She reports that she does not use drugs. Medical:  has a past medical history of Breast cancer (HCC) (08/05/2020), Carcinoma of upper-outer quadrant of right breast in female, estrogen receptor negative (HCC) (07/13/2020), Complication of anesthesia, H/O foot surgery (08/2018), History of kidney stones, Hypertension, and PONV (postoperative nausea and vomiting). Surgical: Beth Wells  has a past surgical history that includes Kidney stone surgery; Cervical cone biopsy; Dilation and evacuation (N/A, 03/12/2018); laparoscopy (N/A, 06/07/2019); Laparoscopic ovarian cystectomy (Right, 06/07/2019); Breast biopsy (Right, 07/06/2020); Bunionectomy (Right); Total mastectomy (Right, 08/05/2020); Portacath placement (Left, 08/05/2020); Breast reconstruction with placement of tissue expander and flex hd  (acellular hydrated dermis) (Right, 08/05/2020); Removal of tissue expander and placement of implant (Right, 03/09/2021); Mastopexy (Left, 03/09/2021); Reconstruction breast immediate / delayed w/ tissue expander (Right, 03/09/2021); Mastectomy (Right, 08/05/2020); Breast reduction with mastopexy; and Colonoscopy (01/24/2023). Family: family history includes Cancer in an other family member.  Laboratory Chemistry Profile   Renal Lab Results  Component Value Date   BUN 25 (H) 02/28/2023   CREATININE 0.83 02/28/2023   GFRAA >60 06/07/2019   GFRNONAA >60 02/28/2023     Hepatic Lab Results  Component Value Date   AST 18 02/28/2023   ALT 17 02/28/2023   ALBUMIN 4.4 02/28/2023   ALKPHOS 44 02/28/2023     Electrolytes Lab Results  Component Value Date   NA 136 02/28/2023   K 4.4 02/28/2023   CL 100 02/28/2023   CALCIUM 9.3 02/28/2023     Bone No results found for: "VD25OH", "VD125OH2TOT", "WJ1914NW2", "NF6213YQ6", "25OHVITD1", "25OHVITD2", "25OHVITD3", "TESTOFREE", "TESTOSTERONE"   Inflammation (CRP: Acute Phase) (ESR: Chronic Phase) Lab Results  Component Value Date   LATICACIDVEN 1.4 10/18/2020       Note: Above Lab results reviewed.  Recent Imaging Review  MM 3D SCREEN BREAST UNI LEFT CLINICAL  DATA:  Screening.  EXAM: DIGITAL SCREENING UNILATERAL LEFT MAMMOGRAM WITH CAD AND TOMOSYNTHESIS  TECHNIQUE: Left screening digital craniocaudal and mediolateral oblique mammograms were obtained. Left screening digital breast tomosynthesis was performed. The images were evaluated with computer-aided detection.  COMPARISON:  Previous exam(s).  ACR Breast Density Category b: There are scattered areas of fibroglandular density.  FINDINGS: There are no findings suspicious for malignancy.  IMPRESSION: No mammographic evidence of malignancy. A result letter of this screening mammogram will be mailed directly to the patient.  RECOMMENDATION: Screening mammogram in one  year. (Code:SM-B-01Y)  BI-RADS CATEGORY  1: Negative.  Electronically Signed   By: Elberta Fortis M.D.   On: 09/01/2022 10:27  Note: Reviewed        Physical Exam  General appearance: Well nourished, well developed, and well hydrated. In no apparent acute distress Mental status: Alert, oriented x 3 (person, place, & time)       Respiratory: No evidence of acute respiratory distress Eyes: PERLA Vitals: BP (!) 136/90 (BP Location: Left Arm, Patient Position: Sitting, Cuff Size: Normal)   Pulse 93   Temp (!) 97.3 F (36.3 C) (Temporal)   Resp 16   Ht 5\' 2"  (1.575 m)   Wt 96 lb (43.5 kg)   SpO2 100%   BMI 17.56 kg/m  BMI: Estimated body mass index is 17.56 kg/m as calculated from the following:   Height as of this encounter: 5\' 2"  (1.575 m).   Weight as of this encounter: 96 lb (43.5 kg). Ideal: Female patients must weigh at least 45.5 kg to calculate ideal body weight  Neurogenic pain pattern of lower extremity, allodynia noted  5 out of 5 strength bilateral lower extremity: Plantar flexion, dorsiflexion, knee flexion, knee extension.   Assessment   Diagnosis  1. Chronic pain syndrome   2. Small fiber neuropathy   3. Idiopathic progressive neuropathy   4. Idiopathic small fiber sensory neuropathy   5. Paresthesias         Plan of Care  Ms. Gabriele Zwilling has a current medication list which includes the following long-term medication(s): amphetamine-dextroamphetamine, trazodone, and pregabalin.  Pharmacotherapy (Medications Ordered): Meds ordered this encounter  Medications   traMADol (ULTRAM-ER) 200 MG 24 hr tablet    Sig: Take 1 tablet (200 mg total) by mouth daily.    Dispense:  30 tablet    Refill:  5    For chronic pain syndrome   pregabalin (LYRICA) 100 MG capsule    Sig: Take 1 capsule (100 mg total) by mouth daily.    Dispense:  180 capsule    Refill:  1    Fill one day early if pharmacy is closed on scheduled refill date. May substitute for generic  if available.   Discussed SCS therapy with pt.   Follow-up plan:   Return in about 6 months (around 01/30/2024) for MM, F2F.   Recent Visits No visits were found meeting these conditions. Showing recent visits within past 90 days and meeting all other requirements Today's Visits Date Type Provider Dept  08/02/23 Office Visit Edward Jolly, MD [facility]  Showing today's visits and meeting all other requirements Future Appointments No visits were found meeting these conditions. Showing future appointments within next 90 days and meeting all other requirements  I discussed the assessment and treatment plan with the patient. The patient was provided an opportunity to ask questions and all were answered. The patient agreed with the plan and demonstrated an understanding of the instructions.  Patient advised to call back or seek an in-person evaluation if the symptoms or condition worsens.  Duration of encounter: 30 minutes.  Note by: Edward Jolly, MD Date: 08/02/2023; Time: 2:58 PM

## 2023-08-02 NOTE — Progress Notes (Signed)
Nursing Pain Medication Assessment:  Safety precautions to be maintained throughout the outpatient stay will include: orient to surroundings, keep bed in low position, maintain call bell within reach at all times, provide assistance with transfer out of bed and ambulation.  Medication Inspection Compliance: Pill count conducted under aseptic conditions, in front of the patient. Neither the pills nor the bottle was removed from the patient's sight at any time. Once count was completed pills were immediately returned to the patient in their original bottle.  Medication: Tramadol (Ultram) Pill/Patch Count:  0 of 30 pills remain Pill/Patch Appearance: Markings consistent with prescribed medication Bottle Appearance: Standard pharmacy container. Clearly labeled. Filled Date: 8 / 01 / 2024 Last Medication intake:  Ran out of medicine more than 48 hours ago

## 2023-08-02 NOTE — Progress Notes (Signed)
[facility] CSW Progress Note  Clinical Child psychotherapist contacted patient by phone to discuss support.  Patient was interested in the breast cancer support group, which was cancelled for today.  She agreed to meet with CSW on Monday, 11/18 at 3:30.    Dorothey Baseman, LCSW Clinical Social Worker [facility]

## 2023-08-07 LAB — TOXASSURE SELECT 13 (MW), URINE

## 2023-08-13 ENCOUNTER — Other Ambulatory Visit: Payer: BC Managed Care – PPO

## 2023-08-13 ENCOUNTER — Inpatient Hospital Stay: Payer: BC Managed Care – PPO

## 2023-08-13 NOTE — Progress Notes (Signed)
[facility] CSW Progress Note  Visual merchandiser met with patient to provide supportive counseling.  Patient previously wished to attend the Breast Cancer Support group, but it was cancelled.  Explored patient's current concerns.  Provided information regarding the [facility] Breast Cancer Virtual Support Group.     Dorothey Baseman, LCSW Clinical Social Worker [facility]

## 2023-08-31 ENCOUNTER — Encounter: Payer: Self-pay | Admitting: Plastic Surgery

## 2023-08-31 ENCOUNTER — Ambulatory Visit: Payer: BC Managed Care – PPO | Admitting: Plastic Surgery

## 2023-08-31 VITALS — BP 147/99 | HR 89 | Ht 62.0 in | Wt 96.0 lb

## 2023-08-31 DIAGNOSIS — C50411 Malignant neoplasm of upper-outer quadrant of right female breast: Secondary | ICD-10-CM

## 2023-08-31 DIAGNOSIS — N651 Disproportion of reconstructed breast: Secondary | ICD-10-CM

## 2023-08-31 DIAGNOSIS — Z171 Estrogen receptor negative status [ER-]: Secondary | ICD-10-CM | POA: Diagnosis not present

## 2023-08-31 DIAGNOSIS — Z9011 Acquired absence of right breast and nipple: Secondary | ICD-10-CM

## 2023-09-02 ENCOUNTER — Encounter: Payer: Self-pay | Admitting: Plastic Surgery

## 2023-09-02 NOTE — Progress Notes (Signed)
   Subjective:    Patient ID: Beth Wells, female    DOB: Jan 24, 1976, 47 y.o.   MRN: [id number]  The patient is a 47 yrs old female here for follow up on her right breast reconstruction.  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 She had reconstruction with an implant on the right and a mastopexy on the left for symmetry.  She has a Dance movement psychotherapist smooth round high-profile gel 275 cc implant in place from Jun 2022. She is still interested in improvement in the right breast as the lower poll has not rounded out.  She is still 5 feet 2 inches tall and weight 100 pounds.  She is finding it difficult to gain weight.  I have estimated needing 50-100 cc of fat and am not confident I can get that much from her since she is so thin.      Review of Systems  Constitutional: Negative.   HENT: Negative.    Eyes: Negative.   Respiratory: Negative.    Gastrointestinal: Negative.   Endocrine: Negative.   Genitourinary: Negative.   Musculoskeletal: Negative.   Skin: Negative.   Hematological: Negative.        Objective:   Physical Exam Vitals reviewed.  Constitutional:      Appearance: Normal appearance.  HENT:     Head: Atraumatic.  Cardiovascular:     Rate and Rhythm: Normal rate.  Pulmonary:     Effort: Pulmonary effort is normal.  Abdominal:     Palpations: Abdomen is soft.  Skin:    Capillary Refill: Capillary refill takes less than 2 seconds.     Findings: No bruising.  Neurological:     Mental Status: She is alert and oriented to person, place, and time.  Psychiatric:        Mood and Affect: Mood normal.        Behavior: Behavior normal.        Thought Content: Thought content normal.        Judgment: Judgment normal.       Assessment & Plan:     ICD-10-CM   1. Carcinoma of upper-outer quadrant of right breast in female, estrogen receptor negative (HCC)  C50.411    Z17.1     2. Acquired absence of  right breast  Z90.11     3. Breast asymmetry following reconstructive surgery  N65.1        Pictures were obtained of the patient and placed in the chart with the patient's or guardian's permission.  We discussed a few options but none without risk.  A smaller implant, trying to do the fat grafting and repositioning the implant. The patient is going to think things over and come back in 2025.

## 2023-10-01 ENCOUNTER — Other Ambulatory Visit: Payer: Self-pay | Admitting: General Surgery

## 2023-10-01 DIAGNOSIS — Z1231 Encounter for screening mammogram for malignant neoplasm of breast: Secondary | ICD-10-CM

## 2023-10-02 ENCOUNTER — Encounter: Payer: Self-pay | Admitting: Plastic Surgery

## 2023-10-02 ENCOUNTER — Ambulatory Visit: Payer: BC Managed Care – PPO | Admitting: Plastic Surgery

## 2023-10-02 DIAGNOSIS — N651 Disproportion of reconstructed breast: Secondary | ICD-10-CM | POA: Diagnosis not present

## 2023-10-02 NOTE — Progress Notes (Signed)
   Subjective:    Patient ID: Analy Wells, female    DOB: 09-12-1976, 48 y.o.   MRN: [id number]  The patient is a 48 year old female joining me by phone for further discussion about her breast.  She is interested in seeing if we can improve the look of the right breast.  We talked again about options like decreasing the size of the right implant.  We could try and tighten up the bottom pole of the left breast.  We could try fat grafting even though it is going to difficult to get all the fat we need but it is worth a try before we go to other measures like the latissimus muscle flap.      Review of Systems  Constitutional: Negative.   HENT: Negative.    Eyes: Negative.   Respiratory: Negative.    Cardiovascular: Negative.   Endocrine: Negative.   Genitourinary: Negative.        Objective:   Physical Exam      Assessment & Plan:     ICD-10-CM   1. Breast asymmetry following reconstructive surgery  N65.1        The patient is in agreement in the meantime she is going to try to gain a little bit of weight.  She will call the office for follow-up.  I connected with  Beth Wells on 10/02/23 by phone and verified that I am speaking with the correct person using two identifiers.  The patient was at home and I was at the office.  We spent 5 minutes in discussion.   I discussed the limitations of evaluation and management by telemedicine. The patient expressed understanding and agreed to proceed.

## 2023-10-09 ENCOUNTER — Telehealth: Payer: BC Managed Care – PPO | Admitting: Plastic Surgery

## 2023-10-10 ENCOUNTER — Ambulatory Visit
Admission: RE | Admit: 2023-10-10 | Discharge: 2023-10-10 | Disposition: A | Discharge status: Home / Self Care | Payer: BC Managed Care – PPO | Source: Ambulatory Visit | Attending: General Surgery | Admitting: General Surgery

## 2023-10-10 DIAGNOSIS — Z1231 Encounter for screening mammogram for malignant neoplasm of breast: Secondary | ICD-10-CM | POA: Diagnosis present

## 2024-01-28 DIAGNOSIS — Z79899 Other long term (current) drug therapy: Secondary | ICD-10-CM | POA: Insufficient documentation

## 2024-01-28 NOTE — Progress Notes (Unsigned)
 PROVIDER NOTE: Interpretation of information contained herein should be left to medically-trained personnel. Specific patient instructions are provided elsewhere under "Patient Instructions" section of medical record. This document was created in part using AI and STT-dictation technology, any transcriptional errors that may result from this process are unintentional.  Patient: Beth Wells  Service: E/M   PCP: Marda Shack, PA-C  DOB: 1975/11/18  DOS: 01/29/2024  Provider: Cherylin Corrigan, NP  MRN: [id number]  Delivery: Face-to-face  Specialty: Interventional Pain Management  Type: Established Patient  Setting: Ambulatory outpatient facility  Specialty designation: 09  Referring Prov.: Maryhelen Snide*  Location: Outpatient office facility       HPI  Beth Wells, a 48 y.o. year old female, is here today because of her Medication management [Z79.899]. Beth Wells primary complain today is No chief complaint on file.  Pertinent problems: Beth Wells does not have any pertinent problems on file. Pain Assessment: Severity of   is reported as a  /10. Location:    / . Onset:  . Quality:  . Timing:  . Modifying factor(s):  Aaron Aas Vitals:  vitals were not taken for this visit.  BMI: Estimated body mass index is 17.56 kg/m as calculated from the following:   Height as of 08/31/23: 5\' 2"  (1.575 m).   Weight as of 08/31/23: 96 lb (43.5 kg). Last encounter: 08/02/2023 Last procedure: 05/04/2021  Reason for encounter: medication management. No change in medical history since last visit.  Patient's pain is at baseline.  Patient continues multimodal pain regimen as prescribed.  States that it provides pain relief and improvement in functional status.  Pharmacotherapy Assessment  Analgesic: {There is no content from the last Subjective section.}   Monitoring: [place] PMP: PDMP reviewed during this encounter.       Pharmacotherapy: No side-effects or adverse reactions reported. Compliance: No problems  identified. Effectiveness: Clinically acceptable.  No notes on file  No results found for: "CBDTHCR" No results found for: "D8THCCBX" No results found for: "D9THCCBX"  UDS:  Summary  Date Value Ref Range Status  08/02/2023 Note  Final    Comment:    ==================================================================== [facility] (MW) ==================================================================== Test                             Result       Flag       Units  Drug Present and Declared for Prescription Verification   Amphetamine                     >7299        EXPECTED   ng/mg creat    Amphetamine  is available as a schedule II prescription drug.    Tramadol                        221          EXPECTED   ng/mg creat   O-Desmethyltramadol            136          EXPECTED   ng/mg creat   N-Desmethyltramadol            >3650        EXPECTED   ng/mg creat    Source of tramadol  is a prescription medication. O-desmethyltramadol    and N-desmethyltramadol are expected metabolites of tramadol .  ==================================================================== Test  Result    Flag   Units      Ref Range   Creatinine              137              mg/dL      >=78 ==================================================================== Declared Medications:  The flagging and interpretation on this report are based on the  following declared medications.  Unexpected results may arise from  inaccuracies in the declared medications.   **Note: The testing scope of this panel includes these medications:   Amphetamine  (Adderall )  Tramadol  (Ultram )   **Note: The testing scope of this panel does not include the  following reported medications:   Alpha Lipoic Acid (Alpha Lipoic Acid)  Capsaicin  (Zostrix)  Estradiol (Estrace)  Lisinopril  (Zestril )  Pregabalin  (Lyrica )  Probiotic  Topical  Trazodone  (Desyrel) ==================================================================== For clinical consultation, please call [phone]. ====================================================================       ROS  Constitutional: Denies any fever or chills Gastrointestinal: No reported hemesis, hematochezia, vomiting, or acute GI distress Musculoskeletal: Denies any acute onset joint swelling, redness, loss of ROM, or weakness Neurological: No reported episodes of acute onset apraxia, aphasia, dysarthria, agnosia, amnesia, paralysis, loss of coordination, or loss of consciousness  Medication Review  Alpha-Lipoic Acid, Probiotic Product, amphetamine -dextroamphetamine , capsicum, estradiol, pregabalin , traMADol , traZODone, and trolamine salicylate  History Review  Allergy: Beth Wells has no known allergies. Drug: Beth Wells  reports no history of drug use. Alcohol:  reports that she does not currently use alcohol. Tobacco:  reports that she has never smoked. She has never used smokeless tobacco. Social: Beth Wells  reports that she has never smoked. She has never used smokeless tobacco. She reports that she does not currently use alcohol. She reports that she does not use drugs. Medical:  has a past medical history of Breast cancer (HCC) (08/05/2020), Carcinoma of upper-outer quadrant of right breast in female, estrogen receptor negative (HCC) (07/13/2020), Complication of anesthesia, H/O foot surgery (08/2018), History of kidney stones, Hypertension, and PONV (postoperative nausea and vomiting). Surgical: Beth Wells  has a past surgical history that includes Kidney stone surgery; Cervical cone biopsy; Dilation and evacuation (N/A, 03/12/2018); laparoscopy (N/A, 06/07/2019); Laparoscopic ovarian cystectomy (Right, 06/07/2019); Breast biopsy (Right, 07/06/2020); Bunionectomy (Right); Total mastectomy (Right, 08/05/2020); Portacath placement (Left, 08/05/2020); Breast reconstruction with placement of  tissue expander and flex hd (acellular hydrated dermis) (Right, 08/05/2020); Removal of tissue expander and placement of implant (Right, 03/09/2021); Mastopexy (Left, 03/09/2021); Reconstruction breast immediate / delayed w/ tissue expander (Right, 03/09/2021); Mastectomy (Right, 08/05/2020); Breast reduction with mastopexy; and Colonoscopy (01/24/2023). Family: family history includes Cancer in an other family member.  Laboratory Chemistry Profile   Renal Lab Results  Component Value Date   BUN 25 (H) 02/28/2023   CREATININE 0.83 02/28/2023   GFRAA >60 06/07/2019   GFRNONAA >60 02/28/2023    Hepatic Lab Results  Component Value Date   AST 18 02/28/2023   ALT 17 02/28/2023   ALBUMIN 4.4 02/28/2023   ALKPHOS 44 02/28/2023    Electrolytes Lab Results  Component Value Date   NA 136 02/28/2023   K 4.4 02/28/2023   CL 100 02/28/2023   CALCIUM 9.3 02/28/2023    Bone No results found for: "VD25OH", "VD125OH2TOT", "XL2440NU2", "VO5366YQ0", "25OHVITD1", "25OHVITD2", "25OHVITD3", "TESTOFREE", "TESTOSTERONE"  Inflammation (CRP: Acute Phase) (ESR: Chronic Phase) Lab Results  Component Value Date   LATICACIDVEN 1.4 10/18/2020         Note: Above Lab results reviewed.  Recent  Imaging Review  MM 3D SCREENING MAMMOGRAM UNILATERAL LEFT BREAST CLINICAL DATA:  Screening.  EXAM: DIGITAL SCREENING UNILATERAL LEFT MAMMOGRAM WITH CAD AND TOMOSYNTHESIS  TECHNIQUE: Left screening digital craniocaudal and mediolateral oblique mammograms were obtained. Left screening digital breast tomosynthesis was performed. The images were evaluated with computer-aided detection.  COMPARISON:  Previous exam(s).  ACR Breast Density Category b: There are scattered areas of fibroglandular density.  FINDINGS: The patient has had a right mastectomy. There are no findings suspicious for malignancy.  IMPRESSION: No mammographic evidence of malignancy. A result letter of this screening mammogram will  be mailed directly to the patient.  RECOMMENDATION: Screening mammogram in one year.  (Code:SM-L-24M)  BI-RADS CATEGORY  1: Negative.  Electronically Signed   By: Dobrinka  Dimitrova M.D.   On: 10/12/2023 09:33 Note: Reviewed        Physical Exam  General appearance: Well nourished, well developed, and well hydrated. In no apparent acute distress Mental status: Alert, oriented x 3 (person, place, & time)       Respiratory: No evidence of acute respiratory distress Eyes: PERLA Vitals: There were no vitals taken for this visit. BMI: Estimated body mass index is 17.56 kg/m as calculated from the following:   Height as of 08/31/23: 5\' 2"  (1.575 m).   Weight as of 08/31/23: 96 lb (43.5 kg). Ideal: Patient weight not recorded  Assessment   Diagnosis Status  1. Medication management   2. Chronic pain syndrome   3. Small fiber neuropathy   4. Idiopathic progressive neuropathy   5. Idiopathic small fiber sensory neuropathy   6. Paresthesias    Controlled Controlled Controlled   Updated Problems: Problem  Medication Management    Plan of Care  Problem-specific:  Assessment and Plan            Beth Wells has a current medication list which includes the following long-term medication(s): amphetamine -dextroamphetamine , pregabalin , and trazodone.  Pharmacotherapy (Medications Ordered): No orders of the defined types were placed in this encounter.  Orders:  No orders of the defined types were placed in this encounter.  Follow-up plan:   No follow-ups on file.     {There is no content from the last Plan section.}   Recent Visits No visits were found meeting these conditions. Showing recent visits within past 90 days and meeting all other requirements Future Appointments Date Type Provider Dept  01/29/24 Appointment Manda Holstad K, NP [facility]  Showing future appointments within next 90 days and meeting all other requirements  I discussed the  assessment and treatment plan with the patient. The patient was provided an opportunity to ask questions and all were answered. The patient agreed with the plan and demonstrated an understanding of the instructions.  Patient advised to call back or seek an in-person evaluation if the symptoms or condition worsens.  Duration of encounter: *** minutes.  Total time on encounter, as per AMA guidelines included both the face-to-face and non-face-to-face time personally spent by the physician and/or other qualified health care professional(s) on the day of the encounter (includes time in activities that require the physician or other qualified health care professional and does not include time in activities normally performed by clinical staff). Physician's time may include the following activities when performed: Preparing to see the patient (e.g., pre-charting review of records, searching for previously ordered imaging, lab work, and nerve conduction tests) Review of prior analgesic pharmacotherapies. Reviewing PMP Interpreting ordered tests (e.g., lab work, imaging, nerve conduction tests)  Performing post-procedure evaluations, including interpretation of diagnostic procedures Obtaining and/or reviewing separately obtained history Performing a medically appropriate examination and/or evaluation Counseling and educating the patient/family/caregiver Ordering medications, tests, or procedures Referring and communicating with other health care professionals (when not separately reported) Documenting clinical information in the electronic or other health record Independently interpreting results (not separately reported) and communicating results to the patient/ family/caregiver Care coordination (not separately reported)  Note by: Rishard Delange K Shaquila Sigman, NP (TTS and AI technology used. I apologize for any typographical errors that were not detected and corrected.) Date: 01/29/2024; Time: 3:25 PM

## 2024-01-29 ENCOUNTER — Ambulatory Visit
Payer: BC Managed Care – PPO | Attending: Student in an Organized Health Care Education/Training Program | Admitting: Nurse Practitioner

## 2024-01-29 ENCOUNTER — Encounter: Payer: Self-pay | Admitting: Nurse Practitioner

## 2024-01-29 VITALS — BP 126/96 | HR 89 | Temp 97.7°F | Ht 62.0 in | Wt 94.0 lb

## 2024-01-29 DIAGNOSIS — Z79899 Other long term (current) drug therapy: Secondary | ICD-10-CM | POA: Insufficient documentation

## 2024-01-29 DIAGNOSIS — G603 Idiopathic progressive neuropathy: Secondary | ICD-10-CM | POA: Diagnosis not present

## 2024-01-29 DIAGNOSIS — R202 Paresthesia of skin: Secondary | ICD-10-CM | POA: Diagnosis present

## 2024-01-29 DIAGNOSIS — G894 Chronic pain syndrome: Secondary | ICD-10-CM | POA: Insufficient documentation

## 2024-01-29 DIAGNOSIS — G608 Other hereditary and idiopathic neuropathies: Secondary | ICD-10-CM | POA: Insufficient documentation

## 2024-01-29 DIAGNOSIS — G629 Polyneuropathy, unspecified: Secondary | ICD-10-CM | POA: Insufficient documentation

## 2024-01-29 MED ORDER — PREGABALIN 100 MG PO CAPS: 100.0000 mg | ORAL_CAPSULE | Freq: Every day | ORAL | 1 refills | Status: DC

## 2024-01-29 MED ORDER — TRAMADOL HCL ER 200 MG PO TB24
200.0000 mg | ORAL_TABLET | Freq: Every day | ORAL | 5 refills | Status: DC
Start: 2024-01-29 — End: 2024-04-21

## 2024-01-29 NOTE — Progress Notes (Signed)
 Safety precautions to be maintained throughout the outpatient stay will include: orient to surroundings, keep bed in low position, maintain call bell within reach at all times, provide assistance with transfer out of bed and ambulation.   Nursing Pain Medication Assessment:  Safety precautions to be maintained throughout the outpatient stay will include: orient to surroundings, keep bed in low position, maintain call bell within reach at all times, provide assistance with transfer out of bed and ambulation.  Medication Inspection Compliance: Pill count conducted under aseptic conditions, in front of the patient. Neither the pills nor the bottle was removed from the patient's sight at any time. Once count was completed pills were immediately returned to the patient in their original bottle.  Medication: Tramadol  (Ultram ) Pill/Patch Count:  2 of 30 pills remain Pill/Patch Appearance: Markings consistent with prescribed medication Bottle Appearance: Standard pharmacy container. Clearly labeled. Filled Date: 4 / 7 / 2025 Last Medication intake:  Today

## 2024-02-29 ENCOUNTER — Encounter: Payer: Self-pay | Admitting: Internal Medicine

## 2024-02-29 ENCOUNTER — Inpatient Hospital Stay (HOSPITAL_BASED_OUTPATIENT_CLINIC_OR_DEPARTMENT_OTHER): Payer: BC Managed Care – PPO | Admitting: Internal Medicine

## 2024-02-29 ENCOUNTER — Inpatient Hospital Stay: Payer: BC Managed Care – PPO | Attending: Internal Medicine

## 2024-02-29 VITALS — BP 119/89 | HR 93 | Temp 97.2°F | Resp 16 | Ht 62.0 in | Wt 98.4 lb

## 2024-02-29 DIAGNOSIS — Z9221 Personal history of antineoplastic chemotherapy: Secondary | ICD-10-CM | POA: Diagnosis not present

## 2024-02-29 DIAGNOSIS — Z1722 Progesterone receptor negative status: Secondary | ICD-10-CM | POA: Diagnosis not present

## 2024-02-29 DIAGNOSIS — Z9011 Acquired absence of right breast and nipple: Secondary | ICD-10-CM | POA: Diagnosis not present

## 2024-02-29 DIAGNOSIS — G620 Drug-induced polyneuropathy: Secondary | ICD-10-CM | POA: Diagnosis not present

## 2024-02-29 DIAGNOSIS — C50411 Malignant neoplasm of upper-outer quadrant of right female breast: Secondary | ICD-10-CM

## 2024-02-29 DIAGNOSIS — Z171 Estrogen receptor negative status [ER-]: Secondary | ICD-10-CM | POA: Diagnosis not present

## 2024-02-29 DIAGNOSIS — Z1732 Human epidermal growth factor receptor 2 negative status: Secondary | ICD-10-CM | POA: Diagnosis not present

## 2024-02-29 DIAGNOSIS — Z800 Family history of malignant neoplasm of digestive organs: Secondary | ICD-10-CM | POA: Insufficient documentation

## 2024-02-29 LAB — CBC WITH DIFFERENTIAL (CANCER CENTER ONLY)
Abs Immature Granulocytes: 0 10*3/uL (ref 0.00–0.07)
Basophils Absolute: 0 10*3/uL (ref 0.0–0.1)
Basophils Relative: 1 %
Eosinophils Absolute: 0.2 10*3/uL (ref 0.0–0.5)
Eosinophils Relative: 6 %
HCT: 38.5 % (ref 36.0–46.0)
Hemoglobin: 12.9 g/dL (ref 12.0–15.0)
Immature Granulocytes: 0 %
Lymphocytes Relative: 41 %
Lymphs Abs: 1.5 10*3/uL (ref 0.7–4.0)
MCH: 30.8 pg (ref 26.0–34.0)
MCHC: 33.5 g/dL (ref 30.0–36.0)
MCV: 91.9 fL (ref 80.0–100.0)
Monocytes Absolute: 0.3 10*3/uL (ref 0.1–1.0)
Monocytes Relative: 8 %
Neutro Abs: 1.6 10*3/uL — ABNORMAL LOW (ref 1.7–7.7)
Neutrophils Relative %: 44 %
Platelet Count: 188 10*3/uL (ref 150–400)
RBC: 4.19 MIL/uL (ref 3.87–5.11)
RDW: 11.6 % (ref 11.5–15.5)
WBC Count: 3.7 10*3/uL — ABNORMAL LOW (ref 4.0–10.5)
nRBC: 0 % (ref 0.0–0.2)

## 2024-02-29 LAB — CMP (CANCER CENTER ONLY)
ALT: 18 U/L (ref 0–44)
AST: 21 U/L (ref 15–41)
Albumin: 4.3 g/dL (ref 3.5–5.0)
Alkaline Phosphatase: 54 U/L (ref 38–126)
Anion gap: 6 (ref 5–15)
BUN: 24 mg/dL — ABNORMAL HIGH (ref 6–20)
CO2: 28 mmol/L (ref 22–32)
Calcium: 8.8 mg/dL — ABNORMAL LOW (ref 8.9–10.3)
Chloride: 102 mmol/L (ref 98–111)
Creatinine: 0.77 mg/dL (ref 0.44–1.00)
GFR, Estimated: >60 mL/min (ref >60)
Glucose, Bld: 161 mg/dL — ABNORMAL HIGH (ref 70–99)
Potassium: 3.8 mmol/L (ref 3.5–5.1)
Sodium: 136 mmol/L (ref 135–145)
Total Bilirubin: 0.5 mg/dL (ref 0.0–1.2)
Total Protein: 7.1 g/dL (ref 6.5–8.1)

## 2024-02-29 NOTE — Progress Notes (Signed)
 [facility] CONSULT NOTE  Patient Care Team: Bayard Bottcher as PCP - General (Physician Assistant) Gwyn Leos, [name] Cephus Collin, MD as Consulting Physician (Pain Medicine) Eldred Grego, MD as Consulting Physician (General Surgery) Dillingham, Lindaann Requena, DO as Attending Physician (Plastic Surgery)  CHIEF COMPLAINTS/PURPOSE OF CONSULTATION: Breast cancer  #  Oncology History Overview Note  # OCT 2021-RIGHT BREAST- TRIPLE NEGATIVE IMC; [US /mammo-75mm]; Dr.Cintron; OCT 2021-USThere is a 7 mm mass in the right breast at 10 o'clock, favored to represent a complicated cyst;  No evidence of right axillary lymphadenopathy].   # NOV 2021- Stage I pT1bp N0 [s/p mastectomy; Dr.Cintron]; G-3;   # DEC 17th, 2021- AC x4; 11/29/2020- Taxol  weekly [60mg /m2-3 W-ON & 1 W-OFF sec to PN]  # 2018-Small Fibre neuropathy [Dr.Shah]- skin biopsy- on cymblata+ Lyrica   # DEC 2021-status post evaluation with reproductive endocrinology [9-10% chance of viable pregnancy] # # SURVIVORSHIP:   # GENETICS: BARD  DIAGNOSIS: Breast cancer-triple negative  STAGE:  I       ;  GOALS: cure  CURRENT/MOST RECENT THERAPY : AC-T    Carcinoma of upper-outer quadrant of right breast in female, estrogen receptor negative (HCC)  07/13/2020 Initial Diagnosis   Carcinoma of upper-outer quadrant of right breast in female, estrogen receptor negative (HCC)    Genetic Testing   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 The report date is 08/02/2020.  The Multi-Cancer Panel offered by [facility] includes sequencing and/or deletion duplication testing of the following 85 genes: AIP, ALK, APC, ATM, AXIN2,BAP1,  BARD1, BLM, BMPR1A, BRCA1, BRCA2, BRIP1, CASR, CDC73, CDH1, CDK4, CDKN1B, CDKN1C, CDKN2A (p14ARF), CDKN2A (p16INK4a), CEBPA, CHEK2, CTNNA1, DICER1, DIS3L2, EGFR (c.2369C>T, p.Thr790Met variant only),  EPCAM (Deletion/duplication testing only), FH, FLCN, GATA2, GPC3, GREM1 (Promoter region deletion/duplication testing only), HOXB13 (c.251G>A, p.Gly84Glu), HRAS, KIT, MAX, MEN1, MET, MITF (c.952G>A, p.Glu318Lys variant only), MLH1, MSH2, MSH3, MSH6, MUTYH, NBN, NF1, NF2, NTHL1, PALB2, PDGFRA, PHOX2B, PMS2, POLD1, POLE, POT1, PRKAR1A, PTCH1, PTEN, RAD50, RAD51C, RAD51D, RB1, RECQL4, RET, RNF43, RUNX1, SDHAF2, SDHA (sequence changes only), SDHB, SDHC, SDHD, SMAD4, SMARCA4, SMARCB1, SMARCE1, STK11, SUFU, TERC, TERT, TMEM127, TP53, TSC1, TSC2, VHL, WRN and WT1.    09/10/2020 - 01/17/2021 Chemotherapy   Patient is on Treatment Plan : BREAST ADJUVANT DOSE DENSE AC q14d / PACLitaxel  q7d       HISTORY OF PRESENTING ILLNESS: Alone.  Ambulating independently.  Jaretssi Kraker 48 y.o.  female   with stage I triple negative breast cancer s/p adjuvant chemotherapy is here for follow-up.  Continues to have neuropathy. Noted to have improvement of her neuropathy in the hands.    Review of Systems  Constitutional:  Positive for malaise/fatigue. Negative for chills, diaphoresis, fever and weight loss.  HENT:  Negative for nosebleeds and sore throat.   Eyes:  Negative for double vision.  Respiratory:  Negative for cough, hemoptysis, sputum production, shortness of breath and wheezing.   Cardiovascular:  Negative for chest pain, palpitations, orthopnea and leg swelling.  Gastrointestinal:  Negative for abdominal pain, blood in stool, diarrhea, heartburn, melena and vomiting.  Genitourinary:  Negative for dysuria, frequency and urgency.  Musculoskeletal:  Positive for joint pain and myalgias. Negative for back pain.  Skin:  Positive for itching and rash.  Neurological:  Positive for tingling. Negative for dizziness, focal weakness, weakness and headaches.  Endo/Heme/Allergies:  Does not bruise/bleed easily.  Psychiatric/Behavioral:  Negative for depression. The patient is not nervous/anxious and  does not have  insomnia.      MEDICAL HISTORY:  Past Medical History:  Diagnosis Date   Breast cancer (HCC) 08/05/2020   Carcinoma of upper-outer quadrant of right breast in female, estrogen receptor negative (HCC) 07/13/2020   Complication of anesthesia    hard to wake up-very dizzy feeling like she was going to pass out   H/O foot surgery 08/2018   History of kidney stones    h/o   Hypertension    PONV (postoperative nausea and vomiting)    n/v aftter kidney stone surgery    SURGICAL HISTORY: Past Surgical History:  Procedure Laterality Date   BREAST BIOPSY Right 07/06/2020   u/s bx Q clip  INVASIVE MAMMARY CARCINOMA, NO SPECIAL   BREAST RECONSTRUCTION WITH PLACEMENT OF TISSUE EXPANDER AND FLEX HD (ACELLULAR HYDRATED DERMIS) Right 08/05/2020   Procedure: RIGHT BREAST RECONSTRUCTION WITH PLACEMENT OF TISSUE EXPANDER AND FLEX HD (ACELLULAR HYDRATED DERMIS);  Surgeon: Thornell Flirt, DO;  Location: [facility];  Service: Plastics;  Laterality: Right;   BREAST REDUCTION WITH MASTOPEXY     BUNIONECTOMY Right    titanium pin   CERVICAL CONE BIOPSY     COLONOSCOPY  01/24/2023   at [facility]   DILATION AND EVACUATION N/A 03/12/2018   Procedure: DILATATION AND EVACUATION;  Surgeon: Prescilla Brod, MD;  Location: [facility];  Service: Gynecology;  Laterality: N/A;   KIDNEY STONE SURGERY     LAPAROSCOPIC OVARIAN CYSTECTOMY Right 06/07/2019   Procedure: LAPAROSCOPIC OVARIAN CYSTECTOMY;  Surgeon: Prescilla Brod, MD;  Location: [facility];  Service: Gynecology;  Laterality: Right;   LAPAROSCOPY N/A 06/07/2019   Procedure: LAPAROSCOPY Isabel Many BIOPSIES;  Surgeon: Prescilla Brod, MD;  Location: [facility];  Service: Gynecology;  Laterality: N/A;   MASTECTOMY Right 08/05/2020   RIGHT mastectomy with implant 03/09/2021   MASTOPEXY Left 03/09/2021   Procedure: MASTOPEXY LEFT BREAST;  Surgeon: Thornell Flirt, DO;  Location: [facility];  Service: Plastics;  Laterality: Left;    PORTACATH PLACEMENT Left 08/05/2020   Procedure: INSERTION PORT-A-CATH;  Surgeon: Eldred Grego, MD;  Location: [facility];  Service: General;  Laterality: Left;   RECONSTRUCTION BREAST IMMEDIATE / DELAYED W/ TISSUE EXPANDER Right 03/09/2021   REMOVAL OF TISSUE EXPANDER AND PLACEMENT OF IMPLANT Right 03/09/2021   Procedure: Removal of right breast expander with placement of implant;  Surgeon: Thornell Flirt, DO;  Location: [facility];  Service: Plastics;  Laterality: Right;  2.5 hours   TOTAL MASTECTOMY Right 08/05/2020   Procedure: TOTAL MASTECTOMY w/ Sentinel Node;  Surgeon: Eldred Grego, MD;  Location: [facility];  Service: General;  Laterality: Right;    SOCIAL HISTORY: Social History   Socioeconomic History   Marital status: Married    Spouse name: Doug   Number of children: 0   Years of education: Not on file   Highest education level: Not on file  Occupational History   Occupation: Regulatory affairs officer: [employer]  Tobacco Use   Smoking status: Never   Smokeless tobacco: Never  Vaping Use   Vaping status: Never Used  Substance and Sexual Activity   Alcohol use: Not Currently   Drug use: Never   Sexual activity: Yes    Birth control/protection: Condom  Other Topics Concern   Not on file  Social History Narrative   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 No children. Lives in [place].    Social Drivers of Health  Financial Resource Strain: Low Risk  (08/30/2020)   Overall Financial Resource Strain (CARDIA)    Difficulty of Paying Living Expenses: Not hard at all  Food Insecurity: Not on file  Transportation Needs: No Transportation Needs (08/30/2020)   PRAPARE - Administrator, Civil Service (Medical): No    Lack of Transportation (Non-Medical): No  Physical Activity: Not on file  Stress: Stress Concern Present (08/30/2020)   [employer] of Occupational Health  - Occupational Stress Questionnaire    Feeling of Stress : Very much  Social Connections: Not on file  Intimate Partner Violence: Not on file    FAMILY HISTORY: Family History  Problem Relation Age of Onset   Cancer Other        great aunt- GI cancer   Breast cancer Neg Hx     ALLERGIES:  has no known allergies.  MEDICATIONS:  Current Outpatient Medications  Medication Sig Dispense Refill   ALPHA-LIPOIC ACID PO Take 500 mg by mouth daily.      amLODipine (NORVASC) 5 MG tablet Take 5 mg by mouth daily.     amphetamine -dextroamphetamine  (ADDERALL ) 20 MG tablet Take 20 mg by mouth 2 (two) times daily.     estradiol (ESTRACE) 0.1 MG/GM vaginal cream Place 1 Applicatorful vaginally 3 (three) times a week.     pregabalin  (LYRICA ) 100 MG capsule Take 1 capsule (100 mg total) by mouth daily. 180 capsule 1   Probiotic Product (PROBIOTIC ADVANCED PO) Take 1 capsule by mouth daily.      traMADol  (ULTRAM -ER) 200 MG 24 hr tablet Take 1 tablet (200 mg total) by mouth daily. 30 tablet 5   traZODone (DESYREL) 50 MG tablet Take 50 mg by mouth at bedtime.     trolamine salicylate (ASPERCREME) 10 % cream Apply 1 application topically as needed for muscle pain.     No current facility-administered medications for this visit.      Aaron Aas  PHYSICAL EXAMINATION: ECOG PERFORMANCE STATUS: 0 - Asymptomatic  Vitals:   02/29/24 1457  BP: 119/89  Pulse: 93  Resp: 16  Temp: (!) 97.2 F (36.2 C)  SpO2: 99%   Filed Weights   02/29/24 1457  Weight: 98 lb 6.4 oz (44.6 kg)     Physical Exam Constitutional:      Comments: Alone; ambulating independently.   HENT:     Head: Normocephalic and atraumatic.     Mouth/Throat:     Pharynx: No oropharyngeal exudate.  Eyes:     Pupils: Pupils are equal, round, and reactive to light.  Cardiovascular:     Rate and Rhythm: Normal rate and regular rhythm.  Pulmonary:     Effort: Pulmonary effort is normal. No respiratory distress.     Breath sounds:  Normal breath sounds. No wheezing.  Abdominal:     General: Bowel sounds are normal. There is no distension.     Palpations: Abdomen is soft. There is no mass.     Tenderness: There is no abdominal tenderness. There is no guarding or rebound.  Musculoskeletal:        General: No tenderness. Normal range of motion.     Cervical back: Normal range of motion and neck supple.  Skin:    General: Skin is warm.  Neurological:     Mental Status: She is alert and oriented to person, place, and time.  Psychiatric:        Mood and Affect: Affect normal.    LABORATORY DATA:  I have reviewed  the data as listed Lab Results  Component Value Date   WBC 3.7 (L) 02/29/2024   HGB 12.9 02/29/2024   HCT 38.5 02/29/2024   MCV 91.9 02/29/2024   PLT 188 02/29/2024   Recent Labs    02/29/24 1501  NA 136  K 3.8  CL 102  CO2 28  GLUCOSE 161*  BUN 24*  CREATININE 0.77  CALCIUM 8.8*  GFRNONAA >60  PROT 7.1  ALBUMIN 4.3  AST 21  ALT 18  ALKPHOS 54  BILITOT 0.5    RADIOGRAPHIC STUDIES: I have personally reviewed the radiological images as listed and agreed with the findings in the report. No results found.  ASSESSMENT & PLAN:   Carcinoma of upper-outer quadrant of right breast in female, estrogen receptor negative (HCC) # Stage I triple negative breast cancer on adjuvant chemotherapy with s/p 4 cycles of AC; Taxol  s/p 6 treatments. DISCONTINUED further Taxol  chemotherapy given worsening neuropathy.   JAN 2025-  UNI LAT LEFT-MAMMO- WNL [Dr.cintron]. Stable.  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  On Tramadol  ER 200 mg; improvement noted but not resolved. Stable.   # Mild hypocalcemia- recommend OTC ca+ vit D- one a day.    # ADD worsened with chemotherapy/ongoing fatigue. Defer to Neurology; Dr.Shah.  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 Fawn Hooks 2022]-hold off prophylactic BSO.   # Mil  leucopenia- monitor for now.   #Since patient is clinically stable I think is reasonable for the patient to follow-up with PCP/can follow-up with us  as needed.  Patient comfortable with the plan; to call us  if any questions or concerns in the interim.   # DISPOSITION:  # follow up as needed-Dr.B   All questions were answered. The patient/family knows to call the clinic with any problems, questions or concerns.    Gwyn Leos, MD 02/29/2024 3:43 PM

## 2024-02-29 NOTE — Assessment & Plan Note (Addendum)
#   Stage I triple negative breast cancer on adjuvant chemotherapy with s/p 4 cycles of AC; Taxol  s/p 6 treatments. DISCONTINUED further Taxol  chemotherapy given worsening neuropathy.   JAN 2025-  UNI LAT LEFT-MAMMO- WNL [Dr.cintron]. Stable. Continue follow up with surgery.   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  On Tramadol  ER 200 mg; improvement noted but not resolved. Stable.   # Mild hypocalcemia- recommend OTC ca+ vit D- one a day.    # ADD worsened with chemotherapy/ongoing fatigue. Defer to Neurology; Dr.Shah.  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 Fawn Hooks 2022]; Dr.Beasley-hold off prophylactic BSO.   # Mil leucopenia- monitor for now.   #Since patient is clinically stable I think is reasonable for the patient to follow-up with PCP/can follow-up with us  as needed.  Patient comfortable with the plan; to call us  if any questions or concerns in the interim.   # DISPOSITION:  # follow up as needed-Dr.B

## 2024-02-29 NOTE — Progress Notes (Signed)
 Mammogram 10/10/23.

## 2024-04-21 ENCOUNTER — Encounter: Payer: Self-pay | Admitting: Nurse Practitioner

## 2024-04-21 ENCOUNTER — Ambulatory Visit: Attending: Nurse Practitioner | Admitting: Nurse Practitioner

## 2024-04-21 DIAGNOSIS — G608 Other hereditary and idiopathic neuropathies: Secondary | ICD-10-CM | POA: Diagnosis not present

## 2024-04-21 DIAGNOSIS — G629 Polyneuropathy, unspecified: Secondary | ICD-10-CM | POA: Insufficient documentation

## 2024-04-21 DIAGNOSIS — G894 Chronic pain syndrome: Secondary | ICD-10-CM | POA: Insufficient documentation

## 2024-04-21 DIAGNOSIS — R202 Paresthesia of skin: Secondary | ICD-10-CM | POA: Insufficient documentation

## 2024-04-21 DIAGNOSIS — G603 Idiopathic progressive neuropathy: Secondary | ICD-10-CM | POA: Diagnosis present

## 2024-04-21 MED ORDER — TRAMADOL HCL ER 200 MG PO TB24: 200.0000 mg | ORAL_TABLET | Freq: Every day | ORAL | 5 refills | Status: DC

## 2024-04-21 NOTE — Progress Notes (Signed)
 PROVIDER NOTE: Interpretation of information contained herein should be left to medically-trained personnel. Specific patient instructions are provided elsewhere under Patient Instructions section of medical record. This document was created in part using AI and STT-dictation technology, any transcriptional errors that may result from this process are unintentional.  Patient: Beth Wells  Service: E/M   PCP: Beth Constance Sor, PA-C  DOB: 1976/01/03  DOS: 04/21/2024  Provider: Emmy MARLA Blanch, NP  MRN: [id number]  Delivery: Face-to-face  Specialty: Interventional Pain Management  Type: Established Patient  Setting: Ambulatory outpatient facility  Specialty designation: 09  Referring Prov.: Beth Constance Sor SHAUNNA*  Location: Outpatient office facility       History of present illness (HPI) [name], a 48 y.o. year old female, is here today because of her No primary diagnosis found.. Beth Wells primary complain today is Leg Pain (Bilateral, lower)  Pertinent problems: Beth Wells has Small fiber neuropathy; Paresthesias; and Chronic pain syndrome on their pertinent problem list.  Pain Assessment: Severity of Chronic pain is reported as a 2 /10. Location: Leg Right, Left, Lower/ . Onset: More than a month ago. Quality: Other (Comment), Burning (irritating). Timing: Constant. Modifying factor(s): medication, heat, Lidocaine  cream, topicals. Vitals:  height is 5' 2 (1.575 m) and weight is 96 lb (43.5 kg). Her temporal temperature is 97.4 F (36.3 C) (abnormal). Her blood pressure is 134/94 (abnormal) and her pulse is 98. Her respiration is 16 and oxygen saturation is 99%.  BMI: Estimated body mass index is 17.56 kg/m as calculated from the following:   Height as of this encounter: 5' 2 (1.575 m).   Weight as of this encounter: 96 lb (43.5 kg).  Last encounter: 01/29/2024. Last procedure: Visit date not found.  Reason for encounter: medication management. No change in medical history since  last visit.  Patient's pain is at baseline.  Patient continues multimodal pain regimen as prescribed.  States that it provides pain relief and improvement in functional status.  She takes 100 mg Lyrica  qhs.  She continues tramadol  200 mg ER.   Pharmacotherapy Assessment   Tramadol  (Ultram -ER) 200 mg 24-hour tablet daily for pain. MME=40 Pregabalin  (Lyrica ) 100 mg capsule daily Monitoring: [place] PMP: PDMP reviewed during this encounter.       Pharmacotherapy: No side-effects or adverse reactions reported. Compliance: No problems identified. Effectiveness: Clinically acceptable.  Beth Reda CROME, RN  04/21/2024  2:38 PM  Sign when Signing Visit Nursing Pain Medication Assessment:  Safety precautions to be maintained throughout the outpatient stay will include: orient to surroundings, keep bed in low position, maintain call bell within reach at all times, provide assistance with transfer out of bed and ambulation.  Medication Inspection Compliance: Pill count conducted under aseptic conditions, in front of the patient. Neither the pills nor the bottle was removed from the patient's sight at any time. Once count was completed pills were immediately returned to the patient in their original bottle.  Medication: Tramadol  (Ultram ) Pill/Patch Count: 12 of 30 pills/patches remain Pill/Patch Appearance: Markings consistent with prescribed medication Bottle Appearance: Standard pharmacy container. Clearly labeled. Filled Date: 07 / 10 / 2025 Last Medication intake:  TodaySafety precautions to be maintained throughout the outpatient stay will include: orient to surroundings, keep bed in low position, maintain call bell within reach at all times, provide assistance with transfer out of bed and ambulation.   UDS:  Summary  Date Value Ref Range Status  08/02/2023 Note  Final    Comment:    ====================================================================  [employer] [facility] [employer]  (MW) ==================================================================== Test                             Result       Flag       Units  Drug Present and Declared for Prescription Verification   Amphetamine                     >7299        EXPECTED   ng/mg creat    Amphetamine  is available as a schedule II prescription drug.    Tramadol                        221          EXPECTED   ng/mg creat   O-Desmethyltramadol            136          EXPECTED   ng/mg creat   N-Desmethyltramadol            >3650        EXPECTED   ng/mg creat    Source of tramadol  is a prescription medication. O-desmethyltramadol    and N-desmethyltramadol are expected metabolites of tramadol .  ==================================================================== Test                      Result    Flag   Units      Ref Range   Creatinine              137              mg/dL      >=79 ==================================================================== Declared Medications:  The flagging and interpretation on this report are based on the  following declared medications.  Unexpected results may arise from  inaccuracies in the declared medications.   **Note: The testing scope of this panel includes these medications:   Amphetamine  (Adderall )  Tramadol  (Ultram )   **Note: The testing scope of this panel does not include the  following reported medications:   Alpha Lipoic Acid (Alpha Lipoic Acid)  Capsaicin  (Zostrix)  Estradiol (Estrace)  Lisinopril  (Zestril )  Pregabalin  (Lyrica )  Probiotic  Topical  Trazodone (Desyrel) ==================================================================== For clinical consultation, please call [phone]. ====================================================================     No results found for: CBDTHCR No results found for: D8THCCBX No results found for: D9THCCBX  ROS  Constitutional: Denies any fever or chills Gastrointestinal: No reported hemesis,  hematochezia, vomiting, or acute GI distress Musculoskeletal: Denies any acute onset joint swelling, redness, loss of ROM, or weakness Neurological: No reported episodes of acute onset apraxia, aphasia, dysarthria, agnosia, amnesia, paralysis, loss of coordination, or loss of consciousness  Medication Review  Alpha-Lipoic Acid, Probiotic Product, amLODipine, amphetamine -dextroamphetamine , estradiol, pregabalin , traMADol , traZODone, and trolamine salicylate  History Review  Allergy: Ms. Osoria has no known allergies. Drug: Ms. Win  reports no history of drug use. Alcohol:  reports that she does not currently use alcohol. Tobacco:  reports that she has never smoked. She has never used smokeless tobacco. Social: Ms. Garraway  reports that she has never smoked. She has never used smokeless tobacco. She reports that she does not currently use alcohol. She reports that she does not use drugs. Medical:  has a past medical history of Breast cancer (HCC) (08/05/2020), Carcinoma of upper-outer quadrant of right breast in female, estrogen receptor negative (HCC) (07/13/2020), Complication of anesthesia, H/O foot  surgery (08/2018), History of kidney stones, Hypertension, and PONV (postoperative nausea and vomiting). Surgical: Ms. Colquhoun  has a past surgical history that includes Kidney stone surgery; Cervical cone biopsy; Dilation and evacuation (N/A, 03/12/2018); laparoscopy (N/A, 06/07/2019); Laparoscopic ovarian cystectomy (Right, 06/07/2019); Breast biopsy (Right, 07/06/2020); Bunionectomy (Right); Total mastectomy (Right, 08/05/2020); Portacath placement (Left, 08/05/2020); Breast reconstruction with placement of tissue expander and flex hd (acellular hydrated dermis) (Right, 08/05/2020); Removal of tissue expander and placement of implant (Right, 03/09/2021); Mastopexy (Left, 03/09/2021); Reconstruction breast immediate / delayed w/ tissue expander (Right, 03/09/2021); Mastectomy (Right, 08/05/2020); Breast  reduction with mastopexy; and Colonoscopy (01/24/2023). Family: family history includes Cancer in an other family member.  Laboratory Chemistry Profile   Renal Lab Results  Component Value Date   BUN 24 (H) 02/29/2024   CREATININE 0.77 02/29/2024   GFRAA >60 06/07/2019   GFRNONAA >60 02/29/2024    Hepatic Lab Results  Component Value Date   AST 21 02/29/2024   ALT 18 02/29/2024   ALBUMIN 4.3 02/29/2024   ALKPHOS 54 02/29/2024    Electrolytes Lab Results  Component Value Date   NA 136 02/29/2024   K 3.8 02/29/2024   CL 102 02/29/2024   CALCIUM 8.8 (L) 02/29/2024    Bone No results found for: VD25OH, [id number], CI6874NY7, CI7874NY7, 25OHVITD1, 25OHVITD2, 25OHVITD3, TESTOFREE, TESTOSTERONE  Inflammation (CRP: Acute Phase) (ESR: Chronic Phase) Lab Results  Component Value Date   LATICACIDVEN 1.4 10/18/2020         Note: Above Lab results reviewed.  Recent Imaging Review  MM 3D SCREENING MAMMOGRAM UNILATERAL LEFT BREAST CLINICAL DATA:  Screening.  EXAM: DIGITAL SCREENING UNILATERAL LEFT MAMMOGRAM WITH CAD AND TOMOSYNTHESIS  TECHNIQUE: Left screening digital craniocaudal and mediolateral oblique mammograms were obtained. Left screening digital breast tomosynthesis was performed. The images were evaluated with computer-aided detection.  COMPARISON:  Previous exam(s).  ACR Breast Density Category b: There are scattered areas of fibroglandular density.  FINDINGS: The patient has had a right mastectomy. There are no findings suspicious for malignancy.  IMPRESSION: No mammographic evidence of malignancy. A result letter of this screening mammogram will be mailed directly to the patient.  RECOMMENDATION: Screening mammogram in one year.  (Code:SM-L-25M)  BI-RADS CATEGORY  1: Negative.  Electronically Signed   By: Dobrinka  Dimitrova M.D.   On: 10/12/2023 09:33 Note: Reviewed        Physical Exam  Vitals: BP (!) 134/94 (Cuff Size:  Normal)   Pulse 98   Temp (!) 97.4 F (36.3 C) (Temporal)   Resp 16   Ht 5' 2 (1.575 m)   Wt 96 lb (43.5 kg)   SpO2 99%   BMI 17.56 kg/m  BMI: Estimated body mass index is 17.56 kg/m as calculated from the following:   Height as of this encounter: 5' 2 (1.575 m).   Weight as of this encounter: 96 lb (43.5 kg). Ideal: Female patients must weigh at least 45.5 kg to calculate ideal body weight General appearance: Well nourished, well developed, and well hydrated. In no apparent acute distress Mental status: Alert, oriented x 3 (person, place, & time)       Respiratory: No evidence of acute respiratory distress Eyes: PERLA   Assessment   Diagnosis Status  1. Chronic pain syndrome   2. Small fiber neuropathy   3. Idiopathic progressive neuropathy   4. Idiopathic small fiber sensory neuropathy   5. Paresthesias    Controlled Controlled Controlled   Updated Problems: No problems updated.  Plan of Care  Problem-specific:  Assessment and Plan We will continue on current medication regimen.  Prescribing drug monitoring (PDMP) consistent with prescribed medication.  UDS up-to-date.  No other issues or concern related to this visit.  Schedule follow-up in 90 days for medication management.  Ms. Seretha Estabrooks has a current medication list which includes the following long-term medication(s): amlodipine, amphetamine -dextroamphetamine , pregabalin , and trazodone.  Pharmacotherapy (Medications Ordered): Meds ordered this encounter  Medications   traMADol  (ULTRAM -ER) 200 MG 24 hr tablet    Sig: Take 1 tablet (200 mg total) by mouth daily. Please Fill up on 04/25/2024 for only [place] ([place]24).    Dispense:  30 tablet    Refill:  5    For chronic pain syndrome   Orders:  No orders of the defined types were placed in this encounter.       Return in about 3 months (around 07/22/2024) for (F2F), (MM), Beth Blanch NP.    Recent  Visits Date Type Provider Dept  01/29/24 Office Visit Ermon Sagan K, NP [facility]  Showing recent visits within past 90 days and meeting all other requirements Today's Visits Date Type Provider Dept  04/21/24 Office Visit Tahjae Clausing K, NP [facility]  Showing today's visits and meeting all other requirements Future Appointments Date Type Provider Dept  07/14/24 Appointment Ciani Rutten K, NP [facility]  Showing future appointments within next 90 days and meeting all other requirements  I discussed the assessment and treatment plan with the patient. The patient was provided an opportunity to ask questions and all were answered. The patient agreed with the plan and demonstrated an understanding of the instructions.  Patient advised to call back or seek an in-person evaluation if the symptoms or condition worsens.  Duration of encounter: 30 minutes.  Total time on encounter, as per AMA guidelines included both the face-to-face and non-face-to-face time personally spent by the physician and/or other qualified health care professional(s) on the day of the encounter (includes time in activities that require the physician or other qualified health care professional and does not include time in activities normally performed by clinical staff). Physician's time may include the following activities when performed: Preparing to see the patient (e.g., pre-charting review of records, searching for previously ordered imaging, lab work, and nerve conduction tests) Review of prior analgesic pharmacotherapies. Reviewing PMP Interpreting ordered tests (e.g., lab work, imaging, nerve conduction tests) Performing post-procedure evaluations, including interpretation of diagnostic procedures Obtaining and/or reviewing separately obtained history Performing a medically appropriate examination and/or evaluation Counseling and educating the patient/family/caregiver Ordering  medications, tests, or procedures Referring and communicating with other health care professionals (when not separately reported) Documenting clinical information in the electronic or other health record Independently interpreting results (not separately reported) and communicating results to the patient/ family/caregiver Care coordination (not separately reported)  Note by: Amijah Timothy K Jadyn Brasher, NP (TTS and AI technology used. I apologize for any typographical errors that were not detected and corrected.) Date: 04/21/2024; Time: 3:21 PM

## 2024-04-21 NOTE — Progress Notes (Signed)
 Nursing Pain Medication Assessment:  Safety precautions to be maintained throughout the outpatient stay will include: orient to surroundings, keep bed in low position, maintain call bell within reach at all times, provide assistance with transfer out of bed and ambulation.  Medication Inspection Compliance: Pill count conducted under aseptic conditions, in front of the patient. Neither the pills nor the bottle was removed from the patient's sight at any time. Once count was completed pills were immediately returned to the patient in their original bottle.  Medication: Tramadol  (Ultram ) Pill/Patch Count: 12 of 30 pills/patches remain Pill/Patch Appearance: Markings consistent with prescribed medication Bottle Appearance: Standard pharmacy container. Clearly labeled. Filled Date: 07 / 10 / 2025 Last Medication intake:  TodaySafety precautions to be maintained throughout the outpatient stay will include: orient to surroundings, keep bed in low position, maintain call bell within reach at all times, provide assistance with transfer out of bed and ambulation.

## 2024-07-14 ENCOUNTER — Encounter: Payer: Self-pay | Admitting: Nurse Practitioner

## 2024-07-14 ENCOUNTER — Ambulatory Visit: Attending: Nurse Practitioner | Admitting: Nurse Practitioner

## 2024-07-14 DIAGNOSIS — G629 Polyneuropathy, unspecified: Secondary | ICD-10-CM | POA: Diagnosis not present

## 2024-07-14 DIAGNOSIS — G894 Chronic pain syndrome: Secondary | ICD-10-CM | POA: Diagnosis not present

## 2024-07-14 DIAGNOSIS — R202 Paresthesia of skin: Secondary | ICD-10-CM | POA: Insufficient documentation

## 2024-07-14 DIAGNOSIS — G608 Other hereditary and idiopathic neuropathies: Secondary | ICD-10-CM | POA: Insufficient documentation

## 2024-07-14 DIAGNOSIS — G603 Idiopathic progressive neuropathy: Secondary | ICD-10-CM | POA: Insufficient documentation

## 2024-07-14 MED ORDER — TRAMADOL HCL ER 200 MG PO TB24: 200.0000 mg | ORAL_TABLET | Freq: Every day | ORAL | 2 refills | Status: DC

## 2024-07-14 MED ORDER — PREGABALIN 100 MG PO CAPS: 100.0000 mg | ORAL_CAPSULE | Freq: Every day | ORAL | 1 refills | Status: DC

## 2024-07-14 NOTE — Progress Notes (Signed)
 PROVIDER NOTE: Interpretation of information contained herein should be left to medically-trained personnel. Specific patient instructions are provided elsewhere under Patient Instructions section of medical record. This document was created in part using AI and STT-dictation technology, any transcriptional errors that may result from this process are unintentional.  Patient: Beth Wells  Service: E/M   PCP: Perri Constance Sor, PA-C  DOB: April 14, 1976  DOS: 07/14/2024  Provider: Emmy MARLA Blanch, NP  MRN: [id number]  Delivery: Face-to-face  Specialty: Interventional Pain Management  Type: Established Patient  Setting: Ambulatory outpatient facility  Specialty designation: 09  Referring Prov.: Perri Constance Sor SHAUNNA*  Location: Outpatient office facility       History of present illness (HPI) [name], a 48 y.o. year old female, is here today because of her thigh pain (small fiber neuropathy).  Beth Wells primary complain today is Thigh Pain  (Small fiber neuropathy )  Pertinent problems: Beth Wells  has Small fiber neuropathy; Paresthesias; and Chronic pain syndrome on their pertinent problem list.  Pain Assessment: Severity of Chronic pain is reported as a 4 /10. Location: Leg (Thigh / Sometime Feet) Right, Left/Denies. Onset: More than a month ago. Quality: Burning (Patchy). Timing: Constant. Modifying factor(s): Pain Medication, Lidocaine  Roll On Gel, Cream. Vitals:  height is 5' 2 (1.575 m) and weight is 98 lb (44.5 kg). Her temporal temperature is 97.3 F (36.3 C) (abnormal). Her blood pressure is 134/90 (abnormal) and her pulse is 82. Her respiration is 18 and oxygen saturation is 100%.  BMI: Estimated body mass index is 17.92 kg/m as calculated from the following:   Height as of this encounter: 5' 2 (1.575 m).   Weight as of this encounter: 98 lb (44.5 kg).  Last encounter: 04/21/2024. Last procedure: Visit date not found.  Reason for encounter: medication management.    Discussed the use of AI scribe software for clinical note transcription with the patient, who gave verbal consent to proceed.  History of Present Illness   Beth Wells is a 48 year old female with small fiber neuropathy who presents with worsening thigh pain.  She experiences severe pain primarily in her thighs. The pain significantly impacts her quality of life, making her feel 'awful'.  She is currently taking Lyrica  for her neuropathy but has run out of her medication as [facility] [place] [facility] not [facility] in [facility]. She took her last dose last night. She is also prescribed tramadol .  She forgot to bring her pill bottle to the appointment, which is usually required for medication count. She has a 90-day prescription with two refills remaining.     Pharmacotherapy Assessment   Tramadol  (Ultram -ER) 200 mg 24-hour tablet daily for pain. MME=40 Pregabalin  (Lyrica ) 100 mg capsule daily Monitoring: Beth Wells: PDMP reviewed during this encounter.       Pharmacotherapy: No side-effects or adverse reactions reported. Compliance: No problems identified. Effectiveness: Clinically acceptable.  Beth Wells, Beth Wells  07/14/2024  2:55 PM  Sign when Signing Visit Nursing Pain Medication Assessment:  Safety precautions to be maintained throughout the outpatient stay will include: orient to surroundings, keep bed in low position, maintain call bell within reach at all times, provide assistance with transfer out of bed and ambulation.  Medication Inspection Compliance: Beth Wells did not comply with our request to bring her pills to be counted. She was reminded that bringing the medication bottles, even when empty, is a requirement.  Medication: None brought in. Pill/Patch Count: None available to be  counted. Bottle Appearance: No container available. Did not bring bottle(s) to appointment. Filled Date: N/A Last Medication intake:  Today    UDS:  Summary  Date Value Ref Range Status   08/02/2023 Note  Final    Comment:    ==================================================================== ToxASSURE Select 13 (MW) ==================================================================== Test                             Result       Flag       Units  Drug Present and Declared for Prescription Verification   Amphetamine                     >7299        EXPECTED   ng/mg creat    Amphetamine  is available as a schedule II prescription drug.    Tramadol                        221          EXPECTED   ng/mg creat   O-Desmethyltramadol            136          EXPECTED   ng/mg creat   N-Desmethyltramadol            >3650        EXPECTED   ng/mg creat    Source of tramadol  is a prescription medication. O-desmethyltramadol    and N-desmethyltramadol are expected metabolites of tramadol .  ==================================================================== Test                      Result    Flag   Units      Ref Range   Creatinine              137              mg/dL      >=79 ==================================================================== Declared Medications:  The flagging and interpretation on this report are based on the  following declared medications.  Unexpected results may arise from  inaccuracies in the declared medications.   **Note: The testing scope of this panel includes these medications:   Amphetamine  (Adderall )  Tramadol  (Ultram )   **Note: The testing scope of this panel does not include the  following reported medications:   Alpha Lipoic Acid (Alpha Lipoic Acid)  Capsaicin  (Zostrix)  Estradiol (Estrace)  Lisinopril  (Zestril )  Pregabalin  (Lyrica )  Probiotic  Topical  Trazodone (Desyrel) ==================================================================== For clinical consultation, please call [phone]. ====================================================================     No results found for: CBDTHCR No results found for:  D8THCCBX No results found for: D9THCCBX  ROS  Constitutional: Denies any fever or chills Gastrointestinal: No reported hemesis, hematochezia, vomiting, or acute GI distress Musculoskeletal: Thigh pain Neurological: No reported episodes of acute onset apraxia, aphasia, dysarthria, agnosia, amnesia, paralysis, loss of coordination, or loss of consciousness  Medication Review  Alpha-Lipoic Acid, Probiotic Product, amLODipine, amphetamine -dextroamphetamine , estradiol, pregabalin , traMADol , traZODone, and trolamine salicylate  History Review  Allergy: Beth Wells has no known allergies. Drug: Beth Wells  reports no history of drug use. Alcohol:  reports that she does not currently use alcohol. Tobacco:  reports that she has never smoked. She has never used smokeless tobacco. Social: Beth Wells  reports that she has never smoked. She has never used smokeless tobacco. She reports that she does not currently use alcohol. She reports that she  does not use drugs. Medical:  has a past medical history of Breast cancer (HCC) (08/05/2020), Carcinoma of upper-outer quadrant of right breast in female, estrogen receptor negative (HCC) (07/13/2020), Complication of anesthesia, H/O foot surgery (08/2018), History of kidney stones, Hypertension, and PONV (postoperative nausea and vomiting). Surgical: Beth Wells  has a past surgical history that includes Kidney stone surgery; Cervical cone biopsy; Dilation and evacuation (N/A, 03/12/2018); laparoscopy (N/A, 06/07/2019); Laparoscopic ovarian cystectomy (Right, 06/07/2019); Breast biopsy (Right, 07/06/2020); Bunionectomy (Right); Total mastectomy (Right, 08/05/2020); Portacath placement (Left, 08/05/2020); Breast reconstruction with placement of tissue expander and flex hd (acellular hydrated dermis) (Right, 08/05/2020); Removal of tissue expander and placement of implant (Right, 03/09/2021); Mastopexy (Left, 03/09/2021); Reconstruction breast immediate / delayed w/  tissue expander (Right, 03/09/2021); Mastectomy (Right, 08/05/2020); Breast reduction with mastopexy; and Colonoscopy (01/24/2023). Family: family history includes Cancer in an other family member.  Laboratory Chemistry Profile   Renal Lab Results  Component Value Date   BUN 24 (H) 02/29/2024   CREATININE 0.77 02/29/2024   GFRAA >60 06/07/2019   GFRNONAA >60 02/29/2024    Hepatic Lab Results  Component Value Date   AST 21 02/29/2024   ALT 18 02/29/2024   ALBUMIN 4.3 02/29/2024   ALKPHOS 54 02/29/2024    Electrolytes Lab Results  Component Value Date   NA 136 02/29/2024   K 3.8 02/29/2024   CL 102 02/29/2024   CALCIUM 8.8 (L) 02/29/2024    Bone No results found for: VD25OH, [id number], CI6874NY7, CI7874NY7, 25OHVITD1, 25OHVITD2, 25OHVITD3, TESTOFREE, TESTOSTERONE  Inflammation (CRP: Acute Phase) (ESR: Chronic Phase) Lab Results  Component Value Date   LATICACIDVEN 1.4 10/18/2020         Note: Above Lab results reviewed.  Recent Imaging Review  MM 3D SCREENING MAMMOGRAM UNILATERAL LEFT BREAST CLINICAL DATA:  Screening.  EXAM: DIGITAL SCREENING UNILATERAL LEFT MAMMOGRAM WITH CAD AND TOMOSYNTHESIS  TECHNIQUE: Left screening digital craniocaudal and mediolateral oblique mammograms were obtained. Left screening digital breast tomosynthesis was performed. The images were evaluated with computer-aided detection.  COMPARISON:  Previous exam(s).  ACR Breast Density Category b: There are scattered areas of fibroglandular density.  FINDINGS: The patient has had a right mastectomy. There are no findings suspicious for malignancy.  IMPRESSION: No mammographic evidence of malignancy. A result letter of this screening mammogram will be mailed directly to the patient.  RECOMMENDATION: Screening mammogram in one year.  (Code:[id number]-L-36M)  BI-RADS CATEGORY  1: Negative.  Electronically Signed   By: Dobrinka  Dimitrova M.D.   On: 10/12/2023  09:33 Note: Reviewed        Physical Exam  Vitals: BP (!) 134/90 (BP Location: Left Arm, Patient Position: Sitting, Cuff Size: Normal)   Pulse 82   Temp (!) 97.3 F (36.3 C) (Temporal)   Resp 18   Ht 5' 2 (1.575 m)   Wt 98 lb (44.5 kg)   SpO2 100%   BMI 17.92 kg/m  BMI: Estimated body mass index is 17.92 kg/m as calculated from the following:   Height as of this encounter: 5' 2 (1.575 m).   Weight as of this encounter: 98 lb (44.5 kg). Ideal: Female patients must weigh at least 45.5 kg to calculate ideal body weight General appearance: Well nourished, well developed, and well hydrated. In no apparent acute distress Mental status: Alert, oriented x 3 (person, place, & time)       Respiratory: No evidence of acute respiratory distress Eyes: PERLA  Musculoskeletal: Thigh pain due to small fiber neuropathy Assessment  Diagnosis Status  1. Chronic pain syndrome   2. Small fiber neuropathy   3. Idiopathic progressive neuropathy   4. Idiopathic small fiber sensory neuropathy   5. Paresthesias    Controlled Controlled Controlled   Updated Problems: No problems updated.  Plan of Care  Problem-specific:  Assessment and Plan    Small fiber neuropathy Small fiber neuropathy with severe and debilitating thigh pain. - [facility] - Advised [facility] to [facility] - Instructed her to message if Lyrica  is unavailable for alternative pharmacy.  Medications   traMADol  (ULTRAM -ER) 200 MG 24 hr tablet    Sig: Take 1 tablet (200 mg total) by mouth daily.    Dispense:  30 tablet    Refill:  2    For chronic pain syndrome   pregabalin  (LYRICA ) 100 MG capsule    Sig: Take 1 capsule (100 mg total) by mouth daily.    Dispense:  180 capsule    Refill:  1    Fill one day early if pharmacy is closed on scheduled refill date. May substitute for generic if available.    Chronic pain syndrome Chronic pain syndrome managed with tramadol ,  requires refill. - [facility] - Instructed her to bring medication bottle to next appointment for pill count. Patient's pain is well-controlled with tramadol  and Lyrica , will continue on current medication regimen.  Prescribing drug monitoring (PDMP) reviewed, findings consistent with the use of prescribed medication and no evidence of narcotic misuse or abuse. Routine UDS ordered today.  The patient was advised to drink more water to prevent opioid induced constipation.  Schedule follow-up in 90 days for medication management.  Idiopathic progressive neuropathy:  Medications   traMADol  (ULTRAM -ER) 200 MG 24 hr tablet    Sig: Take 1 tablet (200 mg total) by mouth daily.    Dispense:  30 tablet    Refill:  2    For chronic pain syndrome   pregabalin  (LYRICA ) 100 MG capsule    Sig: Take 1 capsule (100 mg total) by mouth daily.    Dispense:  180 capsule    Refill:  1    Fill one day early if pharmacy is closed on scheduled refill date. May substitute for generic if available.        Beth Wells has a current medication list which includes the following long-term medication(s): amlodipine, amphetamine -dextroamphetamine , trazodone, and pregabalin .  Pharmacotherapy (Medications Ordered): Meds ordered this encounter  Medications   traMADol  (ULTRAM -ER) 200 MG 24 hr tablet    Sig: Take 1 tablet (200 mg total) by mouth daily.    Dispense:  30 tablet    Refill:  2    For chronic pain syndrome   pregabalin  (LYRICA ) 100 MG capsule    Sig: Take 1 capsule (100 mg total) by mouth daily.    Dispense:  180 capsule    Refill:  1    Fill one day early if pharmacy is closed on scheduled refill date. May substitute for generic if available.   Orders:  Orders Placed This Encounter  Procedures   ToxASSURE Select 13 (MW), Urine    Volume: 30 ml(s). Minimum 3 ml of urine is needed. Document temperature of fresh sample. Indications: Long term (current) use of opiate  analgesic (S20.108)    Release to patient:   Immediate        Return in about 3 months (around 10/14/2024) for (F2F), (MM), Emmy Blanch NP.  Recent Visits Date Type Provider Dept  04/21/24 Office Visit Nyxon Strupp K, NP [facility]  Showing recent visits within past 90 days and meeting all other requirements Today's Visits Date Type Provider Dept  07/14/24 Office Visit Krystelle Prashad K, NP [facility]  Showing today's visits and meeting all other requirements Future Appointments Date Type Provider Dept  10/06/24 Appointment Jaheem Hedgepath K, NP [facility]  Showing future appointments within next 90 days and meeting all other requirements  I discussed the assessment and treatment plan with the patient. The patient was provided an opportunity to ask questions and all were answered. The patient agreed with the plan and demonstrated an understanding of the instructions.  Patient advised to call back or seek an in-person evaluation if the symptoms or condition worsens.  I personally spent a total of 30 minutes in the care of the patient today including preparing to see the patient, getting/reviewing separately obtained history, performing a medically appropriate exam/evaluation, counseling and educating, placing orders, referring and communicating with other health care professionals, documenting clinical information in the EHR, independently interpreting results, communicating results, and coordinating care.   Note by: Geneieve Duell K Kamariah Fruchter, NP (TTS and AI technology used. I apologize for any typographical errors that were not detected and corrected.) Date: 07/14/2024; Time: 3:34 PM

## 2024-07-14 NOTE — Progress Notes (Signed)
Nursing Pain Medication Assessment:  Safety precautions to be maintained throughout the outpatient stay will include: orient to surroundings, keep bed in low position, maintain call bell within reach at all times, provide assistance with transfer out of bed and ambulation.  Medication Inspection Compliance: Beth Wells did not comply with our request to bring her pills to be counted. She was reminded that bringing the medication bottles, even when empty, is a requirement.  Medication: None brought in. Pill/Patch Count: None available to be counted. Bottle Appearance: No container available. Did not bring bottle(s) to appointment. Filled Date: N/A Last Medication intake:  Today

## 2024-07-17 LAB — TOXASSURE SELECT 13 (MW), URINE

## 2024-08-05 ENCOUNTER — Encounter: Payer: Self-pay | Admitting: Internal Medicine

## 2024-08-05 ENCOUNTER — Telehealth: Payer: Self-pay | Admitting: Internal Medicine

## 2024-08-05 NOTE — Telephone Encounter (Signed)
 Dulcie from [employer] called to confirm our address and that the pt has been seen by Dr B - asked last date that the pt was seen by Dr B - [facility]

## 2024-08-18 ENCOUNTER — Other Ambulatory Visit: Payer: Self-pay | Admitting: General Surgery

## 2024-08-18 DIAGNOSIS — Z1231 Encounter for screening mammogram for malignant neoplasm of breast: Secondary | ICD-10-CM

## 2024-09-30 NOTE — Progress Notes (Signed)
 " Chief Complaint  Patient presents with   Medication Refill    ADHD    Subjective  Beth Wells is a 49 y.o. female who presents for Medication Refill (ADHD) HPI History of Present Illness A 49 year old female who presents for medication renewal.  She is here to renew her prescription for Adderall , which she takes twice a day. Her last prescription was filled on August 14, 2024, for sixty tablets. The medication is working well for her.  She has a history of using tramadol , which was prescribed by Caren Blanch in [place].  Review of Systems  Patient Active Problem List  Diagnosis   Essential hypertension   Attention deficit hyperactivity disorder (ADHD), predominantly inattentive type   History of nephrolithiasis   Hallux valgus, right   Hallux valgus, left   Sprain of metatarsophalangeal joint of right great toe   Bilateral foot pain   Idiopathic progressive neuropathy   Chronic pain syndrome   Paresthesias   Small fiber neuropathy   Torsion of right ovary and ovarian pedicle   History of abnormal cervical Pap smear   Idiopathic small fiber sensory neuropathy   Carcinoma of upper-outer quadrant of right breast in female, estrogen receptor negative (CMS/HHS-HCC)   Breast cancer (CMS/HHS-HCC)   BARD1 gene mutation positive   Acquired absence of breast   Leukopenia due to antineoplastic chemotherapy   Neutropenic fever ()   Invasive ductal carcinoma of breast, female, right (CMS/HHS-HCC)   Breast asymmetry following reconstructive surgery    Outpatient Medications Prior to Visit  Medication Sig Dispense Refill   alpha lipoic acid-herbal 305 150 mg Cap Take by mouth once daily     amLODIPine (NORVASC) 5 MG tablet TAKE 1 TABLET BY MOUTH EVERY DAY 90 tablet 3   estradioL (ESTRACE) 0.01 % (0.1 mg/gram) vaginal cream Place 2 g vaginally as directed Start 500 mg intravaginally daily for 2 weeks, then 500 mg one to three times per week 42.5 g 11    IBUPROFEN  ORAL Take by mouth     Lactobac no.41/Bifidobact no.7 (PROBIOTIC-10 ORAL) Take by mouth     lidocaine -prilocaine  (EMLA ) cream      multivitamin tablet Take 1 tablet by mouth once daily     pregabalin  (LYRICA ) 100 MG capsule Take 100 mg by mouth once daily     traMADoL  (ULTRAM ) 50 mg tablet Take 50 mg by mouth every 6 (six) hours as needed for Pain     traMADoL  (ULTRAM -ER) 200 MG ER tablet Take 200 mg by mouth once daily     traZODone (DESYREL) 50 MG tablet TAKE 1 TABLET (50 MG TOTAL) BY MOUTH AT BEDTIME 30 MINUTES- 1 HOUR BEFORE DESIRED SLEEP. 90 tablet 3   dextroamphetamine -amphetamine  (ADDERALL ) 20 mg tablet Take 1 tablet (20 mg total) by mouth 2 (two) times daily for 30 days 60 tablet 0   dextroamphetamine -amphetamine  (ADDERALL ) 20 mg tablet Take 1 tablet (20 mg total) by mouth 2 (two) times daily for 30 days 60 tablet 0   sodium, potassium, and magnesium (SUPREP) oral solution Take 1 Bottle by mouth as directed One kit contains 2 bottles.  Take both bottles at the times instructed by your provider. (Patient not taking: Reported on 06/24/2024) 354 mL 0   dextroamphetamine -amphetamine  (ADDERALL ) 20 mg tablet Take 1 tablet (20 mg total) by mouth 2 (two) times daily for 30 days 60 tablet 0   No facility-administered medications prior to visit.      Objective  Vitals:   09/30/24 1256  BP: 111/75  Pulse: 84  Weight: 46 kg (101 lb 6.4 oz)  PainSc: 0-No pain   Body mass index is 18.55 kg/m.  Home Vitals:     Physical Exam Physical Exam   Constitutional: alert, in NAD, and communicates well Eye exam: sclera anicteric. Neck: supple Respiratory: No respiratory distress  Neurological: normal muscle tone  Results      Assessment/Plan:   Assessment & Plan Attention-deficit hyperactivity disorder, predominantly inattentive type Adderall  XR 20 mg twice daily is renewed. She reports good efficacy of the medication. - Renewed Adderall  XR 20 mg twice daily  for 30 days. - Scheduled follow-up appointment with provider for next month. - Ensured compliance with stimulant medication contract and random drug testing requirements.  General Health Maintenance She is due for an annual exam, flu vaccine, and mammogram. Mammogram is scheduled for January 16th, 2026, with follow-up with Dr. Cesar on January 22nd, 2026. - Schedule annual exam. - Confirm need for flu vaccine. - Confirm mammogram appointment on January 16th, 2026. Diagnoses and all orders for this visit:  Attention deficit hyperactivity disorder (ADHD), predominantly inattentive type -     dextroamphetamine -amphetamine  (ADDERALL ) 20 mg tablet; Take 1 tablet (20 mg total) by mouth 2 (two) times daily for 30 days  Invasive ductal carcinoma of breast, female, right (CMS/HHS-HCC)    This visit was coded based on medical decision making (MDM).           Future Appointments     Date/Time Provider Department Center Visit Type   10/16/2024 9:00 AM Cintron Hayes Lucas Schanz, MD [facility] C RETURN 30   11/06/2024 7:30 AM (Arrive by 7:15 AM) Perri Constance Sor, PA [facility] OFFICE VISIT       There are no Patient Instructions on file for this visit.  An after visit summary was provided for the patient either in written format (printed) or through [facility].  This note has been created using automated tools and reviewed for accuracy by MARIO E OLMEDO.  "

## 2024-10-05 NOTE — Progress Notes (Unsigned)
 PROVIDER NOTE: Interpretation of information contained herein should be left to medically-trained personnel. Specific patient instructions are provided elsewhere under Patient Instructions section of medical record. This document was created in part using AI and STT-dictation technology, any transcriptional errors that may result from this process are unintentional.  Patient: Beth Wells  Service: E/M   PCP: Perri Constance Sor, PA-C  DOB: 07-Jan-1976  DOS: 10/06/2024  Provider: Emmy MARLA Blanch, NP  MRN: [id number]  Delivery: Face-to-face  Specialty: Interventional Pain Management  Type: Established Patient  Setting: Ambulatory outpatient facility  Specialty designation: 09  Referring Prov.: Perri Constance Sor, PA-C  Location: Outpatient office facility       History of present illness (HPI) Beth Wells, a 49 y.o. year old female, is here today because of her Small fiber neuropathy [G62.9]. Beth Wells primary complain today is Leg Pain  Pertinent problems: 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  Pain Assessment: Severity of Chronic pain is reported as a 3 /10. Location: Leg Left, Right/Into legs and thighs at times. Onset: More than a month ago. Quality: Burning. Timing: Constant. Modifying factor(s): Actvity, Heat, Hot shower. Vitals:  height is 5' 2 (1.575 m) and weight is 101 lb (45.8 kg). Her temporal temperature is 97.4 F (36.3 C) (abnormal). Her blood pressure is 132/90 (abnormal) and her pulse is 81. Her respiration is 18 and oxygen  saturation is 100%.  BMI: Estimated body mass index is 18.47 kg/m as calculated from the following:   Height as of this encounter: 5' 2 (1.575 m).   Weight as of this encounter: 101 lb (45.8 kg).  Last encounter: 07/14/2024. Last procedure: Visit date not found.  Reason for encounter: medication management. No change in medical history since last visit.  Patient's pain is at baseline.  Patient continues multimodal pain regimen as prescribed.  States that it provides pain relief and improvement in functional status.   Discussed the use of AI scribe software for clinical note transcription with the patient, who gave verbal consent to proceed.  History of Present Illness   Beth Wells is a 49 year old female with idiopathic small fiber neuropathy who presents with persistent burning pain in her legs and arms.  She experiences burning pain in her legs, arms, feet, chest, neck, and thighs, often occurring upon waking and described as a flare-up without a known cause. Prolonged standing exacerbates the pain in her feet, while prolonged sitting causes discomfort in her thighs.  She has a history of idiopathic small fiber neuropathy diagnosed in 2010. In 2022, she underwent chemotherapy for breast cancer, which included Taxol , and experienced significant pain in her feet after the sixth treatment out of a planned twelve. This pain has been managed with tramadol  and Lyrica , although she wishes she did not have to take these medications.  She has tried lidocaine  infusions with Dr. Marcelino, which initially provided some relief but eventually became ineffective. She has also consulted with neurologists, including a second opinion at [facility], but found no new treatment options. She has attempted chiropractic care, which included a recommended program, though details of its efficacy are not provided.  No diabetes. 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     Pharmacotherapy Assessment   Tramadol  (Ultram -ER) 200 mg 24-hour tablet daily for pain. MME=40 Pregabalin  (Lyrica ) 100 mg capsule daily Monitoring: Beth Wells PMP: PDMP reviewed during this encounter.       Pharmacotherapy: No side-effects or adverse reactions reported. Compliance: No problems identified. Effectiveness: Clinically acceptable.  Beth Wells, [place]  10/06/2024  3:02 PM  Sign when Signing Visit Nursing Pain Medication Assessment:  Safety precautions to be maintained throughout the outpatient stay will include: orient to surroundings, keep bed in low position, maintain call bell within reach at all times, provide assistance with transfer out of bed and ambulation.  Medication Inspection Compliance: Pill count conducted under aseptic conditions, in front of the patient. Neither the pills nor the bottle was removed from the patient's sight at any time. Once count was completed pills were immediately returned to the patient in their original bottle.  Medication: Tramadol  (Ultram ) Pill/Patch Count: 25 of 30 pills/patches remain Pill/Patch Appearance: Markings consistent with prescribed medication Bottle Appearance: Standard pharmacy container. Clearly labeled. Filled Date: 01 / 06 / 2026 Last Medication intake:  Today    UDS:  Summary  Date Value Ref Range Status  07/14/2024 FINAL  Final    Comment:    ==================================================================== [employer] Select 13 (MW) ==================================================================== Test                             Result       Flag       Units  Drug Present and Declared for Prescription Verification   Amphetamine                     >7092        EXPECTED   ng/mg creat    Amphetamine  is available as a schedule II prescription drug.    Tramadol                        >3546        EXPECTED   ng/mg creat    O-Desmethyltramadol            >3546        EXPECTED   ng/mg creat   N-Desmethyltramadol            >3546        EXPECTED   ng/mg creat    Source of tramadol  is a prescription medication. O-desmethyltramadol    and N-desmethyltramadol are expected metabolites of tramadol .  ==================================================================== Test                      Result    Flag   Units      Ref Range   Creatinine              141              mg/dL      >=79 ==================================================================== Declared Medications:  The flagging and interpretation on this report are based on the  following declared medications.  Unexpected results may arise from  inaccuracies in the declared medications.   **Note: The testing scope of this panel includes these medications:   Amphetamine  (Adderall )  Tramadol  (Ultram )   **Note: The testing scope of this panel does not include the  following reported medications:  Alpha Lipoic Acid (Alpha Lipoic Acid)  Amlodipine (Norvasc)  Estradiol (Estrace)  Pregabalin  (Lyrica )  Probiotic  Trazodone (Desyrel)  Trolamine Salicylate (topical) (Aspercreme) ==================================================================== For clinical consultation, please call [phone]. ====================================================================     No results found for: CBDTHCR No results found for: D8THCCBX No results found for: D9THCCBX  ROS  Constitutional: Denies any fever or chills Gastrointestinal: No reported hemesis, hematochezia, vomiting, or acute GI distress Musculoskeletal: Bilateral leg pain Neurological: No reported episodes of acute onset apraxia, aphasia, dysarthria, agnosia, amnesia, paralysis, loss of coordination, or loss of consciousness  Medication Review  Alpha-Lipoic Acid, Probiotic Product, amLODipine, amphetamine -dextroamphetamine , estradiol, pregabalin , traMADol , traZODone, and trolamine  salicylate  History Review  Allergy: Beth Wells has no known allergies. Drug: Beth Wells  reports no history of drug use. Alcohol:  reports that she does not currently use alcohol. Tobacco:  reports that she has never smoked. She has never used smokeless tobacco. Social: Beth Wells  reports that she has never smoked. She has never used smokeless tobacco. She reports that she does not currently use alcohol. She reports that she does not use drugs. Medical:  has a past medical history of Breast cancer (HCC) (08/05/2020), Carcinoma of upper-outer quadrant of right breast in female, estrogen receptor negative (HCC) (07/13/2020), Complication of anesthesia, H/O foot surgery (08/2018), History of kidney stones, Hypertension, and PONV (postoperative nausea and vomiting). Surgical: Beth Wells  has a past surgical history that includes Kidney stone surgery; Cervical cone biopsy; Dilation and evacuation (N/A, 03/12/2018); laparoscopy (N/A, 06/07/2019); Laparoscopic ovarian cystectomy (Right, 06/07/2019); Breast biopsy (Right, 07/06/2020); Bunionectomy (Right); Total mastectomy (Right, 08/05/2020); Portacath placement (Left, 08/05/2020); Breast reconstruction with placement of tissue expander and flex hd (acellular hydrated dermis) (Right, 08/05/2020); Removal of tissue expander and placement of implant (Right, 03/09/2021); Mastopexy (Left, 03/09/2021); Reconstruction breast immediate / delayed w/ tissue expander (Right, 03/09/2021); Mastectomy (Right, 08/05/2020); Breast reduction with mastopexy; and Colonoscopy (01/24/2023). Family: family history includes Cancer in an other family member.  Laboratory Chemistry Profile   Renal Lab Results  Component Value Date   BUN 24 (H) 02/29/2024   CREATININE 0.77 02/29/2024   GFRAA >60 06/07/2019   GFRNONAA >60 02/29/2024    Hepatic Lab Results  Component Value Date   AST 21 02/29/2024   ALT 18 02/29/2024   ALBUMIN 4.3 02/29/2024   ALKPHOS 54 02/29/2024     Electrolytes Lab Results  Component Value Date   NA 136 02/29/2024   K 3.8 02/29/2024   CL 102 02/29/2024   CALCIUM 8.8 (L) 02/29/2024    Bone No results found for: VD25OH, [id number], CI6874NY7, CI7874NY7, 25OHVITD1, 25OHVITD2, 25OHVITD3, TESTOFREE, TESTOSTERONE  Inflammation (CRP: Acute Phase) (ESR: Chronic Phase) Lab Results  Component Value Date   LATICACIDVEN 1.4 10/18/2020         Note: Above Lab results reviewed.  Recent Imaging Review  MM 3D SCREENING MAMMOGRAM UNILATERAL LEFT BREAST CLINICAL DATA:  Screening.  EXAM: DIGITAL SCREENING UNILATERAL LEFT MAMMOGRAM WITH CAD AND TOMOSYNTHESIS  TECHNIQUE: Left screening digital craniocaudal and mediolateral oblique mammograms were obtained. Left screening digital breast tomosynthesis was performed. The images were evaluated with computer-aided detection.  COMPARISON:  Previous exam(s).  ACR Breast Density Category b: There are scattered areas of fibroglandular density.  FINDINGS: The patient has had a right mastectomy. There are no findings suspicious for malignancy.  IMPRESSION: No mammographic evidence of malignancy. A result letter of this screening mammogram will be mailed directly to the patient.  RECOMMENDATION: Screening mammogram in one year.  (Code:SM-L-5M)  BI-RADS CATEGORY  1: Negative.  Electronically Signed   By: Dobrinka  Dimitrova M.D.   On: 10/12/2023 09:33 Note: Reviewed        Physical Exam  Vitals: BP (!) 132/90 (BP Location: Left Arm, Patient Position: Sitting, Cuff Size: Normal)   Pulse 81   Temp (!) 97.4 F (36.3 C) (Temporal)   Resp 18   Ht 5' 2 (1.575 m)   Wt 101 lb (45.8 kg)   SpO2 100%   BMI 18.47 kg/m  BMI: Estimated body mass index is 18.47 kg/m as calculated from the following:   Height as of this encounter: 5' 2 (1.575 m).   Weight as of this encounter: 101 lb (45.8 kg). Ideal: Ideal body weight: 50.1 kg (110 lb 7.2 oz) General appearance:  Well nourished, well developed, and well hydrated. In no apparent acute distress Mental status: Alert, oriented x 3 (person, place, & time)       Respiratory: No evidence of acute respiratory distress Eyes: PERLA  Musculoskeletal: Bilateral leg pain Assessment   Diagnosis Status  1. Small fiber neuropathy   2. Medication management   3. Chronic pain syndrome   4. Idiopathic progressive neuropathy   5. Idiopathic small fiber sensory neuropathy   6. Paresthesias   7. Bilateral foot pain    Controlled Controlled Controlled   Updated Problems: No problems updated.  Plan of Care  Problem-specific:  Assessment and Plan    Idiopathic small fiber neuropathy Chronic neuropathy with burning and numbness in extremities, exacerbated by prolonged standing and sitting. Previous lidocaine  infusions ineffective. Neurology consultations yielded no new treatments. Taxol  chemotherapy in 2022 contributed to symptoms. - Consult with Dr. Marcelino for alternative procedures or medications.  Chronic pain syndrome Pain secondary to idiopathic small fiber neuropathy. Managed with tramadol  and Lyrica , though she wishes to avoid these medications. Previous lidocaine  infusions provided temporary relief but are ineffective now.  Medication management: Patient's pain is controlled with tramadol  and Lyrica , will continue on current medication regimen.  Prescribing drug monitoring (PDMP) reviewed, findings consistent with the use of prescribed medication and no evidence of narcotic misuse or abuse.  Urine drug screening (UDS) up to date.  No side effects or adverse reaction reported to medication.  Schedule follow-up in 90 days for medication management.      Beth Wells has a current medication list which includes the following long-term medication(s): amlodipine, amphetamine -dextroamphetamine , trazodone, and pregabalin .  Pharmacotherapy (Medications Ordered): Meds ordered this encounter  Medications    traMADol  (ULTRAM -ER) 200 MG 24 hr tablet    Sig: Take 1 tablet (200 mg total) by mouth daily.    Dispense:  30 tablet    Refill:  2    For chronic pain syndrome   pregabalin  (LYRICA ) 100 MG capsule    Sig: Take 1 capsule (100 mg total) by mouth daily.    Dispense:  180 capsule    Refill:  1    Fill one day early if pharmacy is closed on scheduled refill date. May substitute for generic if available.   Orders:  No orders of the defined types were placed in this encounter.      Return in about 3 months (around 01/04/2025) for (F2F), (MM), Emmy Blanch NP.    Recent Visits Date Type Provider Dept  07/14/24 Office Visit Zayn Selley K, NP [facility]  Showing recent visits within past 90 days and meeting all other requirements Today's Visits Date Type Provider Dept  10/06/24 Office Visit Blanch,  Ketrick Matney K, NP [facility]  Showing today's visits and meeting all other requirements Future Appointments No visits were found meeting these conditions. Showing future appointments within next 90 days and meeting all other requirements  I discussed the assessment and treatment plan with the patient. The patient was provided an opportunity to ask questions and all were answered. The patient agreed with the plan and demonstrated an understanding of the instructions.  Patient advised to call back or seek an in-person evaluation if the symptoms or condition worsens.  I personally spent a total of 30 minutes in the care of the patient today including preparing to see the patient, getting/reviewing separately obtained history, performing a medically appropriate exam/evaluation, counseling and educating, placing orders, referring and communicating with other health care professionals, documenting clinical information in the EHR, independently interpreting results, communicating results, and coordinating care.   Note by: Diandra Cimini K Lela Murfin, NP (TTS and AI technology used. I apologize for  any typographical errors that were not detected and corrected.) Date: 10/06/2024; Time: 3:18 PM

## 2024-10-06 ENCOUNTER — Ambulatory Visit: Attending: Nurse Practitioner | Admitting: Nurse Practitioner

## 2024-10-06 ENCOUNTER — Encounter: Payer: Self-pay | Admitting: Nurse Practitioner

## 2024-10-06 VITALS — BP 132/90 | HR 81 | Temp 97.4°F | Resp 18 | Ht 62.0 in | Wt 101.0 lb

## 2024-10-06 DIAGNOSIS — M79672 Pain in left foot: Secondary | ICD-10-CM | POA: Diagnosis not present

## 2024-10-06 DIAGNOSIS — G603 Idiopathic progressive neuropathy: Secondary | ICD-10-CM | POA: Insufficient documentation

## 2024-10-06 DIAGNOSIS — G629 Polyneuropathy, unspecified: Secondary | ICD-10-CM | POA: Diagnosis not present

## 2024-10-06 DIAGNOSIS — Z79899 Other long term (current) drug therapy: Secondary | ICD-10-CM | POA: Insufficient documentation

## 2024-10-06 DIAGNOSIS — G894 Chronic pain syndrome: Secondary | ICD-10-CM | POA: Insufficient documentation

## 2024-10-06 DIAGNOSIS — R202 Paresthesia of skin: Secondary | ICD-10-CM | POA: Insufficient documentation

## 2024-10-06 DIAGNOSIS — M79671 Pain in right foot: Secondary | ICD-10-CM | POA: Insufficient documentation

## 2024-10-06 DIAGNOSIS — G608 Other hereditary and idiopathic neuropathies: Secondary | ICD-10-CM | POA: Diagnosis not present

## 2024-10-06 MED ORDER — PREGABALIN 100 MG PO CAPS: 100.0000 mg | ORAL_CAPSULE | Freq: Every day | ORAL | 1 refills | Status: AC

## 2024-10-06 MED ORDER — TRAMADOL HCL ER 200 MG PO TB24: 200.0000 mg | ORAL_TABLET | Freq: Every day | ORAL | 2 refills | Status: AC

## 2024-10-06 NOTE — Patient Instructions (Signed)

## 2024-10-06 NOTE — Progress Notes (Signed)
 Nursing Pain Medication Assessment:  Safety precautions to be maintained throughout the outpatient stay will include: orient to surroundings, keep bed in low position, maintain call bell within reach at all times, provide assistance with transfer out of bed and ambulation.  Medication Inspection Compliance: Pill count conducted under aseptic conditions, in front of the patient. Neither the pills nor the bottle was removed from the patient's sight at any time. Once count was completed pills were immediately returned to the patient in their original bottle.  Medication: Tramadol  (Ultram ) Pill/Patch Count: 25 of 30 pills/patches remain Pill/Patch Appearance: Markings consistent with prescribed medication Bottle Appearance: Standard pharmacy container. Clearly labeled. Filled Date: 01 / 06 / 2026 Last Medication intake:  Today

## 2024-10-10 ENCOUNTER — Ambulatory Visit
Admission: RE | Admit: 2024-10-10 | Discharge: 2024-10-10 | Disposition: A | Discharge status: Home / Self Care | Source: Ambulatory Visit | Attending: General Surgery | Admitting: General Surgery

## 2024-10-10 DIAGNOSIS — Z1231 Encounter for screening mammogram for malignant neoplasm of breast: Secondary | ICD-10-CM | POA: Insufficient documentation

## 2024-10-30 ENCOUNTER — Telehealth: Payer: Self-pay | Admitting: Student in an Organized Health Care Education/Training Program

## 2024-10-30 NOTE — Telephone Encounter (Signed)
Pt needs prior auth for tramadol

## 2024-10-30 NOTE — Telephone Encounter (Signed)
 Medication is now approved. LP

## 2024-12-31 ENCOUNTER — Encounter: Admitting: Nurse Practitioner
